# Patient Record
Sex: Male | Born: 1960
Health system: Southern US, Community
[De-identification: ages and names within clinical notes are randomized; demographics above are authoritative.]

## PROBLEM LIST (undated history)

## (undated) DIAGNOSIS — R7303 Prediabetes: Secondary | ICD-10-CM

## (undated) DIAGNOSIS — G4730 Sleep apnea, unspecified: Secondary | ICD-10-CM

## (undated) DIAGNOSIS — K219 Gastro-esophageal reflux disease without esophagitis: Secondary | ICD-10-CM

## (undated) DIAGNOSIS — Z9889 Other specified postprocedural states: Secondary | ICD-10-CM

## (undated) DIAGNOSIS — E785 Hyperlipidemia, unspecified: Secondary | ICD-10-CM

## (undated) DIAGNOSIS — R600 Localized edema: Secondary | ICD-10-CM

## (undated) DIAGNOSIS — I4892 Unspecified atrial flutter: Secondary | ICD-10-CM

## (undated) DIAGNOSIS — F419 Anxiety disorder, unspecified: Secondary | ICD-10-CM

## (undated) DIAGNOSIS — Z8679 Personal history of other diseases of the circulatory system: Secondary | ICD-10-CM

## (undated) DIAGNOSIS — Z8774 Personal history of (corrected) congenital malformations of heart and circulatory system: Secondary | ICD-10-CM

## (undated) DIAGNOSIS — K5900 Constipation, unspecified: Secondary | ICD-10-CM

## (undated) DIAGNOSIS — E039 Hypothyroidism, unspecified: Secondary | ICD-10-CM

## (undated) DIAGNOSIS — E669 Obesity, unspecified: Secondary | ICD-10-CM

## (undated) DIAGNOSIS — I4819 Other persistent atrial fibrillation: Secondary | ICD-10-CM

## (undated) DIAGNOSIS — J310 Chronic rhinitis: Secondary | ICD-10-CM

## (undated) DIAGNOSIS — F329 Major depressive disorder, single episode, unspecified: Secondary | ICD-10-CM

## (undated) DIAGNOSIS — F32A Depression, unspecified: Secondary | ICD-10-CM

## (undated) DIAGNOSIS — K760 Fatty (change of) liver, not elsewhere classified: Secondary | ICD-10-CM

## (undated) HISTORY — PX: NASAL SINUS SURGERY: SHX719

## (undated) HISTORY — DX: Other persistent atrial fibrillation: I48.19

## (undated) HISTORY — DX: Sleep apnea, unspecified: G47.30

## (undated) HISTORY — DX: Unspecified atrial flutter: I48.92

## (undated) HISTORY — DX: Chronic rhinitis: J31.0

## (undated) HISTORY — DX: Gastro-esophageal reflux disease without esophagitis: K21.9

## (undated) HISTORY — DX: Major depressive disorder, single episode, unspecified: F32.9

## (undated) HISTORY — DX: Obesity, unspecified: E66.9

## (undated) HISTORY — DX: Anxiety disorder, unspecified: F41.9

## (undated) HISTORY — DX: Hyperlipidemia, unspecified: E78.5

## (undated) HISTORY — DX: Constipation, unspecified: K59.00

## (undated) HISTORY — DX: Hypothyroidism, unspecified: E03.9

## (undated) HISTORY — PX: VASECTOMY: SHX75

## (undated) HISTORY — DX: Depression, unspecified: F32.A

## (undated) HISTORY — PX: TONSILLECTOMY: SUR1361

## (undated) HISTORY — DX: Prediabetes: R73.03

## (undated) HISTORY — PX: WISDOM TOOTH EXTRACTION: SHX21

## (undated) HISTORY — DX: Localized edema: R60.0

## (undated) HISTORY — DX: Fatty (change of) liver, not elsewhere classified: K76.0

---

## 2002-07-25 ENCOUNTER — Encounter: Payer: Self-pay | Admitting: Internal Medicine

## 2002-07-25 ENCOUNTER — Encounter: Admission: RE | Admit: 2002-07-25 | Discharge: 2002-07-25 | Payer: Self-pay | Admitting: Internal Medicine

## 2004-07-15 ENCOUNTER — Ambulatory Visit: Payer: Self-pay | Admitting: Otolaryngology

## 2004-10-19 ENCOUNTER — Ambulatory Visit: Payer: Self-pay | Admitting: Internal Medicine

## 2005-01-20 ENCOUNTER — Ambulatory Visit: Payer: Self-pay | Admitting: Internal Medicine

## 2005-07-22 ENCOUNTER — Ambulatory Visit: Payer: Self-pay | Admitting: Internal Medicine

## 2005-08-30 ENCOUNTER — Ambulatory Visit: Payer: Self-pay | Admitting: Gastroenterology

## 2005-09-10 ENCOUNTER — Ambulatory Visit: Payer: Self-pay | Admitting: Gastroenterology

## 2005-09-10 LAB — HM COLONOSCOPY

## 2006-01-20 ENCOUNTER — Ambulatory Visit: Payer: Self-pay | Admitting: Internal Medicine

## 2006-07-01 ENCOUNTER — Ambulatory Visit: Payer: Self-pay | Admitting: Internal Medicine

## 2006-07-01 ENCOUNTER — Inpatient Hospital Stay (HOSPITAL_COMMUNITY): Admission: EM | Admit: 2006-07-01 | Discharge: 2006-07-02 | Payer: Self-pay | Admitting: Emergency Medicine

## 2006-07-06 ENCOUNTER — Other Ambulatory Visit (HOSPITAL_COMMUNITY): Admission: RE | Admit: 2006-07-06 | Discharge: 2006-10-04 | Payer: Self-pay | Admitting: Psychiatry

## 2006-07-06 ENCOUNTER — Ambulatory Visit: Payer: Self-pay | Admitting: Psychiatry

## 2006-07-18 ENCOUNTER — Ambulatory Visit: Payer: Self-pay | Admitting: Internal Medicine

## 2007-02-21 ENCOUNTER — Encounter (INDEPENDENT_AMBULATORY_CARE_PROVIDER_SITE_OTHER): Payer: Self-pay | Admitting: *Deleted

## 2007-04-25 ENCOUNTER — Ambulatory Visit: Payer: Self-pay | Admitting: Internal Medicine

## 2007-04-25 DIAGNOSIS — Z8659 Personal history of other mental and behavioral disorders: Secondary | ICD-10-CM

## 2007-04-25 DIAGNOSIS — E785 Hyperlipidemia, unspecified: Secondary | ICD-10-CM

## 2007-04-25 DIAGNOSIS — M109 Gout, unspecified: Secondary | ICD-10-CM

## 2007-04-25 DIAGNOSIS — E039 Hypothyroidism, unspecified: Secondary | ICD-10-CM

## 2007-08-09 ENCOUNTER — Ambulatory Visit: Payer: Self-pay | Admitting: Internal Medicine

## 2007-08-09 DIAGNOSIS — E049 Nontoxic goiter, unspecified: Secondary | ICD-10-CM | POA: Insufficient documentation

## 2007-08-28 ENCOUNTER — Encounter: Admission: RE | Admit: 2007-08-28 | Discharge: 2007-08-28 | Payer: Self-pay | Admitting: Internal Medicine

## 2007-10-13 ENCOUNTER — Telehealth (INDEPENDENT_AMBULATORY_CARE_PROVIDER_SITE_OTHER): Payer: Self-pay | Admitting: *Deleted

## 2008-10-11 ENCOUNTER — Ambulatory Visit: Payer: Self-pay | Admitting: Internal Medicine

## 2008-10-15 ENCOUNTER — Telehealth (INDEPENDENT_AMBULATORY_CARE_PROVIDER_SITE_OTHER): Payer: Self-pay | Admitting: *Deleted

## 2008-10-16 ENCOUNTER — Encounter: Payer: Self-pay | Admitting: Internal Medicine

## 2008-10-18 ENCOUNTER — Encounter: Payer: Self-pay | Admitting: Internal Medicine

## 2008-10-18 LAB — CONVERTED CEMR LAB
ALT: 67 units/L
AST: 38 units/L
Albumin: 5.1 g/dL
Alkaline Phosphatase: 84 units/L
BUN: 19 mg/dL
Calcium: 10.1 mg/dL
Chloride, Serum: 101 mmol/L
Cholesterol: 232 mg/dL
Creatinine, Ser: 0.99 mg/dL
Glucose, Bld: 116 mg/dL
HDL: 42 mg/dL
LDL Cholesterol: 140 mg/dL
Potassium, serum: 4.5 mmol/L
Sodium, serum: 140 mmol/L
TSH: 18.91 microintl units/mL
Total Bilirubin: 0.8 mg/dL
Total Protein: 7.1 g/dL
Triglycerides: 251 mg/dL
Uric Acid, Serum: 7.5 mg/dL

## 2008-10-21 ENCOUNTER — Encounter: Payer: Self-pay | Admitting: Internal Medicine

## 2009-03-28 ENCOUNTER — Telehealth (INDEPENDENT_AMBULATORY_CARE_PROVIDER_SITE_OTHER): Payer: Self-pay | Admitting: *Deleted

## 2009-03-28 DIAGNOSIS — E119 Type 2 diabetes mellitus without complications: Secondary | ICD-10-CM

## 2009-04-01 ENCOUNTER — Encounter: Payer: Self-pay | Admitting: Internal Medicine

## 2009-04-04 ENCOUNTER — Encounter: Payer: Self-pay | Admitting: Internal Medicine

## 2009-04-08 ENCOUNTER — Ambulatory Visit: Payer: Self-pay | Admitting: Internal Medicine

## 2009-04-08 DIAGNOSIS — K219 Gastro-esophageal reflux disease without esophagitis: Secondary | ICD-10-CM

## 2009-04-11 ENCOUNTER — Encounter: Payer: Self-pay | Admitting: Internal Medicine

## 2009-04-21 ENCOUNTER — Encounter: Payer: Self-pay | Admitting: Internal Medicine

## 2010-04-28 ENCOUNTER — Ambulatory Visit: Payer: Self-pay | Admitting: Internal Medicine

## 2010-06-04 ENCOUNTER — Telehealth: Payer: Self-pay | Admitting: Internal Medicine

## 2010-06-05 ENCOUNTER — Encounter: Payer: Self-pay | Admitting: Internal Medicine

## 2010-06-15 ENCOUNTER — Telehealth: Payer: Self-pay | Admitting: Internal Medicine

## 2010-06-26 ENCOUNTER — Encounter: Payer: Self-pay | Admitting: Internal Medicine

## 2010-06-26 LAB — CONVERTED CEMR LAB
ALT: 31 units/L
AST: 16 units/L
Hgb A1c MFr Bld: 10.6 %
Potassium, serum: 3.7 mmol/L
Triglycerides: 936 mg/dL

## 2010-06-28 LAB — CONVERTED CEMR LAB
AST: 30 units/L
BUN: 16 mg/dL
Calcium: 9.9 mg/dL
Cholesterol: 223 mg/dL
HCT: 45.3 %
Hgb A1c MFr Bld: 7.5 %
LDL Cholesterol: 118 mg/dL
MCV: 88 fL
Platelets: 185 10*3/uL
Sodium, serum: 139 mmol/L
TSH: 5.97 microintl units/mL
Total Protein: 7 g/dL
Triglycerides: 333 mg/dL

## 2010-06-30 NOTE — Assessment & Plan Note (Signed)
Summary: cpx//lch- resd cbs   Vital Signs:  Patient profile:   50 year old male Height:      73.5 inches Weight:      258.13 pounds BMI:     33.72 Pulse rate:   92 / minute Pulse rhythm:   regular BP sitting:   124 / 84  (left arm) Cuff size:   large  Vitals Entered By: [name] (April 28, 2010 9:22 AM) CC: CPX, not fasting  Comments [facility] [address]   History of Present Illness: CPX    Preventive Screening-Counseling & Management  Caffeine-Diet-Exercise     Does Patient Exercise: yes     Type of exercise: some  Current Medications (verified): 1)  Levothyroxine Sodium 200 Mcg Solr (Levothyroxine Sodium) .[name].. 1 By Mouth Once Daily 2)  Allopurinol 300 Mg Tabs (Allopurinol) .[name].. 1 By Mouth Qd 3)  Nexium 40 Mg Cpdr (Esomeprazole Magnesium) .[name].. 1 By Mouth Once Daily 4)  Flonase 50 Mcg/act Susp (Fluticasone Propionate) .... 2 Puffs Once Daily  Allergies (verified): No Known Drug Allergies  Past History:  Past Medical History: dx w/  BIPOLAR DISORDER at some point , used to see psych, on no meds as of 03-2009 RHINITIS--vasomotor HYPOTHYROIDISM  HYPERLIPIDEMIA   GOUT   GERD  Past Surgical History: Reviewed history from 04/25/2007 and no changes required. Tonsillectomy Vasectomy sinus surgery  Family History: Reviewed history from 08/09/2007 and no changes required. CAD - no DM - F, F family HTN  - bro stroke - no colon Ca - PGF prostate Ca - no melanoma - bro throat Ca - M. Uncle asthma/allergies - daughter  Social History: Married 2 kids works at [employer] unchanged, needs improvment tobacco-- never ETOH-- never Does Patient Exercise:  yes  Review of Systems General:  some fatigue, hair thinner, nails not growing as fast: check testosterone? admits to a lot of stress at work but still enjoys going to work. CV:  Denies chest pain or discomfort and swelling of feet. Resp:  Denies shortness of breath; occasionally cough,  thinks related to GERD -PN drip . GI:  Denies bloody stools, nausea, and vomiting; GERD symptoms ok , they are the best when he loss wt. GU:  Denies dysuria, hematuria, urinary frequency, and urinary hesitancy; no ED. Psych:  Denies anxiety and depression. Endo:  Denies excessive hunger, excessive thirst, and excessive urination.  Physical Exam  General:  alert, well-developed, and overweight-appearing.   Neck:  no masses, R  thyrod slt larger?, no nodules, and normal carotid upstroke.   Lungs:  normal respiratory effort, no intercostal retractions, no accessory muscle use, and normal breath sounds.   Heart:  normal rate, regular rhythm, and no murmur.   Abdomen:  soft, non-tender, no distention, no masses, no guarding, and no rigidity.   Rectal:  + ext henmorrhoid x 2,  Normal sphincter tone. No rectal masses or tenderness. Prostate:  Prostate gland firm and smooth, no enlargement, nodularity, tenderness, mass; slightly  larger on the L? Extremities:  no pretibial edema bilaterally  Psych:  Oriented X3, memory intact for recent and remote, normally interactive, good eye contact, not anxious appearing, and not depressed appearing.     Impression & Recommendations:  Problem # 1:  HEALTH SCREENING (ICD-V70.0) TD 09  got a flu shot  Cscope 08-2005: int.  hemorrhoids, next 2017 I will rec iBOBs starting 2012  diet -exercise diiscussed and encourage prostate slightly asymmetric? Recommend to come back in 6 months to recheck.  PSA pending. Labs likes a testosterone check, will do. Patient aware that a low testosterone level may not correlate with some of his concerns patient had some weight loss, we'll check his TSH and A1c  Problem # 2:  DIABETES MELLITUS, TYPE II (ICD-250.00) patient has diabetes will refer to nutritionist I recommended him to come back every 4 months   Labs Reviewed: Creat: 0.88 (04/01/2009)    Reviewed HgBA1c results: 7.5 (04/01/2009)  Orders: Nutrition  Referral (Nutrition)  Problem # 3:  HYPOTHYROIDISM (ICD-244.9) labs His updated medication list for this problem includes:    Levothyroxine Sodium 200 Mcg Solr (Levothyroxine sodium) .[name]... 1 by mouth once daily  Labs Reviewed: TSH: 5.970 (04/01/2009)    HgBA1c: 7.5 (04/01/2009) Chol: 223 (04/01/2009)   HDL: 38 (04/01/2009)   LDL: 118 (04/01/2009)   TG: 333 (04/01/2009)  Problem # 4:  HYPERLIPIDEMIA (ICD-272.4) labs Labs Reviewed: SGOT: 30 (04/01/2009)   SGPT: 35 (04/01/2009)   HDL:38 (04/01/2009), 42 (10/18/2008)  LDL:118 (04/01/2009), 140 (10/18/2008)  Chol:223 (04/01/2009), 232 (10/18/2008)  Trig:333 (04/01/2009), 251 (10/18/2008)  Complete Medication List: 1)  Levothyroxine Sodium 200 Mcg Solr (Levothyroxine sodium) .[name].. 1 by mouth once daily 2)  Allopurinol 300 Mg Tabs (Allopurinol) .[name].. 1 by mouth qd 3)  Nexium 40 Mg Cpdr (Esomeprazole magnesium) .[name].. 1 by mouth once daily 4)  Flonase 50 Mcg/act Susp (Fluticasone propionate) .... 2 puffs once daily 5)  Fasting Labs  .... Flp, lfts, hemoglobin a1c, microalbumin, bmp, tsh, psa, cbc, total testosterone dx v70  Patient Instructions: 1)  Please schedule a follow-up appointment in 4 months .  Prescriptions: FLONASE 50 MCG/ACT SUSP (FLUTICASONE PROPIONATE) 2 puffs once daily  #3 month x 1   Entered and Authorized by:   Elita Quick E. Paz MD   Signed by:   Nolon Rod. Paz MD on 04/28/2010   Method used:   Print then Give to Patient   RxID:   [id number] NEXIUM 40 MG CPDR (ESOMEPRAZOLE MAGNESIUM) 1 by mouth once daily  #90 x 1   Entered and Authorized by:   Elita Quick E. Paz MD   Signed by:   Nolon Rod. Paz MD on 04/28/2010   Method used:   Print then Give to Patient   RxID:   [id number] ALLOPURINOL 300 MG TABS (ALLOPURINOL) 1 by mouth QD  #90 x 1   Entered and Authorized by:   Nolon Rod. Paz MD   Signed by:   Nolon Rod. Paz MD on 04/28/2010   Method used:   Print then Give to Patient   RxID:   [id number] LEVOTHYROXINE SODIUM 200 MCG  SOLR (LEVOTHYROXINE SODIUM) 1 by mouth once daily  #90 x 1   Entered and Authorized by:   Elita Quick E. Paz MD   Signed by:   Nolon Rod. Paz MD on 04/28/2010   Method used:   Print then Give to Patient   RxID:   [id number] FASTING LABS FLP, LFTs, hemoglobin A1c, microalbumin, BMP, TSH, PSA, CBC, total testosterone dx v70  #0 x 0   Entered and Authorized by:   Nolon Rod. Paz MD   Signed by:   Nolon Rod. Paz MD on 04/28/2010   Method used:   Print then Give to Patient   RxID:   [id number]    Orders Added: 1)  Nutrition Referral [Nutrition] 2)  Est. Patient age 78-64 [99396]   Immunization History:  Influenza Immunization History:    Influenza:  historical (03/28/2010)   Immunization History:  Influenza Immunization History:  Influenza:  Historical (03/28/2010)   Risk Factors:  Exercise:  yes    Type:  some

## 2010-07-02 NOTE — Progress Notes (Signed)
Summary: Mail order running late  Phone Note Refill Request Message from:  Patient on June 15, 2010 3:30 PM  Refills Requested: Medication #1:  ALLOPURINOL 300 MG TABS 1 by mouth QD  Medication #2:  LEVOTHYROXINE SODIUM 200 MCG SOLR 1 by mouth once daily Patient notes that he is about to run out and mail order will not arrive.  Initial call taken by: [name],  June 15, 2010 3:30 PM    Prescriptions: ALLOPURINOL 300 MG TABS (ALLOPURINOL) 1 by mouth QD  #30 x 0   Entered by:   [name]   Authorized by:   Nolon Rod. Whitlee Sluder MD   Signed by:   [name] on 06/15/2010   Method used:   Electronically to        [facility] [phone]* (retail)       [address]       [place], [place]  [address]       Ph: [phone]       Fax: [phone]   RxID:   [id number] LEVOTHYROXINE SODIUM 200 MCG SOLR (LEVOTHYROXINE SODIUM) 1 by mouth once daily  #30 x 0   Entered by:   [name]   Authorized by:   Nolon Rod. Radiah Lubinski MD   Signed by:   [name] on 06/15/2010   Method used:   Electronically to        [facility] [phone]* (retail)       [address]       [place], [place]  [address]       Ph: [phone]       Fax: [phone]   RxID:   [id number]

## 2010-07-02 NOTE — Progress Notes (Signed)
Summary: needs labs   Phone Note Other Incoming   Summary of Call: I left message for pt to call back-- he needs to have an FLP, BMP, PSA, CBC.  Initial call taken by: [name],  June 15, 2010 4:35 PM  Follow-up for Phone Call        left message for pt to call back. [name]  June 16, 2010 10:37 AM   Additional Follow-up for Phone Call Additional follow up Details #1::        Pt would like to know if a Vitamin D could be added?  Additional Follow-up by: [name],  June 16, 2010 2:33 PM    Additional Follow-up for Phone Call Additional follow up Details #2::    yes. Afia Messenger E. Amandy Chubbuck MD  June 16, 2010 3:09 PM   Additional Follow-up for Phone Call Additional follow up Details #3:: Details for Additional Follow-up Action Taken: pt is aware that rx is upfront. [name]  June 16, 2010 3:58 PM   New/Updated Medications: * FASTING LABS FLP, BMP, PSA, CBC, Vitamin D-- v70.0 Prescriptions: FASTING LABS FLP, BMP, PSA, CBC, Vitamin D-- v70.0  #0 x 0   Entered by:   [name]   Authorized by:   Nolon Rod. Lauraine Crespo MD   Signed by:   [name] on 06/16/2010   Method used:   Print then Give to Patient   RxID:   [id number]

## 2010-07-02 NOTE — Progress Notes (Signed)
Summary: NEED LABS  Phone Note Outgoing Call   Summary of Call: had CPX 04-28-10, was supposed to send me his labs but we have not received them. advise patient: needs labs a Rx 04-28-10 Sanyla Summey E. Elen Acero MD  June 04, 2010 8:37 PM   Follow-up for Phone Call        I spoke w/ pt he is going to send them to [place]. [name]  June 05, 2010 9:28 AM

## 2010-07-09 ENCOUNTER — Encounter: Payer: Self-pay | Admitting: Internal Medicine

## 2010-07-09 ENCOUNTER — Telehealth: Payer: Self-pay | Admitting: Internal Medicine

## 2010-07-09 LAB — CONVERTED CEMR LAB: Vit D, 25-Hydroxy: 10.3 ng/mL

## 2010-07-10 ENCOUNTER — Telehealth: Payer: Self-pay | Admitting: Internal Medicine

## 2010-07-11 ENCOUNTER — Encounter: Payer: Self-pay | Admitting: Internal Medicine

## 2010-07-16 NOTE — Progress Notes (Signed)
Summary: start metformin, Amaryl, Antara  Phone Note Outgoing Call   Summary of Call: advise patient: His diabetes is much worse than before, the hemoglobin A1c is 10.6. He is triglycerides are extremely high at 956. TSH and testosterone slightly abnormal====>  recheck in few  months Plan: See the nutritionist as recommended Start Antara 1 by mouth once daily (for  triglycerides), #30 1 RF, we can give him samples and a coupon start Amaryl 2  mg one p.o. daily, #30 and 1 RF metformin 500 mg one p.o. b.i.d., #60 and one refill came  back to the office to get a glucometer, check blood sugars once daily at different times. office visit with me in one month Jovi Alvizo E. Jaque Dacy MD  July 09, 2010 3:45 PM    Follow-up for Phone Call        I spoke w/ pt he is aware of everything. Has a pending appt. Place samples and glucometer upfront for him to pick up and copy of labs. [name]  July 09, 2010 4:02 PM     New/Updated Medications: LEVOTHYROXINE SODIUM 200 MCG SOLR 1 by mouth once daily, ALLOPURINOL 300 MG TABS 1 by mouth QD, NEXIUM 40 MG CPDR 1 by mouth once daily, FLONASE 50 MCG/ACT SUSP 2 puffs once daily, FASTING LABS FLP, BMP, PSA, CBC, Vitamin D-- v70.0, AMARYL 2 MG TABS 1 by mouth daily., METFORMIN HCL 500 MG TABS 1 by mouth two times a day., ANTARA 130 MG CAPS 1 by mouth daily., ONETOUCH ULTRA BLUE  STRP once daily., ONETOUCH ULTRASOFT LANCETS  MISC once daily.[name]  LEVOTHYROXINE SODIUM 125 MCG TABS (LEVOTHYROXINE SODIUM) 1 by mouth QD UNITHROID 125 MCG  TABS (LEVOTHYROXINE SODIUM) 1 by mouth qd LEVOTHYROXINE SODIUM 200 MCG SOLR (LEVOTHYROXINE SODIUM) 1 by mouth once daily LEVOTHYROXINE SODIUM 175 MCG TABS (LEVOTHYROXINE SODIUM) 1 by mouth once daily GENTAMICIN SULFATE 0.3 % OINT (GENTAMICIN SULFATE)  GENTAMICIN SULFATE 0.3 % OINT (GENTAMICIN SULFATE)  ALLOPURINOL 300 MG TABS (ALLOPURINOL) 1 by mouth QD DEPAKOTE ER 500 MG  TB24 (DIVALPROEX SODIUM) 6 by mouth  qhs DEPAKOTE ER 500 MG  TB24 (DIVALPROEX SODIUM) 6 by mouth qhs LAMICTAL 100 MG  TABS (LAMOTRIGINE) 1 by mouth qhs LAMICTAL 100 MG  TABS (LAMOTRIGINE) 1 by mouth qhs * JOINT SOOTHER VITAMIN  * JOINT SOOTHER VITAMIN  AMOXICILLIN 500 MG  TABS (AMOXICILLIN) 2 by mouth two times a day x 7 days AMOXICILLIN 500 MG  TABS (AMOXICILLIN) 2 by mouth two times a day x 7 days * LAB WORK BMP, LFT, URIC ACID, LIPID, TSH DX V70.0, 244.9, 274.9 * LAB WORK BMP, LFT, URIC ACID, LIPID, TSH DX V70.0, 244.9, 274.9 ACIPHEX 20 MG TBEC (RABEPRAZOLE SODIUM) 1 by mouth daily before breakfast NEXIUM 40 MG CPDR (ESOMEPRAZOLE MAGNESIUM) 1 by mouth once daily FLONASE 50 MCG/ACT SUSP (FLUTICASONE PROPIONATE) 2 puffs once daily AMOXICILLIN 500 MG CAPS (AMOXICILLIN) 2 by mouth two times a day AMOXICILLIN 500 MG CAPS (AMOXICILLIN) 2 by mouth two times a day * TSH CHECK IN 6 WEEKS Dx hypothyroidism * TSH CHECK IN 6 WEEKS Dx hypothyroidism * FASTING LABS FLP, LFTs, hemoglobin A1c, microalbumin, BMP, TSH, PSA, CBC, total testosterone dx v70 * FASTING LABS FLP, BMP, PSA, CBC, Vitamin D-- v70.0 AMARYL 2 MG TABS (GLIMEPIRIDE) 1 by mouth daily. METFORMIN HCL 500 MG TABS (METFORMIN HCL) 1 by mouth two times a day. ANTARA 130 MG CAPS (FENOFIBRATE MICRONIZED) 1 by mouth daily. ONETOUCH ULTRA BLUE  STRP (GLUCOSE  BLOOD) once daily. ONETOUCH ULTRASOFT LANCETS  MISC (LANCETS) once daily. Prescriptions: ONETOUCH ULTRASOFT LANCETS  MISC (LANCETS) once daily.  #1 month x 11   Entered by:   [name]   Authorized by:   Nolon Rod. Shacara Cozine MD   Signed by:   [name] on 07/09/2010   Method used:   Electronically to        [facility] [phone]* (retail)       [address]       [place], [place]  [address]       Ph: [phone]       Fax: [phone]   RxID:   [id number] ONETOUCH ULTRA BLUE  STRP (GLUCOSE BLOOD) once daily.  #1 month x 11   Entered by:   [name]   Authorized  by:   Nolon Rod. Kingsley Herandez MD   Signed by:   [name] on 07/09/2010   Method used:   Electronically to        [facility] [phone]* (retail)       [address]       [place], [place]  [address]       Ph: [phone]       Fax: [phone]   RxID:   [id number] ANTARA 130 MG CAPS (FENOFIBRATE MICRONIZED) 1 by mouth daily.  #30 x 1   Entered by:   [name]   Authorized by:   Nolon Rod. Montia Haslip MD   Signed by:   [name] on 07/09/2010   Method used:   Electronically to        [facility] [phone]* (retail)       [address]       [place], [place]  [address]       Ph: [phone]       Fax: [phone]   RxID:   [id number] METFORMIN HCL 500 MG TABS (METFORMIN HCL) 1 by mouth two times a day.  #60 x 1   Entered by:   [name]   Authorized by:   Nolon Rod. Gaynelle Pastrana MD   Signed by:   [name] on 07/09/2010   Method used:   Electronically to        [facility] [phone]* (retail)       [address]       [place], [place]  [address]       Ph: [phone]       Fax: [phone]   RxID:   [id number] AMARYL 2 MG TABS (GLIMEPIRIDE) 1 by mouth daily.  #30 x 1   Entered by:   [name]   Authorized by:   Nolon Rod. Jaidence Geisler MD   Signed by:   [name] on 07/09/2010   Method used:   Electronically to        [facility]  [facility] [phone]* (retail)       [address]       [place], [place]  [address]       Ph: [phone]       Fax: [phone]   RxID:   [id number]

## 2010-07-16 NOTE — Progress Notes (Signed)
Summary: Prior Auth--Antara  Phone Note Refill Request Call back at [phone] Message from:  Pharmacy on July 10, 2010 9:35 AM  Refills Requested: Medication #1:  ANTARA 130 MG CAPS 1 by mouth daily.   Dosage confirmed as above?Dosage Confirmed [facility] [address]  Initial call taken by: Harold Barban,  July 10, 2010 9:35 AM  Follow-up for Phone Call        Ph # [phone] option 3. I spoke with insurance company and preferred med is generic fenofibrate. Patient must have tried that med prior to initiating prior authorization. Please advise. [name]  July 10, 2010 10:31 AM   Additional Follow-up for Phone Call Additional follow up Details #1::        okay, I deleted antara and put a new prescription for fenofibrate. let patient know Marisela Line E. Travaris Kosh MD  July 10, 2010 10:41 AM   Patient spouse notified. [name]  July 10, 2010 10:48 AM     New/Updated Medications: FENOFIBRATE 160 MG TABS (FENOFIBRATE) 1 by mouth once daily Prescriptions: FENOFIBRATE 160 MG TABS (FENOFIBRATE) 1 by mouth once daily  #90 x 1   Entered and Authorized by:   Nolon Rod. Rosbel Buckner MD   Signed by:   [name] on 07/10/2010   Method used:   Electronically to        [facility] [phone]* (retail)       [address]       [place], [place]  [address]       Ph: [phone]       Fax: [phone]   RxID:   [id number]

## 2010-07-29 ENCOUNTER — Telehealth (INDEPENDENT_AMBULATORY_CARE_PROVIDER_SITE_OTHER): Payer: Self-pay | Admitting: *Deleted

## 2010-07-30 ENCOUNTER — Telehealth (INDEPENDENT_AMBULATORY_CARE_PROVIDER_SITE_OTHER): Payer: Self-pay | Admitting: *Deleted

## 2010-08-03 ENCOUNTER — Encounter: Payer: 59 | Attending: Internal Medicine

## 2010-08-03 DIAGNOSIS — E119 Type 2 diabetes mellitus without complications: Secondary | ICD-10-CM | POA: Insufficient documentation

## 2010-08-03 DIAGNOSIS — Z713 Dietary counseling and surveillance: Secondary | ICD-10-CM | POA: Insufficient documentation

## 2010-08-05 ENCOUNTER — Ambulatory Visit (INDEPENDENT_AMBULATORY_CARE_PROVIDER_SITE_OTHER): Payer: 59 | Admitting: Internal Medicine

## 2010-08-05 ENCOUNTER — Encounter: Payer: Self-pay | Admitting: Internal Medicine

## 2010-08-05 DIAGNOSIS — E785 Hyperlipidemia, unspecified: Secondary | ICD-10-CM

## 2010-08-05 DIAGNOSIS — E119 Type 2 diabetes mellitus without complications: Secondary | ICD-10-CM

## 2010-08-05 DIAGNOSIS — E039 Hypothyroidism, unspecified: Secondary | ICD-10-CM

## 2010-08-06 NOTE — Progress Notes (Signed)
Summary: 90 day for meds   Phone Note Refill Request Message from:  Patient on July 30, 2010 8:55 AM  Refills Requested: Medication #1:  AMARYL 2 MG TABS 1 by mouth daily.  Medication #2:  FENOFIBRATE 160 MG TABS 1 by mouth once daily.  Medication #3:  METFORMIN HCL 500 MG TABS 1 by mouth two times a day. needs 90-day supply and will stop by to pick up prescriptions  Initial call taken by: Doristine Devoid CMA,  July 30, 2010 8:56 AM    Prescriptions: FENOFIBRATE 160 MG TABS (FENOFIBRATE) 1 by mouth once daily  #90 x 1   Entered by:   Doristine Devoid CMA   Authorized by:   Nolon Rod. Paz MD   Signed by:   Doristine Devoid CMA on 07/30/2010   Method used:   Print then Give to Patient   RxID:   [id number] METFORMIN HCL 500 MG TABS (METFORMIN HCL) 1 by mouth two times a day.  #180 x 0   Entered by:   Doristine Devoid CMA   Authorized by:   Nolon Rod. Paz MD   Signed by:   Doristine Devoid CMA on 07/30/2010   Method used:   Print then Give to Patient   RxID:   [id number] AMARYL 2 MG TABS (GLIMEPIRIDE) 1 by mouth daily.  #90 x 0   Entered by:   Doristine Devoid CMA   Authorized by:   Nolon Rod. Paz MD   Signed by:   Doristine Devoid CMA on 07/30/2010   Method used:   Print then Give to Patient   RxID:   [id number]

## 2010-08-06 NOTE — Progress Notes (Signed)
Summary: rx (lmom 2/29)   Phone Note Refill Request   Refills Requested: Medication #1:  antara 130 mg take 1 tablet daily by mouth   Dosage confirmed as above?Dosage Confirmed   Supply Requested: 3 months  Medication #2:  METFORMIN HCL 500 MG TABS 1 by mouth two times a day.   Dosage confirmed as above?Dosage Confirmed   Supply Requested: 3 months  Medication #3:  glimepiride 2 mg   Dosage confirmed as above?Dosage Confirmed   Supply Requested: 3 months  Medication #4:  FENOFIBRATE 160 MG TABS 1 by mouth once daily.   Dosage confirmed as above?Dosage Confirmed   Supply Requested: 3 months oneetouch ultra blood glucose test strips-- Need a 30 day supply now and then a 90 day suppy. patient will pick up.   Follow-up for Phone Call        Left message for pt to call back-- need to clairfy meds- pt should not be on both antara and fenofibrate. [name]  July 29, 2010 8:40 AM   Additional Follow-up for Phone Call Additional follow up Details #1::        Patient called back stating that he has been taking both of the above meds. [name] is not on med list, but patient says it was given by Dr. Drue Novel. [name]  July 29, 2010 8:46 AM     Additional Follow-up for Phone Call Additional follow up Details #2::    I spoke w/ pt made pt aware of Insurance company not covering his Anatara, sent in Meformin and Amarly for 3 months. Made pt aware Dr.Paz wants to see him in the next week or 2. [name]  July 29, 2010 8:50 AM   Prescriptions: Letta Pate ULTRASOFT LANCETS  MISC (LANCETS) once daily.  #1 month x 10   Entered by:   [name]   Authorized by:   Nolon Rod. Paz MD   Signed by:   [name] on 07/29/2010   Method used:   Electronically to        [facility] [phone]* (retail)       [address]       [place], [place]  [address]       Ph: [phone]       Fax: [phone]   RxID:    [id number] ONETOUCH ULTRA BLUE  STRP (GLUCOSE BLOOD) once daily.  #1 month x 10   Entered by:   [name]   Authorized by:   Nolon Rod. Paz MD   Signed by:   [name] on 07/29/2010   Method used:   Electronically to        [facility]S  [facility] [phone]* (retail)       [address]       [place], [place]  [address]       Ph: [phone]       Fax: [phone]   RxID:   [id number] METFORMIN HCL 500 MG TABS (METFORMIN HCL) 1 by mouth two times a day.  #180 x 0   Entered by:   [name]   Authorized by:   Nolon Rod. Paz MD   Signed by:   [name] on 07/29/2010   Method used:   Electronically to        [facility]S  [facility] [phone]* (retail)  [address]       [place], [place]  [address]       Ph: [phone]       Fax: [phone]   RxID:   [id number] AMARYL 2 MG TABS (GLIMEPIRIDE) 1 by mouth daily.  #90 x 0   Entered by:   [name]   Authorized by:   Nolon Rod. Paz MD   Signed by:   [name] on 07/29/2010   Method used:   Electronically to        [facility]S  [facility] [phone]* (retail)       [address]       [place], [place]  [address]       Ph: [phone]       Fax: [phone]   RxID:   [id number]

## 2010-08-11 NOTE — Assessment & Plan Note (Signed)
Summary: 4 MONTH ROV/KN   Vital Signs:  Patient profile:   50 year old male Weight:      263.25 pounds Pulse rate:   75 / minute Pulse rhythm:   regular BP sitting:   116 / 88  (left arm) Cuff size:   large  Vitals Entered By: [name] (August 05, 2010 10:20 AM) CC: 4 month f/u on DM Comments BS running high  not fasting  [facility]    History of Present Illness:   here for a routine visit  recent labs reviewed    ROS He went to the first diabetes class 3 days ago,  it has helped a lot.  good compliance with medication.  his CBGs remain in the 200 except for the last couple of days when she started changing his diet , CBGs has been around 90.  denies nausea, vomiting, diarrhea   good thyroid medication compliance  Current Medications (verified): 1)  Levothyroxine Sodium 200 Mcg Solr (Levothyroxine Sodium) .[name].. 1 By Mouth Once Daily 2)  Allopurinol 300 Mg Tabs (Allopurinol) .[name].. 1 By Mouth Qd 3)  Nexium 40 Mg Cpdr (Esomeprazole Magnesium) .[name].. 1 By Mouth Once Daily 4)  Flonase 50 Mcg/act Susp (Fluticasone Propionate) .... 2 Puffs Once Daily 5)  Fasting Labs .... Flp, Bmp, Psa, Cbc, Vitamin D-- V70.0 6)  Amaryl 2 Mg Tabs (Glimepiride) .[name].. 1 By Mouth Daily. 7)  Metformin Hcl 500 Mg Tabs (Metformin Hcl) .[name].. 1 By Mouth Two Times A Day. 8)  Onetouch Ultra Blue  Strp (Glucose Blood) .... Once Daily. 9)  Onetouch Ultrasoft Lancets  Misc (Lancets) .... Once Daily. 10)  Fenofibrate 160 Mg Tabs (Fenofibrate) .[name].. 1 By Mouth Once Daily  Allergies (verified): No Known Drug Allergies  Past History:  Past Medical History: Reviewed history from 04/28/2010 and no changes required. dx w/  BIPOLAR DISORDER at some point , used to see psych, on no meds as of 03-2009 RHINITIS--vasomotor HYPOTHYROIDISM  HYPERLIPIDEMIA   GOUT   GERD  Past Surgical History: Reviewed history from 04/25/2007 and no changes required. Tonsillectomy Vasectomy sinus surgery  Social  History: Reviewed history from 04/28/2010 and no changes required. Married 2 kids works at [employer] unchanged, needs improvment tobacco-- never ETOH-- never   Physical Exam  General:  alert and well-developed.   Psych:  Oriented X3, memory intact for recent and remote, normally interactive, good eye contact, not anxious appearing, and not depressed appearing.     Impression & Recommendations:  Problem # 1:  DIABETES MELLITUS, TYPE II (ICD-250.00)  last hemoglobin A1c was 10.6 Since then he has  started the medication and in the last 2 days has improved his diet Encouraged to continue his healthier  lifestyle  We'll continue with the same medications but patient will cut amaryl to 1mg  qd  if his blood sugar is less than 90 consistently. He may even have to stop Amaryl altogether.  Patient understood the plan.  we'll check the A1c in April  Followup in 3 months His updated medication list for this problem includes:    Amaryl 2 Mg Tabs (Glimepiride) .[name]... 1 by mouth daily.    Metformin Hcl 500 Mg Tabs (Metformin hcl) .[name]... 1 by mouth two times a day.  Labs Reviewed: Creat: 0.78 (06/26/2010)    Reviewed HgBA1c results: 10.6 (06/26/2010)  7.5 (04/01/2009)  Problem # 2:  HYPOTHYROIDISM (ICD-244.9)  last TSH elevated to 4.5  Good medication compliance Recheck labs , see prescriptions  His updated medication list for this problem includes:    Levothyroxine Sodium 200 Mcg Solr (Levothyroxine sodium) .[name]... 1 by mouth once daily  Problem # 3:  HYPERLIPIDEMIA (ICD-272.4)  last cholesterol panel show a triglyceride of 936. She is status and a fibroid 3 weeks ago and is changing his diet Labs in April Follow up in 3 months His updated medication list for this problem includes:    Fenofibrate 160 Mg Tabs (Fenofibrate) .[name]... 1 by mouth once daily  Problem # 4:   face-to-face more than   15 minutes, more than 50% of the time counseling  Complete Medication List: 1)   Levothyroxine Sodium 200 Mcg Solr (Levothyroxine sodium) .[name].. 1 by mouth once daily 2)  Allopurinol 300 Mg Tabs (Allopurinol) .[name].. 1 by mouth qd 3)  Nexium 40 Mg Cpdr (Esomeprazole magnesium) .[name].. 1 by mouth once daily 4)  Flonase 50 Mcg/act Susp (Fluticasone propionate) .... 2 puffs once daily 5)  Fasting Labs  .... Flp, bmp, psa, cbc, vitamin d-- v70.0 6)  Amaryl 2 Mg Tabs (Glimepiride) .[name].. 1 by mouth daily. 7)  Metformin Hcl 500 Mg Tabs (Metformin hcl) .[name].. 1 by mouth two times a day. 8)  Onetouch Ultra Blue Strp (Glucose blood) .... Once daily. 9)  Onetouch Ultrasoft Lancets Misc (Lancets) .... Once daily. 10)  Fenofibrate 160 Mg Tabs (Fenofibrate) .[name].. 1 by mouth once daily 11)  Labs  .... Tsh----dx hypothroidim free testosterone, total testosterone---- dx hypogonadism 12)  Labs To Be Done  ~ 09-05-2010  .... A1c-----dx dm flp ast alt----dx hyperlipidemia  Patient Instructions: 1)  Please schedule a follow-up appointment in 4 months .  Prescriptions: LABS TO BE DONE  ~ 09-05-2010 A1C-----dx DM FLP AST ALT----dx hyperlipidemia  #1 x 0   Entered and Authorized by:   Nolon Rod. Paz MD   Signed by:   Nolon Rod. Paz MD on 08/05/2010   Method used:   Print then Give to Patient   RxID:   [id number] LABS TSH----dx hypothroidim Free testosterone, total testosterone---- dx hypogonadism  #1 x 0   Entered and Authorized by:   Nolon Rod. Paz MD   Signed by:   Nolon Rod. Paz MD on 08/05/2010   Method used:   Print then Give to Patient   RxID:   [id number]    Orders Added: 1)  Est. Patient Level III [02725]

## 2010-08-13 ENCOUNTER — Telehealth: Payer: Self-pay | Admitting: Internal Medicine

## 2010-08-18 NOTE — Letter (Signed)
Summary: [facility] Diabetes [facility]  N[facility] Diabetes [facility]   Imported By: Maryln Gottron 08/12/2010 14:08:54  _____________________________________________________________________  External Attachment:    Type:   Image     Comment:   External Document

## 2010-08-19 ENCOUNTER — Telehealth: Payer: Self-pay | Admitting: Internal Medicine

## 2010-08-19 NOTE — Telephone Encounter (Signed)
Advise patient:  I got his labs, will be scanned  -- thyroid test is normal, continue with the same  Synthroid dose  --His testosterone is the slightly low,  the free testosterone is pending. He may need medication. Will discuss on return to the office

## 2010-08-20 NOTE — Telephone Encounter (Signed)
I spoke w/ pt he is aware.  

## 2010-08-24 ENCOUNTER — Encounter: Payer: Self-pay | Admitting: Internal Medicine

## 2010-08-27 NOTE — Progress Notes (Signed)
Summary: labs   Phone Note Outgoing Call   Summary of Call: advise patient: i havent received labs (see last OV) [place] E. Yanessa Hocevar MD  August 13, 2010 8:40 PM   Follow-up for Phone Call        Pt has not gotten the labs done- he will go today.    New/Updated Medications: ONETOUCH ULTRA BLUE  STRP (GLUCOSE BLOOD) BS Bid. Prescriptions: ONETOUCH ULTRA BLUE  STRP (GLUCOSE BLOOD) BS Bid.  #1 month x 11   Entered by:   [name]   Authorized by:   Nolon Rod. Javaya [place] MD   Signed by:   [name] on 08/14/2010   Method used:   Electronically to        [facility] [phone]* (retail)       [address]       [place], [place]  [address]       Ph: [phone]       Fax: [phone]   RxID:   [id number]

## 2010-08-28 ENCOUNTER — Telehealth: Payer: Self-pay | Admitting: Internal Medicine

## 2010-08-28 NOTE — Telephone Encounter (Signed)
I spoke w/ pt he is aware.  

## 2010-08-28 NOTE — Telephone Encounter (Signed)
Advised patient, I got his labs from 08-14-10, his free testosterone is okay, we simply need to recheck his testosterone in few months. Please remind him he is due for A1c, fasting lipid profile, AST and ALT in about 10 days

## 2010-08-31 ENCOUNTER — Ambulatory Visit: Payer: Self-pay | Admitting: Internal Medicine

## 2010-09-02 NOTE — Progress Notes (Signed)
Routed to Dr.Paz      KP

## 2010-09-14 ENCOUNTER — Telehealth: Payer: Self-pay | Admitting: Internal Medicine

## 2010-09-14 NOTE — Telephone Encounter (Signed)
I spoke w/ pt he is aware.  

## 2010-09-14 NOTE — Telephone Encounter (Signed)
Advise patient: Diabetes has improved, A1c has dropped from 10.6 ot  7.2. Continue with Amaryl 2 mg daily and metformin 500 mg twice a day. Triglycerides have decreased from the 900s to 140. LDL 103, very close to his goal of < 100 Good results, come back around 10-2010 [facility]

## 2010-09-15 ENCOUNTER — Encounter: Payer: Self-pay | Admitting: Internal Medicine

## 2010-10-14 ENCOUNTER — Other Ambulatory Visit: Payer: Self-pay

## 2010-10-14 MED ORDER — ESOMEPRAZOLE MAGNESIUM 40 MG PO CPDR: 40.0000 mg | DELAYED_RELEASE_CAPSULE | Freq: Every day | ORAL | Status: DC

## 2010-10-16 NOTE — H&P (Signed)
NAMEHIDEO, GOOGE NO.:  [id number]   MEDICAL RECORD NO.:  [id number]          PATIENT TYPE:  INP   LOCATION:  6729                         FACILITY:  [facility]   PHYSICIAN:  Willow Ora, MD           DATE OF BIRTH:  September 18, 1960   DATE OF ADMISSION:  07/01/2006  DATE OF DISCHARGE:                              HISTORY & PHYSICAL   CHIEF COMPLAINT:  Overdose.   HISTORY OF PRESENT ILLNESS:  Mr. Colin Wood is a 50 year old white male  who is admitted to the hospital with an overdose of medication and  depression.  The patient states that he has been depressed over 1 year  and lately he has been feeling an overwhelming feeling of killing  himself.  Today, in the morning, he took 2 packets of sleep aids, over-  the-counter medicine, around 40 to 60 mL of Tylenol Cough Syrup, and  also a handful of pills.  Both the patient and the wife, who is at  bedside, are unsure if this was Tylenol or Motrin.  At the time I saw  him in the ER, he was calm and cooperative but still somehow suicidal.   PAST MEDICAL HISTORY:  1. Long history of depression.  He saw a psychiatrist 4 years ago, but      he has not talked with anybody, including me who sees him for his      primary care needs, about depression and has not taken any      medication.  2. Gout.  3. Hypothyroidism.   SOCIAL HISTORY:  Does not smoke or drink.  Does not do drugs.  He is  married.   FAMILY HISTORY:  1. Diabetes runs in the family.  2. No coronary artery disease.  3. No suicidal attempts on any family member that he knows of.   REVIEW OF SYSTEMS:  Denies any headache.  He felt slightly nauseous  today.  No vomiting.  He had some minimal abdominal pain, but he thinks  it is because he is hungry.  No shortness of breath.  No chest pain.  No  double vision.  He states that this is his first suicidal attempt.   MEDICATIONS:  1. Allopurinol 300 one p.o. daily.  2. Synthroid once a day, dose unknown.  3.  Vitamins.   ALLERGIES:  NO KNOWN DRUG ALLERGIES.   PHYSICAL EXAM:  The patient is alert, oriented, in no physical distress.  He does have a somehow flat affect.  Wife is at bedside.  Temperature  99, heart rate 130 initially, then it went down to 107, blood pressure  138/108, respirations 20 to 24.  Lungs are clear to auscultation  bilaterally.  Cardiovascular:  Regular rate and rhythm without a murmur.  Abdomen is not distended, soft, good bowel sounds.  Extremities:  No  edema.  Neurological exam:  Speech is normal.  Motor exam is normal.  Extraocular movements are intact.  Pupils are equal and reactive to  light and accommodation.   LABORATORY AND X-RAYS:  White blood cells 8.8, hemoglobin 16.4,  platelets 220.  Alcohol level is negative.  Salicylate level is less  than 4.0.  Urinary drug screen is pending.  LFTs are normal.  Creatinine  1.0, potassium 3.8.   ASSESSMENT AND PLAN:  1. The patient is admitted with a drug overdose.  It is unclear to me      if he had acetaminophen, nonsteroidal anti-inflammatories, or both.      Consequently, I will check acetaminophen levels now and tomorrow      and serial salicylate levels.  Even though it is several hours      since the overdose, he probably will still benefit from charcoal by      mouth.  2. Depression.  He is still suicidal.  I already contacted the      psychiatric team.  He will be admitted under suicidal watch.  3. Hypothyroidism.  We will check levels of TSH, and since the dose of      Synthroid is unknown we will give him Synthroid 100 mcg.  4. Gout.  We will continue with allopurinol.      Willow Ora, MD  Electronically Signed     JP/MEDQ  D:  07/01/2006  T:  07/02/2006  Job:  [id number]

## 2010-10-27 ENCOUNTER — Telehealth: Payer: Self-pay

## 2010-10-27 NOTE — Telephone Encounter (Signed)
Medication approved from 10/21/2010-10/21/2011

## 2010-12-01 ENCOUNTER — Telehealth: Payer: Self-pay | Admitting: Internal Medicine

## 2010-12-01 NOTE — Telephone Encounter (Signed)
Please advise 

## 2010-12-01 NOTE — Telephone Encounter (Signed)
Patient has a follow up appointment [phone] - he wants rx for lab to be done before appt - he has labs drawn at [facility]

## 2010-12-03 MED ORDER — AMBULATORY NON FORMULARY MEDICATION: Status: DC

## 2010-12-03 NOTE — Telephone Encounter (Signed)
Fasting labs: Hemoglobin A1c---- dx  diabetes TSH ---- dx hypothyroidism FLP---- dx  high cholesterol BMP, AST, ALT Uric acid ---dx gout

## 2010-12-03 NOTE — Telephone Encounter (Signed)
Will place orders upfront. Pt is aware.

## 2010-12-10 ENCOUNTER — Encounter: Payer: Self-pay | Admitting: Cardiology

## 2010-12-10 ENCOUNTER — Ambulatory Visit (INDEPENDENT_AMBULATORY_CARE_PROVIDER_SITE_OTHER): Payer: 59 | Admitting: Cardiology

## 2010-12-10 ENCOUNTER — Ambulatory Visit (INDEPENDENT_AMBULATORY_CARE_PROVIDER_SITE_OTHER): Payer: 59 | Admitting: Internal Medicine

## 2010-12-10 ENCOUNTER — Encounter: Payer: Self-pay | Admitting: Internal Medicine

## 2010-12-10 DIAGNOSIS — R000 Tachycardia, unspecified: Secondary | ICD-10-CM

## 2010-12-10 DIAGNOSIS — E119 Type 2 diabetes mellitus without complications: Secondary | ICD-10-CM

## 2010-12-10 DIAGNOSIS — E785 Hyperlipidemia, unspecified: Secondary | ICD-10-CM

## 2010-12-10 DIAGNOSIS — K219 Gastro-esophageal reflux disease without esophagitis: Secondary | ICD-10-CM

## 2010-12-10 DIAGNOSIS — I4891 Unspecified atrial fibrillation: Secondary | ICD-10-CM

## 2010-12-10 DIAGNOSIS — G4730 Sleep apnea, unspecified: Secondary | ICD-10-CM

## 2010-12-10 DIAGNOSIS — E039 Hypothyroidism, unspecified: Secondary | ICD-10-CM

## 2010-12-10 DIAGNOSIS — E669 Obesity, unspecified: Secondary | ICD-10-CM

## 2010-12-10 LAB — GLUCOSE, POCT (MANUAL RESULT ENTRY): POC Glucose: 94

## 2010-12-10 MED ORDER — FENOFIBRATE 160 MG PO TABS: 160.0000 mg | ORAL_TABLET | Freq: Every day | ORAL | Status: DC

## 2010-12-10 MED ORDER — DILTIAZEM HCL ER COATED BEADS 120 MG PO CP24: 120.0000 mg | ORAL_CAPSULE | Freq: Every day | ORAL | Status: DC

## 2010-12-10 MED ORDER — FLUTICASONE PROPIONATE 50 MCG/ACT NA SUSP: 2.0000 | Freq: Every day | NASAL | Status: DC

## 2010-12-10 MED ORDER — LEVOTHYROXINE SODIUM 200 MCG PO TABS: 200.0000 ug | ORAL_TABLET | Freq: Every day | ORAL | Status: DC

## 2010-12-10 MED ORDER — METFORMIN HCL 500 MG PO TABS: 500.0000 mg | ORAL_TABLET | Freq: Two times a day (BID) | ORAL | Status: DC

## 2010-12-10 MED ORDER — GLIMEPIRIDE 2 MG PO TABS: 2.0000 mg | ORAL_TABLET | Freq: Every day | ORAL | Status: DC

## 2010-12-10 MED ORDER — ALLOPURINOL 300 MG PO TABS: 300.0000 mg | ORAL_TABLET | Freq: Every day | ORAL | Status: DC

## 2010-12-10 MED ORDER — ESOMEPRAZOLE MAGNESIUM 40 MG PO CPDR: 40.0000 mg | DELAYED_RELEASE_CAPSULE | Freq: Every day | ORAL | Status: DC

## 2010-12-10 NOTE — Progress Notes (Signed)
HPI Patient presents for evaluation of an irregular EKG.  He saw Dr. Drue Novel today and was noted to have atrial fibrillation.  He was presenting for a routine appt.  He has actually had no new symptoms. He really hasn't noticed any rapid heart rate or decreased exercise tolerance. He says he rarely has some palpitations when he drinks caffeine. He said no presyncope or syncope. He denies any chest pressure, neck or arm discomfort. He said no new shortness of breath, PND or orthopnea. He said no weight gain or edema. She does have diaphoresis but he says always sweats freely.  No Known Allergies  Current Outpatient Prescriptions  Medication Sig Dispense Refill  . allopurinol (ZYLOPRIM) 300 MG tablet Take 1 tablet (300 mg total) by mouth daily.  30 tablet  0  . esomeprazole (NEXIUM) 40 MG capsule Take 1 capsule (40 mg total) by mouth daily before breakfast.  90 capsule  1  . fenofibrate 160 MG tablet Take 1 tablet (160 mg total) by mouth daily.  30 tablet  0  . fluticasone (FLONASE) 50 MCG/ACT nasal spray Place 2 sprays into the nose daily.  16 g  1  . glimepiride (AMARYL) 2 MG tablet Take 1 mg by mouth daily before breakfast.        . levothyroxine (SYNTHROID, LEVOTHROID) 200 MCG tablet Take 1 tablet (200 mcg total) by mouth daily.  30 tablet  0  . metFORMIN (GLUCOPHAGE) 500 MG tablet Take 1 tablet (500 mg total) by mouth 2 (two) times daily with a meal.  180 tablet  1    Past Medical History  Diagnosis Date  . Bipolar disorder     used to see psych, on no meds as of 03/2009  . Rhinitis     vasomotor  . Hypothyroidism   . Hyperlipidemia   . Gout   . GERD (gastroesophageal reflux disease)   . Diabetes mellitus     Past Surgical History  Procedure Date  . Tonsillectomy   . Vasectomy     Family History  Problem Relation Age of Onset  . Diabetes Father   . Hypertension Brother   . Cancer Brother     melanoma  . Asthma Daughter   . Cancer Maternal Uncle     throat  . Cancer Paternal  Grandfather     colon    History   Social History  . Marital Status: Married    Spouse Name: N/A    Number of Children: 2  . Years of Education: N/A   Occupational History  . works at [employer] History Main Topics  . Smoking status: Never Smoker   . Smokeless tobacco: Never Used  . Alcohol Use: No  . Drug Use: Not on file  . Sexually Active: Not on file   Other Topics Concern  . Not on file   Social History Narrative  . No narrative on file    ROS:  Positive for sinusitis, reflux, gout, snoring, daytime fatigue.  Otherwise as stated in the HPI and negative for all other systems.  PHYSICAL EXAM BP 122/84  Pulse 101  Resp 16  Ht 6\' 2"  (1.88 m)  Wt 268 lb (121.564 kg)  BMI 34.41 kg/m2 GENERAL:  Well appearing HEENT:  Pupils equal round and reactive, fundi not visualized, oral mucosa unremarkable NECK:  No jugular venous distention, waveform within normal limits, carotid upstroke brisk and symmetric, no bruits, no thyromegaly LYMPHATICS:  No cervical, inguinal adenopathy LUNGS:  Clear to auscultation bilaterally BACK:  No CVA tenderness CHEST:  Unremarkable HEART:  PMI not displaced or sustained,S1 and S2 within normal limits, no S3, no S4, no clicks, no rubs, no murmurs, irregular ABD:  Flat, positive bowel sounds normal in frequency in pitch, no bruits, no rebound, no guarding, no midline pulsatile mass, no hepatomegaly, no splenomegaly EXT:  2 plus pulses throughout, no edema, no cyanosis no clubbing SKIN:  No rashes no nodules NEURO:  Cranial nerves II through XII grossly intact, motor grossly intact throughout PSYCH:  Cognitively intact, oriented to person place and time   EKG:  Atrial fibrillation, axis within normal limits, intervals within normal limits, no acute ST-T wave changes  ASSESSMENT AND PLAN

## 2010-12-10 NOTE — Assessment & Plan Note (Signed)
The patient has atrial fibrillation of unknown duration. At this point I don't know whether this is persistent or paroxysmal. He will start taking an aspirin. I will check a TSH. I will begin rate control using Cardizem rather than beta blockers (I don't want to mask hypoglycemia symptoms which he has had rarely in the past). I will get an echocardiogram. When he comes back for this he will get a 24-hour monitor.  I will consider anticoagulation and cardioversion after reviewing this. I spent quite a bit of time discussing this diagnosis with the patient and his wife.

## 2010-12-10 NOTE — Progress Notes (Signed)
  Subjective:    Patient ID: Colin Wood, male    DOB: [birth year], 50 y.o.   MRN: [id number]  HPI ROV DM-- CBGs usually low mid AM, often~ 70 -60, he feels slt dizzy-weak, takes crackers and feels better ; biggest meal of the day is dinner , amaryl is taken in the afternoon  Thyroid, good  Med compliance  Hyperlipidemia -- on fenofibrate , good compliance   DATA REVIEWED: Labs from St[facility]12 BMP normal Cholesterol 194, triglycerides 184, HDL 45, LDL 112. Hemoglobin A1c 6.0. TSH 1.2. AST ALT normal Past Medical History  Diagnosis Date  . Bipolar disorder     used to see psych, on no meds as of 03/2009  . Rhinitis     vasomotor  . Hypothyroidism   . Hyperlipidemia   . Gout   . GERD (gastroesophageal reflux disease)   . Diabetes mellitus      Social History: Reviewed history from 04/28/2010 and no changes required. Married 2 kids works at [employer] unchanged, needs improvment tobacco-- never ETOH-- never   Review of Systems  Respiratory: Negative for cough and shortness of breath.   Cardiovascular: Negative for chest pain and leg swelling.  Gastrointestinal: Negative for nausea, abdominal pain, diarrhea and blood in stool.       Nexium makes a huge differenece w/ GERD (stops the cough)       Objective:   Physical Exam  Constitutional: He is oriented to person, place, and time. He appears well-developed and well-nourished.       slt diaphoretic  Cardiovascular: Regular rhythm and normal heart sounds.   No murmur heard.      slt tachycardic  Pulmonary/Chest: Effort normal and breath sounds normal. No respiratory distress. He has no wheezes. He has no rales.  Musculoskeletal: He exhibits no edema.  Neurological: He is alert and oriented to person, place, and time.  Psychiatric: He has a normal mood and affect. His behavior is normal. Judgment and thought content normal.          Assessment & Plan:

## 2010-12-10 NOTE — Assessment & Plan Note (Addendum)
Very well controlled, A1C 6.0 see above. Has hypoglycemia most mornings, heaviest meal of the day is dinner, takes amaryl in the PM. Often times skips breakfast, like today Recommend to decrease Amaryl to half tablet

## 2010-12-10 NOTE — Assessment & Plan Note (Signed)
He has symptoms consistent with sleep apnea which could be related to his dysrhythmia. I will refer him for a sleep consult.

## 2010-12-10 NOTE — Patient Instructions (Addendum)
See cardiology now

## 2010-12-10 NOTE — Assessment & Plan Note (Signed)
Sx was mostly cough and throat congestion, well controlled on PPIs

## 2010-12-10 NOTE — Assessment & Plan Note (Signed)
We began to discuss weight as it relates to sleep apnea. I will continue to educate.

## 2010-12-10 NOTE — Assessment & Plan Note (Signed)
H/o TG in the 900s, well controlled. LDL > 110, discussed meds, likes to work on exercise and life style before statins , I agree, re asses in 4 months

## 2010-12-10 NOTE — Assessment & Plan Note (Signed)
Patient noted to be slightly diaphoretic and tachycardic, EKG shows atrial fibrillation. Previous EKGs showed no A. fib  No CPor  shortness of breath. Most recent airplane trip was 2 weeks ago, no lower extremity edema, no palpitations. Discuss with [facility], they will see him today,  appreciate cardiology's help.

## 2010-12-10 NOTE — Patient Instructions (Signed)
Please have blood work today. (TSH, BMP)  Your physician has requested that you have an echocardiogram. Echocardiography is a painless test that uses sound waves to create images of your heart. It provides your doctor with information about the size and shape of your heart and how well your heart's chambers and valves are working. This procedure takes approximately one hour. There are no restrictions for this procedure.  Your physician has recommended that you wear a holter monitor. Holter monitors are medical devices that record the heart's electrical activity. Doctors most often use these monitors to diagnose arrhythmias. Arrhythmias are problems with the speed or rhythm of the heartbeat. The monitor is a small, portable device. You can wear one while you do your normal daily activities. This is usually used to diagnose what is causing palpitations/syncope (passing out).  You have been referred to pulmonary for a sleep apnea consult.  Please start Cardizem CD 120 mg a day.  Follow up with Dr Antoine Poche after all testing.

## 2010-12-10 NOTE — Assessment & Plan Note (Signed)
At goal , see lab @ [facility]

## 2010-12-11 ENCOUNTER — Encounter: Payer: Self-pay | Admitting: Cardiology

## 2010-12-11 ENCOUNTER — Encounter: Payer: Self-pay | Admitting: Internal Medicine

## 2010-12-18 ENCOUNTER — Ambulatory Visit (HOSPITAL_COMMUNITY): Payer: 59 | Attending: Cardiology | Admitting: Radiology

## 2010-12-18 ENCOUNTER — Encounter (INDEPENDENT_AMBULATORY_CARE_PROVIDER_SITE_OTHER): Payer: 59

## 2010-12-18 DIAGNOSIS — E119 Type 2 diabetes mellitus without complications: Secondary | ICD-10-CM | POA: Insufficient documentation

## 2010-12-18 DIAGNOSIS — I4891 Unspecified atrial fibrillation: Secondary | ICD-10-CM

## 2010-12-18 DIAGNOSIS — I079 Rheumatic tricuspid valve disease, unspecified: Secondary | ICD-10-CM | POA: Insufficient documentation

## 2010-12-18 DIAGNOSIS — E785 Hyperlipidemia, unspecified: Secondary | ICD-10-CM | POA: Insufficient documentation

## 2010-12-18 DIAGNOSIS — E669 Obesity, unspecified: Secondary | ICD-10-CM | POA: Insufficient documentation

## 2010-12-18 DIAGNOSIS — R9431 Abnormal electrocardiogram [ECG] [EKG]: Secondary | ICD-10-CM | POA: Insufficient documentation

## 2010-12-18 DIAGNOSIS — I059 Rheumatic mitral valve disease, unspecified: Secondary | ICD-10-CM | POA: Insufficient documentation

## 2010-12-23 ENCOUNTER — Other Ambulatory Visit: Payer: Self-pay | Admitting: *Deleted

## 2010-12-23 DIAGNOSIS — I4891 Unspecified atrial fibrillation: Secondary | ICD-10-CM

## 2010-12-23 MED ORDER — DABIGATRAN ETEXILATE MESYLATE 150 MG PO CAPS: 150.0000 mg | ORAL_CAPSULE | Freq: Two times a day (BID) | ORAL | Status: DC

## 2010-12-23 MED ORDER — DILTIAZEM HCL ER COATED BEADS 180 MG PO CP24: 180.0000 mg | ORAL_CAPSULE | Freq: Every day | ORAL | Status: DC

## 2010-12-25 ENCOUNTER — Encounter: Payer: Self-pay | Admitting: Cardiology

## 2011-01-07 ENCOUNTER — Encounter: Payer: Self-pay | Admitting: Pulmonary Disease

## 2011-01-07 ENCOUNTER — Ambulatory Visit (INDEPENDENT_AMBULATORY_CARE_PROVIDER_SITE_OTHER): Payer: 59 | Admitting: Pulmonary Disease

## 2011-01-07 VITALS — BP 100/80 | HR 152 | Temp 98.4°F | Ht 74.0 in | Wt 278.8 lb

## 2011-01-07 DIAGNOSIS — G4733 Obstructive sleep apnea (adult) (pediatric): Secondary | ICD-10-CM

## 2011-01-07 NOTE — Patient Instructions (Signed)
Will schedule for sleep study, and arrange followup for review once results available. Work on weight loss

## 2011-01-07 NOTE — Assessment & Plan Note (Signed)
The pt's history is very suggestive of significant sleep apnea.  He has been noted to have snoring, abnl breathing pattern during sleep, nonrestorative sleep, and EDS.  I have had a long discussion with the pt about sleep apnea, including its impact on QOL and CV health.  I have recommended a sleep study for evaluation, and also counseled him on weight loss.

## 2011-01-07 NOTE — Progress Notes (Signed)
  Subjective:    Patient ID: Colin Wood, male    DOB: August 12, 1960, 50 y.o.   MRN: [id number]  HPI The pt is a 50y/o male who I have been asked to see for possible osa.  His history is significant for the following: -snoring and an abnl breathing pattern during sleep.  +gasping arousals per pt. -frequent awakenings during the night, sleeps better in a recliner, not rested in am's upon arising. -definite sleep pressure during day with inactivity, decreased alertness and concentration while at work.  Takes nap in car at lunch. -falls asleep/dozes in early evening with tv/movies, mild sleep pressure with driving longer distances. - epworth score 14 today, weight neutral over last 2 yrs.   Sleep Questionnaire: What time do you typically go to bed?( Between what hours) 9-11pm How long does it take you to fall asleep? 42min-1hr How many times during the night do you wake up? 3 What time do you get out of bed to start your day? 0600 Do you drive or operate heavy machinery in your occupation? No How much has your weight changed (up or down) over the past two years? (In pounds) 0 oz (0 kg) Have you ever had a sleep study before? No Do you currently use CPAP? No Do you wear oxygen at any time? No  Review of Systems  Constitutional: Negative for fever and unexpected weight change.  HENT: Positive for congestion. Negative for ear pain, nosebleeds, sore throat, rhinorrhea, sneezing, trouble swallowing, dental problem, postnasal drip and sinus pressure.   Eyes: Positive for redness and itching.  Respiratory: Positive for cough. Negative for chest tightness, shortness of breath and wheezing.   Cardiovascular: Positive for palpitations. Negative for leg swelling.  Gastrointestinal: Negative for nausea and vomiting.  Genitourinary: Negative for dysuria.  Musculoskeletal: Negative for joint swelling.  Skin: Negative for rash.  Neurological: Negative for headaches.  Hematological: Does not bruise/bleed  easily.  Psychiatric/Behavioral: Negative for dysphoric mood. The patient is not nervous/anxious.        Objective:   Physical Exam Constitutional:  Obese male, no acute distress  HENT:  Nares patent without discharge, but deviated septum to right  Oropharynx without exudate, palate and uvula are elongated.  Eyes:  Perrla, eomi, no scleral icterus  Neck:  No JVD, no TMG  Cardiovascular:  Apical rate approx 110, irregular rhythm, no rubs or gallops.  No murmurs        Intact distal pulses  Pulmonary :  Normal breath sounds, no stridor or respiratory distress   No rales, rhonchi, or wheezing  Abdominal:  Soft, nondistended, bowel sounds present.  No tenderness noted.   Musculoskeletal:  minimal lower extremity edema noted in ankle  Lymph Nodes:  No cervical lymphadenopathy noted  Skin:  No cyanosis noted  Neurologic:  Alert, appropriate, moves all 4 extremities without obvious deficit.         Assessment & Plan:

## 2011-01-10 ENCOUNTER — Ambulatory Visit (HOSPITAL_BASED_OUTPATIENT_CLINIC_OR_DEPARTMENT_OTHER): Payer: 59 | Attending: Pulmonary Disease

## 2011-01-10 DIAGNOSIS — G4733 Obstructive sleep apnea (adult) (pediatric): Secondary | ICD-10-CM | POA: Insufficient documentation

## 2011-01-10 DIAGNOSIS — I4891 Unspecified atrial fibrillation: Secondary | ICD-10-CM | POA: Insufficient documentation

## 2011-01-20 ENCOUNTER — Telehealth: Payer: Self-pay | Admitting: *Deleted

## 2011-01-20 DIAGNOSIS — G4733 Obstructive sleep apnea (adult) (pediatric): Secondary | ICD-10-CM

## 2011-01-20 DIAGNOSIS — I4891 Unspecified atrial fibrillation: Secondary | ICD-10-CM

## 2011-01-20 NOTE — Telephone Encounter (Signed)
Pt called back and is scheduled to see [facility] tomorrow at 9:30 am

## 2011-01-20 NOTE — Procedures (Signed)
NAMEFRANCO, Colin Wood            ACCOUNT NO.:  [id number]  MEDICAL RECORD NO.:  [id number]          PATIENT TYPE:  OUT  LOCATION:  SLEEP CENTER                 FACILITY:  [facility]  PHYSICIAN:  Barbaraann Share, MD,FCCPDATE OF BIRTH:  Oct 08, 1959  DATE OF STUDY:  01/10/2011                           NOCTURNAL POLYSOMNOGRAM  REFERRING PHYSICIAN:  Barbaraann Share, MD,FCCP  INDICATION FOR STUDY:  Hypersomnia with sleep apnea.  EPWORTH SLEEPINESS SCORE:  14.  MEDICATIONS:  SLEEP ARCHITECTURE:  The patient had a total sleep time of 329 minutes with adequate slow wave sleep for age, but decreased quantity of REM. Sleep onset was normal at 10 minutes and REM latency was prolonged at 206 minutes.  Sleep efficiency was moderately reduced at 77%.  RESPIRATORY DATA:  The patient was found to have 11 apneas and 26 obstructive hypopneas giving him an apnea/hypopnea index of 7 events per hour.  He was also noted to have 37 respiratory effort-related arousals. Events were worse in the supine position, and there was loud snoring noted throughout.  OXYGEN DATA:  There is O2 desaturation as low as 89% with the patient's obstructive events.  CARDIAC DATA:  The patient was in atrial fibrillation with a controlled ventricular response, and also had rare PVCs.  MOVEMENT-PARASOMNIA:  The patient had no significant leg jerks or other abnormal behaviors.  IMPRESSIONS-RECOMMENDATIONS: 1. Mild obstructive sleep apnea/hypopnea syndrome with an AHI of 7     events per hour and O2 desaturation as low as 89%.  The patient     also had respiratory events of decreased air flow, but not     associated with desaturation and meets the criteria for respiratory     effort related arousals.  Treatment for this degree of sleep apnea     can include weight loss, upper airway surgery, dental appliance,     and CPAP.  The decision to treat this aggressively     should be based on its impact to the patient's known  cardiac     history and also quality of life. 2. Atrial fibrillation with a controlled ventricular response.  There     were also rare PVCs.     Barbaraann Share, MD,FCCP Diplomate, American Board of Sleep Medicine Electronically Signed    [facility]/MEDQ  D:  01/20/2011 08:39:37  T:  01/20/2011 09:14:15  Job:  [id number]

## 2011-01-20 NOTE — Telephone Encounter (Signed)
Pt needs ov with kc to discuss sleep study results.  i have held a few slots in [facility] schedule for pt.  [id number]

## 2011-01-21 ENCOUNTER — Encounter: Payer: Self-pay | Admitting: Pulmonary Disease

## 2011-01-21 ENCOUNTER — Ambulatory Visit (INDEPENDENT_AMBULATORY_CARE_PROVIDER_SITE_OTHER): Payer: 59 | Admitting: Pulmonary Disease

## 2011-01-21 VITALS — BP 126/90 | HR 82 | Temp 97.8°F | Ht 74.0 in | Wt 276.8 lb

## 2011-01-21 DIAGNOSIS — G4733 Obstructive sleep apnea (adult) (pediatric): Secondary | ICD-10-CM

## 2011-01-21 NOTE — Progress Notes (Signed)
  Subjective:    Patient ID: Rudolpho Sevin, male    DOB: [birth year], 50 y.o.   MRN: [id number]  HPI The patient comes in today for followup after his recent sleep study.  He was found to have very mild sleep apnea, with an AHI of 7 events per hour.  I have reviewed this study with him in detail, and answered all of his questions.   Review of Systems  Constitutional: Negative for fever and unexpected weight change.  HENT: Positive for congestion and trouble swallowing. Negative for ear pain, nosebleeds, sore throat, rhinorrhea, sneezing, dental problem, postnasal drip and sinus pressure.   Eyes: Negative for redness and itching.  Respiratory: Positive for choking. Negative for cough, chest tightness, shortness of breath and wheezing.   Cardiovascular: Positive for palpitations. Negative for leg swelling.  Gastrointestinal: Negative for nausea and vomiting.  Genitourinary: Negative for dysuria.  Musculoskeletal: Negative for joint swelling.  Skin: Negative for rash.  Neurological: Negative for headaches.  Hematological: Does not bruise/bleed easily.  Psychiatric/Behavioral: Negative for dysphoric mood. The patient is not nervous/anxious.        Objective:   Physical Exam Obese male in nad Nose without purulence or discharge noted Lower extremities without edema, no cyanosis noted. Alert, moves all 4 extremities       Assessment & Plan:

## 2011-01-21 NOTE — Patient Instructions (Signed)
Consider a trial of weight loss alone versus cpap or dental appliance while working on weight loss for treatment of your mild sleep apnea Please call me to let me know what you decide.

## 2011-01-21 NOTE — Assessment & Plan Note (Signed)
The patient has very mild sleep apnea by his recent sleep study, but he is clearly symptomatic at night and during the day.  The good news here is this represents very low cardiovascular risk for him.  He does have atrial fibrillation, but I understand that rate control has not been an issue for him.  I have asked the patient to consider conservative versus aggressive treatment, with aggressive weight loss being the key.  If he feels that he is overly symptomatic, or if he is going to have difficulties losing weight without sleeping better, I have recommended treatment with either a dental appliance or CPAP.

## 2011-01-28 ENCOUNTER — Telehealth: Payer: Self-pay | Admitting: Pulmonary Disease

## 2011-01-28 NOTE — Telephone Encounter (Signed)
Spoke with pt and he just wanted an estimate about how much a cpap cost. I spoke with Almyra Free and she estimated around 1500, I advised pt of this and he states he is going to discuss this with his cardiologists and will call back if he wants to start cpap

## 2011-01-29 ENCOUNTER — Encounter: Payer: Self-pay | Admitting: Cardiology

## 2011-01-29 ENCOUNTER — Encounter: Payer: Self-pay | Admitting: *Deleted

## 2011-01-29 ENCOUNTER — Ambulatory Visit (INDEPENDENT_AMBULATORY_CARE_PROVIDER_SITE_OTHER): Payer: 59 | Admitting: Cardiology

## 2011-01-29 DIAGNOSIS — G4733 Obstructive sleep apnea (adult) (pediatric): Secondary | ICD-10-CM

## 2011-01-29 DIAGNOSIS — I4891 Unspecified atrial fibrillation: Secondary | ICD-10-CM

## 2011-01-29 DIAGNOSIS — G4730 Sleep apnea, unspecified: Secondary | ICD-10-CM

## 2011-01-29 DIAGNOSIS — E669 Obesity, unspecified: Secondary | ICD-10-CM

## 2011-01-29 DIAGNOSIS — E785 Hyperlipidemia, unspecified: Secondary | ICD-10-CM

## 2011-01-29 MED ORDER — DILTIAZEM HCL ER COATED BEADS 240 MG PO CP24: 180.0000 mg | ORAL_CAPSULE | Freq: Every day | ORAL | Status: DC

## 2011-01-29 MED ORDER — DABIGATRAN ETEXILATE MESYLATE 150 MG PO CAPS: 150.0000 mg | ORAL_CAPSULE | Freq: Two times a day (BID) | ORAL | Status: DC

## 2011-01-29 NOTE — Assessment & Plan Note (Signed)
He will have DCCV.  If he fails this I would likely consider flecainide.

## 2011-01-29 NOTE — Assessment & Plan Note (Signed)
I prescribed the [facility] Diet

## 2011-01-29 NOTE — Assessment & Plan Note (Signed)
I did suggest that he start CPAP as he does have symptoms that might be related to this condition.

## 2011-01-29 NOTE — Progress Notes (Signed)
HPI The patient presents for followup of atrial fibrillation since I last saw echo demonstrated no significant valvular abnormalities. His EF is about 50% but he was inebriated at that time. Holter demonstrated persistent atrial fibrillation with occasional rapid rates. Heart is in 180 mg was started.  He was started on Pradaxa.  He also had a sleep study which demonstrated mild apnea. He saw Dr. Shelle Iron.  The decision to start CPAP was deferred until this conversation. He does have fatigue continuing. However, he doesn't notice palpitations, presyncope or syncope. Since starting his anticoagulant he has had some reflux symptoms. He's had no chest pressure, neck or arm discomfort. He's had no change in his weight and edema. Of note his TSH was normal.  No Known Allergies  Current Outpatient Prescriptions  Medication Sig Dispense Refill  . allopurinol (ZYLOPRIM) 300 MG tablet Take 1 tablet (300 mg total) by mouth daily.  30 tablet  0  . dabigatran (PRADAXA) 150 MG CAPS Take 1 capsule (150 mg total) by mouth every 12 (twelve) hours.  60 capsule  6  . diltiazem (CARDIZEM CD) 180 MG 24 hr capsule Take 1 capsule (180 mg total) by mouth daily.  30 capsule  3  . esomeprazole (NEXIUM) 40 MG capsule Take 1 capsule (40 mg total) by mouth daily before breakfast.  90 capsule  1  . fenofibrate 160 MG tablet Take 1 tablet (160 mg total) by mouth daily.  30 tablet  0  . fish oil-omega-3 fatty acids 1000 MG capsule Take 1 g by mouth 3 (three) times daily.        . fluticasone (FLONASE) 50 MCG/ACT nasal spray Place 2 sprays into the nose daily.  16 g  1  . glimepiride (AMARYL) 2 MG tablet Take 1 mg by mouth daily before breakfast.        . glucosamine-chondroitin 500-400 MG tablet Take 1 tablet by mouth 4 (four) times daily.        [name] levothyroxine (SYNTHROID, LEVOTHROID) 200 MCG tablet Take 1 tablet (200 mcg total) by mouth daily.  30 tablet  0  . metFORMIN (GLUCOPHAGE) 500 MG tablet Take 1 tablet (500 mg total) by  mouth 2 (two) times daily with a meal.  180 tablet  1  . Multiple Vitamin (MULTIVITAMIN) capsule Take 1 capsule by mouth daily.          Past Medical History  Diagnosis Date  . Bipolar disorder     used to see psych, on no meds as of 03/2009  . Rhinitis     vasomotor  . Hypothyroidism   . Hyperlipidemia   . Gout   . GERD (gastroesophageal reflux disease)   . Diabetes mellitus     Past Surgical History  Procedure Date  . Tonsillectomy   . Vasectomy   . Nasal sinus surgery     ROS:  As stated in the HPI and negative for all other systems.  PHYSICAL EXAM BP 137/89  Pulse 125  Ht 6\' 2"  (1.88 m)  Wt 274 lb 1.9 oz (124.34 kg)  BMI 35.19 kg/m2 GENERAL:  Well appearing HEENT:  Pupils equal round and reactive, fundi not visualized, oral mucosa unremarkable NECK:  No jugular venous distention, waveform within normal limits, carotid upstroke brisk and symmetric, no bruits, no thyromegaly LYMPHATICS:  No cervical, inguinal adenopathy LUNGS:  Clear to auscultation bilaterally BACK:  No CVA tenderness CHEST:  Unremarkable HEART:  PMI not displaced or sustained,S1 and S2 within normal limits, no S3, no  S4, no clicks, no rubs, no murmurs ABD:  Flat, positive bowel sounds normal in frequency in pitch, no bruits, no rebound, no guarding, no midline pulsatile mass, no hepatomegaly, no splenomegaly EXT:  2 plus pulses throughout, no edema, no cyanosis no clubbing SKIN:  No rashes no nodules NEURO:  Cranial nerves II through XII grossly intact, motor grossly intact throughout PSYCH:  Cognitively intact, oriented to person place and time   EKG:  Atrial fibrillation, rate 125, no ST-T wave changes, incomplete right bundle branch block  ASSESSMENT AND PLAN

## 2011-01-29 NOTE — Patient Instructions (Signed)
Your physician has recommended that you have a Cardioversion (DCCV). Electrical Cardioversion uses a jolt of electricity to your heart either through paddles or wired patches attached to your chest. This is a controlled, usually prescheduled, procedure. Defibrillation is done under light anesthesia in the hospital, and you usually go home the day of the procedure. This is done to get your heart back into a normal rhythm. You are not awake for the procedure. Please see the instruction sheet given to you today.  Please increase your Cardizem to 240 mg daily.  Continue all other medications the same.

## 2011-01-29 NOTE — Assessment & Plan Note (Signed)
His LDL was 112. We discussed dietary changes.

## 2011-02-02 ENCOUNTER — Other Ambulatory Visit: Payer: Self-pay | Admitting: Pulmonary Disease

## 2011-02-02 ENCOUNTER — Telehealth: Payer: Self-pay | Admitting: Pulmonary Disease

## 2011-02-02 DIAGNOSIS — G4733 Obstructive sleep apnea (adult) (pediatric): Secondary | ICD-10-CM

## 2011-02-02 NOTE — Telephone Encounter (Signed)
Let pt know that I have sent note to [facility], and need to see him about 4 weeks after starting cpap.  He needs to call me if issues with tolerance so that I can help troubleshoot.

## 2011-02-02 NOTE — Telephone Encounter (Signed)
Called, spoke with pt.  He is aware order has been sent to [facility] to start cpap, and he will receive another call to set this up.  I informed pt KC will need to see him about 4 wks after starting cpap - pt will call once he starts the cpap to set this appt up.  He is also aware to call if he has issues with tolerance so that [facility] can help troubleshoot.  Colin Wood verbalized understanding of these instructions.

## 2011-02-02 NOTE — Telephone Encounter (Signed)
Spoke with pt. He states that he has decided to go with CPAP rather than dental appliance and would like order sent now. Will forward to [facility].

## 2011-02-05 ENCOUNTER — Ambulatory Visit (HOSPITAL_COMMUNITY)
Admission: RE | Admit: 2011-02-05 | Discharge: 2011-02-05 | Disposition: A | Discharge status: Home / Self Care | Payer: 59 | Source: Ambulatory Visit | Attending: Internal Medicine | Admitting: Internal Medicine

## 2011-02-05 DIAGNOSIS — G4733 Obstructive sleep apnea (adult) (pediatric): Secondary | ICD-10-CM | POA: Insufficient documentation

## 2011-02-05 DIAGNOSIS — K219 Gastro-esophageal reflux disease without esophagitis: Secondary | ICD-10-CM | POA: Insufficient documentation

## 2011-02-05 DIAGNOSIS — I4891 Unspecified atrial fibrillation: Secondary | ICD-10-CM

## 2011-02-05 LAB — BASIC METABOLIC PANEL
GFR calc non Af Amer: >60 mL/min (ref >60)
Glucose, Bld: 77 mg/dL (ref 70–99)
Potassium: 4.2 mEq/L (ref 3.5–5.1)
Sodium: 145 mEq/L (ref 135–145)

## 2011-02-05 LAB — CBC
Hemoglobin: 15 g/dL (ref 13.0–17.0)
MCH: 29.1 pg (ref 26.0–34.0)
MCV: 79.6 fL (ref 78.0–100.0)
RBC: 5.15 MIL/uL (ref 4.22–5.81)

## 2011-02-05 LAB — PROTIME-INR: Prothrombin Time: 16.2 seconds — ABNORMAL HIGH (ref 11.6–15.2)

## 2011-02-08 NOTE — Op Note (Signed)
  NAMEJVEON, Colin Wood NO.:  [id number]  MEDICAL RECORD NO.:  [id number]  LOCATION:  [facility]                         FACILITY:  [facility]  PHYSICIAN:  Vesta Mixer, M.D. DATE OF BIRTH:  Mar 01, 1961  DATE OF PROCEDURE:  02/05/2011 DATE OF DISCHARGE:  02/05/2011                              OPERATIVE REPORT   [name] is a 50 year old gentleman with a history of atrial fibrillation.  He has been on Pradaxa for at least a month.  He was scheduled for elective cardioversion.  The patient received IV propofol - 150 mg intravenously.  Synchronous cardioversion was performed at 120 joules, 200 joules, and 200 joules. The patient remained in atrial fibrillation.  He did not even transiently convert to sinus rhythm.  COMPLICATIONS:  None.  CONCLUSIONS:  Unsuccessful cardioversion.  The patient will likely need to be tried on some different medications, including possibly flecainide.  He will see Dr. Antoine Poche in a week or so.     Vesta Mixer, M.D.     PJN/MEDQ  D:  02/05/2011  T:  02/06/2011  Job:  [id number]  cc:   Rollene Rotunda, MD, [facility]  Electronically Signed by Kristeen Miss M.D. on 02/08/2011 07:45:31 PM

## 2011-02-09 ENCOUNTER — Telehealth: Payer: Self-pay | Admitting: Cardiology

## 2011-02-09 NOTE — Telephone Encounter (Signed)
Pt had cardioversion last Friday, pt said didn't work still has fast heartrate, wants to know the next step

## 2011-02-09 NOTE — Telephone Encounter (Signed)
Spoke with pt who states cardioversion was attempted X 3 Friday unsuccessfully.  Pt wants to know what the next step is.  Will discuss with Dr Antoine Poche and call pt back.  He is agreeable.

## 2011-02-10 ENCOUNTER — Telehealth: Payer: Self-pay | Admitting: Cardiology

## 2011-02-10 DIAGNOSIS — I4891 Unspecified atrial fibrillation: Secondary | ICD-10-CM

## 2011-02-10 MED ORDER — FLECAINIDE ACETATE 50 MG PO TABS: 50.0000 mg | ORAL_TABLET | Freq: Two times a day (BID) | ORAL | Status: DC

## 2011-02-10 NOTE — Telephone Encounter (Signed)
Dr Antoine Poche called pt and discussed plan

## 2011-02-10 NOTE — Telephone Encounter (Signed)
Addended by: Sharin Grave on: 02/10/2011 01:30 PM   Modules accepted: Orders

## 2011-02-10 NOTE — Telephone Encounter (Signed)
I called to speak with the patient about his failed cardioversion.  The next step will be flecainide 50 mg bid (he has no contraindications to this).  We can see him in about one week and decide on increasing dose if still in fibrillation.  Once we have done this we can schedule cardioversion again.  He remains on Pradaxa.  He is also going to pick up the CPAP and start using this this week.

## 2011-02-10 NOTE — Telephone Encounter (Signed)
Pt aware of orders. RX being sent into [facility] as requested.  appt scheduled with Tereso Newcomer, PA on 02/24/2011.  Dr Antoine Poche will see the pt as well on that day.

## 2011-02-10 NOTE — Telephone Encounter (Signed)
Pt returning call. Please return call to pt to discuss.

## 2011-02-24 ENCOUNTER — Ambulatory Visit (INDEPENDENT_AMBULATORY_CARE_PROVIDER_SITE_OTHER): Payer: 59 | Admitting: Physician Assistant

## 2011-02-24 ENCOUNTER — Encounter: Payer: Self-pay | Admitting: Physician Assistant

## 2011-02-24 ENCOUNTER — Telehealth: Payer: Self-pay | Admitting: Cardiology

## 2011-02-24 ENCOUNTER — Encounter: Payer: Self-pay | Admitting: *Deleted

## 2011-02-24 VITALS — BP 140/82 | HR 121 | Ht 74.0 in | Wt 274.0 lb

## 2011-02-24 DIAGNOSIS — G4730 Sleep apnea, unspecified: Secondary | ICD-10-CM

## 2011-02-24 DIAGNOSIS — I4891 Unspecified atrial fibrillation: Secondary | ICD-10-CM

## 2011-02-24 MED ORDER — FLECAINIDE ACETATE 50 MG PO TABS: 100.0000 mg | ORAL_TABLET | Freq: Two times a day (BID) | ORAL | Status: DC

## 2011-02-24 MED ORDER — DILTIAZEM HCL ER COATED BEADS 120 MG PO CP24: 360.0000 mg | ORAL_CAPSULE | Freq: Every day | ORAL | Status: DC

## 2011-02-24 MED ORDER — DABIGATRAN ETEXILATE MESYLATE 150 MG PO CAPS: 150.0000 mg | ORAL_CAPSULE | Freq: Two times a day (BID) | ORAL | Status: DC

## 2011-02-24 NOTE — Telephone Encounter (Signed)
Pt's wife is calling about samples she said her husband didn't get them when he was here today

## 2011-02-24 NOTE — Patient Instructions (Addendum)
Your physician has recommended that you have a Cardioversion (DCCV) 03/01/11 @ 1:00 PM WITH DR. HOCHREIN. Electrical Cardioversion uses a jolt of electricity to your heart either through paddles or wired patches attached to your chest. This is a controlled, usually prescheduled, procedure. Defibrillation is done under light anesthesia in the hospital, and you usually go home the day of the procedure. This is done to get your heart back into a normal rhythm. You are not awake for the procedure. Please see the instruction sheet given to you today.  Your physician recommends that you return for lab work in: TODAY PRE PROCEDURE LABS FOR DCCV 03/01/11  Your physician has recommended you make the following change in your medication: INCREASE DILTIAZEM TO 360 MG DAILY TAKE 3 120 MG CAP AT ONE TIME; INCREASE FLECAINIDE TO 100 MG TWICE DAILY

## 2011-02-24 NOTE — Progress Notes (Signed)
History of Present Illness: Primary Cardiologist:  Dr. Rollene Rotunda  Colin Wood is a 50 y.o. male presents for follow up on Afib.  Echo 7/12: mild LVH, EF 50%, mod LAE, mild RAE, PASP 29.  He was anticoagulated with Pradaxa and unsuccessfully cardioverted on 9/7.  Dr. Antoine Poche placed him on Flecainide 50 mg BID and had him follow up today.  He is still in AFib.  The patient denies chest pain, shortness of breath, syncope, orthopnea, PND or significant pedal edema or palpitations.  He is now sleeping better with CPAP on.    Past Medical History  Diagnosis Date  . Bipolar disorder     used to see psych, on no meds as of 03/2009  . Rhinitis     vasomotor  . Hypothyroidism   . Hyperlipidemia   . Gout   . GERD (gastroesophageal reflux disease)   . Diabetes mellitus   . Persistent atrial fibrillation     Current Outpatient Prescriptions  Medication Sig Dispense Refill  . allopurinol (ZYLOPRIM) 300 MG tablet Take 1 tablet (300 mg total) by mouth daily.  30 tablet  0  . dabigatran (PRADAXA) 150 MG CAPS Take 1 capsule (150 mg total) by mouth every 12 (twelve) hours.  180 capsule  3  . esomeprazole (NEXIUM) 40 MG capsule Take 1 capsule (40 mg total) by mouth daily before breakfast.  90 capsule  1  . fenofibrate 160 MG tablet Take 1 tablet (160 mg total) by mouth daily.  30 tablet  0  . fish oil-omega-3 fatty acids 1000 MG capsule Take 1 g by mouth 3 (three) times daily.        . flecainide (TAMBOCOR) 50 MG tablet Take 2 tablets (100 mg total) by mouth 2 (two) times daily.  360 tablet  3  . fluticasone (FLONASE) 50 MCG/ACT nasal spray Place 2 sprays into the nose daily.  16 g  1  . glimepiride (AMARYL) 2 MG tablet Take 1 mg by mouth daily before breakfast.        . glucosamine-chondroitin 500-400 MG tablet Take 1 tablet by mouth 4 (four) times daily.        [name] levothyroxine (SYNTHROID, LEVOTHROID) 200 MCG tablet Take 1 tablet (200 mcg total) by mouth daily.  30 tablet  0  . metFORMIN  (GLUCOPHAGE) 500 MG tablet Take 1 tablet (500 mg total) by mouth 2 (two) times daily with a meal.  180 tablet  1  . Multiple Vitamin (MULTIVITAMIN) capsule Take 1 capsule by mouth daily.        [name] DISCONTD: diltiazem (CARDIZEM CD) 240 MG 24 hr capsule Take 1 capsule (240 mg total) by mouth daily.  30 capsule  3  . DISCONTD: flecainide (TAMBOCOR) 50 MG tablet Take 1 tablet (50 mg total) by mouth 2 (two) times daily.  60 tablet  3  . DISCONTD: flecainide (TAMBOCOR) 50 MG tablet Take 2 tablets (100 mg total) by mouth 2 (two) times daily.  360 tablet  3  . diltiazem (CARDIZEM CD) 120 MG 24 hr capsule Take 3 capsules (360 mg total) by mouth daily.  270 capsule  3  . DISCONTD: diltiazem (CARDIZEM CD) 120 MG 24 hr capsule Take 3 capsules (360 mg total) by mouth daily.  270 capsule  3    Allergies: No Known Allergies  Social Hx: non smoker  ROS:  Please see the history of present illness.  All other systems reviewed and negative.   Vital Signs: BP 140/82  Pulse 121  Ht 6\' 2"  (1.88 m)  Wt 274 lb (124.286 kg)  BMI 35.18 kg/m2  PHYSICAL EXAM: Well nourished, well developed, in no acute distress HEENT: normal Neck: no JVD Cardiac:  normal S1, S2; irreg irreg rhythm; no murmur Lungs:  clear to auscultation bilaterally, no wheezing, rhonchi or rales Abd: soft, nontender, no hepatomegaly Ext: trace bilateral edema Skin: warm and dry Neuro:  CNs 2-12 intact, no focal abnormalities noted  EKG:  Atrial fibrillation, HR 121, NSSTTW changes.  ASSESSMENT AND PLAN:

## 2011-02-24 NOTE — Assessment & Plan Note (Signed)
Persistent.  Rate uncontrolled.  Increase Diltiazem to 360 mg QD.  Increase Flecainide to 100 mg BID.  Schedule repeat DCCV next week with Dr. Antoine Poche.  Patient also seen by Dr. Antoine Poche today.  I spent 30 minutes answering questions regarding atrial fibrillation and prospective treatments.

## 2011-02-24 NOTE — Telephone Encounter (Signed)
Per wife - Colin Wood was working on samples for pt.  Phone call transferred to [facility]

## 2011-02-24 NOTE — Telephone Encounter (Signed)
I s/w pt's wife and I set up a time to meet her later after work to bring the samples of Pradaxa that I forgot  To give to the pt earlier today while he was in the office. Danielle Rankin

## 2011-02-24 NOTE — Assessment & Plan Note (Signed)
Now on CPAP. 

## 2011-02-25 NOTE — Telephone Encounter (Signed)
lmom the AM of 03/01/11 for pt's DCCV he is to take 240 mg of diltiazem and not the 360, this is just the morning of DCCV per scott weaver, pa-c. Danielle Rankin

## 2011-03-01 ENCOUNTER — Telehealth: Payer: Self-pay | Admitting: Cardiology

## 2011-03-01 ENCOUNTER — Telehealth: Payer: Self-pay | Admitting: *Deleted

## 2011-03-01 ENCOUNTER — Ambulatory Visit (HOSPITAL_COMMUNITY)
Admission: RE | Admit: 2011-03-01 | Discharge: 2011-03-01 | Disposition: A | Discharge status: Home / Self Care | Payer: 59 | Source: Ambulatory Visit | Attending: Cardiology | Admitting: Cardiology

## 2011-03-01 DIAGNOSIS — I4891 Unspecified atrial fibrillation: Secondary | ICD-10-CM

## 2011-03-01 DIAGNOSIS — G4733 Obstructive sleep apnea (adult) (pediatric): Secondary | ICD-10-CM | POA: Insufficient documentation

## 2011-03-01 DIAGNOSIS — E119 Type 2 diabetes mellitus without complications: Secondary | ICD-10-CM | POA: Insufficient documentation

## 2011-03-01 DIAGNOSIS — F319 Bipolar disorder, unspecified: Secondary | ICD-10-CM | POA: Insufficient documentation

## 2011-03-01 HISTORY — PX: CARDIOVERSION: SHX1299

## 2011-03-01 MED ORDER — FLECAINIDE ACETATE 150 MG PO TABS: 150.0000 mg | ORAL_TABLET | Freq: Two times a day (BID) | ORAL | Status: DC

## 2011-03-01 NOTE — Telephone Encounter (Signed)
Received lab results from [facility].  They were faxed to Short Stay.  Left message for pt that results were received and faxed.

## 2011-03-01 NOTE — Telephone Encounter (Signed)
Message copied by Sharin Grave on Mon Mar 01, 2011  1:56 PM ------      Message from: Rollene Rotunda      Created: Mon Mar 01, 2011  1:36 PM       Pam can you send a prescription [facility] [address] [place].  Thanks.

## 2011-03-01 NOTE — Telephone Encounter (Signed)
Pt having a cardioversion and wanted to know if lab work from [facility] done 9-26 is back and we have the results? pls call [phone]

## 2011-03-03 ENCOUNTER — Encounter: Payer: Self-pay | Admitting: *Deleted

## 2011-03-04 ENCOUNTER — Ambulatory Visit (INDEPENDENT_AMBULATORY_CARE_PROVIDER_SITE_OTHER): Payer: 59 | Admitting: Internal Medicine

## 2011-03-04 ENCOUNTER — Encounter: Payer: Self-pay | Admitting: Internal Medicine

## 2011-03-04 ENCOUNTER — Telehealth: Payer: Self-pay | Admitting: *Deleted

## 2011-03-04 DIAGNOSIS — E669 Obesity, unspecified: Secondary | ICD-10-CM

## 2011-03-04 DIAGNOSIS — I4891 Unspecified atrial fibrillation: Secondary | ICD-10-CM

## 2011-03-04 DIAGNOSIS — G4733 Obstructive sleep apnea (adult) (pediatric): Secondary | ICD-10-CM

## 2011-03-04 NOTE — Patient Instructions (Signed)
Will call back to schedule a Tikosyn load  Will need to stop Flecainide 3 days prior to coming in for Tikosyn  Your physician has recommended you make the following change in your medication: 1) Take Cardizem 240mg  twice daily

## 2011-03-04 NOTE — Telephone Encounter (Signed)
Spoke with pt. He is set up for Tikosyn admission on 9/22.

## 2011-03-04 NOTE — Progress Notes (Signed)
Colin Wood is a pleasant 50 y.o. male patient with a h/o persistent atrial fibrillation who presents today for EP consultation.  He reports initially being diagnosed with atrial fibrillation upon being evaluated by Dr Drue Novel for routine evaluation 12/10/10.  He reports symptoms of mild dizziness and diaphoresis with his afib.  In retrospect, he thinks that he has had afib for 5 years.  He reports gradual decline in exercise tolerance over that same time.  He has occasional palpitations.  He was evaluated by Dr Antoine Poche and initiated on diltiazem and pradaxa.  DCC was attempted 02/06/11 without success.  He was placed on flecainide and again returned for cardioversion 03/01/11.  He had transient sinus rhythm but quickly returned to afib.  Flecainide was increased to 150mg  BID and he is now referred to me for further evaluation.  Echo has revealed ef 50% with moderate LA enlargement (40mm).  He maintains an active lifestyle.  He reports SOB with moderate activity.  Today, he denies symptoms of chest pain, shortness of breath at rest, orthopnea, PND, lower extremity edema,  presyncope, syncope, or neurologic sequela. The patient is tolerating medications without difficulties and is otherwise without complaint today.   Past Medical History  Diagnosis Date  . Bipolar disorder     used to see psych, on no meds as of 03/2009  . Rhinitis     vasomotor  . Hypothyroidism   . Hyperlipidemia   . Gout   . GERD (gastroesophageal reflux disease)   . Diabetes mellitus   . Persistent atrial fibrillation   . Sleep apnea     mild, now treated with CPAP   Past Surgical History  Procedure Date  . Tonsillectomy   . Vasectomy   . Nasal sinus surgery   . Cardioversion 03/01/11    Current Outpatient Prescriptions  Medication Sig Dispense Refill  . allopurinol (ZYLOPRIM) 300 MG tablet Take 1 tablet (300 mg total) by mouth daily.  30 tablet  0  . dabigatran (PRADAXA) 150 MG CAPS Take 1 capsule (150 mg total) by  mouth every 12 (twelve) hours.  180 capsule  3  . diltiazem (CARDIZEM CD) 120 MG 24 hr capsule Take 3 capsules (360 mg total) by mouth daily.  270 capsule  3  . esomeprazole (NEXIUM) 40 MG capsule Take 1 capsule (40 mg total) by mouth daily before breakfast.  90 capsule  1  . fenofibrate 160 MG tablet Take 1 tablet (160 mg total) by mouth daily.  30 tablet  0  . fish oil-omega-3 fatty acids 1000 MG capsule Take 1 g by mouth 3 (three) times daily.        . flecainide (TAMBOCOR) 150 MG tablet Take 1 tablet (150 mg total) by mouth 2 (two) times daily.  60 tablet  6  . fluticasone (FLONASE) 50 MCG/ACT nasal spray Place 2 sprays into the nose daily.  16 g  1  . glimepiride (AMARYL) 2 MG tablet Take 2 mg by mouth daily before breakfast.       . glucosamine-chondroitin 500-400 MG tablet Take 1 tablet by mouth 4 (four) times daily.        [name] levothyroxine (SYNTHROID, LEVOTHROID) 200 MCG tablet Take 1 tablet (200 mcg total) by mouth daily.  30 tablet  0  . metFORMIN (GLUCOPHAGE) 500 MG tablet Take 1 tablet (500 mg total) by mouth 2 (two) times daily with a meal.  180 tablet  1  . Multiple Vitamin (MULTIVITAMIN) capsule Take 1 capsule by mouth  daily.          No Known Allergies  History   Social History  . Marital Status: Married    Spouse Name: N/A    Number of Children: 2  . Years of Education: N/A   Occupational History  . works at [employer] History Main Topics  . Smoking status: Never Smoker   . Smokeless tobacco: Never Used  . Alcohol Use: No  . Drug Use: No  . Sexually Active: Not on file   Other Topics Concern  . Not on file   Social History Narrative   Pt lives in [place] with spouse and children.  Works in Consulting civil engineer at [employer].    Family History  Problem Relation Age of Onset  . Diabetes Father   . Hypertension Brother   . Cancer Brother     melanoma  . Melanoma Brother   . Asthma Daughter   . Allergies Daughter   . Cancer Maternal Uncle     throat  . Throat  cancer Maternal Uncle   . Cancer Paternal Grandfather     colon  . Colon cancer Paternal Grandfather     ROS- All systems are reviewed and negative except as per the HPI above  Physical Exam: Filed Vitals:   03/04/11 0819  BP: 136/91  Pulse: 51  Height: 6\' 2"  (1.88 m)  Weight: 271 lb (122.925 kg)    GEN- The patient is well appearing, alert and oriented x 3 today.   Head- normocephalic, atraumatic Eyes-  Sclera clear, conjunctiva pink Ears- hearing intact Oropharynx- clear Neck- supple, no JVP Lymph- no cervical lymphadenopathy Lungs- Clear to ausculation bilaterally, normal work of breathing Heart- irregular rate and rhythm, no murmurs, rubs or gallops, PMI not laterally displaced GI- soft, NT, ND, + BS Extremities- no clubbing, cyanosis, or edema MS- no significant deformity or atrophy Skin- no rash or lesion Psych- euthymic mood, full affect Neuro- strength and sensation are intact  EKG 03/02/11 reveals afib V rate 101 BPM, RsR', Qtc 425  Assessment and Plan:

## 2011-03-04 NOTE — Telephone Encounter (Signed)
Pt called regarding a Tikosyn load.  He was told to speak you.

## 2011-03-08 ENCOUNTER — Other Ambulatory Visit: Payer: Self-pay | Admitting: *Deleted

## 2011-03-08 DIAGNOSIS — I4891 Unspecified atrial fibrillation: Secondary | ICD-10-CM

## 2011-03-08 MED ORDER — DABIGATRAN ETEXILATE MESYLATE 150 MG PO CAPS: 150.0000 mg | ORAL_CAPSULE | Freq: Two times a day (BID) | ORAL | Status: DC

## 2011-03-10 ENCOUNTER — Ambulatory Visit (INDEPENDENT_AMBULATORY_CARE_PROVIDER_SITE_OTHER): Payer: 59 | Admitting: Pulmonary Disease

## 2011-03-10 ENCOUNTER — Encounter: Payer: Self-pay | Admitting: Pulmonary Disease

## 2011-03-10 VITALS — BP 122/82 | HR 69 | Temp 98.1°F | Ht 74.0 in | Wt 279.4 lb

## 2011-03-10 DIAGNOSIS — G4733 Obstructive sleep apnea (adult) (pediatric): Secondary | ICD-10-CM

## 2011-03-10 NOTE — Assessment & Plan Note (Signed)
The patient is doing well with CPAP, and has seen improvement in his symptoms.  I have reminded him that we have yet to optimize his pressure, and we'll use the automatic mode in his machine to do this at home.  I have asked him to try and sleep in the bed in a little more upright position with 2-3 pillows.  I also asked him to search on the Internet for specialized CPAP pillows.  He is to call me if he continues to have tolerance issues with CPAP while sleeping in bed. Care Plan:  At this point, will arrange for the patient's machine to be changed over to auto mode for 2 weeks to optimize their pressure.  I will review the downloaded data once sent by dme, and also evaluate for compliance, leaks, and residual osa.  I will call the patient and dme to discuss the results, and have the patient's machine set appropriately.  This will serve as the pt's cpap pressure titration.

## 2011-03-10 NOTE — Patient Instructions (Signed)
Will get your machine set on auto for 2 weeks.  Will call you with results once I receive your download. Work on weight loss followup with me in 6mos, but call if issues develop.

## 2011-03-10 NOTE — Progress Notes (Signed)
  Subjective:    Patient ID: Colin Wood, male    DOB: 08/23/1960, 50 y.o.   MRN: [id number]  HPI The patient comes in today for followup of his sleep apnea.  He has been started on CPAP, and feels that he has done well with the device since the last visit.  He has noticed definite improvement in his sleep, and also his daytime alertness.  He is having no mask or pressure issues.  His only complaint is the mask will be dislodged when using his pillow while sleeping on his side.  Because of this, he has been sleeping in a recliner in the family room, and at some point will make his way back to the bedroom.   Review of Systems  Constitutional: Negative for fever and unexpected weight change.  HENT: Positive for congestion, rhinorrhea and postnasal drip. Negative for ear pain, nosebleeds, sore throat, sneezing, trouble swallowing, dental problem and sinus pressure.   Eyes: Negative for redness and itching.  Respiratory: Positive for cough and shortness of breath. Negative for chest tightness and wheezing.   Cardiovascular: Positive for palpitations and leg swelling.  Gastrointestinal: Negative for nausea and vomiting.  Genitourinary: Negative for dysuria.  Musculoskeletal: Negative for joint swelling.  Skin: Negative for rash.  Neurological: Negative for headaches.  Hematological: Does not bruise/bleed easily.  Psychiatric/Behavioral: Negative for dysphoric mood. The patient is not nervous/anxious.        Objective:   Physical Exam Obese male in nad No skin breakdown or pressure necrosis from cpap mask Nose without discharge or purulence LE without edema, no cyanosis noted. Alert, does not appear sleepy, moves all 4        Assessment & Plan:

## 2011-03-11 NOTE — Op Note (Signed)
  Colin Wood, SPARKS NO.:  [id number]  MEDICAL RECORD NO.:  [id number]  LOCATION:  [facility]                         FACILITY:  [facility]  PHYSICIAN:  Rollene Rotunda, MD, FACCDATE OF BIRTH:  Jul 28, 1960  DATE OF PROCEDURE:  03/01/2011 DATE OF DISCHARGE:  03/01/2011                              OPERATIVE REPORT   PROCEDURE:  Direct current cardioversion.  INDICATION:  The patient with symptomatic atrial fibrillation.  HISTORY OF PRESENT ILLNESS:  The patient signed appropriate informed consent.  He has been treated with flecainide 100 mg b.i.d.  He has been on an appropriate duration and course of Pradaxa.  Dr. Katrinka Blazing administered propofol, total of 30 mg during this procedure.  He was DC cardioverted x1 with 200 joules of biphasic energy and did develop normal sinus rhythm.  However, he reverted back to atrial fibrillation after several beats.  He was converted one more time again with 200 joules of biphasic synchronized energy.  He went back again in sinus rhythm, but quickly reverted back to fibrillation.  I will increase his flecainide to 150 mg b.i.d.  PLAN:  I plan on referring the patient to Dr. Johney Frame.  They can discuss further management with another antiarrhythmic and attempted cardioversion.  Ultimately, he might need to be considered for atrial fibrillation ablation.  He will continue the Pradaxa.     Rollene Rotunda, MD, [facility]     JH/MEDQ  D:  03/01/2011  T:  03/01/2011  Job:  [id number]  Electronically Signed by Rollene Rotunda MD [facility] on 03/11/2011 01:39:06 PM

## 2011-03-15 ENCOUNTER — Encounter: Payer: Self-pay | Admitting: Internal Medicine

## 2011-03-15 NOTE — Assessment & Plan Note (Signed)
Weight loss advised 

## 2011-03-15 NOTE — Assessment & Plan Note (Signed)
Importance of compliance with CPAP encouraged today

## 2011-03-15 NOTE — Assessment & Plan Note (Signed)
Colin Wood presents today for EP consultation regarding persistent atrial fibrillation refractory to cardioversion on flecainide.  I am not completely sure how symptomatic he is at this time. Therapeutic strategies for afib including medicine and ablation were discussed in detail with the patient today. Given his persistent afib, I think that his success rates with ablation are likely to be low (50-60%).  I would therefore recommend that we continue medical therapy at this time. I think that the best AAD option for his is tikosyn.  I have therefore stopped flecainide today.  Once flecainide has washed out, he will be admitted for initiation of tikosyn.   The importance of twice daily pradaxa for stroke prevention was again stressed today.

## 2011-03-22 ENCOUNTER — Inpatient Hospital Stay (HOSPITAL_COMMUNITY)
Admission: AD | Admit: 2011-03-22 | Discharge: 2011-03-25 | DRG: 310 | Disposition: A | Discharge status: Home / Self Care | Payer: 59 | Source: Ambulatory Visit | Attending: Internal Medicine | Admitting: Internal Medicine

## 2011-03-22 ENCOUNTER — Ambulatory Visit (INDEPENDENT_AMBULATORY_CARE_PROVIDER_SITE_OTHER): Payer: 59

## 2011-03-22 VITALS — BP 134/86 | Ht 74.0 in | Wt 270.0 lb

## 2011-03-22 DIAGNOSIS — I4891 Unspecified atrial fibrillation: Principal | ICD-10-CM | POA: Diagnosis present

## 2011-03-22 DIAGNOSIS — G4730 Sleep apnea, unspecified: Secondary | ICD-10-CM | POA: Diagnosis present

## 2011-03-22 DIAGNOSIS — J309 Allergic rhinitis, unspecified: Secondary | ICD-10-CM | POA: Diagnosis present

## 2011-03-22 DIAGNOSIS — E785 Hyperlipidemia, unspecified: Secondary | ICD-10-CM | POA: Diagnosis present

## 2011-03-22 DIAGNOSIS — M109 Gout, unspecified: Secondary | ICD-10-CM | POA: Diagnosis present

## 2011-03-22 DIAGNOSIS — E119 Type 2 diabetes mellitus without complications: Secondary | ICD-10-CM | POA: Diagnosis present

## 2011-03-22 DIAGNOSIS — F319 Bipolar disorder, unspecified: Secondary | ICD-10-CM | POA: Diagnosis present

## 2011-03-22 DIAGNOSIS — K219 Gastro-esophageal reflux disease without esophagitis: Secondary | ICD-10-CM | POA: Diagnosis present

## 2011-03-22 DIAGNOSIS — Z7901 Long term (current) use of anticoagulants: Secondary | ICD-10-CM

## 2011-03-22 DIAGNOSIS — E039 Hypothyroidism, unspecified: Secondary | ICD-10-CM | POA: Diagnosis present

## 2011-03-22 DIAGNOSIS — Z79899 Other long term (current) drug therapy: Secondary | ICD-10-CM

## 2011-03-22 NOTE — Progress Notes (Signed)
Colin Wood comes to clinic with his wife and daughter for pre-admission work up for [employer] start.  He was originally started on diltiazem and Pradaxa and cardioverted on 02/06/11 with no success.  He was started on flecainide and cardioverted again on 10/1 but unfortunately went back into atrial fibrillation.  He was evaluated by Dr. Johney Frame on 10/4.  It was decided to proceed with medical therapy rather than ablation at this time.    Pt took last dose of flecainide on 10/19, which gives [place] > 2 half lives since last class 1 antiarrhythmic.  He states he has been compliant with his Pradaxa.  All other medication reviewed and no issues with QTc prolongation or drug interactions.    Pt and wife both instructed on importance of compliance and possible side effects. Called pt's prescription coverage plan.   The medication is covered with no PA required.  It will cost pt a max of $50 for 30 days or $95 for 90 days.  Pt is aware of the cost.

## 2011-03-22 NOTE — Assessment & Plan Note (Signed)
Baseline labs and EKG done today.  EKG showed QTC of 399 ms.  K acceptable at 4.5 and Mg was 2.0.  SCr- 1.06 (CrCl>152mL/min).  No INR needed since pt on Pradaxa.  Will start patient at BID.  Orders sent to hospital and pt aware to report to admitting when bed available.

## 2011-03-23 DIAGNOSIS — I4891 Unspecified atrial fibrillation: Secondary | ICD-10-CM

## 2011-03-23 LAB — BASIC METABOLIC PANEL
BUN: 23 mg/dL (ref 6–23)
Chloride: 104 mEq/L (ref 96–112)
GFR calc Af Amer: >90 mL/min (ref >90)
Potassium: 3.8 mEq/L (ref 3.5–5.1)

## 2011-03-24 LAB — BASIC METABOLIC PANEL
CO2: 27 mEq/L (ref 19–32)
Chloride: 102 mEq/L (ref 96–112)
Potassium: 3.6 mEq/L (ref 3.5–5.1)
Sodium: 138 mEq/L (ref 135–145)

## 2011-03-25 LAB — BASIC METABOLIC PANEL
CO2: 27 mEq/L (ref 19–32)
Chloride: 104 mEq/L (ref 96–112)
Glucose, Bld: 138 mg/dL — ABNORMAL HIGH (ref 70–99)
Potassium: 4.5 mEq/L (ref 3.5–5.1)
Sodium: 141 mEq/L (ref 135–145)

## 2011-03-26 ENCOUNTER — Other Ambulatory Visit: Payer: Self-pay | Admitting: *Deleted

## 2011-03-26 ENCOUNTER — Telehealth: Payer: Self-pay | Admitting: Internal Medicine

## 2011-03-26 MED ORDER — DOFETILIDE 250 MCG PO CAPS: 250.0000 ug | ORAL_CAPSULE | Freq: Two times a day (BID) | ORAL | Status: DC

## 2011-03-26 MED ORDER — DOFETILIDE 500 MCG PO CAPS: 500.0000 ug | ORAL_CAPSULE | Freq: Two times a day (BID) | ORAL | Status: DC

## 2011-03-26 NOTE — Telephone Encounter (Signed)
Called patient regarding his Tikosyn refill.  Note said that he was loaded on Tikosyn 500 mcg bid, however, pt states he is only taking Tikosyn 250 mcg bid.  Re-sent corrected script and called pharmacy to fix error.  Judithe Modest, CMA

## 2011-03-26 NOTE — Telephone Encounter (Signed)
Pt just had Tikosyn load and he was put on 500 mcg bid.  [facility]

## 2011-03-26 NOTE — Telephone Encounter (Signed)
New message:  Pt called and finished Tikosyn low..  Please send pres. To mail order phar.  [facility].  Should be on file. If any question please call.  Please call pt when this has been done.

## 2011-03-31 ENCOUNTER — Encounter: Payer: Self-pay | Admitting: *Deleted

## 2011-04-01 ENCOUNTER — Ambulatory Visit (INDEPENDENT_AMBULATORY_CARE_PROVIDER_SITE_OTHER): Payer: 59

## 2011-04-01 ENCOUNTER — Other Ambulatory Visit: Payer: 59 | Admitting: *Deleted

## 2011-04-01 ENCOUNTER — Encounter: Payer: Self-pay | Admitting: Internal Medicine

## 2011-04-01 VITALS — BP 138/82 | HR 69

## 2011-04-01 DIAGNOSIS — I4891 Unspecified atrial fibrillation: Secondary | ICD-10-CM

## 2011-04-01 MED ORDER — DILTIAZEM HCL ER COATED BEADS 240 MG PO CP24: ORAL_CAPSULE | ORAL | Status: DC

## 2011-04-01 NOTE — Progress Notes (Signed)
In for EKG, post hosp-Tikosyn. BP 138/82 p 69. Also for lab but wants lab done at [facility];gave Rx for labs; EKG read by Dr. Jabier Mutton sinus rhy. Also requested new Rx be sent to [facility] for Diltiazem 240 mg; takes 2 capsules BID. Will reorder.

## 2011-04-05 ENCOUNTER — Telehealth: Payer: Self-pay | Admitting: Internal Medicine

## 2011-04-05 NOTE — Telephone Encounter (Signed)
New message:  Pt called and needs to get Tikosyn.  His mail order will not be here before Sat. And he will be out.  Do you have samples or call a  30 day supply [facility].  Please call patient and let him know.  He will be out on Thursday.

## 2011-04-05 NOTE — Telephone Encounter (Signed)
Samples left out front for patient  He should get his by Sat  He will me let me know if he needs any further samples

## 2011-04-13 ENCOUNTER — Telehealth: Payer: Self-pay | Admitting: Internal Medicine

## 2011-04-13 DIAGNOSIS — I4891 Unspecified atrial fibrillation: Secondary | ICD-10-CM

## 2011-04-13 MED ORDER — METOPROLOL SUCCINATE ER 25 MG PO TB24: 25.0000 mg | ORAL_TABLET | Freq: Every day | ORAL | Status: DC

## 2011-04-13 NOTE — Telephone Encounter (Signed)
I spoke with the patient. He states he was started on Tikosyn around 10/22. He is on Tikosyn 250 mcg twice daily. He states he was in and out of rhythm in the hospital when Tikosyn was started. He was more sinus at that time than not. Over the last 3 days, the patient reports that he has been in persistent a-fib with rates in the 100-140's at rest. He states he feels at his baseline, which is not great. I explained I would review with Dr. Johney Frame and call him back. Sherri Rad, RN, BSN   I reviewed the above with Dr. Johney Frame. He recommends the patient increase cardizem to 360mg  daily. I spoke with the patient and made him aware. He states he is already on cardizem 240mg  BID. I explained I would review with Dr. Johney Frame again and call him back. Text page sent to Dr. Johney Frame. Sherri Rad, RN, BSN

## 2011-04-13 NOTE — Telephone Encounter (Signed)
Pt wants results of blood work. He said the last three days he has been in afib please call

## 2011-04-13 NOTE — Telephone Encounter (Signed)
I spoke with Dr. Johney Frame. He recommends toprol 25mg  once daily. I have explained this to the patient. He is agreeable. He will try this and has been instructed if this does not work for him, he should call us back. Otherwise he will f/u with Dr. Johney Frame 04/26/11 as previously scheduled.

## 2011-04-16 ENCOUNTER — Telehealth: Payer: Self-pay | Admitting: Internal Medicine

## 2011-04-16 MED ORDER — DILTIAZEM HCL ER COATED BEADS 240 MG PO CP24: ORAL_CAPSULE | ORAL | Status: DC

## 2011-04-16 NOTE — Telephone Encounter (Signed)
[facility] calling regarding Cardizem CD.  Ref # [id number]

## 2011-04-16 NOTE — Telephone Encounter (Signed)
Sent a new 90 day of Diltiazem Rx to [facility] and a 30 day supply [facility] [place].  Caralee Ates, CMA

## 2011-04-16 NOTE — Telephone Encounter (Signed)
Called and spoke with pt to inform him I had sent [facility] in [facility] with 30 day supply as well as a 90 day supply to [facility].  Caralee Ates, CMA

## 2011-04-16 NOTE — Telephone Encounter (Signed)
Pt calling regarding RX of diltiazem sent to [facility] on 04/01/11. Our office needs to call pharmacy to approve refill rx.   Pt only has 4 tablets left. Pt needs nurse to call in 30 day supply of dilitiazem 240 mg 2x/day to [facility] [place] [phone]

## 2011-04-26 ENCOUNTER — Ambulatory Visit (INDEPENDENT_AMBULATORY_CARE_PROVIDER_SITE_OTHER): Payer: 59 | Admitting: Internal Medicine

## 2011-04-26 ENCOUNTER — Encounter: Payer: Self-pay | Admitting: Internal Medicine

## 2011-04-26 DIAGNOSIS — I4891 Unspecified atrial fibrillation: Secondary | ICD-10-CM

## 2011-04-26 DIAGNOSIS — G4733 Obstructive sleep apnea (adult) (pediatric): Secondary | ICD-10-CM

## 2011-04-26 NOTE — Progress Notes (Signed)
Colin Ora, MD, MD PCP  The patient presents today for routine electrophysiology followup.  Since last being seen in our clinic, the patient reports doing very well.  He is tolerating tikosyn without difficulty.  He appears to be mostly in sinus rhythm but reports having episodes of afib every 2-3 days, typically lasting under an hour.  Heart rates during afib are now 100 bpm since adding toprol and cardizem.  He reports fatigue and decreased exercise tolerance when in afib.  He reports feeing "better" in sinus rhythm.  Today, he denies symptoms of chest pain, shortness of breath, orthopnea, PND, lower extremity edema, dizziness, presyncope, syncope, or neurologic sequela.  The patient feels that he is tolerating medications without difficulties and is otherwise without complaint today.   Past Medical History  Diagnosis Date  . Bipolar disorder     used to see psych, on no meds as of 03/2009  . Rhinitis     vasomotor  . Hypothyroidism   . Hyperlipidemia   . Gout   . GERD (gastroesophageal reflux disease)   . Diabetes mellitus   . Persistent atrial fibrillation   . Sleep apnea     mild, now treated with CPAP   Past Surgical History  Procedure Date  . Tonsillectomy   . Vasectomy   . Nasal sinus surgery   . Cardioversion 03/01/11    Current Outpatient Prescriptions  Medication Sig Dispense Refill  . allopurinol (ZYLOPRIM) 300 MG tablet Take 1 tablet (300 mg total) by mouth daily.  30 tablet  0  . amoxicillin (AMOXIL) 875 MG tablet Take 875 mg by mouth 2 (two) times daily.       . dabigatran (PRADAXA) 150 MG CAPS Take 1 capsule (150 mg total) by mouth every 12 (twelve) hours.  180 capsule  3  . diltiazem (CARDIZEM CD) 240 MG 24 hr capsule Take 2 capsule (240 mg) twice a day  360 capsule  3  . dofetilide (TIKOSYN) 250 MCG capsule Take 1 capsule (250 mcg total) by mouth 2 (two) times daily.  180 capsule  1  . esomeprazole (NEXIUM) 40 MG capsule Take 1 capsule (40 mg total) by mouth daily  before breakfast.  90 capsule  1  . fenofibrate 160 MG tablet Take 1 tablet (160 mg total) by mouth daily.  30 tablet  0  . fish oil-omega-3 fatty acids 1000 MG capsule Take 1 g by mouth 3 (three) times daily.        . fluticasone (FLONASE) 50 MCG/ACT nasal spray Place 2 sprays into the nose daily.  16 g  1  . glimepiride (AMARYL) 2 MG tablet Take 2 mg by mouth daily before breakfast.       . glucosamine-chondroitin 500-400 MG tablet Take 1 tablet by mouth 4 (four) times daily.        [name] HYDROcodone-homatropine (HYCODAN) 5-1.5 MG/5ML syrup       . levothyroxine (SYNTHROID, LEVOTHROID) 200 MCG tablet Take 1 tablet (200 mcg total) by mouth daily.  30 tablet  0  . metFORMIN (GLUCOPHAGE) 500 MG tablet Take 1 tablet (500 mg total) by mouth 2 (two) times daily with a meal.  180 tablet  1  . metoprolol succinate (TOPROL-XL) 25 MG 24 hr tablet Take 1 tablet (25 mg total) by mouth daily.  30 tablet  6  . Multiple Vitamin (MULTIVITAMIN) capsule Take 1 capsule by mouth daily.        . ONE TOUCH ULTRA TEST test strip       .  ONETOUCH DELICA LANCETS MISC         No Known Allergies  History   Social History  . Marital Status: Married    Spouse Name: N/A    Number of Children: 2  . Years of Education: N/A   Occupational History  . works at [employer] History Main Topics  . Smoking status: Never Smoker   . Smokeless tobacco: Never Used  . Alcohol Use: No  . Drug Use: No  . Sexually Active: Not on file   Other Topics Concern  . Not on file   Social History Narrative   Pt lives in [place] with spouse and children.  Works in Consulting civil engineer at [employer].    Family History  Problem Relation Age of Onset  . Diabetes Father   . Hypertension Brother   . Cancer Brother     melanoma  . Melanoma Brother   . Asthma Daughter   . Allergies Daughter   . Cancer Maternal Uncle     throat  . Throat cancer Maternal Uncle   . Cancer Paternal Grandfather     colon  . Colon cancer Paternal Grandfather     Physical Exam: Filed Vitals:   04/26/11 1214  BP: 121/84  Pulse: 79  Resp: 18  Height: 6\' 2"  (1.88 m)  Weight: 268 lb (121.564 kg)    GEN- The patient is overweight appearing, alert and oriented x 3 today.   Head- normocephalic, atraumatic Eyes-  Sclera clear, conjunctiva pink Ears- hearing intact Oropharynx- clear Neck- supple, no JVP Lymph- no cervical lymphadenopathy Lungs- Clear to ausculation bilaterally, normal work of breathing Heart- Regular rate and rhythm, no murmurs, rubs or gallops, PMI not laterally displaced GI- soft, NT, ND, + BS Extremities- no clubbing, cyanosis, or edema MS- no significant deformity or atrophy Skin- no rash or lesion Psych- euthymic mood, full affect Neuro- strength and sensation are intact  ekg today reveals sinus rhythm, incomplete RBBB, QTc 447, LVH,   Assessment and Plan:

## 2011-04-26 NOTE — Patient Instructions (Signed)
Your physician recommends that you schedule a follow-up appointment in 3 months with Dr Allred    

## 2011-04-26 NOTE — Assessment & Plan Note (Signed)
Improved with tikosyn No changes today If afib burden increases further, we should consider catheter ablation

## 2011-04-26 NOTE — Assessment & Plan Note (Signed)
CPAP compliance enouraged

## 2011-04-30 ENCOUNTER — Encounter: Payer: Self-pay | Admitting: Internal Medicine

## 2011-05-07 ENCOUNTER — Ambulatory Visit: Payer: 59 | Admitting: Internal Medicine

## 2011-05-17 ENCOUNTER — Ambulatory Visit: Payer: 59 | Admitting: Internal Medicine

## 2011-05-19 ENCOUNTER — Encounter: Payer: Self-pay | Admitting: Internal Medicine

## 2011-05-21 ENCOUNTER — Ambulatory Visit (INDEPENDENT_AMBULATORY_CARE_PROVIDER_SITE_OTHER): Payer: 59 | Admitting: Internal Medicine

## 2011-05-21 ENCOUNTER — Encounter: Payer: Self-pay | Admitting: Internal Medicine

## 2011-05-21 VITALS — BP 118/82 | HR 93 | Temp 98.6°F | Ht 74.0 in | Wt 275.0 lb

## 2011-05-21 DIAGNOSIS — E039 Hypothyroidism, unspecified: Secondary | ICD-10-CM

## 2011-05-21 DIAGNOSIS — M109 Gout, unspecified: Secondary | ICD-10-CM

## 2011-05-21 DIAGNOSIS — E119 Type 2 diabetes mellitus without complications: Secondary | ICD-10-CM

## 2011-05-21 DIAGNOSIS — I4891 Unspecified atrial fibrillation: Secondary | ICD-10-CM

## 2011-05-21 DIAGNOSIS — G4700 Insomnia, unspecified: Secondary | ICD-10-CM

## 2011-05-21 DIAGNOSIS — E785 Hyperlipidemia, unspecified: Secondary | ICD-10-CM

## 2011-05-21 MED ORDER — ALLOPURINOL 300 MG PO TABS: 300.0000 mg | ORAL_TABLET | Freq: Every day | ORAL | Status: DC

## 2011-05-21 MED ORDER — GLUCOSE BLOOD VI STRP: ORAL_STRIP | Status: DC

## 2011-05-21 MED ORDER — ZOLPIDEM TARTRATE 10 MG PO TABS: 10.0000 mg | ORAL_TABLET | Freq: Every evening | ORAL | Status: AC | PRN

## 2011-05-21 MED ORDER — GLIMEPIRIDE 2 MG PO TABS: 2.0000 mg | ORAL_TABLET | Freq: Every day | ORAL | Status: DC

## 2011-05-21 MED ORDER — ONETOUCH DELICA LANCETS MISC: Status: DC

## 2011-05-21 MED ORDER — LEVOTHYROXINE SODIUM 200 MCG PO TABS: 200.0000 ug | ORAL_TABLET | Freq: Every day | ORAL | Status: DC

## 2011-05-21 MED ORDER — FLUTICASONE PROPIONATE 50 MCG/ACT NA SUSP: 2.0000 | Freq: Every day | NASAL | Status: DC

## 2011-05-21 MED ORDER — METFORMIN HCL 500 MG PO TABS: 500.0000 mg | ORAL_TABLET | Freq: Two times a day (BID) | ORAL | Status: DC

## 2011-05-21 MED ORDER — ESOMEPRAZOLE MAGNESIUM 40 MG PO CPDR: 40.0000 mg | DELAYED_RELEASE_CAPSULE | Freq: Every day | ORAL | Status: DC

## 2011-05-21 MED ORDER — FENOFIBRATE 160 MG PO TABS: 160.0000 mg | ORAL_TABLET | Freq: Every day | ORAL | Status: DC

## 2011-05-21 NOTE — Assessment & Plan Note (Addendum)
Good compliance with medicines, labs   

## 2011-05-21 NOTE — Patient Instructions (Addendum)
Please get the following labs, fasting: FLP--- dx high cholesterol CMP, CBC----dx  atrial fibrillation TSH---dx  hypothyroidism Uric acid--- dx gout  a1c--- dx Dm

## 2011-05-21 NOTE — Assessment & Plan Note (Signed)
Labs

## 2011-05-21 NOTE — Assessment & Plan Note (Signed)
Good compliance with medicines, labs

## 2011-05-21 NOTE — Progress Notes (Signed)
  Subjective:    Patient ID: Colin Wood, male    DOB: 06-08-1960, 50 y.o.   MRN: [id number]  HPI Since the last OV  he was diagnosed with atrial fibrillation, currently on Tikosyn. He can tell whether or not he is in normal sinus or A. Fib; his heart rate is usually in the 70s when he is in sinus but sometimes go as high as 90-110 when he is in A. fib. Diabetes--good medication compliance, CBGs in the last couple of weeks a slightly higher than usual. He was also diagnosed with sleep apnea, uses the CPAP. Complaining of insomnia, a new problem, has not discuss with pulmonary. He can fall asleep but wakes up through the night. Needs all his medications refilled  Past Medical History: dx w/  BIPOLAR DISORDER at some point , used to see psych, on no meds as of 03-2009 RHINITIS--vasomotor HYPOTHYROIDISM  HYPERLIPIDEMIA   GOUT   GERD Atrial fibrillation dx 2012 [name] dx 2012, on CPAP Insomnia  Past Surgical History: Tonsillectomy Vasectomy sinus surgery  Social History: Reviewed history from 04/28/2010 and no changes required. Married 2 kids works at [employer] unchanged, needs improvment tobacco-- never ETOH-- never     Review of Systems  no chest pain or shortness of breath Note nausea, vomiting, diarrhea     Objective:   Physical Exam  Constitutional: He is oriented to person, place, and time. He appears well-developed and well-nourished. No distress.  Cardiovascular:  No murmur heard.      Irregular today  Pulmonary/Chest: Breath sounds normal. No respiratory distress. He has no wheezes. He has no rales.  Musculoskeletal: He exhibits no edema.  Neurological: He is alert and oriented to person, place, and time.  Skin: He is not diaphoretic.      Assessment & Plan:  Today , I spent more than 25  min with the patient, >50% of the time counseling, and /or reviewing the chart

## 2011-05-21 NOTE — Assessment & Plan Note (Addendum)
Sleep habits discussed,  trial with Ambien. No interactions with his atrial fibrillation medication that I can tell

## 2011-05-21 NOTE — Assessment & Plan Note (Addendum)
Per cardiology, recommend patient to discuss with cardiology if his episodes of atrial fibrillation increased

## 2011-05-21 NOTE — Assessment & Plan Note (Addendum)
Due for labs , rf all medicines

## 2011-05-22 ENCOUNTER — Other Ambulatory Visit: Payer: Self-pay | Admitting: Cardiology

## 2011-05-28 ENCOUNTER — Telehealth: Payer: Self-pay | Admitting: *Deleted

## 2011-05-28 MED ORDER — DILTIAZEM HCL ER COATED BEADS 240 MG PO CP24: ORAL_CAPSULE | ORAL | Status: DC

## 2011-05-28 NOTE — Progress Notes (Signed)
This encounter was created in error - please disregard.

## 2011-05-28 NOTE — Telephone Encounter (Signed)
[facility] received from [facility] needing verification on Cardizem strength/ dosage. Taking two tablets bid is out of reccommended parameters. Please call and clarify, will need PA for this dose.

## 2011-05-28 NOTE — Telephone Encounter (Signed)
The medication should be 240mg  twice daily  I have corrected and sent [facility]

## 2011-05-31 ENCOUNTER — Telehealth: Payer: Self-pay | Admitting: Internal Medicine

## 2011-05-31 NOTE — Telephone Encounter (Signed)
Spoke with email  This was faxed and escribed again on 05/28/11

## 2011-05-31 NOTE — Telephone Encounter (Signed)
New Problem:     Called to receive clarification on patients diltiazem (CARDIZEM CD) 240 MG 24 hr capsule. Please advise.

## 2011-06-03 ENCOUNTER — Telehealth: Payer: Self-pay | Admitting: Internal Medicine

## 2011-06-03 NOTE — Telephone Encounter (Signed)
New problem:  Need a highest level dx code for lab drawn on 11/1. - ref # [id number]

## 2011-06-03 NOTE — Telephone Encounter (Signed)
lmom with diagnosis code for patient  427.31

## 2011-06-18 ENCOUNTER — Telehealth: Payer: Self-pay | Admitting: Internal Medicine

## 2011-06-18 NOTE — Telephone Encounter (Signed)
Labs reviewed: BMP normal AST 31, ALT 50, slightly elevated. Total cholesterol 201, triglycerides 217, LDL 111. Hemoglobin A1c 6.6. TSH 1.3. Advise patient: Diabetes and thyroid well-controlled Triglycerides have improved, LDL is very close to his goal of 100. Continue current medications and continue working on a healthier lifestyle. Followup in April as planned

## 2011-06-22 ENCOUNTER — Encounter: Payer: Self-pay | Admitting: Internal Medicine

## 2011-06-23 NOTE — Telephone Encounter (Signed)
Patient informed. 

## 2011-07-20 ENCOUNTER — Telehealth: Payer: Self-pay | Admitting: Internal Medicine

## 2011-07-20 NOTE — Telephone Encounter (Signed)
New Refill   Patient ordered Tykosin from mail order [facility], but he is out of his meds.  Patient wants temporary or 30 day supply sent to local pharmacy, until mail order arrives.  He can be reached at [phone] wk should there be any additional question.  FYI: [facility] should be preferred pharmacy going forward.   [id number] - [place], [place] - [address]  (Local Preferred for temp RX)

## 2011-07-21 ENCOUNTER — Other Ambulatory Visit: Payer: Self-pay

## 2011-07-21 ENCOUNTER — Telehealth: Payer: Self-pay | Admitting: Internal Medicine

## 2011-07-21 MED ORDER — DOFETILIDE 250 MCG PO CAPS: 250.0000 ug | ORAL_CAPSULE | Freq: Two times a day (BID) | ORAL | Status: DC

## 2011-07-21 NOTE — Telephone Encounter (Signed)
Follow up on previous call:  Patient calling regarding tikoysin 30 day supply.

## 2011-07-21 NOTE — Telephone Encounter (Signed)
FU call: Pt calling to check on status of tikosyn refill request. Please call RX in asap.

## 2011-07-28 ENCOUNTER — Ambulatory Visit (INDEPENDENT_AMBULATORY_CARE_PROVIDER_SITE_OTHER): Payer: 59 | Admitting: Internal Medicine

## 2011-07-28 ENCOUNTER — Encounter: Payer: Self-pay | Admitting: Internal Medicine

## 2011-07-28 VITALS — BP 110/74 | HR 69 | Ht 74.0 in | Wt 278.4 lb

## 2011-07-28 DIAGNOSIS — G4733 Obstructive sleep apnea (adult) (pediatric): Secondary | ICD-10-CM

## 2011-07-28 DIAGNOSIS — I4891 Unspecified atrial fibrillation: Secondary | ICD-10-CM

## 2011-07-28 MED ORDER — DOFETILIDE 250 MCG PO CAPS: 250.0000 ug | ORAL_CAPSULE | Freq: Two times a day (BID) | ORAL | Status: DC

## 2011-07-28 MED ORDER — DABIGATRAN ETEXILATE MESYLATE 150 MG PO CAPS: 150.0000 mg | ORAL_CAPSULE | Freq: Two times a day (BID) | ORAL | Status: DC

## 2011-07-28 MED ORDER — DILTIAZEM HCL ER COATED BEADS 240 MG PO CP24: ORAL_CAPSULE | ORAL | Status: DC

## 2011-07-28 MED ORDER — METOPROLOL SUCCINATE ER 25 MG PO TB24: 25.0000 mg | ORAL_TABLET | Freq: Every day | ORAL | Status: DC

## 2011-07-28 NOTE — Progress Notes (Signed)
Willow Ora, MD, MD PCP  The patient presents today for routine electrophysiology followup.  Since last being seen in our clinic, the patient reports doing very well.  He is tolerating tikosyn without difficulty.  He appears to be mostly in sinus rhythm but reports having episodes of afib several times per week.   He reports fatigue and decreased exercise tolerance when in afib.  Today, he denies symptoms of chest pain, shortness of breath, orthopnea, PND, lower extremity edema, dizziness, presyncope, syncope, or neurologic sequela.  The patient feels that he is tolerating medications without difficulties and is otherwise without complaint today.   Past Medical History  Diagnosis Date  . Bipolar disorder     used to see psych, on no meds as of 03/2009  . Rhinitis     vasomotor  . Hypothyroidism   . Hyperlipidemia   . Gout   . GERD (gastroesophageal reflux disease)   . Diabetes mellitus   . Persistent atrial fibrillation   . Sleep apnea     mild, now treated with CPAP since ~9-12   Past Surgical History  Procedure Date  . Tonsillectomy   . Vasectomy   . Nasal sinus surgery   . Cardioversion 03/01/11    Current Outpatient Prescriptions  Medication Sig Dispense Refill  . allopurinol (ZYLOPRIM) 300 MG tablet Take 1 tablet (300 mg total) by mouth daily.  90 tablet  1  . dabigatran (PRADAXA) 150 MG CAPS Take 1 capsule (150 mg total) by mouth every 12 (twelve) hours.  180 capsule  3  . diltiazem (CARDIZEM CD) 240 MG 24 hr capsule Take one by mouth twice daily  180 capsule  3  . dofetilide (TIKOSYN) 250 MCG capsule Take 1 capsule (250 mcg total) by mouth 2 (two) times daily.  60 capsule  1  . esomeprazole (NEXIUM) 40 MG capsule Take 1 capsule (40 mg total) by mouth daily before breakfast.  90 capsule  1  . fenofibrate 160 MG tablet Take 1 tablet (160 mg total) by mouth daily.  90 tablet  1  . fluticasone (FLONASE) 50 MCG/ACT nasal spray Place 2 sprays into the nose daily.  48 g  1  .  glimepiride (AMARYL) 2 MG tablet Take 1 tablet (2 mg total) by mouth daily before breakfast.  90 tablet  1  . glucosamine-chondroitin 500-400 MG tablet Take 1 tablet by mouth 4 (four) times daily.        [name] glucose blood (ONE TOUCH ULTRA TEST) test strip Test as directed 1-2 times a day  100 each  1  . levothyroxine (SYNTHROID, LEVOTHROID) 200 MCG tablet Take 1 tablet (200 mcg total) by mouth daily.  90 tablet  1  . metFORMIN (GLUCOPHAGE) 500 MG tablet Take 1 tablet (500 mg total) by mouth 2 (two) times daily with a meal.  180 tablet  1  . metoprolol succinate (TOPROL-XL) 25 MG 24 hr tablet Take 1 tablet (25 mg total) by mouth daily.  30 tablet  6  . Multiple Vitamin (MULTIVITAMIN) capsule Take 1 capsule by mouth daily.        Letta Pate DELICA LANCETS MISC Test a As directed 1-2 times a day  100 each  1  . fish oil-omega-3 fatty acids 1000 MG capsule Take 1 g by mouth 3 (three) times daily.          No Known Allergies  History   Social History  . Marital Status: Married    Spouse Name: N/A  Number of Children: 2  . Years of Education: N/A   Occupational History  . works at [employer] History Main Topics  . Smoking status: Never Smoker   . Smokeless tobacco: Never Used  . Alcohol Use: No  . Drug Use: No  . Sexually Active: Not on file   Other Topics Concern  . Not on file   Social History Narrative   Pt lives in [place] with spouse and children.  Works in Consulting civil engineer at [employer].    Family History  Problem Relation Age of Onset  . Diabetes Father   . Hypertension Brother   . Cancer Brother     melanoma  . Melanoma Brother   . Asthma Daughter   . Allergies Daughter   . Cancer Maternal Uncle     throat  . Throat cancer Maternal Uncle   . Cancer Paternal Grandfather     colon  . Colon cancer Paternal Grandfather    Physical Exam: Filed Vitals:   07/28/11 1014  BP: 110/74  Pulse: 69  Height: 6\' 2"  (1.88 m)  Weight: 278 lb 6.4 oz (126.281 kg)    GEN- The  patient is overweight appearing, alert and oriented x 3 today.   Head- normocephalic, atraumatic Eyes-  Sclera clear, conjunctiva pink Ears- hearing intact Oropharynx- clear Neck- supple, no JVP Lymph- no cervical lymphadenopathy Lungs- Clear to ausculation bilaterally, normal work of breathing Heart- Regular rate and rhythm, no murmurs, rubs or gallops, PMI not laterally displaced GI- soft, NT, ND, + BS Extremities- no clubbing, cyanosis, or edema MS- no significant deformity or atrophy Skin- no rash or lesion Psych- euthymic mood, full affect Neuro- strength and sensation are intact  ekg today reveals sinus rhythm 69 bpm, incomplete RBBB, QTc 445, LVH,   Assessment and Plan:

## 2011-07-28 NOTE — Assessment & Plan Note (Signed)
CPAP encouraged 

## 2011-07-28 NOTE — Assessment & Plan Note (Signed)
Improved with tikosyn Previously persistent afib, now paroxysmal.   Therapeutic strategies for afib including medicine and ablation were discussed in detail with the patient today. Risk, benefits, and alternatives to EP study and radiofrequency ablation for afib were also discussed in detail today. These risks include but are not limited to stroke, bleeding, vascular damage, tamponade, perforation, damage to the esophagus, lungs, and other structures, pulmonary vein stenosis, worsening renal function, and death. The patient understands these risk and would like to further contemplate this option.  Give LA enlargement, anticipated success with ablation is likely on the order of 70%.

## 2011-07-28 NOTE — Patient Instructions (Signed)
Your physician recommends that you schedule a follow-up appointment in 3 months with Dr Allred    

## 2011-08-10 ENCOUNTER — Telehealth: Payer: Self-pay | Admitting: Internal Medicine

## 2011-08-10 MED ORDER — ALLOPURINOL 300 MG PO TABS: 300.0000 mg | ORAL_TABLET | Freq: Every day | ORAL | Status: DC

## 2011-08-10 MED ORDER — LEVOTHYROXINE SODIUM 200 MCG PO TABS: 200.0000 ug | ORAL_TABLET | Freq: Every day | ORAL | Status: DC

## 2011-08-10 MED ORDER — METFORMIN HCL 500 MG PO TABS: 500.0000 mg | ORAL_TABLET | Freq: Two times a day (BID) | ORAL | Status: DC

## 2011-08-10 MED ORDER — ESOMEPRAZOLE MAGNESIUM 40 MG PO CPDR: 40.0000 mg | DELAYED_RELEASE_CAPSULE | Freq: Every day | ORAL | Status: DC

## 2011-08-10 MED ORDER — GLIMEPIRIDE 2 MG PO TABS: 2.0000 mg | ORAL_TABLET | Freq: Every day | ORAL | Status: DC

## 2011-08-10 MED ORDER — FENOFIBRATE 160 MG PO TABS: 160.0000 mg | ORAL_TABLET | Freq: Every day | ORAL | Status: DC

## 2011-08-10 MED ORDER — FLUTICASONE PROPIONATE 50 MCG/ACT NA SUSP: 2.0000 | Freq: Every day | NASAL | Status: DC

## 2011-08-10 NOTE — Telephone Encounter (Signed)
Allopurinol 05-21-11 Last filled #90 1, last OV

## 2011-08-10 NOTE — Telephone Encounter (Signed)
Rx sent 

## 2011-08-10 NOTE — Telephone Encounter (Signed)
Ok RFs

## 2011-08-23 ENCOUNTER — Other Ambulatory Visit: Payer: Self-pay

## 2011-08-23 DIAGNOSIS — I4891 Unspecified atrial fibrillation: Secondary | ICD-10-CM

## 2011-08-23 MED ORDER — METOPROLOL SUCCINATE ER 25 MG PO TB24: 25.0000 mg | ORAL_TABLET | Freq: Every day | ORAL | Status: DC

## 2011-08-23 MED ORDER — DOFETILIDE 250 MCG PO CAPS: 250.0000 ug | ORAL_CAPSULE | Freq: Two times a day (BID) | ORAL | Status: DC

## 2011-09-20 ENCOUNTER — Encounter: Payer: Self-pay | Admitting: Internal Medicine

## 2011-09-20 ENCOUNTER — Ambulatory Visit (INDEPENDENT_AMBULATORY_CARE_PROVIDER_SITE_OTHER): Payer: 59 | Admitting: Internal Medicine

## 2011-09-20 VITALS — BP 132/84 | HR 85 | Temp 98.1°F | Wt 276.0 lb

## 2011-09-20 DIAGNOSIS — E049 Nontoxic goiter, unspecified: Secondary | ICD-10-CM

## 2011-09-20 DIAGNOSIS — E119 Type 2 diabetes mellitus without complications: Secondary | ICD-10-CM

## 2011-09-20 DIAGNOSIS — I4891 Unspecified atrial fibrillation: Secondary | ICD-10-CM

## 2011-09-20 DIAGNOSIS — G4733 Obstructive sleep apnea (adult) (pediatric): Secondary | ICD-10-CM

## 2011-09-20 DIAGNOSIS — E785 Hyperlipidemia, unspecified: Secondary | ICD-10-CM

## 2011-09-20 DIAGNOSIS — E039 Hypothyroidism, unspecified: Secondary | ICD-10-CM

## 2011-09-20 MED ORDER — GLIMEPIRIDE 2 MG PO TABS: 2.0000 mg | ORAL_TABLET | Freq: Every day | ORAL | Status: DC

## 2011-09-20 MED ORDER — GLUCOSAMINE-CHONDROITIN 500-400 MG PO TABS: 1.0000 | ORAL_TABLET | Freq: Four times a day (QID) | ORAL | Status: DC

## 2011-09-20 MED ORDER — ESOMEPRAZOLE MAGNESIUM 40 MG PO CPDR: 40.0000 mg | DELAYED_RELEASE_CAPSULE | Freq: Every day | ORAL | Status: DC

## 2011-09-20 MED ORDER — METOPROLOL SUCCINATE ER 25 MG PO TB24: 25.0000 mg | ORAL_TABLET | Freq: Every day | ORAL | Status: DC

## 2011-09-20 MED ORDER — ONETOUCH DELICA LANCETS MISC: Status: DC

## 2011-09-20 MED ORDER — FENOFIBRATE 160 MG PO TABS: 160.0000 mg | ORAL_TABLET | Freq: Every day | ORAL | Status: DC

## 2011-09-20 MED ORDER — METFORMIN HCL 500 MG PO TABS: 500.0000 mg | ORAL_TABLET | Freq: Two times a day (BID) | ORAL | Status: DC

## 2011-09-20 MED ORDER — DILTIAZEM HCL ER COATED BEADS 240 MG PO CP24: ORAL_CAPSULE | ORAL | Status: DC

## 2011-09-20 MED ORDER — FLUTICASONE PROPIONATE 50 MCG/ACT NA SUSP: 2.0000 | Freq: Every day | NASAL | Status: DC

## 2011-09-20 MED ORDER — GLUCOSE BLOOD VI STRP: ORAL_STRIP | Status: DC

## 2011-09-20 MED ORDER — ALLOPURINOL 300 MG PO TABS: 300.0000 mg | ORAL_TABLET | Freq: Every day | ORAL | Status: DC

## 2011-09-20 MED ORDER — LEVOTHYROXINE SODIUM 200 MCG PO TABS: 200.0000 ug | ORAL_TABLET | Freq: Every day | ORAL | Status: DC

## 2011-09-20 NOTE — Assessment & Plan Note (Signed)
Labs 1-13 AST 31, ALT 50, slightly elevated.  Total cholesterol 201, triglycerides 217, LDL 111.  Plan-- diet, exercise, labs.

## 2011-09-20 NOTE — Assessment & Plan Note (Signed)
U/s normal 2009

## 2011-09-20 NOTE — Patient Instructions (Signed)
Please get the following fasting labs: A1 C--- dx diabetes FLP, AST, ALT--- dx  high cholesterol

## 2011-09-20 NOTE — Assessment & Plan Note (Signed)
TSH 06-2011 normal. No change

## 2011-09-20 NOTE — Assessment & Plan Note (Signed)
Diet and exercise discussed. Feet  exam normal today ; eye exam without retinopathy per patient about 2 months ago. Labs

## 2011-09-20 NOTE — Progress Notes (Signed)
  Subjective:    Patient ID: Colin Wood, male    DOB: 10/21/1960, 51 y.o.   MRN: [id number]  HPI Routine office visit Diabetes, good medication compliance, ambulatory blood sugars as high as 140. Sleep apnea, uses a nose mask, not  feeling completely comfortable with it . Atrial fibrillation, note from cardiology reviewed, thinking about an ablation. Labs from 06-2011  reviewed:  BMP normal  AST 31, ALT 50, slightly elevated.  Total cholesterol 201, triglycerides 217, LDL 111.  Hemoglobin A1c 6.6. TSH 1.3.   Past Medical History:  Diabetes  dx w/ BIPOLAR DISORDER at some point , used to see psych, on no meds as of 03-2009  RHINITIS--vasomotor  HYPOTHYROIDISM  HYPERLIPIDEMIA  GOUT  GERD  Atrial fibrillation dx 2012  [name] dx 2012, on CPAP  Insomnia  Past Surgical History:  Tonsillectomy  Vasectomy  sinus surgery  Social History:  Married , 2 kids  works at [employer]  tobacco-- never  ETOH-- never   Review of Systems    no chest pain, shortness of breath or palpitation. No visual disturbances Diet and exercise needs improvement.  Objective:   Physical Exam General -- alert, well-developed, andslt overweight appearing .   Lungs -- normal respiratory effort, no intercostal retractions, no accessory muscle use, and normal breath sounds.   Heart-- irregularly irregular .     Extremities-- trace  pretibial edema bilaterally w/ bilateral hyperpigmentatio DIABETIC FEET EXAM: Normal pedal pulses bilaterally Skin and nails are normal without calluses Pinprick examination of the feet normal. Neurologic-- alert & oriented X3 and strength normal in all extremities. Psych-- Cognition and judgment appear intact. Alert and cooperative with normal attention span and concentration.  not anxious appearing and not depressed appearing.       Assessment & Plan:

## 2011-09-20 NOTE — Assessment & Plan Note (Addendum)
Does not feel comfortable with her his current mask, encouraged to  explore nasal pillows

## 2011-10-18 ENCOUNTER — Encounter: Payer: Self-pay | Admitting: Internal Medicine

## 2011-10-18 ENCOUNTER — Ambulatory Visit (INDEPENDENT_AMBULATORY_CARE_PROVIDER_SITE_OTHER): Payer: 59 | Admitting: Internal Medicine

## 2011-10-18 VITALS — BP 118/64 | HR 101 | Ht 74.0 in | Wt 281.1 lb

## 2011-10-18 DIAGNOSIS — E669 Obesity, unspecified: Secondary | ICD-10-CM

## 2011-10-18 DIAGNOSIS — G4733 Obstructive sleep apnea (adult) (pediatric): Secondary | ICD-10-CM

## 2011-10-18 DIAGNOSIS — I4891 Unspecified atrial fibrillation: Secondary | ICD-10-CM

## 2011-10-18 DIAGNOSIS — K219 Gastro-esophageal reflux disease without esophagitis: Secondary | ICD-10-CM

## 2011-10-18 MED ORDER — RIVAROXABAN 20 MG PO TABS: 20.0000 mg | ORAL_TABLET | Freq: Every day | ORAL | Status: DC

## 2011-10-18 NOTE — Assessment & Plan Note (Signed)
Improved with tikosyn   Therapeutic strategies for afib including medicine and ablation were discussed in detail with the patient today.  At this point, he wishes to continue his current medical strategy.   Given frequent dyspepsia, I will switch from pradaxa to xarelto 20mg  daily.  If his dyspepsia does not improve, he may benefit from referral to GI.  At this time, he declines referral however.

## 2011-10-18 NOTE — Patient Instructions (Signed)
Your physician recommends that you schedule a follow-up appointment in: 4 months with Dr Johney Frame  Your physician has recommended you make the following change in your medication:  1) Stop Pradaxa  2) Start Xarelto 20mg  daily

## 2011-10-18 NOTE — Progress Notes (Signed)
Colin Ora, MD, MD PCP  The patient presents today for routine electrophysiology followup. He continues to feel that he is in sinus rhythm about 75 % of the time.   He reports fatigue and decreased exercise tolerance when in afib.   Today, he denies symptoms of chest pain, shortness of breath, orthopnea, PND, lower extremity edema, dizziness, presyncope, syncope, or neurologic sequela.   He has not been compliant with lifestyle modification including weight loss.  His wife reports that he has severe dyspepsia for which he sleeps in the recliner at night.  He does not feel that this is worsened by pradaxa.   Past Medical History  Diagnosis Date  . Bipolar disorder     used to see psych, on no meds as of 03/2009  . Rhinitis     vasomotor  . Hypothyroidism   . Hyperlipidemia   . Gout   . GERD (gastroesophageal reflux disease)   . Diabetes mellitus   . Persistent atrial fibrillation   . Sleep apnea     mild, now treated with CPAP since ~9-12   Past Surgical History  Procedure Date  . Tonsillectomy   . Vasectomy   . Nasal sinus surgery   . Cardioversion 03/01/11    Current Outpatient Prescriptions  Medication Sig Dispense Refill  . allopurinol (ZYLOPRIM) 300 MG tablet Take 1 tablet (300 mg total) by mouth daily.  90 tablet  3  . Cholecalciferol (VITAMIN D3) 1000 UNITS CAPS Take 1 capsule by mouth daily.      [name] diltiazem (CARDIZEM CD) 240 MG 24 hr capsule Take one by mouth twice daily  60 capsule  6  . dofetilide (TIKOSYN) 250 MCG capsule Take 1 capsule (250 mcg total) by mouth 2 (two) times daily.  60 capsule  6  . esomeprazole (NEXIUM) 40 MG capsule Take 1 capsule (40 mg total) by mouth daily before breakfast.  90 capsule  1  . fenofibrate 160 MG tablet Take 1 tablet (160 mg total) by mouth daily.  90 tablet  3  . fish oil-omega-3 fatty acids 1000 MG capsule Take 1 g by mouth 3 (three) times daily.        . fluticasone (FLONASE) 50 MCG/ACT nasal spray Place 2 sprays into the nose daily.   48 g  1  . glimepiride (AMARYL) 2 MG tablet Take 1 tablet (2 mg total) by mouth daily before breakfast.  90 tablet  3  . glucosamine-chondroitin 500-400 MG tablet Take 1 tablet by mouth 4 (four) times daily.  180 tablet  2  . glucose blood (ONE TOUCH ULTRA TEST) test strip Test as directed 1-2 times a day  100 each  1  . levothyroxine (SYNTHROID, LEVOTHROID) 200 MCG tablet Take 1 tablet (200 mcg total) by mouth daily.  90 tablet  3  . metFORMIN (GLUCOPHAGE) 500 MG tablet Take 1 tablet (500 mg total) by mouth 2 (two) times daily with a meal.  180 tablet  2  . metoprolol succinate (TOPROL-XL) 25 MG 24 hr tablet Take 1 tablet (25 mg total) by mouth daily.  90 tablet  3  . Multiple Vitamin (MULTIVITAMIN) capsule Take 1 capsule by mouth daily.        Letta Pate DELICA LANCETS MISC Test a As directed 1-2 times a day  100 each  1  . Rivaroxaban (XARELTO) 20 MG TABS Take 20 mg by mouth daily.  90 tablet  3  . DISCONTD: Rivaroxaban (XARELTO) 20 MG TABS Take 20 mg by  mouth daily.  30 tablet  6    No Known Allergies  History   Social History  . Marital Status: Married    Spouse Name: N/A    Number of Children: 2  . Years of Education: N/A   Occupational History  . works at [employer] History Main Topics  . Smoking status: Never Smoker   . Smokeless tobacco: Never Used  . Alcohol Use: No  . Drug Use: No  . Sexually Active: Not on file   Other Topics Concern  . Not on file   Social History Narrative   Pt lives in [place] with spouse and children.  Works in Consulting civil engineer at [employer].    Family History  Problem Relation Age of Onset  . Diabetes Father   . Hypertension Brother   . Cancer Brother     melanoma  . Melanoma Brother   . Asthma Daughter   . Allergies Daughter   . Cancer Maternal Uncle     throat  . Throat cancer Maternal Uncle   . Cancer Paternal Grandfather     colon  . Colon cancer Paternal Grandfather    Physical Exam: Filed Vitals:   10/18/11 1551  BP: 118/64    Pulse: 101  Height: 6\' 2"  (1.88 m)  Weight: 281 lb 1.9 oz (127.515 kg)    GEN- The patient is overweight appearing, alert and oriented x 3 today.   Head- normocephalic, atraumatic Eyes-  Sclera clear, conjunctiva pink Ears- hearing intact Oropharynx- clear Neck- supple, no JVP Lymph- no cervical lymphadenopathy Lungs- Clear to ausculation bilaterally, normal work of breathing Heart- Regular rate and rhythm, no murmurs, rubs or gallops, PMI not laterally displaced GI- soft, NT, ND, + BS Extremities- no clubbing, cyanosis, or edema MS- no significant deformity or atrophy Skin- no rash or lesion Psych- euthymic mood, full affect Neuro- strength and sensation are intact  ekg today reveals afib 100 bpm, incomplete RBBB   Assessment and Plan:

## 2011-10-18 NOTE — Assessment & Plan Note (Signed)
Compliance with CPAP encouraged 

## 2011-10-18 NOTE — Assessment & Plan Note (Signed)
As above.

## 2011-10-18 NOTE — Assessment & Plan Note (Signed)
Weight loss is strongly advised 

## 2012-02-03 ENCOUNTER — Other Ambulatory Visit: Payer: Self-pay

## 2012-02-03 MED ORDER — RIVAROXABAN 20 MG PO TABS: 20.0000 mg | ORAL_TABLET | Freq: Every day | ORAL | Status: DC

## 2012-02-25 ENCOUNTER — Ambulatory Visit (INDEPENDENT_AMBULATORY_CARE_PROVIDER_SITE_OTHER): Payer: 59 | Admitting: Internal Medicine

## 2012-02-25 ENCOUNTER — Encounter: Payer: Self-pay | Admitting: Internal Medicine

## 2012-02-25 VITALS — BP 127/88 | HR 88 | Ht 74.0 in | Wt 280.0 lb

## 2012-02-25 DIAGNOSIS — G4733 Obstructive sleep apnea (adult) (pediatric): Secondary | ICD-10-CM

## 2012-02-25 DIAGNOSIS — E669 Obesity, unspecified: Secondary | ICD-10-CM

## 2012-02-25 DIAGNOSIS — I4891 Unspecified atrial fibrillation: Secondary | ICD-10-CM

## 2012-02-25 DIAGNOSIS — E785 Hyperlipidemia, unspecified: Secondary | ICD-10-CM

## 2012-02-25 DIAGNOSIS — E039 Hypothyroidism, unspecified: Secondary | ICD-10-CM

## 2012-02-25 DIAGNOSIS — E119 Type 2 diabetes mellitus without complications: Secondary | ICD-10-CM

## 2012-02-25 MED ORDER — DOFETILIDE 250 MCG PO CAPS: 250.0000 ug | ORAL_CAPSULE | Freq: Two times a day (BID) | ORAL | Status: DC

## 2012-02-25 MED ORDER — DILTIAZEM HCL ER COATED BEADS 240 MG PO CP24: ORAL_CAPSULE | ORAL | Status: DC

## 2012-02-25 MED ORDER — METOPROLOL SUCCINATE ER 25 MG PO TB24: 25.0000 mg | ORAL_TABLET | Freq: Every day | ORAL | Status: DC

## 2012-02-25 MED ORDER — FUROSEMIDE 20 MG PO TABS: 20.0000 mg | ORAL_TABLET | Freq: Every day | ORAL | Status: DC

## 2012-02-25 MED ORDER — RIVAROXABAN 20 MG PO TABS: 20.0000 mg | ORAL_TABLET | Freq: Every day | ORAL | Status: DC

## 2012-02-25 NOTE — Progress Notes (Signed)
Colin Ora, MD PCP  The patient presents today for routine electrophysiology followup.  He continues to have fatigue.  He has begun to have lower extremity swelling.  Today, he denies symptoms of chest pain, shortness of breath, orthopnea, PND, dizziness, presyncope, syncope, or neurologic sequela.   He has not been compliant with lifestyle modification including weight loss ro CPAP.    Past Medical History  Diagnosis Date  . Bipolar disorder     used to see psych, on no meds as of 03/2009  . Rhinitis     vasomotor  . Hypothyroidism   . Hyperlipidemia   . Gout   . GERD (gastroesophageal reflux disease)   . Diabetes mellitus   . Persistent atrial fibrillation   . Sleep apnea     mild, now treated with CPAP since ~9-12   Past Surgical History  Procedure Date  . Tonsillectomy   . Vasectomy   . Nasal sinus surgery   . Cardioversion 03/01/11    Current Outpatient Prescriptions  Medication Sig Dispense Refill  . allopurinol (ZYLOPRIM) 300 MG tablet Take 1 tablet (300 mg total) by mouth daily.  90 tablet  3  . diltiazem (CARDIZEM CD) 240 MG 24 hr capsule Take one by mouth twice daily  60 capsule  6  . dofetilide (TIKOSYN) 250 MCG capsule Take 1 capsule (250 mcg total) by mouth 2 (two) times daily.  60 capsule  6  . esomeprazole (NEXIUM) 40 MG capsule Take 1 capsule (40 mg total) by mouth daily before breakfast.  90 capsule  1  . fenofibrate 160 MG tablet Take 1 tablet (160 mg total) by mouth daily.  90 tablet  3  . fluticasone (FLONASE) 50 MCG/ACT nasal spray Place 2 sprays into the nose daily.  48 g  1  . glimepiride (AMARYL) 2 MG tablet Take 1 tablet (2 mg total) by mouth daily before breakfast.  90 tablet  3  . glucose blood (ONE TOUCH ULTRA TEST) test strip Test as directed 1-2 times a day  100 each  1  . levothyroxine (SYNTHROID, LEVOTHROID) 200 MCG tablet Take 1 tablet (200 mcg total) by mouth daily.  90 tablet  3  . metFORMIN (GLUCOPHAGE) 500 MG tablet Take 1 tablet (500 mg total)  by mouth 2 (two) times daily with a meal.  180 tablet  2  . metoprolol succinate (TOPROL-XL) 25 MG 24 hr tablet Take 1 tablet (25 mg total) by mouth daily.  90 tablet  3  . ONETOUCH DELICA LANCETS MISC Test a As directed 1-2 times a day  100 each  1  . Rivaroxaban (XARELTO) 20 MG TABS Take 1 tablet (20 mg total) by mouth daily.  90 tablet  3  . Cholecalciferol (VITAMIN D3) 1000 UNITS CAPS Take 1 capsule by mouth daily.      . fish oil-omega-3 fatty acids 1000 MG capsule Take 1 g by mouth 3 (three) times daily.        [name] glucosamine-chondroitin 500-400 MG tablet Take 1 tablet by mouth 4 (four) times daily.  180 tablet  2  . Multiple Vitamin (MULTIVITAMIN) capsule Take 1 capsule by mouth daily.          No Known Allergies  History   Social History  . Marital Status: Married    Spouse Name: N/A    Number of Children: 2  . Years of Education: N/A   Occupational History  . works at [employer] History Main Topics  .  Smoking status: Never Smoker   . Smokeless tobacco: Never Used  . Alcohol Use: No  . Drug Use: No  . Sexually Active: Not on file   Other Topics Concern  . Not on file   Social History Narrative   Pt lives in [place] with spouse and children.  Works in Consulting civil engineer at [employer].    Family History  Problem Relation Age of Onset  . Diabetes Father   . Hypertension Brother   . Cancer Brother     melanoma  . Melanoma Brother   . Asthma Daughter   . Allergies Daughter   . Cancer Maternal Uncle     throat  . Throat cancer Maternal Uncle   . Cancer Paternal Grandfather     colon  . Colon cancer Paternal Grandfather    Physical Exam: Filed Vitals:   02/25/12 0904  BP: 127/88  Pulse: 88  Height: 6\' 2"  (1.88 m)  Weight: 280 lb (127.007 kg)  SpO2: 97%    GEN- The patient is overweight appearing, alert and oriented x 3 today.   Head- normocephalic, atraumatic Eyes-  Sclera clear, conjunctiva pink Ears- hearing intact Oropharynx- clear Neck- supple, no  JVP Lymph- no cervical lymphadenopathy Lungs- Clear to ausculation bilaterally, normal work of breathing Heart- irregular rate and rhythm, no murmurs, rubs or gallops, PMI not laterally displaced GI- soft, NT, ND, + BS Extremities- no clubbing, cyanosis, 1+ BLE edema MS- no significant deformity or atrophy Skin- no rash or lesion Psych- euthymic mood, full affect Neuro- strength and sensation are intact  ekg today reveals afib 90 bpm, incomplete RBBB   Assessment and Plan:

## 2012-02-25 NOTE — Assessment & Plan Note (Signed)
Diabetes control advised Check A1C today

## 2012-02-25 NOTE — Assessment & Plan Note (Signed)
Weight loss advised 

## 2012-02-25 NOTE — Assessment & Plan Note (Signed)
Check TSH 

## 2012-02-25 NOTE — Addendum Note (Signed)
Addended by: Dennis Bast F on: 02/25/2012 11:12 AM   Modules accepted: Orders

## 2012-02-25 NOTE — Assessment & Plan Note (Signed)
CPAP Compliance encouraged Weight loss encouraged

## 2012-02-25 NOTE — Patient Instructions (Addendum)
Your physician recommends that you schedule a follow-up appointment in: 4 weeks with Dr Johney Frame  Your physician has requested that you have an echocardiogram. Echocardiography is a painless test that uses sound waves to create images of your heart. It provides your doctor with information about the size and shape of your heart and how well your heart's chambers and valves are working. This procedure takes approximately one hour. There are no restrictions for this procedure.  Your physician has recommended that you wear a holter monitor. Holter monitors are medical devices that record the heart's electrical activity. Doctors most often use these monitors to diagnose arrhythmias. Arrhythmias are problems with the speed or rhythm of the heartbeat. The monitor is a small, portable device. You can wear one while you do your normal daily activities. This is usually used to diagnose what is causing palpitations/syncope (passing out).---48 hour   Your physician has recommended you make the following change in your medication:  1) Start Furosemide 20mg  daily

## 2012-02-25 NOTE — Assessment & Plan Note (Signed)
I suspect that he is in afib all of the time and that this is the cause for his symptoms. Will place a 48 hour holter to evaluate HR during afib and also to see if he has sinus.  IF no sinus on the monitor then we should stop tikosyn. BMET, Mg today CBC as he is on xarelto. Echo to evaluate for any structural changes related to his afib.   Therapeutic strategies for afib including rate control and rhythm contol were discussed in detail with the patient today. I have advised ablation.  He is reluctant to consider ablation at this time

## 2012-02-25 NOTE — Assessment & Plan Note (Signed)
Check fasting lipids and lfts

## 2012-02-29 ENCOUNTER — Ambulatory Visit (HOSPITAL_COMMUNITY): Payer: 59 | Attending: Cardiology

## 2012-02-29 ENCOUNTER — Encounter (INDEPENDENT_AMBULATORY_CARE_PROVIDER_SITE_OTHER): Payer: 59

## 2012-02-29 DIAGNOSIS — E119 Type 2 diabetes mellitus without complications: Secondary | ICD-10-CM | POA: Insufficient documentation

## 2012-02-29 DIAGNOSIS — I369 Nonrheumatic tricuspid valve disorder, unspecified: Secondary | ICD-10-CM | POA: Insufficient documentation

## 2012-02-29 DIAGNOSIS — I4891 Unspecified atrial fibrillation: Secondary | ICD-10-CM | POA: Insufficient documentation

## 2012-02-29 DIAGNOSIS — I059 Rheumatic mitral valve disease, unspecified: Secondary | ICD-10-CM | POA: Insufficient documentation

## 2012-02-29 DIAGNOSIS — G4733 Obstructive sleep apnea (adult) (pediatric): Secondary | ICD-10-CM | POA: Insufficient documentation

## 2012-02-29 NOTE — Progress Notes (Signed)
Echocardiogram performed.  

## 2012-03-09 ENCOUNTER — Encounter: Payer: Self-pay | Admitting: Internal Medicine

## 2012-03-14 ENCOUNTER — Telehealth: Payer: Self-pay | Admitting: Internal Medicine

## 2012-03-14 NOTE — Telephone Encounter (Signed)
Discussed with pt. Scheduled follow-up appt.

## 2012-03-14 NOTE — Telephone Encounter (Signed)
Labs 03/09/2012 CBC, BMP normal. AST 50.  Triglycerides 315, LDL 95. TSH normal A1c 8.2. Please arrange a visit to discuss results  and adjust his medication, needs better diabetes control

## 2012-03-20 ENCOUNTER — Ambulatory Visit (INDEPENDENT_AMBULATORY_CARE_PROVIDER_SITE_OTHER): Payer: 59 | Admitting: Internal Medicine

## 2012-03-20 ENCOUNTER — Encounter: Payer: Self-pay | Admitting: Internal Medicine

## 2012-03-20 VITALS — BP 114/70 | HR 62 | Ht 74.0 in | Wt 278.0 lb

## 2012-03-20 VITALS — BP 132/84 | HR 79 | Temp 98.2°F | Wt 282.4 lb

## 2012-03-20 DIAGNOSIS — R609 Edema, unspecified: Secondary | ICD-10-CM | POA: Insufficient documentation

## 2012-03-20 DIAGNOSIS — G4733 Obstructive sleep apnea (adult) (pediatric): Secondary | ICD-10-CM

## 2012-03-20 DIAGNOSIS — E119 Type 2 diabetes mellitus without complications: Secondary | ICD-10-CM

## 2012-03-20 DIAGNOSIS — E785 Hyperlipidemia, unspecified: Secondary | ICD-10-CM

## 2012-03-20 DIAGNOSIS — E039 Hypothyroidism, unspecified: Secondary | ICD-10-CM

## 2012-03-20 DIAGNOSIS — I4891 Unspecified atrial fibrillation: Secondary | ICD-10-CM

## 2012-03-20 DIAGNOSIS — E669 Obesity, unspecified: Secondary | ICD-10-CM

## 2012-03-20 MED ORDER — METFORMIN HCL 1000 MG PO TABS: 1000.0000 mg | ORAL_TABLET | Freq: Two times a day (BID) | ORAL | Status: DC

## 2012-03-20 MED ORDER — FLUTICASONE PROPIONATE 50 MCG/ACT NA SUSP: 2.0000 | Freq: Every day | NASAL | Status: DC

## 2012-03-20 MED ORDER — ALLOPURINOL 300 MG PO TABS: 300.0000 mg | ORAL_TABLET | Freq: Every day | ORAL | Status: DC

## 2012-03-20 MED ORDER — GLIMEPIRIDE 2 MG PO TABS: 2.0000 mg | ORAL_TABLET | Freq: Every day | ORAL | Status: DC

## 2012-03-20 MED ORDER — METOPROLOL SUCCINATE ER 50 MG PO TB24: 50.0000 mg | ORAL_TABLET | Freq: Every day | ORAL | Status: DC

## 2012-03-20 MED ORDER — FUROSEMIDE 20 MG PO TABS: 20.0000 mg | ORAL_TABLET | Freq: Every day | ORAL | Status: DC

## 2012-03-20 MED ORDER — LEVOTHYROXINE SODIUM 200 MCG PO TABS: 200.0000 ug | ORAL_TABLET | Freq: Every day | ORAL | Status: DC

## 2012-03-20 MED ORDER — ESOMEPRAZOLE MAGNESIUM 40 MG PO CPDR: 40.0000 mg | DELAYED_RELEASE_CAPSULE | Freq: Every day | ORAL | Status: DC

## 2012-03-20 NOTE — Assessment & Plan Note (Signed)
A1c 03/09/2012 was 8.2. Lifestyle has not been the best in the last few months. We had a long conversation about diet and exercise, he is determined to go back to a healthier lifestyle. Plan: Increase metformin. Recheck in 3 months. Refills sent

## 2012-03-20 NOTE — Assessment & Plan Note (Signed)
He has definitely gained weight, his weight log was reviewed with him. Discussed diet exercise

## 2012-03-20 NOTE — Assessment & Plan Note (Addendum)
Several months history of R>L lower extremity edema, worse than the end of the day. Recent echocardiogram showed EF of more than 50%. Right calf is larger than the left 1 inch, patient is anticoagulated w/ xarelto. I think edema is multifactorial including use CCBs, obesity, inactivity. Recommend: Weight loss, leg elevation 30 minutes twice a day, compression stockings. If not better, we'll have to consider d/c CCBs however he has atrial fibrillation. started Lasix ~2  weeks ago, I asked him to do a BMP and send it to me.

## 2012-03-20 NOTE — Assessment & Plan Note (Signed)
Compliance with CPAP and weight loss advised

## 2012-03-20 NOTE — Progress Notes (Signed)
Colin Ora, MD PCP  The patient presents today for routine electrophysiology followup.  He continues to have fatigue.  His edema is improved.  Today, he denies symptoms of chest pain, shortness of breath, orthopnea, PND, dizziness, presyncope, syncope, or neurologic sequela.   He has not been compliant with lifestyle modification including weight loss ro CPAP.    Past Medical History  Diagnosis Date  . Bipolar disorder     used to see psych, on no meds as of 03/2009  . Rhinitis     vasomotor  . Hypothyroidism   . Hyperlipidemia   . Gout   . GERD (gastroesophageal reflux disease)   . Diabetes mellitus   . Persistent atrial fibrillation   . Sleep apnea     mild, now treated with CPAP since ~9-12   Past Surgical History  Procedure Date  . Tonsillectomy   . Vasectomy   . Nasal sinus surgery   . Cardioversion 03/01/11    Current Outpatient Prescriptions  Medication Sig Dispense Refill  . allopurinol (ZYLOPRIM) 300 MG tablet Take 1 tablet (300 mg total) by mouth daily.  90 tablet  3  . Cholecalciferol (VITAMIN D3) 1000 UNITS CAPS Take 1 capsule by mouth daily.      [name] diltiazem (CARDIZEM CD) 240 MG 24 hr capsule Take one by mouth twice daily  60 capsule  3  . dofetilide (TIKOSYN) 250 MCG capsule Take 1 capsule (250 mcg total) by mouth 2 (two) times daily.  180 capsule  3  . esomeprazole (NEXIUM) 40 MG capsule Take 1 capsule (40 mg total) by mouth daily before breakfast.  90 capsule  1  . fenofibrate 160 MG tablet Take 1 tablet (160 mg total) by mouth daily.  90 tablet  3  . fish oil-omega-3 fatty acids 1000 MG capsule Take 1 g by mouth 3 (three) times daily.        . fluticasone (FLONASE) 50 MCG/ACT nasal spray Place 2 sprays into the nose daily.  48 g  1  . furosemide (LASIX) 20 MG tablet Take 1 tablet (20 mg total) by mouth daily.  90 tablet  3  . glimepiride (AMARYL) 2 MG tablet Take 1 tablet (2 mg total) by mouth daily before breakfast.  90 tablet  3  . glucosamine-chondroitin  500-400 MG tablet Take 1 tablet by mouth 4 (four) times daily.  180 tablet  2  . glucose blood (ONE TOUCH ULTRA TEST) test strip Test as directed 1-2 times a day  100 each  1  . levothyroxine (SYNTHROID, LEVOTHROID) 200 MCG tablet Take 1 tablet (200 mcg total) by mouth daily.  90 tablet  3  . metFORMIN (GLUCOPHAGE) 500 MG tablet Take 1 tablet (500 mg total) by mouth 2 (two) times daily with a meal.  180 tablet  2  . metoprolol succinate (TOPROL-XL) 50 MG 24 hr tablet Take 1 tablet (50 mg total) by mouth daily.  90 tablet  3  . Multiple Vitamin (MULTIVITAMIN) capsule Take 1 capsule by mouth daily.        Letta Pate DELICA LANCETS MISC Test a As directed 1-2 times a day  100 each  1  . Rivaroxaban (XARELTO) 20 MG TABS Take 1 tablet (20 mg total) by mouth daily.  90 tablet  3  . DISCONTD: furosemide (LASIX) 20 MG tablet Take 1 tablet (20 mg total) by mouth daily.  90 tablet  3  . DISCONTD: metoprolol succinate (TOPROL-XL) 25 MG 24 hr tablet Take 1 tablet (  25 mg total) by mouth daily.  90 tablet  3    No Known Allergies  History   Social History  . Marital Status: Married    Spouse Name: N/A    Number of Children: 2  . Years of Education: N/A   Occupational History  . works at [employer] History Main Topics  . Smoking status: Never Smoker   . Smokeless tobacco: Never Used  . Alcohol Use: No  . Drug Use: No  . Sexually Active: Not on file   Other Topics Concern  . Not on file   Social History Narrative   Pt lives in [place] with spouse and children.  Works in Consulting civil engineer at [employer].    Family History  Problem Relation Age of Onset  . Diabetes Father   . Hypertension Brother   . Cancer Brother     melanoma  . Melanoma Brother   . Asthma Daughter   . Allergies Daughter   . Cancer Maternal Uncle     throat  . Throat cancer Maternal Uncle   . Cancer Paternal Grandfather     colon  . Colon cancer Paternal Grandfather    Physical Exam: Filed Vitals:   03/20/12 0937    BP: 114/70  Pulse: 62  Height: 6\' 2"  (1.88 m)  Weight: 278 lb (126.1 kg)  SpO2: 96%    GEN- The patient is overweight appearing, alert and oriented x 3 today.   Head- normocephalic, atraumatic Eyes-  Sclera clear, conjunctiva pink Ears- hearing intact Oropharynx- clear Neck- supple, no JVP Lymph- no cervical lymphadenopathy Lungs- Clear to ausculation bilaterally, normal work of breathing Heart- irregular rate and rhythm, no murmurs, rubs or gallops, PMI not laterally displaced GI- soft, NT, ND, + BS Extremities- no clubbing, cyanosis, 1+ BLE edema MS- no significant deformity or atrophy Skin- no rash or lesion Psych- euthymic mood, full affect Neuro- strength and sensation are intact  holter reviewed with patient today  Assessment and Plan:

## 2012-03-20 NOTE — Assessment & Plan Note (Signed)
48 hour holter reviewed with the patient today.  This reveals 52% afib burden.  Heart rates are 57-168 bpm (average93 bpm) Will increase toprol xl to 50mg  daily today Continue xarelto

## 2012-03-20 NOTE — Patient Instructions (Addendum)
Your physician wants you to follow-up in: 6 months with Dr Jacquiline Doe will receive a reminder letter in the mail two months in advance. If you don't receive a letter, please call our office to schedule the follow-up appointment.   Your physician has recommended you make the following change in your medication:  1) Increase Metoprolol to 50mg  daily

## 2012-03-20 NOTE — Assessment & Plan Note (Signed)
Labs 03/09/2012 Cholesterol 194, HDL 56, LDL 95, triglycerides 315. No change for now

## 2012-03-20 NOTE — Assessment & Plan Note (Signed)
TSH 03/09/2012 was 1.6. No change, refill

## 2012-03-20 NOTE — Progress Notes (Signed)
  Subjective:    Patient ID: Colin Wood, male    DOB: 1960-07-25, 51 y.o.   MRN: [id number]  HPI Today we discussed the following issues Diabetes, good medication compliance, diet and exercise have not been the best lately. No checking his CBGs lately. Atrial fibrillation, status post eval by cardiology. They added Lasix. . Edema is his main concern today, it is bilateral, worse on the end of the day. Denies any shortness or breath or dyspnea on exertion. Occasionally has orthopnea, he thinks related to his abdomen pushing up Hypothyroidism, good medication compliance. Gout, essentially asymptomatic. Denies podagra or pain in the ankle All  labs from 03/09/2012 reviewed with the patient.  Past Medical History:  Diabetes  dx w/ BIPOLAR DISORDER at some point , used to see psych, on no meds as of 03-2009  RHINITIS--vasomotor  HYPOTHYROIDISM  HYPERLIPIDEMIA  GOUT  GERD  Atrial fibrillation dx 2012  [name] dx 2012, on CPAP  Insomnia  Past Surgical History:  Tonsillectomy  Vasectomy  sinus surgery  Social History:  Married , 2 kids  works at [employer]  tobacco-- never  ETOH-- never    Review of Systems See HPI    Objective:   Physical Exam General -- alert, well-developed, and overweight appearing. No apparent distress.  Lungs -- normal respiratory effort, no intercostal retractions, no accessory muscle use, and normal breath sounds.   Abdomen--soft, non-tender, no distention, no masses, no HSM, no guarding, and no rigidity.   Extremities--  Pitting edema bilaterally up to mid pretibial area. calves are nontender, right one is larger by 1 inch. He has hyperpigmentation all the way up to the knees. Femoral and pedal pulses normal. Neurologic-- alert & oriented X3 and strength normal in all extremities. Psych-- Cognition and judgment appear intact. Alert and cooperative with normal attention span and concentration.  not anxious appearing and not depressed appearing.         Assessment & Plan:  Today , I spent more than 25 min with the patient, >50% of the time counseling ( see edema), and  reviewing labs

## 2012-04-12 ENCOUNTER — Encounter: Payer: Self-pay | Admitting: Internal Medicine

## 2012-04-19 ENCOUNTER — Telehealth: Payer: Self-pay | Admitting: Internal Medicine

## 2012-04-19 NOTE — Telephone Encounter (Signed)
It was a Cardiac survey document sent on the 14th and Gesila sent it around and they are calling to follow up because they have not gotten a response

## 2012-04-20 NOTE — Telephone Encounter (Signed)
Just received the form back today from [employer].  They do not fill this out.  I have asked Selena Batten to call the patient and have a release signed and we will be glad to send the records

## 2012-04-24 ENCOUNTER — Telehealth: Payer: Self-pay | Admitting: Internal Medicine

## 2012-04-24 NOTE — Telephone Encounter (Signed)
Called pt this am, No answer Was not able to [employer] on Cell #, Per Tresa Endo I needed to Call  Pt and Let him know Dr.Allred did not have time to Complete this [employer] Paperwork if it needed to be completed Tresa Endo would Print off OV notes and Fax along with paperwork To [facility]. I am Trying to Contact Pt to See if we can get him to Sign Our Release. The Release We have From [facility]  Is Signed Electronic. Will try To call back Later

## 2012-04-28 DIAGNOSIS — Z0279 Encounter for issue of other medical certificate: Secondary | ICD-10-CM

## 2012-06-13 ENCOUNTER — Ambulatory Visit (INDEPENDENT_AMBULATORY_CARE_PROVIDER_SITE_OTHER): Payer: 59 | Admitting: Internal Medicine

## 2012-06-13 ENCOUNTER — Encounter: Payer: Self-pay | Admitting: Internal Medicine

## 2012-06-13 VITALS — BP 128/82 | HR 87 | Temp 98.1°F | Wt 279.0 lb

## 2012-06-13 DIAGNOSIS — J069 Acute upper respiratory infection, unspecified: Secondary | ICD-10-CM

## 2012-06-13 MED ORDER — AZELASTINE HCL 0.1 % NA SOLN: 2.0000 | Freq: Every evening | NASAL | Status: DC | PRN

## 2012-06-13 NOTE — Assessment & Plan Note (Signed)
Symptoms consistent with a URI, will prescribe conservative treatment. He is taking Xarelto, Tikosyn and Cardizem. Mucinex DM does not interact with those medicines. Astelin nasal spray has minimal absorption. If he is not getting better, may need to take amoxicillin. Will avoid Z-Pak given his other medications. See instructions

## 2012-06-13 NOTE — Patient Instructions (Addendum)
Rest, fluids , tylenol For cough, take Mucinex DM twice a day as needed for cough For congestion use  -Saline nasal sprays -astelin nasal spray at night until you feel better Call if no better in few days Call anytime if the symptoms are severe

## 2012-06-13 NOTE — Progress Notes (Signed)
  Subjective:    Patient ID: Colin Wood, male    DOB: December 25, 1960, 52 y.o.   MRN: [id number]  HPI Acute visit One-day history of nose congestion with clear-yellow-greenish discharge. Also some cough without sputum production, he believes due to to nasal discharge. No sore throat. Taking cetrizine  OTC. Patient is concerned about potential interaction of OTC meds with his regular medications   Past Medical History  Diagnosis Date  . Bipolar disorder     used to see psych, on no meds as of 03/2009  . Rhinitis     vasomotor  . Hypothyroidism   . Hyperlipidemia   . Gout   . GERD (gastroesophageal reflux disease)   . Diabetes mellitus   . Persistent atrial fibrillation   . Sleep apnea     mild, now treated with CPAP since ~9-12    Past Surgical History  Procedure Date  . Tonsillectomy   . Vasectomy   . Nasal sinus surgery   . Cardioversion 03/01/11    Review of Systems No fever chills No nausea or vomiting No myalgias    Objective:   Physical Exam General -- alert, well-developed  HEENT -- TMs normal, throat w/o redness, face symmetric and not tender to palpation, nose congested  Lungs -- normal respiratory effort, no intercostal retractions, no accessory muscle use, and normal breath sounds.   Heart-- normal rate, regular rhythm, no murmur, and no gallop.    Psych-- Cognition and judgment appear intact. Alert and cooperative with normal attention span and concentration.  not anxious appearing and not depressed appearing.       Assessment & Plan:

## 2012-06-15 ENCOUNTER — Telehealth: Payer: Self-pay | Admitting: Internal Medicine

## 2012-06-15 NOTE — Telephone Encounter (Signed)
[employer], papers faxed to [phone] 06/15/12/km

## 2012-06-15 NOTE — Telephone Encounter (Signed)
New problem:   Status of fax that was sent x 4 times. Due to life insurance . They only have 2 months patient policy is about to be close.  Colin Wood will be re-faxing form over again.

## 2012-06-15 NOTE — Telephone Encounter (Signed)
Faxed over notes

## 2012-07-03 ENCOUNTER — Ambulatory Visit: Payer: 59 | Admitting: Internal Medicine

## 2012-07-07 ENCOUNTER — Telehealth: Payer: Self-pay | Admitting: Pulmonary Disease

## 2012-07-07 NOTE — Telephone Encounter (Signed)
ashtyn have you seen a CMN on this pt?

## 2012-07-10 NOTE — Telephone Encounter (Signed)
I  Do have cmn, per Dr Shelle Iron would not sign until pt has ov. Pt has been informed, appt sched 07/28/12. Will give cmn to Dr Shelle Iron nurse to see if he will sign.  Ashtyn can you see if Dr Shelle Iron will sign this cmn?

## 2012-07-10 NOTE — Telephone Encounter (Signed)
This has been placed in Dr Shelle Iron [facility] folder to review this weekend (per [facility])

## 2012-07-10 NOTE — Telephone Encounter (Signed)
Alida, have you seen a CMN for this patient?

## 2012-07-28 ENCOUNTER — Other Ambulatory Visit: Payer: Self-pay | Admitting: Pulmonary Disease

## 2012-07-28 ENCOUNTER — Encounter: Payer: Self-pay | Admitting: Pulmonary Disease

## 2012-07-28 ENCOUNTER — Ambulatory Visit (INDEPENDENT_AMBULATORY_CARE_PROVIDER_SITE_OTHER): Payer: 59 | Admitting: Pulmonary Disease

## 2012-07-28 VITALS — BP 118/88 | HR 66 | Temp 98.0°F | Ht 74.0 in | Wt 278.6 lb

## 2012-07-28 DIAGNOSIS — G4733 Obstructive sleep apnea (adult) (pediatric): Secondary | ICD-10-CM

## 2012-07-28 NOTE — Assessment & Plan Note (Signed)
The patient is doing well with CPAP, and feels that he is sleeping better with increased daytime alertness.  His only complaint today is the pressure is not high enough when he first starts wearing the CPAP mask.  He is currently on the office setting, and would like to try a fixed pressure.  His optimal pressure appears to be 10 cm.  I have encouraged him to work aggressively on weight loss, and also keep up with his mask changes and supplies.

## 2012-07-28 NOTE — Patient Instructions (Addendum)
Will have your pressure set on a fixed level of 10cm.  Let us know if this isn't working for you  Work on weight loss followup with me in one year if doing well.

## 2012-07-28 NOTE — Progress Notes (Signed)
  Subjective:    Patient ID: Colin Wood, male    DOB: 07/06/1960, 52 y.o.   MRN: [id number]  HPI The patient comes in today for followup of his obstructive sleep apnea.  He has been wearing his CPAP device compliantly by the download, and is currently on the auto setting.  His optimal pressure appears to be 10 cm.  He feels that he is sleeping well with the device, and no one has commented on breakthrough snoring.  He is adequately rested in the mornings upon arising.  Of note, his weight is unchanged from the prior visit.   Review of Systems  Constitutional: Negative for fever and unexpected weight change.  HENT: Positive for congestion, sneezing and sinus pressure. Negative for ear pain, nosebleeds, sore throat, rhinorrhea, trouble swallowing, dental problem and postnasal drip.   Eyes: Negative for redness and itching.  Respiratory: Negative for cough, chest tightness, shortness of breath and wheezing.   Cardiovascular: Positive for leg swelling. Negative for palpitations.  Gastrointestinal: Negative for nausea and vomiting.  Genitourinary: Negative for dysuria.  Musculoskeletal: Negative for joint swelling.  Skin: Negative for rash.  Neurological: Negative for headaches.  Hematological: Does not bruise/bleed easily.  Psychiatric/Behavioral: Negative for dysphoric mood. The patient is not nervous/anxious.        Objective:   Physical Exam Overweight male in no acute distress No skin breakdown or pressure necrosis from the CPAP mask Nose without purulence or discharge noted Neck without lymphadenopathy or thyromegaly Lower extremities without edema, cyanosis Alert and oriented, moves all 4 extremities.  Does not appear to be sleepy.        Assessment & Plan:

## 2012-08-23 ENCOUNTER — Encounter: Payer: Self-pay | Admitting: Internal Medicine

## 2012-08-28 ENCOUNTER — Other Ambulatory Visit: Payer: Self-pay | Admitting: *Deleted

## 2012-08-28 MED ORDER — METFORMIN HCL 1000 MG PO TABS: 1000.0000 mg | ORAL_TABLET | Freq: Two times a day (BID) | ORAL | Status: DC

## 2012-08-28 NOTE — Telephone Encounter (Signed)
Rx sent 

## 2012-08-29 ENCOUNTER — Ambulatory Visit: Payer: 59 | Admitting: Internal Medicine

## 2012-10-24 ENCOUNTER — Telehealth: Payer: Self-pay | Admitting: Internal Medicine

## 2012-10-24 DIAGNOSIS — E119 Type 2 diabetes mellitus without complications: Secondary | ICD-10-CM

## 2012-10-24 DIAGNOSIS — E039 Hypothyroidism, unspecified: Secondary | ICD-10-CM

## 2012-10-24 NOTE — Telephone Encounter (Signed)
Patient called requesting lab orders. He has his labs done at his work and he is requesting to be tested for anything Dr. Drue Novel thinks he will need because he does not have to pay for it. He will come up these orders. CB# [phone]

## 2012-10-25 NOTE — Telephone Encounter (Signed)
Pt called again stating that he has an appt to get these labs drawn on 10-27-12. Pt has CPE appt with Paz on 11-03-12.

## 2012-10-26 NOTE — Telephone Encounter (Signed)
1. Schedule office visit to discuss results  2. Labs: FLP, CMP, A1c, -- dx  Diabetes TSH --- dx hypothyroidism

## 2012-10-26 NOTE — Addendum Note (Signed)
Addended by: Edwena Felty T on: 10/26/2012 03:02 PM   Modules accepted: Orders

## 2012-10-26 NOTE — Telephone Encounter (Signed)
Pt made aware lab orders are ready to be picked up at front desk.

## 2012-11-03 ENCOUNTER — Ambulatory Visit (INDEPENDENT_AMBULATORY_CARE_PROVIDER_SITE_OTHER): Payer: 59 | Admitting: Internal Medicine

## 2012-11-03 ENCOUNTER — Encounter: Payer: Self-pay | Admitting: Internal Medicine

## 2012-11-03 VITALS — BP 120/82 | HR 70 | Temp 98.2°F | Ht 74.0 in | Wt 281.0 lb

## 2012-11-03 DIAGNOSIS — Z0000 Encounter for general adult medical examination without abnormal findings: Secondary | ICD-10-CM

## 2012-11-03 DIAGNOSIS — E039 Hypothyroidism, unspecified: Secondary | ICD-10-CM

## 2012-11-03 DIAGNOSIS — E049 Nontoxic goiter, unspecified: Secondary | ICD-10-CM

## 2012-11-03 DIAGNOSIS — M109 Gout, unspecified: Secondary | ICD-10-CM

## 2012-11-03 DIAGNOSIS — E119 Type 2 diabetes mellitus without complications: Secondary | ICD-10-CM

## 2012-11-03 DIAGNOSIS — E785 Hyperlipidemia, unspecified: Secondary | ICD-10-CM

## 2012-11-03 NOTE — Assessment & Plan Note (Addendum)
TD 09  zostavax discussed  Cscope 08-2005: int.  hemorrhoids, next 2017 rec iFOB (Rx provided, likes to do it at work) diet -exercise diiscussed and encourage prostate   Asymmetric, on 03-2010, left side it was felt to be slightly enlarged, today it is smaller but firm, PSA 02-2009 1.0. last PSA was 1.1 on 05/2010.   Plan: PSA, refer to urology. Likes to go to do all his labs elsewhere

## 2012-11-03 NOTE — Assessment & Plan Note (Signed)
Good compliance with Synthroid, recent TSH 0.7. No change. Thyromegaly on exam?  Thyroid ultrasound in 2009 Show the gland was in the upper normal limits. Plan: Ultrasound.

## 2012-11-03 NOTE — Progress Notes (Signed)
Subjective:    Patient ID: Colin Wood, male    DOB: 22-Aug-1960, 52 y.o.   MRN: [id number]  HPI Here for a CPX, he also liked to discuss his chronic  medical issues.  Past Medical History  Diagnosis Date  . Bipolar disorder     used to see psych, on no meds as of 03/2009  . Rhinitis     vasomotor  . Hypothyroidism   . Hyperlipidemia   . Gout   . GERD (gastroesophageal reflux disease)   . Diabetes mellitus   . Persistent atrial fibrillation   . Sleep apnea     mild, now treated with CPAP since ~9-12   Past Surgical History  Procedure Laterality Date  . Tonsillectomy    . Vasectomy    . Nasal sinus surgery    . Cardioversion  03/01/11   History   Social History  . Marital Status: Married    Spouse Name: N/A    Number of Children: 2  . Years of Education: N/A   Occupational History  . works at [employer] History Main Topics  . Smoking status: Never Smoker   . Smokeless tobacco: Never Used  . Alcohol Use: No  . Drug Use: No  . Sexually Active: Not on file   Other Topics Concern  . Not on file   Social History Narrative   Pt lives in [place] with spouse and children 66 and 11 y/o.  Works in Consulting civil engineer at [employer].   Family History  Problem Relation Age of Onset  . Diabetes Father   . Hypertension Brother   . Melanoma Brother   . Asthma Daughter   . Allergies Daughter   . Cancer Maternal Uncle     throat  . Throat cancer Maternal Uncle   . Colon cancer Paternal Grandfather   . Prostate cancer Neg Hx      Review of Systems Diabetes, not doing well with diet, not exercising, states is very busy at work. Ambulatory blood sugars in the 120, 160 range. Denies blurred vision or polyuria Gout, good compliance with allopurinol, hardly ever has very mild podagra. Hypothyroidism, good medication compliance. Dyslipidemia, good medication compliance. No chest pain, shortness of breath or palpitations. No ambulatory BPs. Occasionally sees red blood per  rectum usually associated with large BMs, some discomfort. Has a history of internal hemorrhoids per colonoscopy.     Objective:   Physical Exam BP 120/82  Pulse 70  Temp(Src) 98.2 F (36.8 C) (Oral)  Ht 6\' 2"  (1.88 m)  Wt 281 lb (127.461 kg)  BMI 36.06 kg/m2  SpO2 97%  General -- alert, well-developed, NAD.   Neck -- Slightly enlarged thyroid on the right side? Not tender or nodular. Lungs -- normal respiratory effort, no intercostal retractions, no accessory muscle use, and normal breath sounds.   Heart-- normal rate, regular rhythm, no murmur, and no gallop.   Abdomen--soft, non-tender, no distention, no masses, no HSM, no guarding, and no rigidity.   DIABETIC FEET EXAM: No lower extremity edema Normal pedal pulses bilaterally Skin and nails are normal without calluses Pinprick examination of the feet normal. Rectal-- +external hemorrhoid noted. Normal sphincter tone. No rectal masses or tenderness. Brown stool  Prostate:   Asymmetric, slightly smaller on the left side but more firm compared to a right. Not tender Neurologic-- alert & oriented X3 and strength normal in all extremities. Psych-- Cognition and judgment appear intact. Alert and cooperative with normal attention span and  concentration.  not anxious appearing and not depressed appearing.       Assessment & Plan:

## 2012-11-03 NOTE — Assessment & Plan Note (Signed)
Labs from 10/29/2011: Total cholesterol 199, triglycerides 500, HDL 32.  previous triglycerides 02-2012 ---> 315 Good compliance with fenofibrate. Recommend diet, exercise. Recheck in 3 months.

## 2012-11-03 NOTE — Assessment & Plan Note (Signed)
Well-controlled all allopurinol, very rarely has symptoms check a uric acid. ALT is slightly elevated.

## 2012-11-03 NOTE — Assessment & Plan Note (Signed)
Feet exam normal today. A1c 10/27/2012 ----7.3 Good compliance of medication, his diet and exercise and not good at all. Plan: Work on diet-exercise and increase glimeperide  to 4 mg daily.

## 2012-11-03 NOTE — Patient Instructions (Addendum)
Next visit 3 months  

## 2012-11-04 ENCOUNTER — Encounter: Payer: Self-pay | Admitting: Internal Medicine

## 2012-11-04 MED ORDER — FENOFIBRATE 160 MG PO TABS: 160.0000 mg | ORAL_TABLET | Freq: Every day | ORAL | Status: DC

## 2012-11-04 MED ORDER — ESOMEPRAZOLE MAGNESIUM 40 MG PO CPDR: 40.0000 mg | DELAYED_RELEASE_CAPSULE | Freq: Every day | ORAL | Status: DC

## 2012-11-04 MED ORDER — GLIMEPIRIDE 4 MG PO TABS: 4.0000 mg | ORAL_TABLET | Freq: Every day | ORAL | Status: DC

## 2012-11-04 MED ORDER — FUROSEMIDE 20 MG PO TABS: 20.0000 mg | ORAL_TABLET | Freq: Every day | ORAL | Status: DC

## 2012-11-04 MED ORDER — LEVOTHYROXINE SODIUM 200 MCG PO TABS: 200.0000 ug | ORAL_TABLET | Freq: Every day | ORAL | Status: DC

## 2012-11-04 MED ORDER — METFORMIN HCL 1000 MG PO TABS: 1000.0000 mg | ORAL_TABLET | Freq: Two times a day (BID) | ORAL | Status: DC

## 2012-11-04 MED ORDER — ALLOPURINOL 300 MG PO TABS: 300.0000 mg | ORAL_TABLET | Freq: Every day | ORAL | Status: DC

## 2012-11-06 ENCOUNTER — Telehealth: Payer: Self-pay | Admitting: Internal Medicine

## 2012-11-06 NOTE — Telephone Encounter (Signed)
PSA 1.0 Hepatitis C negative, hepatitis B. surface antigen and hep B core negative Uric acid 5.1. Advise patient, all labs are very good.

## 2012-11-07 NOTE — Telephone Encounter (Signed)
Discussed with pt

## 2012-11-08 ENCOUNTER — Encounter: Payer: Self-pay | Admitting: Internal Medicine

## 2012-11-08 ENCOUNTER — Ambulatory Visit (INDEPENDENT_AMBULATORY_CARE_PROVIDER_SITE_OTHER): Payer: 59 | Admitting: Internal Medicine

## 2012-11-08 VITALS — BP 122/83 | HR 131 | Ht 74.0 in | Wt 278.0 lb

## 2012-11-08 DIAGNOSIS — E669 Obesity, unspecified: Secondary | ICD-10-CM

## 2012-11-08 DIAGNOSIS — G4733 Obstructive sleep apnea (adult) (pediatric): Secondary | ICD-10-CM

## 2012-11-08 DIAGNOSIS — I4892 Unspecified atrial flutter: Secondary | ICD-10-CM

## 2012-11-08 DIAGNOSIS — I4891 Unspecified atrial fibrillation: Secondary | ICD-10-CM

## 2012-11-08 MED ORDER — RIVAROXABAN 20 MG PO TABS: 20.0000 mg | ORAL_TABLET | Freq: Every day | ORAL | Status: DC

## 2012-11-08 MED ORDER — METOPROLOL SUCCINATE ER 100 MG PO TB24: 100.0000 mg | ORAL_TABLET | Freq: Every day | ORAL | Status: DC

## 2012-11-08 MED ORDER — DILTIAZEM HCL ER COATED BEADS 240 MG PO CP24: ORAL_CAPSULE | ORAL | Status: DC

## 2012-11-08 MED ORDER — FUROSEMIDE 20 MG PO TABS: 20.0000 mg | ORAL_TABLET | Freq: Every day | ORAL | Status: DC

## 2012-11-08 MED ORDER — DOFETILIDE 250 MCG PO CAPS: 250.0000 ug | ORAL_CAPSULE | Freq: Two times a day (BID) | ORAL | Status: DC

## 2012-11-08 NOTE — Progress Notes (Signed)
Colin Ora, MD PCP  The patient presents today for routine electrophysiology followup.  He continues to have fatigue.  His edema is improved.  He has frequent tachypalpitations with documented heart rates at home 130s.  Today, he denies symptoms of chest pain, shortness of breath, orthopnea, PND, dizziness, presyncope, syncope, or neurologic sequela.   He has not been compliant with lifestyle modification including weight loss ro CPAP.    Past Medical History  Diagnosis Date  . Bipolar disorder     used to see psych, on no meds as of 03/2009  . Rhinitis     vasomotor  . Hypothyroidism   . Hyperlipidemia   . Gout   . GERD (gastroesophageal reflux disease)   . Diabetes mellitus   . Persistent atrial fibrillation   . Sleep apnea     mild, now treated with CPAP since ~9-12  . Atrial flutter     typical appearing   Past Surgical History  Procedure Laterality Date  . Tonsillectomy    . Vasectomy    . Nasal sinus surgery    . Cardioversion  03/01/11    Current Outpatient Prescriptions  Medication Sig Dispense Refill  . allopurinol (ZYLOPRIM) 300 MG tablet Take 1 tablet (300 mg total) by mouth daily.  90 tablet  1  . diltiazem (CARDIZEM CD) 240 MG 24 hr capsule Take one by mouth twice daily  60 capsule  3  . dofetilide (TIKOSYN) 250 MCG capsule Take 1 capsule (250 mcg total) by mouth 2 (two) times daily.  180 capsule  3  . esomeprazole (NEXIUM) 40 MG capsule Take 1 capsule (40 mg total) by mouth daily before breakfast.  90 capsule  1  . fenofibrate 160 MG tablet Take 1 tablet (160 mg total) by mouth daily.  90 tablet  1  . furosemide (LASIX) 20 MG tablet Take 1 tablet (20 mg total) by mouth daily.  90 tablet  1  . glimepiride (AMARYL) 4 MG tablet Take 1 tablet (4 mg total) by mouth daily before breakfast.  90 tablet  1  . glucose blood (ONE TOUCH ULTRA TEST) test strip Test as directed 1-2 times a day  100 each  1  . levothyroxine (SYNTHROID, LEVOTHROID) 200 MCG tablet Take 1 tablet (200  mcg total) by mouth daily.  90 tablet  3  . metFORMIN (GLUCOPHAGE) 1000 MG tablet Take 1 tablet (1,000 mg total) by mouth 2 (two) times daily with a meal.  180 tablet  3  . metoprolol succinate (TOPROL-XL) 50 MG 24 hr tablet Take 1 tablet (50 mg total) by mouth daily.  90 tablet  3  . ONETOUCH DELICA LANCETS MISC Test a As directed 1-2 times a day  100 each  1  . Rivaroxaban (XARELTO) 20 MG TABS Take 1 tablet (20 mg total) by mouth daily.  90 tablet  3   No current facility-administered medications for this visit.    No Known Allergies  History   Social History  . Marital Status: Married    Spouse Name: N/A    Number of Children: 2  . Years of Education: N/A   Occupational History  . works at [employer] History Main Topics  . Smoking status: Never Smoker   . Smokeless tobacco: Never Used  . Alcohol Use: No  . Drug Use: No  . Sexually Active: Not on file   Other Topics Concern  . Not on file   Social History Narrative   Pt  lives in [place] with spouse and children 42 and 39 y/o.  Works in Consulting civil engineer at [employer].    Family History  Problem Relation Age of Onset  . Diabetes Father   . Hypertension Brother   . Melanoma Brother   . Asthma Daughter   . Allergies Daughter   . Cancer Maternal Uncle     throat  . Throat cancer Maternal Uncle   . Colon cancer Paternal Grandfather   . Prostate cancer Neg Hx    Physical Exam: Filed Vitals:   11/08/12 0936  BP: 122/83  Pulse: 131  Height: 6\' 2"  (1.88 m)  Weight: 278 lb (126.1 kg)    GEN- The patient is overweight appearing, alert and oriented x 3 today.   Head- normocephalic, atraumatic Eyes-  Sclera clear, conjunctiva pink Ears- hearing intact Oropharynx- clear Neck- supple, no JVP Lymph- no cervical lymphadenopathy Lungs- Clear to ausculation bilaterally, normal work of breathing Heart- tachycardic irregular rhythm, no murmurs, rubs or gallops, PMI not laterally displaced GI- soft, NT, ND, + BS Extremities-  no clubbing, cyanosis, trace BLE edema MS- no significant deformity or atrophy Skin- no rash or lesion Psych- euthymic mood, full affect Neuro- strength and sensation are intact  ekg today reveals typical appearing atrial flutter with 2:1 AV conduction, V rate 130 bpm  Assessment and Plan:  1. Afib/ atrial flutter V rates remain poorly controlled. I had a very frank conversation today regarding the importance of controlling this.  Therapeutic strategies for afib/ atrial flutter including medicine and ablation were discussed in detail with the patient today. Risk, benefits, and alternatives to EP study and radiofrequency ablation were also discussed in detail today.  Our only other medicine option would be amiodarone.  I discussed risks of amiodarone today and strongly have advised ablation. He will continue to contemplate this option and will call my office once he has made a decision. Continue anticoagulation Increase toprol to 100mg  daily today  2. [name] Compliance with CPAP is advised  3. Obesity Weight loss is advised  He will contact me if he decides to proceed with ablation or a different AAD Otherwise, he will return in 4 weeks

## 2012-11-08 NOTE — Patient Instructions (Addendum)
Your physician recommends that you schedule a follow-up appointment in: 4 weeks with Dr Johney Frame  Your physician has recommended that you have an ablation. Catheter ablation is a medical procedure used to treat some cardiac arrhythmias (irregular heartbeats). During catheter ablation, a long, thin, flexible tube is put into a blood vessel in your groin (upper thigh), or neck. This tube is called an ablation catheter. It is then guided to your heart through the blood vessel. Radio frequency waves destroy small areas of heart tissue where abnormal heartbeats may cause an arrhythmia to start. Please see the instruction sheet given to you today.  Your physician has recommended you make the following change in your medication:  1) Increase Metoprolol to 100mg  daily

## 2012-11-10 ENCOUNTER — Encounter: Payer: Self-pay | Admitting: *Deleted

## 2012-11-10 ENCOUNTER — Ambulatory Visit
Admission: RE | Admit: 2012-11-10 | Discharge: 2012-11-10 | Disposition: A | Discharge status: Home / Self Care | Payer: 59 | Source: Ambulatory Visit | Attending: Internal Medicine | Admitting: Internal Medicine

## 2012-11-10 DIAGNOSIS — E049 Nontoxic goiter, unspecified: Secondary | ICD-10-CM

## 2012-11-13 ENCOUNTER — Encounter: Payer: Self-pay | Admitting: Internal Medicine

## 2012-11-24 ENCOUNTER — Telehealth: Payer: Self-pay | Admitting: Internal Medicine

## 2012-11-24 NOTE — Telephone Encounter (Signed)
Spoke with patient to inform him that Dennis Bast, RN and Dr. Johney Frame are out of the office today and will call him Monday to schedule this procedure.  Patient verbalized understanding and agreement with plan.

## 2012-11-24 NOTE — Telephone Encounter (Signed)
New Prob     Pt is ready to schedule his ablation. Please call.

## 2012-11-26 NOTE — Telephone Encounter (Signed)
Colin Wood,  Please schedule afib ablation.

## 2012-11-28 ENCOUNTER — Encounter: Payer: Self-pay | Admitting: *Deleted

## 2012-11-28 NOTE — Telephone Encounter (Signed)
Follow-up:    Patient called in wanting to schedule his procedure.  Please call back.

## 2012-11-28 NOTE — Telephone Encounter (Signed)
Called patient and set up atrial fib ablation with anesthesia,Carto, and Ice for 01/05/2013 530am for 730am procedure. Patient would like to have lab work completed at [facility]. Will place order for lab in the mail to patient's home. He would like me to cancell his July appointment because he is setting up the ablation. Mailed instruction sheet to him also.

## 2012-12-04 ENCOUNTER — Telehealth: Payer: Self-pay | Admitting: Internal Medicine

## 2012-12-04 ENCOUNTER — Ambulatory Visit: Payer: 59 | Admitting: Internal Medicine

## 2012-12-04 NOTE — Telephone Encounter (Signed)
lmtcb

## 2012-12-04 NOTE — Telephone Encounter (Signed)
New problem  Pt's wife wants to speak with you regarding his procedure that he is scheduled to have.

## 2012-12-05 NOTE — Telephone Encounter (Signed)
Follow-up:    Patient called in returning Jodette's call.  Please call back.

## 2012-12-05 NOTE — Telephone Encounter (Signed)
Called patient's wife and answered questions about his planned ablation.

## 2012-12-05 NOTE — Telephone Encounter (Signed)
Left message for pt to call.

## 2012-12-19 ENCOUNTER — Other Ambulatory Visit: Payer: Self-pay | Admitting: *Deleted

## 2012-12-19 DIAGNOSIS — I4891 Unspecified atrial fibrillation: Secondary | ICD-10-CM

## 2012-12-27 ENCOUNTER — Telehealth: Payer: Self-pay | Admitting: Internal Medicine

## 2012-12-27 NOTE — Telephone Encounter (Signed)
New Prob     Pts wife has some questions regarding TEE procedure. Please call.

## 2012-12-27 NOTE — Telephone Encounter (Signed)
New prob  Pt would like to speak with you regarding the letter you sent him.

## 2012-12-27 NOTE — Telephone Encounter (Addendum)
Patient called wanting to know if procedure had been threw pre-cert.  I transferred him to [employer] in pre-cert

## 2012-12-27 NOTE — Telephone Encounter (Signed)
Spoke with wife who states she nor her husband remember anything about a TEE being explained to them.  i explained to her that this is done prior to every ablation to look for clots.  She understands and ask that I call husband also.  I have left a message for patient and ask if he has any more questions to feel free to call me back

## 2012-12-27 NOTE — Telephone Encounter (Signed)
New prob  Pt has a question regarding his procedure that is scheduled.

## 2012-12-29 ENCOUNTER — Encounter (HOSPITAL_COMMUNITY): Payer: Self-pay | Admitting: Pharmacy Technician

## 2012-12-29 ENCOUNTER — Encounter: Payer: Self-pay | Admitting: Cardiology

## 2013-01-02 ENCOUNTER — Telehealth: Payer: Self-pay | Admitting: Internal Medicine

## 2013-01-02 NOTE — Telephone Encounter (Signed)
New Prob  Pt wants to know if you received his lab results and he wants to confirm that he can take his medication on August 7 and August 8

## 2013-01-02 NOTE — Telephone Encounter (Signed)
Patient aware of labs received and to proceed with procedure

## 2013-01-04 ENCOUNTER — Encounter (HOSPITAL_COMMUNITY): Payer: Self-pay

## 2013-01-04 ENCOUNTER — Ambulatory Visit (HOSPITAL_COMMUNITY)
Admission: RE | Admit: 2013-01-04 | Discharge: 2013-01-04 | Disposition: A | Discharge status: Home / Self Care | Payer: 59 | Source: Ambulatory Visit | Attending: Internal Medicine | Admitting: Internal Medicine

## 2013-01-04 ENCOUNTER — Telehealth: Payer: Self-pay | Admitting: Internal Medicine

## 2013-01-04 ENCOUNTER — Encounter (HOSPITAL_COMMUNITY)
Admission: RE | Disposition: A | Discharge status: Home / Self Care | Payer: Self-pay | Source: Ambulatory Visit | Attending: Internal Medicine

## 2013-01-04 DIAGNOSIS — I4891 Unspecified atrial fibrillation: Secondary | ICD-10-CM

## 2013-01-04 DIAGNOSIS — E785 Hyperlipidemia, unspecified: Secondary | ICD-10-CM | POA: Insufficient documentation

## 2013-01-04 DIAGNOSIS — G4730 Sleep apnea, unspecified: Secondary | ICD-10-CM | POA: Insufficient documentation

## 2013-01-04 DIAGNOSIS — E119 Type 2 diabetes mellitus without complications: Secondary | ICD-10-CM | POA: Insufficient documentation

## 2013-01-04 DIAGNOSIS — Z7901 Long term (current) use of anticoagulants: Secondary | ICD-10-CM | POA: Insufficient documentation

## 2013-01-04 DIAGNOSIS — I4892 Unspecified atrial flutter: Secondary | ICD-10-CM | POA: Insufficient documentation

## 2013-01-04 DIAGNOSIS — E039 Hypothyroidism, unspecified: Secondary | ICD-10-CM | POA: Insufficient documentation

## 2013-01-04 DIAGNOSIS — K219 Gastro-esophageal reflux disease without esophagitis: Secondary | ICD-10-CM | POA: Insufficient documentation

## 2013-01-04 DIAGNOSIS — M109 Gout, unspecified: Secondary | ICD-10-CM | POA: Insufficient documentation

## 2013-01-04 DIAGNOSIS — F319 Bipolar disorder, unspecified: Secondary | ICD-10-CM | POA: Insufficient documentation

## 2013-01-04 DIAGNOSIS — E663 Overweight: Secondary | ICD-10-CM | POA: Insufficient documentation

## 2013-01-04 DIAGNOSIS — Q211 Atrial septal defect: Secondary | ICD-10-CM | POA: Insufficient documentation

## 2013-01-04 DIAGNOSIS — J310 Chronic rhinitis: Secondary | ICD-10-CM | POA: Insufficient documentation

## 2013-01-04 DIAGNOSIS — Z6834 Body mass index (BMI) 34.0-34.9, adult: Secondary | ICD-10-CM | POA: Insufficient documentation

## 2013-01-04 DIAGNOSIS — Z79899 Other long term (current) drug therapy: Secondary | ICD-10-CM | POA: Insufficient documentation

## 2013-01-04 DIAGNOSIS — Q2111 Secundum atrial septal defect: Secondary | ICD-10-CM | POA: Insufficient documentation

## 2013-01-04 HISTORY — PX: TEE WITHOUT CARDIOVERSION: SHX5443

## 2013-01-04 LAB — GLUCOSE, CAPILLARY: Glucose-Capillary: 99 mg/dL (ref 70–99)

## 2013-01-04 SURGERY — ECHOCARDIOGRAM, TRANSESOPHAGEAL
Anesthesia: Moderate Sedation

## 2013-01-04 MED ORDER — LIDOCAINE VISCOUS 2 % MT SOLN
OROMUCOSAL | Status: AC
  Filled 2013-01-04: qty 15

## 2013-01-04 MED ORDER — SODIUM CHLORIDE 0.9 % IV SOLN
INTRAVENOUS | Status: DC
  Administered 2013-01-04: 09:00:00 via INTRAVENOUS

## 2013-01-04 MED ORDER — MIDAZOLAM HCL 5 MG/ML IJ SOLN
INTRAMUSCULAR | Status: AC
  Filled 2013-01-04: qty 2

## 2013-01-04 MED ORDER — FENTANYL CITRATE 0.05 MG/ML IJ SOLN
INTRAMUSCULAR | Status: DC | PRN
  Administered 2013-01-04 (×2): 25 ug via INTRAVENOUS
  Administered 2013-01-04: 12.5 ug via INTRAVENOUS
  Administered 2013-01-04: 25 ug via INTRAVENOUS

## 2013-01-04 MED ORDER — FENTANYL CITRATE 0.05 MG/ML IJ SOLN
INTRAMUSCULAR | Status: AC
  Filled 2013-01-04: qty 2

## 2013-01-04 MED ORDER — MIDAZOLAM HCL 10 MG/2ML IJ SOLN
INTRAMUSCULAR | Status: DC | PRN
  Administered 2013-01-04: 1 mg via INTRAVENOUS
  Administered 2013-01-04 (×2): 2 mg via INTRAVENOUS
  Administered 2013-01-04 (×2): 1 mg via INTRAVENOUS
  Administered 2013-01-04: 2 mg via INTRAVENOUS

## 2013-01-04 MED ORDER — LIDOCAINE VISCOUS 2 % MT SOLN
OROMUCOSAL | Status: DC | PRN
  Administered 2013-01-04: 15 mL via OROMUCOSAL

## 2013-01-04 NOTE — Op Note (Signed)
Full report to follow 

## 2013-01-04 NOTE — H&P (Signed)
Colin Ora, MD PCP   The patient presents today for routine electrophysiology followup.  He continues to have fatigue.  His edema is improved.  He has frequent tachypalpitations with documented heart rates at home 130s.  Today, he denies symptoms of chest pain, shortness of breath, orthopnea, PND, dizziness, presyncope, syncope, or neurologic sequela.   He has not been compliant with lifestyle modification including weight loss ro CPAP.      Past Medical History   Diagnosis  Date   .  Bipolar disorder         used to see psych, on no meds as of 03/2009   .  Rhinitis         vasomotor   .  Hypothyroidism     .  Hyperlipidemia     .  Gout     .  GERD (gastroesophageal reflux disease)     .  Diabetes mellitus     .  Persistent atrial fibrillation     .  Sleep apnea         mild, now treated with CPAP since ~9-12   .  Atrial flutter         typical appearing       Past Surgical History   Procedure  Laterality  Date   .  Tonsillectomy       .  Vasectomy       .  Nasal sinus surgery       .  Cardioversion    03/01/11         Current Outpatient Prescriptions   Medication  Sig  Dispense  Refill   .  allopurinol (ZYLOPRIM) 300 MG tablet  Take 1 tablet (300 mg total) by mouth daily.   90 tablet   1   .  diltiazem (CARDIZEM CD) 240 MG 24 hr capsule  Take one by mouth twice daily   60 capsule   3   .  dofetilide (TIKOSYN) 250 MCG capsule  Take 1 capsule (250 mcg total) by mouth 2 (two) times daily.   180 capsule   3   .  esomeprazole (NEXIUM) 40 MG capsule  Take 1 capsule (40 mg total) by mouth daily before breakfast.   90 capsule   1   .  fenofibrate 160 MG tablet  Take 1 tablet (160 mg total) by mouth daily.   90 tablet   1   .  furosemide (LASIX) 20 MG tablet  Take 1 tablet (20 mg total) by mouth daily.   90 tablet   1   .  glimepiride (AMARYL) 4 MG tablet  Take 1 tablet (4 mg total) by mouth daily before breakfast.   90 tablet   1   .  glucose blood (ONE TOUCH ULTRA TEST)  test strip  Test as directed 1-2 times a day   100 each   1   .  levothyroxine (SYNTHROID, LEVOTHROID) 200 MCG tablet  Take 1 tablet (200 mcg total) by mouth daily.   90 tablet   3   .  metFORMIN (GLUCOPHAGE) 1000 MG tablet  Take 1 tablet (1,000 mg total) by mouth 2 (two) times daily with a meal.   180 tablet   3   .  metoprolol succinate (TOPROL-XL) 50 MG 24 hr tablet  Take 1 tablet (50 mg total) by mouth daily.   90 tablet   3   .  ONETOUCH DELICA LANCETS  MISC  Test a As directed 1-2 times a day   100 each   1   .  Rivaroxaban (XARELTO) 20 MG TABS  Take 1 tablet (20 mg total) by mouth daily.   90 tablet   3       No current facility-administered medications for this visit.        No Known Allergies    History       Social History   .  Marital Status:  Married       Spouse Name:  N/A       Number of Children:  2   .  Years of Education:  N/A       Occupational History   .  works at [employer] History Main Topics   .  Smoking status:  Never Smoker    .  Smokeless tobacco:  Never Used   .  Alcohol Use:  No   .  Drug Use:  No   .  Sexually Active:  Not on file       Other Topics  Concern   .  Not on file       Social History Narrative     Pt lives in [place] with spouse and children 64 and 76 y/o.  Works in Consulting civil engineer at [employer].         Family History   Problem  Relation  Age of Onset   .  Diabetes  Father     .  Hypertension  Brother     .  Melanoma  Brother     .  Asthma  Daughter     .  Allergies  Daughter     .  Cancer  Maternal Uncle         throat   .  Throat cancer  Maternal Uncle     .  Colon cancer  Paternal Grandfather     .  Prostate cancer  Neg Hx        Physical Exam: Filed Vitals:     11/08/12 0936   BP:  122/83   Pulse:  131   Height:  6\' 2"  (1.88 m)   Weight:  278 lb (126.1 kg)        GEN- The patient is overweight appearing, alert and oriented x 3 today.    Head- normocephalic, atraumatic Eyes-  Sclera clear,  conjunctiva pink Ears- hearing intact Oropharynx- clear Neck- supple, no JVP Lymph- no cervical lymphadenopathy Lungs- Clear to ausculation bilaterally, normal work of breathing Heart- tachycardic irregular rhythm, no murmurs, rubs or gallops, PMI not laterally displaced GI- soft, NT, ND, + BS Extremities- no clubbing, cyanosis, trace BLE edema MS- no significant deformity or atrophy Skin- no rash or lesion Psych- euthymic mood, full affect Neuro- strength and sensation are intact     Impression:  Patinet is a 52 yo with atrial fibrillation . Plan for TEE with possible ablation.

## 2013-01-04 NOTE — Telephone Encounter (Signed)
Answered questions again, be at hospital at 5:30am.  Nothing to eat or drink after MN and no meds

## 2013-01-04 NOTE — Telephone Encounter (Signed)
New Prob  Pt wife would like to speak with you. She did not say what it was concerning.

## 2013-01-04 NOTE — Progress Notes (Signed)
Echocardiogram Transesophageal has been performed.  Colin Wood 01/04/2013, 12:03 PM

## 2013-01-05 ENCOUNTER — Ambulatory Visit (HOSPITAL_COMMUNITY)
Admission: RE | Admit: 2013-01-05 | Discharge: 2013-01-06 | Disposition: A | Discharge status: Home / Self Care | Payer: 59 | Source: Ambulatory Visit | Attending: Internal Medicine | Admitting: Internal Medicine

## 2013-01-05 ENCOUNTER — Encounter (HOSPITAL_COMMUNITY): Payer: Self-pay | Admitting: Anesthesiology

## 2013-01-05 ENCOUNTER — Encounter (HOSPITAL_COMMUNITY)
Admission: RE | Disposition: A | Discharge status: Home / Self Care | Payer: Self-pay | Source: Ambulatory Visit | Attending: Internal Medicine

## 2013-01-05 ENCOUNTER — Ambulatory Visit (HOSPITAL_COMMUNITY): Payer: 59 | Admitting: Anesthesiology

## 2013-01-05 DIAGNOSIS — R059 Cough, unspecified: Secondary | ICD-10-CM | POA: Insufficient documentation

## 2013-01-05 DIAGNOSIS — Z79899 Other long term (current) drug therapy: Secondary | ICD-10-CM | POA: Insufficient documentation

## 2013-01-05 DIAGNOSIS — R05 Cough: Secondary | ICD-10-CM | POA: Insufficient documentation

## 2013-01-05 DIAGNOSIS — G4733 Obstructive sleep apnea (adult) (pediatric): Secondary | ICD-10-CM | POA: Diagnosis present

## 2013-01-05 DIAGNOSIS — I4892 Unspecified atrial flutter: Secondary | ICD-10-CM | POA: Insufficient documentation

## 2013-01-05 DIAGNOSIS — I4891 Unspecified atrial fibrillation: Secondary | ICD-10-CM | POA: Diagnosis present

## 2013-01-05 DIAGNOSIS — R509 Fever, unspecified: Secondary | ICD-10-CM | POA: Insufficient documentation

## 2013-01-05 DIAGNOSIS — E119 Type 2 diabetes mellitus without complications: Secondary | ICD-10-CM | POA: Insufficient documentation

## 2013-01-05 DIAGNOSIS — E876 Hypokalemia: Secondary | ICD-10-CM | POA: Insufficient documentation

## 2013-01-05 HISTORY — PX: ABLATION OF DYSRHYTHMIC FOCUS: SHX254

## 2013-01-05 HISTORY — PX: ATRIAL FIBRILLATION ABLATION: SHX5456

## 2013-01-05 LAB — CBC
HCT: 40.2 % (ref 39.0–52.0)
MCH: 28.7 pg (ref 26.0–34.0)
MCV: 81.4 fL (ref 78.0–100.0)
RBC: 4.94 MIL/uL (ref 4.22–5.81)
RDW: 12.9 % (ref 11.5–15.5)
WBC: 6.8 10*3/uL (ref 4.0–10.5)

## 2013-01-05 LAB — BASIC METABOLIC PANEL
BUN: 16 mg/dL (ref 6–23)
CO2: 26 mEq/L (ref 19–32)
Calcium: 10 mg/dL (ref 8.4–10.5)
Chloride: 102 mEq/L (ref 96–112)
Creatinine, Ser: 0.95 mg/dL (ref 0.50–1.35)

## 2013-01-05 LAB — POCT ACTIVATED CLOTTING TIME
Activated Clotting Time: 232 seconds
Activated Clotting Time: 237 seconds

## 2013-01-05 LAB — URINALYSIS, ROUTINE W REFLEX MICROSCOPIC
Bilirubin Urine: NEGATIVE
Glucose, UA: NEGATIVE mg/dL
Hgb urine dipstick: NEGATIVE
Ketones, ur: NEGATIVE mg/dL
Leukocytes, UA: NEGATIVE
pH: 5.5 (ref 5.0–8.0)

## 2013-01-05 LAB — GLUCOSE, CAPILLARY

## 2013-01-05 SURGERY — ATRIAL FIBRILLATION ABLATION
Anesthesia: Monitor Anesthesia Care

## 2013-01-05 MED ORDER — FUROSEMIDE 20 MG PO TABS
20.0000 mg | ORAL_TABLET | Freq: Every day | ORAL | Status: DC
  Administered 2013-01-05 – 2013-01-06 (×2): 20 mg via ORAL
  Filled 2013-01-05 (×2): qty 1

## 2013-01-05 MED ORDER — HEPARIN SODIUM (PORCINE) 1000 UNIT/ML IJ SOLN
INTRAMUSCULAR | Status: AC
  Filled 2013-01-05: qty 1

## 2013-01-05 MED ORDER — LACTATED RINGERS IV SOLN
INTRAVENOUS | Status: DC | PRN
  Administered 2013-01-05 (×2): via INTRAVENOUS

## 2013-01-05 MED ORDER — PANTOPRAZOLE SODIUM 40 MG PO TBEC
40.0000 mg | DELAYED_RELEASE_TABLET | Freq: Every day | ORAL | Status: DC
  Administered 2013-01-05 – 2013-01-06 (×2): 40 mg via ORAL
  Filled 2013-01-05 (×2): qty 1

## 2013-01-05 MED ORDER — ALLOPURINOL 300 MG PO TABS
300.0000 mg | ORAL_TABLET | Freq: Every day | ORAL | Status: DC
  Administered 2013-01-05 – 2013-01-06 (×2): 300 mg via ORAL
  Filled 2013-01-05 (×2): qty 1

## 2013-01-05 MED ORDER — LEVOTHYROXINE SODIUM 200 MCG PO TABS
200.0000 ug | ORAL_TABLET | Freq: Every day | ORAL | Status: DC
  Administered 2013-01-05 – 2013-01-06 (×2): 200 ug via ORAL
  Filled 2013-01-05 (×3): qty 1

## 2013-01-05 MED ORDER — ONDANSETRON HCL 4 MG/2ML IJ SOLN
INTRAMUSCULAR | Status: DC | PRN
  Administered 2013-01-05: 4 mg via INTRAVENOUS

## 2013-01-05 MED ORDER — ONDANSETRON HCL 4 MG/2ML IJ SOLN: 4.0000 mg | Freq: Four times a day (QID) | INTRAMUSCULAR | Status: DC | PRN

## 2013-01-05 MED ORDER — SODIUM CHLORIDE 0.9 % IV SOLN: 250.0000 mL | INTRAVENOUS | Status: DC | PRN

## 2013-01-05 MED ORDER — HEPARIN SODIUM (PORCINE) 1000 UNIT/ML IJ SOLN
INTRAMUSCULAR | Status: DC | PRN
  Administered 2013-01-05: 12000 [IU] via INTRAVENOUS
  Administered 2013-01-05: 7000 [IU] via INTRAVENOUS
  Administered 2013-01-05: 5000 [IU] via INTRAVENOUS

## 2013-01-05 MED ORDER — SODIUM CHLORIDE 0.9 % IJ SOLN: 3.0000 mL | Freq: Two times a day (BID) | INTRAMUSCULAR | Status: DC

## 2013-01-05 MED ORDER — HYDROCORTISONE 1 % EX CREA
TOPICAL_CREAM | CUTANEOUS | Status: DC | PRN
  Administered 2013-01-06: 04:00:00 via TOPICAL
  Filled 2013-01-05: qty 28

## 2013-01-05 MED ORDER — SODIUM CHLORIDE 0.9 % IV SOLN: INTRAVENOUS | Status: DC

## 2013-01-05 MED ORDER — PROTAMINE SULFATE 10 MG/ML IV SOLN
INTRAVENOUS | Status: DC | PRN
  Administered 2013-01-05 (×2): 10 mg via INTRAVENOUS

## 2013-01-05 MED ORDER — DILTIAZEM HCL ER COATED BEADS 240 MG PO CP24
480.0000 mg | ORAL_CAPSULE | Freq: Every day | ORAL | Status: DC
Start: 2013-01-05 — End: 2013-01-05
  Filled 2013-01-05: qty 2

## 2013-01-05 MED ORDER — ACETAMINOPHEN 325 MG PO TABS
650.0000 mg | ORAL_TABLET | ORAL | Status: DC | PRN
  Administered 2013-01-05 – 2013-01-06 (×3): 650 mg via ORAL
  Filled 2013-01-05 (×3): qty 2

## 2013-01-05 MED ORDER — DOFETILIDE 250 MCG PO CAPS
250.0000 ug | ORAL_CAPSULE | Freq: Two times a day (BID) | ORAL | Status: DC
  Administered 2013-01-05 – 2013-01-06 (×2): 250 ug via ORAL
  Filled 2013-01-05 (×4): qty 1

## 2013-01-05 MED ORDER — RIVAROXABAN 20 MG PO TABS
20.0000 mg | ORAL_TABLET | Freq: Every day | ORAL | Status: DC
  Administered 2013-01-05: 20 mg via ORAL
  Filled 2013-01-05 (×2): qty 1

## 2013-01-05 MED ORDER — METOPROLOL SUCCINATE ER 100 MG PO TB24
100.0000 mg | ORAL_TABLET | Freq: Every day | ORAL | Status: DC
  Administered 2013-01-05: 100 mg via ORAL
  Filled 2013-01-05 (×2): qty 1

## 2013-01-05 MED ORDER — FENTANYL CITRATE 0.05 MG/ML IJ SOLN
INTRAMUSCULAR | Status: DC | PRN
  Administered 2013-01-05: 150 ug via INTRAVENOUS
  Administered 2013-01-05: 50 ug via INTRAVENOUS
  Administered 2013-01-05: 100 ug via INTRAVENOUS

## 2013-01-05 MED ORDER — HYDROCODONE-ACETAMINOPHEN 5-325 MG PO TABS: 1.0000 | ORAL_TABLET | ORAL | Status: DC | PRN

## 2013-01-05 MED ORDER — SODIUM CHLORIDE 0.9 % IJ SOLN: 3.0000 mL | INTRAMUSCULAR | Status: DC | PRN

## 2013-01-05 MED ORDER — DILTIAZEM HCL ER COATED BEADS 240 MG PO CP24
240.0000 mg | ORAL_CAPSULE | Freq: Two times a day (BID) | ORAL | Status: DC
  Administered 2013-01-05 – 2013-01-06 (×2): 240 mg via ORAL
  Filled 2013-01-05 (×3): qty 1

## 2013-01-05 MED ORDER — LIDOCAINE HCL (CARDIAC) 20 MG/ML IV SOLN
INTRAVENOUS | Status: DC | PRN
  Administered 2013-01-05: 60 mg via INTRAVENOUS

## 2013-01-05 MED ORDER — PROPOFOL 10 MG/ML IV BOLUS
INTRAVENOUS | Status: DC | PRN
  Administered 2013-01-05: 200 mg via INTRAVENOUS

## 2013-01-05 MED ORDER — GLIMEPIRIDE 4 MG PO TABS
4.0000 mg | ORAL_TABLET | Freq: Every day | ORAL | Status: DC
  Administered 2013-01-06: 4 mg via ORAL
  Filled 2013-01-05 (×2): qty 1

## 2013-01-05 MED ORDER — MIDAZOLAM HCL 5 MG/5ML IJ SOLN
INTRAMUSCULAR | Status: DC | PRN
  Administered 2013-01-05: 2 mg via INTRAVENOUS

## 2013-01-05 MED ORDER — ISOPROTERENOL HCL 0.2 MG/ML IJ SOLN
1000.0000 ug | INTRAVENOUS | Status: DC | PRN
  Administered 2013-01-05: 20 ug/min via INTRAVENOUS

## 2013-01-05 NOTE — Preoperative (Signed)
Beta Blockers   Reason not to administer Beta Blockers:metoprolol 01/04/13 2100

## 2013-01-05 NOTE — H&P (Signed)
Colin Ora, MD PCP  The patient presents today for routine electrophysiology followup. He continues to have fatigue. His edema is improved. He has frequent tachypalpitations with documented heart rates at home 130s. Today, he denies symptoms of chest pain, shortness of breath, orthopnea, PND, dizziness, presyncope, syncope, or neurologic sequela. He has not been compliant with lifestyle modification including weight loss ro CPAP.   Past Medical History   Diagnosis  Date   .  Bipolar disorder      used to see psych, on no meds as of 03/2009   .  Rhinitis      vasomotor   .  Hypothyroidism    .  Hyperlipidemia    .  Gout    .  GERD (gastroesophageal reflux disease)    .  Diabetes mellitus    .  Persistent atrial fibrillation    .  Sleep apnea      mild, now treated with CPAP since ~9-12   .  Atrial flutter      typical appearing    Past Surgical History   Procedure  Laterality  Date   .  Tonsillectomy     .  Vasectomy     .  Nasal sinus surgery     .  Cardioversion   03/01/11    Current Outpatient Prescriptions   Medication  Sig  Dispense  Refill   .  allopurinol (ZYLOPRIM) 300 MG tablet  Take 1 tablet (300 mg total) by mouth daily.  90 tablet  1   .  diltiazem (CARDIZEM CD) 240 MG 24 hr capsule  Take one by mouth twice daily  60 capsule  3   .  dofetilide (TIKOSYN) 250 MCG capsule  Take 1 capsule (250 mcg total) by mouth 2 (two) times daily.  180 capsule  3   .  esomeprazole (NEXIUM) 40 MG capsule  Take 1 capsule (40 mg total) by mouth daily before breakfast.  90 capsule  1   .  fenofibrate 160 MG tablet  Take 1 tablet (160 mg total) by mouth daily.  90 tablet  1   .  furosemide (LASIX) 20 MG tablet  Take 1 tablet (20 mg total) by mouth daily.  90 tablet  1   .  glimepiride (AMARYL) 4 MG tablet  Take 1 tablet (4 mg total) by mouth daily before breakfast.  90 tablet  1   .  glucose blood (ONE TOUCH ULTRA TEST) test strip  Test as directed 1-2 times a day  100 each  1   .   levothyroxine (SYNTHROID, LEVOTHROID) 200 MCG tablet  Take 1 tablet (200 mcg total) by mouth daily.  90 tablet  3   .  metFORMIN (GLUCOPHAGE) 1000 MG tablet  Take 1 tablet (1,000 mg total) by mouth 2 (two) times daily with a meal.  180 tablet  3   .  metoprolol succinate (TOPROL-XL) 50 MG 24 hr tablet  Take 1 tablet (50 mg total) by mouth daily.  90 tablet  3   .  ONETOUCH DELICA LANCETS MISC  Test a As directed 1-2 times a day  100 each  1   .  Rivaroxaban (XARELTO) 20 MG TABS  Take 1 tablet (20 mg total) by mouth daily.  90 tablet  3    No current facility-administered medications for this visit.   No Known Allergies  History    Social History   .  Marital Status:  Married  Spouse Name:  N/A     Number of Children:  2   .  Years of Education:  N/A    Occupational History   .  works at [employer] History Main Topics   .  Smoking status:  Never Smoker   .  Smokeless tobacco:  Never Used   .  Alcohol Use:  No   .  Drug Use:  No   .  Sexually Active:  Not on file    Other Topics  Concern   .  Not on file    Social History Narrative    Pt lives in [place] with spouse and children 15 and 74 y/o. Works in Consulting civil engineer at [employer].    Family History   Problem  Relation  Age of Onset   .  Diabetes  Father    .  Hypertension  Brother    .  Melanoma  Brother    .  Asthma  Daughter    .  Allergies  Daughter    .  Cancer  Maternal Uncle      throat   .  Throat cancer  Maternal Uncle    .  Colon cancer  Paternal Grandfather    .  Prostate cancer  Neg Hx    Physical Exam:  Filed Vitals:    11/08/12 0936   BP:  122/83   Pulse:  131   Height:  6\' 2"  (1.88 m)   Weight:  278 lb (126.1 kg)   GEN- The patient is overweight appearing, alert and oriented x 3 today.  Head- normocephalic, atraumatic  Eyes- Sclera clear, conjunctiva pink  Ears- hearing intact  Oropharynx- clear  Neck- supple, no JVP  Lymph- no cervical lymphadenopathy  Lungs- Clear to ausculation bilaterally,  normal work of breathing  Heart- tachycardic irregular rhythm, no murmurs, rubs or gallops, PMI not laterally displaced  GI- soft, NT, ND, + BS  Extremities- no clubbing, cyanosis, trace BLE edema    Assessment and Plan:  1. Afib/ atrial flutter  V rates remain poorly controlled.  Therapeutic strategies for afib and atrial flutter including medicine and ablation were discussed in detail with the patient today. Risk, benefits, and alternatives to EP study and radiofrequency ablation were also discussed in detail today. These risks include but are not limited to stroke, bleeding, vascular damage, tamponade, perforation, damage to the esophagus, lungs, and other structures, pulmonary vein stenosis, worsening renal function, and death. The patient understands these risk and wishes to proceed.  We will therefore proceed with catheter ablation at this time.

## 2013-01-05 NOTE — Anesthesia Postprocedure Evaluation (Signed)
  Anesthesia Post-op Note  Patient: Colin Wood  Procedure(s) Performed: Procedure(s): ATRIAL FIBRILLATION ABLATION (N/A)  Patient Location: PACU  Anesthesia Type:General  Level of Consciousness: awake  Airway and Oxygen Therapy: Patient Spontanous Breathing and Patient connected to nasal cannula oxygen  Post-op Pain: none  Post-op Assessment: Post-op Vital signs reviewed, Patient's Cardiovascular Status Stable, Respiratory Function Stable and Patent Airway  Post-op Vital Signs: Reviewed and stable  Complications: No apparent anesthesia complications

## 2013-01-05 NOTE — Transfer of Care (Signed)
Immediate Anesthesia Transfer of Care Note  Patient: Colin Wood  Procedure(s) Performed: Procedure(s): ATRIAL FIBRILLATION ABLATION (N/A)  Patient Location: Cath Lab  Anesthesia Type:General  Level of Consciousness: awake and patient cooperative  Airway & Oxygen Therapy: Patient Spontanous Breathing and Patient connected to nasal cannula oxygen  Post-op Assessment: Report given to PACU RN and Post -op Vital signs reviewed and stable  Post vital signs: Reviewed and stable  Complications: No apparent anesthesia complications

## 2013-01-05 NOTE — Anesthesia Preprocedure Evaluation (Addendum)
Anesthesia Evaluation  Patient identified by MRN, date of birth, ID band Patient awake    Reviewed: Allergy & Precautions, H&P , NPO status , Patient's Chart, lab work & pertinent test results, reviewed documented beta blocker date and time   History of Anesthesia Complications Negative for: history of anesthetic complications  Airway Mallampati: III TM Distance: >3 FB Neck ROM: Full    Dental  (+) Teeth Intact and Dental Advisory Given   Pulmonary sleep apnea and Continuous Positive Airway Pressure Ventilation ,  breath sounds clear to auscultation  Pulmonary exam normal       Cardiovascular hypertension, Pt. on medications and Pt. on home beta blockers + dysrhythmias Atrial Fibrillation Rhythm:Irregular Rate:Tachycardia  Echo 02/2012 Study Conclusions  - Left ventricle: The cavity size was normal. Wall thickness   was increased in a pattern of mild LVH. The estimated   ejection fraction was 55%. Indeterminant diastolic   function (atrial fibrillation). Wall motion was normal;   there were no regional wall motion abnormalities. - Aortic valve: There was no stenosis. - Mitral valve: Trivial regurgitation. - Left atrium: The atrium was mildly dilated. - Right ventricle: The cavity size was normal. Systolic   function was normal. - Tricuspid valve: Peak RV-RA gradient: 21mm Hg (S). - Pulmonary arteries: PA peak pressure: 31mm Hg (S). - Systemic veins: IVC was not visualized. Impressions:  - The patient was in rapid atrial fibrillation. Normal LV   size with mild LV hypertrophy. EF 55-60%. No significant   valvular abnormalities. Normal RV size and systolic   function.    Neuro/Psych PSYCHIATRIC DISORDERS Depression Bipolar Disorder negative neurological ROS     GI/Hepatic Neg liver ROS, GERD-  Medicated and Controlled,  Endo/Other  diabetes (glu 171), Well Controlled, Type 2, Oral Hypoglycemic AgentsHypothyroidism  Morbid obesity  Renal/GU negative Renal ROS     Musculoskeletal negative musculoskeletal ROS (+)   Abdominal (+) + obese,   Peds  Hematology  (+) Blood dyscrasia (xarelto ), ,   Anesthesia Other Findings   Reproductive/Obstetrics                         Anesthesia Physical Anesthesia Plan  ASA: III  Anesthesia Plan: General   Post-op Pain Management:    Induction: Intravenous  Airway Management Planned: LMA  Additional Equipment:   Intra-op Plan:   Post-operative Plan:   Informed Consent: I have reviewed the patients History and Physical, chart, labs and discussed the procedure including the risks, benefits and alternatives for the proposed anesthesia with the patient or authorized representative who has indicated his/her understanding and acceptance.   Dental advisory given  Plan Discussed with: CRNA and Surgeon  Anesthesia Plan Comments: (Plan routine monitors, GA- LMA OK)        Anesthesia Quick Evaluation

## 2013-01-05 NOTE — Anesthesia Procedure Notes (Signed)
Procedure Name: LMA Insertion Date/Time: 01/05/2013 7:49 AM Performed by: Leona Singleton A Pre-anesthesia Checklist: Patient identified, Emergency Drugs available, Suction available and Patient being monitored Patient Re-evaluated:Patient Re-evaluated prior to inductionOxygen Delivery Method: Circle system utilized Preoxygenation: Pre-oxygenation with 100% oxygen Intubation Type: IV induction LMA: LMA inserted LMA Size: 5.0 Tube type: Oral Number of attempts: 1 Placement Confirmation: positive ETCO2 and breath sounds checked- equal and bilateral Tube secured with: Tape Dental Injury: Teeth and Oropharynx as per pre-operative assessment

## 2013-01-05 NOTE — Op Note (Signed)
SURGEON:  Hillis Range, MD  PREPROCEDURE DIAGNOSES: 1. Persistent atrial fibrillation. 2. Paroxysmal atria flutter  POSTPROCEDURE DIAGNOSES: 1. Persistent atrial fibrillation. 2. Paroxysmal atria flutter  PROCEDURES: 1. Comprehensive electrophysiologic study. 2. Coronary sinus pacing and recording. 3. Three-dimensional mapping of atrial fibrillation with additional mapping and ablation of a second discrete focus (atrial flutter) 4. Ablation of atrial fibrillation with additional mapping and ablation of a second discrete focus (atrial flutter) 5. Intracardiac echocardiography. 6. Transseptal puncture of an intact septum. 7.  Rotational Angiography with processing at an independent workstation 8. Arrhythmia induction with pacing with isuprel infusion  INTRODUCTION:  Colin Wood is a 52 y.o. male with a history of persistent atrial fibrillation and atrial flutter who now presents for EP study and radiofrequency ablation.  The patient reports initially being diagnosed with atrial fibrillation and atrial flutter after presenting with symptomatic palpitations and fatgiue. The patient reports increasing frequency and duration of atrial fibrillation since that time.  The patient has failed medical therapy with Joice Lofts.  The patient therefore presents today for catheter ablation of atrial fibrillation.  DESCRIPTION OF PROCEDURE:  Informed written consent was obtained, and the patient was brought to the electrophysiology lab in a fasting state.  The patient was adequately sedated with intravenous medications as outlined in the anesthesia report.  The patient's left and right groins were prepped and draped in the usual sterile fashion by the EP lab staff.  Using a percutaneous Seldinger technique, two 7-French and one 11-French hemostasis sheaths were placed into the right common femoral vein.  3 Dimensional Rotational Angiography: A 5 french pigtail catheter was introduced through the right  common femoral vein and advanced into the inferior venocava.  3 demential rotational angiography was then performed by power injection of 100cc of nonionic contrast.  Reprocessing at an independent work station was then performed.   This demonstrated a moderate sized left atrium with 4 separate pulmonary veins which were also moderate in size.  There were no anomalous veins or significant abnormalities.  A 3 dimensional rendering of the left atrium was then merged using [employer] onto the WellPoint system and registered with intracardiac echo (see below).  The pigtail catheter was then removed.  Catheter Placement:  A 7-French Biosense Webster Decapolar coronary sinus catheter was introduced through the right common femoral vein and advanced into the coronary sinus for recording and pacing from this location.  A  quadripolar catheter was introduced through the right common femoral vein and advanced into the right ventricle for recording and pacing.  This catheter was then pulled back to the His bundle location.    Initial Measurements: The patient presented to the electrophysiology lab in sinus rhythm.  His PR interval measured 159 msec with a QRS duration of 139 msec and a QT interval of 465 msec.  The AH interval measured 118 msec and the HV interval measured 51 msec.     Intracardiac Echocardiography: A 10-French Biosense Webster AcuNav intracardiac echocardiography catheter was introduced through the left common femoral vein and advanced into the right atrium. Intracardiac echocardiography was performed of the left atrium, and a three-dimensional anatomical rendering of the left atrium was performed using CARTO sound technology.  The patient was noted to have a moderate sized left atrium.  The interatrial septum was prominent and aneurysmal with a PFO noted. All 4 pulmonary veins were visualized and noted to have separate ostia.  The pulmonary veins were moderate in size.  The left  atrial appendage  was visualized and did not reveal thrombus.   There was no evidence of pulmonary vein stenosis.   Transseptal Puncture: The middle right common femoral vein sheath was exchanged for an 8.5 [place] SL2 transseptal sheath and transseptal access was achieved in a standard fashion using a Brockenbrough needle under biplane fluoroscopy with intracardiac echocardiography confirmation of the transseptal puncture.  Once transseptal access had been achieved, heparin was administered intravenously and intra- arterially in order to maintain an ACT of greater than 300 seconds throughout the procedure.   3D Mapping and Ablation: The His bundle catheter was removed and in its place a 3.5 mm [employer] Thermocool ablation catheter was advanced into the right atrium.  The transseptal sheath was pulled back into the IVC over a guidewire.  The ablation catheter was advanced across the transseptal hole using the wire as a guide.  The transseptal sheath was then re-advanced over the guidewire into the left atrium.  A duodecapolar Biosense Webster circular mapping catheter was introduced through the transseptal sheath and positioned over the mouth of all 4 pulmonary veins.  Three-dimensional electroanatomical mapping was performed using CARTO technology.  This demonstrated electrical activity within all four pulmonary veins at baseline. The patient underwent successful sequential electrical isolation and anatomical encircling of all four pulmonary veins using radiofrequency current with a circular mapping catheter as a guide.   The ablation catheter was then pulled back into the right atrial and positioned along the cavo-tricuspid isthmus.  Mapping along the atrial side of the isthmus was performed.  This demonstrated a standard isthmus.  A series of radiofrequency applications were then delivered along the isthmus.  Complete bidirectional cavotricuspid isthmus block was achieved as confirmed by  differential atrial pacing from the low lateral right atrium.  A stimulus to earliest atrial activation across the isthmus measured 130 msec bi-directionally.  The patient was observe without return of conduction through the isthmus.  Measurements Following Ablation: Following ablation, Isuprel was infused up to 20 mcg/min with no inducible atrial fibrillation, atrial tachycardia, atrial flutter, or sustained PACs. In sinus rhythm with RR interval was 734 msec, with PR 159 msec, QRS 120 msec, and Qtc 441 msec.  Following ablation the AH interval measured 62 msec with an HV interval of 49 msec. Ventricular pacing was performed, which revealed midline decremental VA conduction with a VA Wenckebach cycle length of 470 msec.  Rapid atrial pacing was performed, which revealed an AV Wenckebach cycle length of 350 msec ( post isuprel).  Electroisolation was then again confirmed in all four pulmonary veins.  Pacing was performed along the ablation line which confirmed entrance and exit block.  The procedure was therefore considered completed.  All catheters were removed, and the sheaths were aspirated and flushed.  The patient was transferred to the recovery area for sheath removal per protocol.  A limited bedside transthoracic echocardiogram revealed no pericardial effusion.  There were no early apparent complications.  CONCLUSIONS: 1. Sinus rhythm upon presentation.   2. Rotational Angiography reveals a moderate sized left atrium with four separate pulmonary veins without evidence of pulmonary vein stenosis.  + PFO by ICE. 3. Successful electrical isolation and anatomical encircling of all four pulmonary veins with radiofrequency current.  Cavotricuspid isthmus ablation performed with isthmus block acheived 4. No inducible arrhythmias following ablation both on and off of Isuprel 5. No early apparent complications.   Kendre Sires,MD 11:07 AM 01/05/2013

## 2013-01-06 ENCOUNTER — Encounter (HOSPITAL_COMMUNITY): Payer: Self-pay | Admitting: *Deleted

## 2013-01-06 ENCOUNTER — Ambulatory Visit (HOSPITAL_COMMUNITY): Payer: 59

## 2013-01-06 DIAGNOSIS — I4892 Unspecified atrial flutter: Secondary | ICD-10-CM

## 2013-01-06 DIAGNOSIS — I4891 Unspecified atrial fibrillation: Secondary | ICD-10-CM

## 2013-01-06 LAB — BASIC METABOLIC PANEL
Chloride: 101 mEq/L (ref 96–112)
Creatinine, Ser: 1.16 mg/dL (ref 0.50–1.35)
GFR calc Af Amer: 81 mL/min — ABNORMAL LOW (ref >90)
Potassium: 3.4 mEq/L — ABNORMAL LOW (ref 3.5–5.1)
Sodium: 138 mEq/L (ref 135–145)

## 2013-01-06 LAB — CBC
HCT: 35.4 % — ABNORMAL LOW (ref 39.0–52.0)
Hemoglobin: 12.7 g/dL — ABNORMAL LOW (ref 13.0–17.0)
MCH: 29.8 pg (ref 26.0–34.0)
MCHC: 35.9 g/dL (ref 30.0–36.0)
MCV: 83.1 fL (ref 78.0–100.0)
RBC: 4.26 MIL/uL (ref 4.22–5.81)

## 2013-01-06 LAB — GLUCOSE, CAPILLARY

## 2013-01-06 MED ORDER — POTASSIUM CHLORIDE CRYS ER 20 MEQ PO TBCR
40.0000 meq | EXTENDED_RELEASE_TABLET | Freq: Once | ORAL | Status: AC
  Administered 2013-01-06: 09:00:00 40 meq via ORAL
  Filled 2013-01-06: qty 2

## 2013-01-06 MED ORDER — OFF THE BEAT BOOK
Freq: Once | Status: AC
  Administered 2013-01-06: 04:00:00
  Filled 2013-01-06: qty 1

## 2013-01-06 NOTE — Progress Notes (Signed)
Tiny linear area of red raised rash noted to rt lower leg. Pt states feels like poison oak, states doesn't really itch much.  Hydrocortisone cream ordered to use prn itching.

## 2013-01-06 NOTE — Progress Notes (Signed)
Pt has not rested well, has freq dry hacking cough.  Temp continues 100.7, face flushed, skin clammy, gown changed.  Rt groin level 0.  C/O sore throat, given Tylenol PO and italian ice snack.  O2 sat 88-90% at rest on RA, encouraged TCDB, up to 94%.  resp unlabored at rest but dyspnea with minimal exertion.

## 2013-01-06 NOTE — Progress Notes (Signed)
   SUBJECTIVE: The patient is doing well today.  He has had mild fever overnight with nonproductive cough.  At this time, he denies chest pain, shortness of breath, urinary symptoms or any new concerns.  [name] allopurinol  300 mg Oral Daily  . diltiazem  240 mg Oral BID  . dofetilide  250 mcg Oral Q12H  . furosemide  20 mg Oral Daily  . glimepiride  4 mg Oral QAC breakfast  . levothyroxine  200 mcg Oral QAC breakfast  . metoprolol succinate  100 mg Oral QHS  . pantoprazole  40 mg Oral Daily  . potassium chloride  40 mEq Oral Once  . Rivaroxaban  20 mg Oral Q supper      OBJECTIVE: Physical Exam: Filed Vitals:   01/06/13 0004 01/06/13 0407 01/06/13 0416 01/06/13 0708  BP: 131/66 129/58  107/74  Pulse: 92 94 97 94  Temp: 100.7 F (38.2 C) 100.7 F (38.2 C)  99.7 F (37.6 C)  TempSrc: Oral Oral  Oral  Resp: 20 20  18   Height:      Weight:    270 lb 8.1 oz (122.7 kg)  SpO2: 93% 89% 93% 94%    Intake/Output Summary (Last 24 hours) at 01/06/13 0745 Last data filed at 01/06/13 0724  Gross per 24 hour  Intake   2680 ml  Output   1475 ml  Net   1205 ml    Telemetry reveals sinus rhythm  GEN- The patient is well appearing, alert and oriented x 3 today.   Head- normocephalic, atraumatic Eyes-  Sclera clear, conjunctiva pink Ears- hearing intact Oropharynx- clear Neck- supple  Lungs- Clear to ausculation bilaterally, normal work of breathing Heart- Regular rate and rhythm, no murmurs, rubs or gallops, PMI not laterally displaced GI- soft, NT, ND, + BS Extremities- no clubbing, cyanosis, or edema, no hematoma/ bruits Skin- no rash or lesion Psych- euthymic mood, full affect Neuro- strength and sensation are intact  LABS: Basic Metabolic Panel:  Recent Labs  16/10/96 0634 01/06/13 0440  NA 141 138  K 3.7 3.4*  CL 102 101  CO2 26 23  GLUCOSE 171* 171*  BUN 16 14  CREATININE 0.95 1.16  CALCIUM 10.0 9.2   Liver Function Tests: No results found for this basename:  AST, ALT, ALKPHOS, BILITOT, PROT, ALBUMIN,  in the last 72 hours No results found for this basename: LIPASE, AMYLASE,  in the last 72 hours CBC:  Recent Labs  01/05/13 0634 01/06/13 0440  WBC 6.8 10.2  HGB 14.2 12.7*  HCT 40.2 35.4*  MCV 81.4 83.1  PLT 216 204    RADIOLOGY: CXR pending  ASSESSMENT AND PLAN:  Active Problems:   Atrial fibrillation   [name] (obstructive sleep apnea)   Atrial flutter   1. Afib/ atrial flutter Doing well s/p ablation Nonproductive cough and fever this am.  Will check CXR.  Lungs are clear. Resume home medicines.  2. Hypokalemia- will give PO K this am  Probable discharge this am if CXR is ok. Follow-up with me in 3 months  Hillis Range, MD 01/06/2013 7:45 AM

## 2013-01-06 NOTE — Discharge Summary (Signed)
Physician Discharge Summary      Patient ID: Colin Wood MRN: [id number] DOB/AGE: 12/17/1960 52 y.o.  Admit date: 01/05/2013 Discharge date: 01/06/2013  Primary Discharge Diagnosis  Atrial fibrillation and atrial flutter Secondary Discharge Diagnosis: hypokalemia, obstructive sleep apnea  Significant Diagnostic Studies: atrial fibrillation ablation  Hospital Course: The patient was admitted for elective ablation of atrial fibrillation and atrial flutter.  He underwent sequential electrical isolation of all four pulmonary veins with RF.  He also underwent cavotricuspid isthmus ablation.  He was observed overnight without complication.  He did have fever and nonproductive cough without acute findings on chest xray.  At time of discharge, the patient was alert, ambulatory, and otherwise without complaint.   Discharge Exam: Blood pressure 107/74, pulse 94, temperature 99.7 F (37.6 C), temperature source Oral, resp. rate 18, height 6\' 2"  (1.88 m), weight 270 lb 8.1 oz (122.7 kg), SpO2 94.00%.   Physical Exam: Filed Vitals:   01/06/13 0004 01/06/13 0407 01/06/13 0416 01/06/13 0708  BP: 131/66 129/58  107/74  Pulse: 92 94 97 94  Temp: 100.7 F (38.2 C) 100.7 F (38.2 C)  99.7 F (37.6 C)  TempSrc: Oral Oral  Oral  Resp: 20 20  18   Height:      Weight:    270 lb 8.1 oz (122.7 kg)  SpO2: 93% 89% 93% 94%    GEN- The patient is well appearing, alert and oriented x 3 today.   Head- normocephalic, atraumatic Eyes-  Sclera clear, conjunctiva pink Ears- hearing intact Oropharynx- clear Neck- supple, no JVP Lymph- no cervical lymphadenopathy Lungs- Clear to ausculation bilaterally, normal work of breathing Heart- Regular rate and rhythm, no murmurs, rubs or gallops, PMI not laterally displaced GI- soft, NT, ND, + BS Extremities- no clubbing, cyanosis, or edema, no hematoma/ bruit MS- no significant deformity or atrophy Skin- no rash or lesion Psych- euthymic mood, full  affect Neuro- strength and sensation are intact  Labs:   Lab Results  Component Value Date   WBC 10.2 01/06/2013   HGB 12.7* 01/06/2013   HCT 35.4* 01/06/2013   MCV 83.1 01/06/2013   PLT 204 01/06/2013    Recent Labs Lab 01/06/13 0440  NA 138  K 3.4*  CL 101  CO2 23  BUN 14  CREATININE 1.16  CALCIUM 9.2  GLUCOSE 171*   No results found for this basename: CKTOTAL, CKMB, CKMBINDEX, TROPONINI    Lab Results  Component Value Date   CHOL 284 06/26/2010   CHOL 223 04/01/2009   CHOL 232 10/18/2008   Lab Results  Component Value Date   HDL 34 06/26/2010   HDL 38 40/01/8118   HDL 42 1/47/8295   Lab Results  Component Value Date   LDLCALC 118 04/01/2009   LDLCALC 140 10/18/2008   Lab Results  Component Value Date   TRIG 936 06/26/2010   TRIG 333 04/01/2009   TRIG 251 10/18/2008   No results found for this basename: CHOLHDL   No results found for this basename: LDLDIRECT      Radiology: cxr reviewed EKG ekg reviewed  FOLLOW UP PLANS AND APPOINTMENTS  Future Appointments Provider Department Dept Phone   02/13/2013 8:15 AM Wanda Plump, MD [facility]  [place] [phone]   03/29/2013 1:45 PM Hillis Range, MD [facility] Main Office [place]) [phone]   08/03/2013 9:00 AM Barbaraann Share, MD [facility] [phone]       Medication List    ASK your doctor about these  medications       allopurinol 300 MG tablet  Commonly known as:  ZYLOPRIM  Take 300 mg by mouth daily.     cholecalciferol 1000 UNITS tablet  Commonly known as:  VITAMIN D  Take 1,000 Units by mouth daily.     diltiazem 240 MG 24 hr capsule  Commonly known as:  CARDIZEM CD  Take 240 mg by mouth 2 (two) times daily.     dofetilide 250 MCG capsule  Commonly known as:  TIKOSYN  Take 250 mcg by mouth 2 (two) times daily. Takes 250 mcg at 9:00 am, and 250 mcg at 9:00 pm.     esomeprazole 40 MG capsule  Commonly known as:  NEXIUM  Take 40 mg by mouth every morning.       fenofibrate 160 MG tablet  Take 160 mg by mouth daily.     fish oil-omega-3 fatty acids 1000 MG capsule  Take 2 g by mouth daily.     furosemide 20 MG tablet  Commonly known as:  LASIX  Take 20 mg by mouth daily.     glimepiride 4 MG tablet  Commonly known as:  AMARYL  Take 4 mg by mouth daily before breakfast.     levothyroxine 200 MCG tablet  Commonly known as:  SYNTHROID, LEVOTHROID  Take 200 mcg by mouth daily.     metFORMIN 1000 MG tablet  Commonly known as:  GLUCOPHAGE  Take 1,000 mg by mouth 2 (two) times daily with a meal.     metoprolol succinate 100 MG 24 hr tablet  Commonly known as:  TOPROL-XL  Take 100 mg by mouth at bedtime.     Rivaroxaban 20 MG Tabs tablet  Commonly known as:  XARELTO  Take 20 mg by mouth at bedtime.           Follow-up Information   Follow up with Hillis Range, MD On 03/29/2013. (1:45pm)    Contact information:   [address] [place] [address] [phone]       BRING ALL MEDICATIONS WITH YOU TO FOLLOW UP APPOINTMENTS  Time spent with patient to include physician time: 30 minutes Signed: Hillis Range, MD 01/06/2013, 8:31 AM

## 2013-01-07 ENCOUNTER — Telehealth: Payer: Self-pay | Admitting: Physician Assistant

## 2013-01-07 NOTE — Telephone Encounter (Signed)
Pt's wife called Sunday 8/10. Pt still with nonproductive cough, fever 101.3, no appetite and no energy. No CP, SOB, or any other acute symptoms. D/w Dr. Johney Frame - we gave option of either 1) go to urgent care today or 2) be seen in clinic by Dr. Johney Frame tomorrow, using supportive care for now at home (rest, Tylenol 1g q6hr prn, making sure to get enough fluids). The wife elected #2. Dr. Johney Frame recommended they arrive at 9am tomrrow and he will let his nurse know. Warning signs to return to ER provided to wife who verbalized understanding. Dayna Dunn PA-C

## 2013-01-08 ENCOUNTER — Ambulatory Visit (INDEPENDENT_AMBULATORY_CARE_PROVIDER_SITE_OTHER): Payer: 59 | Admitting: Internal Medicine

## 2013-01-08 VITALS — BP 118/80 | HR 94 | Temp 98.3°F | Ht 74.0 in | Wt 268.0 lb

## 2013-01-08 DIAGNOSIS — R05 Cough: Secondary | ICD-10-CM

## 2013-01-08 MED ORDER — GUAIFENESIN-CODEINE 100-10 MG/5ML PO SOLN: 10.0000 mL | Freq: Three times a day (TID) | ORAL | Status: DC | PRN

## 2013-01-08 NOTE — Patient Instructions (Signed)
Your physician recommends that you schedule a follow-up appointment Thursday

## 2013-01-08 NOTE — Progress Notes (Signed)
PCP:  Colin Ora, MD  The patient presents today for early follow up post afib ablatoin.  He has had a nonproductive cough and low grade fever since procedure on Friday.   He denies pain with swallowing, chest pain, or SOB.  Today, he denies symptoms of palpitations, orthopnea, PND, lower extremity edema, dizziness, presyncope, syncope, or neurologic sequela.  The patient feels that he is tolerating medications without difficulties and is otherwise without complaint today.   Past Medical History  Diagnosis Date  . Bipolar disorder     used to see psych, on no meds as of 03/2009  . Rhinitis     vasomotor  . Hypothyroidism   . Hyperlipidemia   . Gout   . GERD (gastroesophageal reflux disease)   . Persistent atrial fibrillation     PVI 12/2012  . Sleep apnea     mild, now treated with CPAP since ~9-12  . Atrial flutter     typical appearing; s/p ablation 12-2012  . Diabetes mellitus     TYPE 2   Past Surgical History  Procedure Laterality Date  . Tonsillectomy    . Vasectomy    . Nasal sinus surgery    . Cardioversion  03/01/11  . Tee without cardioversion N/A 01/04/2013    Procedure: TRANSESOPHAGEAL ECHOCARDIOGRAM (TEE);  Surgeon: Pricilla Riffle, MD;  Location: [facility] ENDOSCOPY;  Service: Cardiovascular;  Laterality: N/A;  . Ablation of dysrhythmic focus  01/05/2013    PVI and flutter ablation by Dr Johney Frame    Current Outpatient Prescriptions  Medication Sig Dispense Refill  . allopurinol (ZYLOPRIM) 300 MG tablet Take 300 mg by mouth daily.      . cholecalciferol (VITAMIN D) 1000 UNITS tablet Take 1,000 Units by mouth daily.      [name] diltiazem (CARDIZEM CD) 240 MG 24 hr capsule Take 240 mg by mouth 2 (two) times daily.      [name] dofetilide (TIKOSYN) 250 MCG capsule Take 250 mcg by mouth 2 (two) times daily. Takes 250 mcg at 9:00 am, and 250 mcg at 9:00 pm.      . esomeprazole (NEXIUM) 40 MG capsule Take 40 mg by mouth every morning.      . fenofibrate 160 MG tablet Take 160 mg by mouth  daily.      . furosemide (LASIX) 20 MG tablet Take 20 mg by mouth daily.      [name] glimepiride (AMARYL) 4 MG tablet Take 4 mg by mouth daily before breakfast.      . KRILL OIL PO Take 1 tablet by mouth 2 (two) times daily.      [name] levothyroxine (SYNTHROID, LEVOTHROID) 200 MCG tablet Take 200 mcg by mouth daily.      . metFORMIN (GLUCOPHAGE) 1000 MG tablet Take 1,000 mg by mouth 2 (two) times daily with a meal.      . metoprolol succinate (TOPROL-XL) 100 MG 24 hr tablet Take 100 mg by mouth at bedtime.      . Rivaroxaban (XARELTO) 20 MG TABS Take 20 mg by mouth at bedtime.       No current facility-administered medications for this visit.   Facility-Administered Medications Ordered in Other Visits  Medication Dose Route Frequency Provider Last Rate Last Dose  . 0.9 %  sodium chloride infusion   Intravenous Continuous Pricilla Riffle, MD        No Known Allergies  History   Social History  . Marital Status: Married    Spouse Name:  N/A    Number of Children: 2  . Years of Education: N/A   Occupational History  . works at [employer] History Main Topics  . Smoking status: Never Smoker   . Smokeless tobacco: Never Used  . Alcohol Use: No  . Drug Use: No  . Sexually Active: Not on file   Other Topics Concern  . Not on file   Social History Narrative   Pt lives in [place] with spouse and children 70 and 49 y/o.  Works in Consulting civil engineer at [employer].    Family History  Problem Relation Age of Onset  . Diabetes Father   . Hypertension Brother   . Melanoma Brother   . Asthma Daughter   . Allergies Daughter   . Cancer Maternal Uncle     throat  . Throat cancer Maternal Uncle   . Colon cancer Paternal Grandfather   . Prostate cancer Neg Hx    Physical Exam: Filed Vitals:   01/08/13 0904  BP: 118/80  Pulse: 94  Temp: 98.3 F (36.8 C)  Height: 6\' 2"  (1.88 m)  Weight: 268 lb (121.564 kg)    GEN- The patient is well appearing, alert and oriented x 3 today.   Head-  normocephalic, atraumatic Eyes-  Sclera clear, conjunctiva pink Ears- hearing intact Oropharynx- clear Neck- supple, no JVP Lymph- no cervical lymphadenopathy Lungs- Clear to ausculation bilaterally, normal work of breathing Heart- Regular rate and rhythm with frequent ectopy, no murmurs, rubs or gallops, PMI not laterally displaced GI- soft, NT, ND, + BS Extremities- no clubbing, cyanosis, or edema  Assessment and Plan:  1. Cough/ SOB Afebrile here but with a frequent cough I suspect reflux and aspiration with recent procedure.  Lungs are clear and CXR prior to discharge was without acute process. Will treat with robitussin/ codeine and follow closely. Will not start antibiotics presently but will follow closely.  Return in 3 days, sooner if concerns

## 2013-01-11 ENCOUNTER — Encounter: Payer: Self-pay | Admitting: Internal Medicine

## 2013-01-11 ENCOUNTER — Ambulatory Visit (INDEPENDENT_AMBULATORY_CARE_PROVIDER_SITE_OTHER): Payer: 59 | Admitting: Internal Medicine

## 2013-01-11 VITALS — BP 122/83 | HR 80 | Temp 97.5°F | Ht 74.0 in | Wt 264.0 lb

## 2013-01-11 DIAGNOSIS — I4892 Unspecified atrial flutter: Secondary | ICD-10-CM

## 2013-01-11 DIAGNOSIS — I4891 Unspecified atrial fibrillation: Secondary | ICD-10-CM

## 2013-01-11 DIAGNOSIS — R05 Cough: Secondary | ICD-10-CM

## 2013-01-11 NOTE — Progress Notes (Signed)
PCP:  Colin Ora, MD  The patient presents today for early follow up post afib ablatoin. His cough is much better and fevers are resolved.  He denies pain with swallowing, chest pain, or SOB.  Today, he denies symptoms of palpitations, orthopnea, PND, lower extremity edema, dizziness, presyncope, syncope, or neurologic sequela.  The patient feels that he is tolerating medications without difficulties and is otherwise without complaint today.   Past Medical History  Diagnosis Date  . Bipolar disorder     used to see psych, on no meds as of 03/2009  . Rhinitis     vasomotor  . Hypothyroidism   . Hyperlipidemia   . Gout   . GERD (gastroesophageal reflux disease)   . Persistent atrial fibrillation     PVI 12/2012  . Sleep apnea     mild, now treated with CPAP since ~9-12  . Atrial flutter     typical appearing; s/p ablation 12-2012  . Diabetes mellitus     TYPE 2   Past Surgical History  Procedure Laterality Date  . Tonsillectomy    . Vasectomy    . Nasal sinus surgery    . Cardioversion  03/01/11  . Tee without cardioversion N/A 01/04/2013    Procedure: TRANSESOPHAGEAL ECHOCARDIOGRAM (TEE);  Surgeon: Pricilla Riffle, MD;  Location: [facility] ENDOSCOPY;  Service: Cardiovascular;  Laterality: N/A;  . Ablation of dysrhythmic focus  01/05/2013    PVI and flutter ablation by Dr Johney Frame    Current Outpatient Prescriptions  Medication Sig Dispense Refill  . allopurinol (ZYLOPRIM) 300 MG tablet Take 300 mg by mouth daily.      . cholecalciferol (VITAMIN D) 1000 UNITS tablet Take 1,000 Units by mouth daily.      [name] diltiazem (CARDIZEM CD) 240 MG 24 hr capsule Take 240 mg by mouth 2 (two) times daily.      [name] dofetilide (TIKOSYN) 250 MCG capsule Take 250 mcg by mouth 2 (two) times daily. Takes 250 mcg at 9:00 am, and 250 mcg at 9:00 pm.      . esomeprazole (NEXIUM) 40 MG capsule Take 40 mg by mouth every morning.      . fenofibrate 160 MG tablet Take 160 mg by mouth daily.      [name] glimepiride (AMARYL) 4  MG tablet Take 4 mg by mouth daily before breakfast.      . guaiFENesin-codeine 100-10 MG/5ML syrup Take 10 mLs by mouth 3 (three) times daily as needed for cough.  120 mL  0  . KRILL OIL PO Take 1 tablet by mouth 2 (two) times daily.      [name] levothyroxine (SYNTHROID, LEVOTHROID) 200 MCG tablet Take 200 mcg by mouth daily.      . metFORMIN (GLUCOPHAGE) 1000 MG tablet Take 1,000 mg by mouth 2 (two) times daily with a meal.      . metoprolol succinate (TOPROL-XL) 100 MG 24 hr tablet Take 100 mg by mouth at bedtime.      . Rivaroxaban (XARELTO) 20 MG TABS Take 20 mg by mouth at bedtime.      . furosemide (LASIX) 20 MG tablet Take 20 mg by mouth daily.       No current facility-administered medications for this visit.   Facility-Administered Medications Ordered in Other Visits  Medication Dose Route Frequency Provider Last Rate Last Dose  . 0.9 %  sodium chloride infusion   Intravenous Continuous Pricilla Riffle, MD        No Known Allergies  History   Social History  . Marital Status: Married    Spouse Name: N/A    Number of Children: 2  . Years of Education: N/A   Occupational History  . works at [employer] History Main Topics  . Smoking status: Never Smoker   . Smokeless tobacco: Never Used  . Alcohol Use: No  . Drug Use: No  . Sexual Activity: Not on file   Other Topics Concern  . Not on file   Social History Narrative   Pt lives in [place] with spouse and children 53 and 93 y/o.  Works in Consulting civil engineer at [employer].    Family History  Problem Relation Age of Onset  . Diabetes Father   . Hypertension Brother   . Melanoma Brother   . Asthma Daughter   . Allergies Daughter   . Cancer Maternal Uncle     throat  . Throat cancer Maternal Uncle   . Colon cancer Paternal Grandfather   . Prostate cancer Neg Hx    Physical Exam: Filed Vitals:   01/11/13 0834  BP: 122/83  Pulse: 80  Temp: 97.5 F (36.4 C)  TempSrc: Oral  Height: 6\' 2"  (1.88 m)  Weight: 264 lb (119.75  kg)    GEN- The patient is well appearing, alert and oriented x 3 today.   Head- normocephalic, atraumatic Eyes-  Sclera clear, conjunctiva pink Ears- hearing intact Oropharynx- clear Neck- supple, no JVP Lymph- no cervical lymphadenopathy Lungs- Clear to ausculation bilaterally, normal work of breathing Heart- Regular rate and rhythm with frequent ectopy, no murmurs, rubs or gallops, PMI not laterally displaced GI- soft, NT, ND, + BS Extremities- no clubbing, cyanosis, or edema, mild ecchymosis, no hematoma/bruit  Assessment and Plan:  1. Cough/ SOB Much improved I suspect reflux and aspiration with recent procedure.  Lungs are clear and CXR prior to discharge was without acute process. Will treat with robitussin/ codeine and follow closely. Will not start antibiotics presently but will follow closely.  Return in 7-10 days, sooner if concerns

## 2013-01-11 NOTE — Patient Instructions (Addendum)
Your physician recommends that you schedule a follow-up appointment in: 01/22/13 at 8:45

## 2013-01-15 ENCOUNTER — Telehealth: Payer: Self-pay | Admitting: Gastroenterology

## 2013-01-15 ENCOUNTER — Telehealth: Payer: Self-pay | Admitting: Internal Medicine

## 2013-01-15 NOTE — Telephone Encounter (Signed)
Pt was to be referred to a GI dr for acid reflux, was to see allred 8-25 because he is on vacation, and thinks allred was going to refer him after seeing him, but he feels he needs to be seen this week, can't get in without a referral call

## 2013-01-15 NOTE — Telephone Encounter (Signed)
Pt states that he has a terrible cough and he is requesting to be seen. States his doctor would like for him to be evaluated to see if the cough is gi related possibly due to reflux. Pt scheduled to see Doug Sou PA tomorrow at 8:30am. Pt aware of appt.

## 2013-01-15 NOTE — Telephone Encounter (Signed)
Spoke to patient and informed him that Dr. Johney Frame is out of the office all week on vacation. Advised patient to check with his PCP or insurance company for further directives regarding referral needs for this week. Patient states he will call [facility] and try to get in this week.

## 2013-01-16 ENCOUNTER — Encounter: Payer: Self-pay | Admitting: Gastroenterology

## 2013-01-16 ENCOUNTER — Other Ambulatory Visit: Payer: Self-pay | Admitting: *Deleted

## 2013-01-16 ENCOUNTER — Ambulatory Visit (INDEPENDENT_AMBULATORY_CARE_PROVIDER_SITE_OTHER): Payer: 59 | Admitting: Gastroenterology

## 2013-01-16 VITALS — BP 160/80 | HR 98 | Ht 74.0 in | Wt 265.0 lb

## 2013-01-16 DIAGNOSIS — R05 Cough: Secondary | ICD-10-CM

## 2013-01-16 DIAGNOSIS — K219 Gastro-esophageal reflux disease without esophagitis: Secondary | ICD-10-CM

## 2013-01-16 MED ORDER — DEXLANSOPRAZOLE 60 MG PO CPDR: 60.0000 mg | DELAYED_RELEASE_CAPSULE | Freq: Every day | ORAL | Status: DC

## 2013-01-16 MED ORDER — GUAIFENESIN-CODEINE 100-10 MG/5ML PO SOLN: ORAL | Status: DC

## 2013-01-16 MED ORDER — BENZONATATE 200 MG PO CAPS: ORAL_CAPSULE | ORAL | Status: DC

## 2013-01-16 NOTE — Progress Notes (Signed)
01/16/2013 Colin Wood [id number] [birth year]   HISTORY OF PRESENT ILLNESS:  This is a 52 year old male who is known to Dr. Arlyce Dice for screening colonoscopy only.  He presents to our office today with concern of ongoing cough that he thinks is due to acid reflux.  He says that he is coughing constantly and he cannot do anything because of this.  He reports that this occurred in the past and he saw an ENT MD who performed a laryngoscopy, which showed irritation in his throat and was told that it was very likely due to acid reflux.  He has been taking Nexium 40 mg daily for quite some time, but increased it to BID about one week ago.  He describes feeling acid into his throat as well along with some hoarseness of his voice at times.  Had a cardiac ablation for atrial fib/flutter on 8/8 by Dr. Johney Frame; he is also on Xarelto.     Past Medical History  Diagnosis Date  . Bipolar disorder     used to see psych, on no meds as of 03/2009  . Rhinitis     vasomotor  . Hypothyroidism   . Hyperlipidemia   . Gout   . GERD (gastroesophageal reflux disease)   . Persistent atrial fibrillation     PVI 12/2012  . Sleep apnea     mild, now treated with CPAP since ~9-12  . Atrial flutter     typical appearing; s/p ablation 12-2012  . Diabetes mellitus     TYPE 2   Past Surgical History  Procedure Laterality Date  . Tonsillectomy    . Vasectomy    . Nasal sinus surgery    . Cardioversion  03/01/11  . Tee without cardioversion N/A 01/04/2013    Procedure: TRANSESOPHAGEAL ECHOCARDIOGRAM (TEE);  Surgeon: Pricilla Riffle, MD;  Location: [facility] ENDOSCOPY;  Service: Cardiovascular;  Laterality: N/A;  . Ablation of dysrhythmic focus  01/05/2013    PVI and flutter ablation by Dr Johney Frame    reports that he has never smoked. He has never used smokeless tobacco. He reports that he does not drink alcohol or use illicit drugs. family history includes Allergies in his daughter; Asthma in his daughter; Cancer in his  maternal uncle; Colon cancer in his paternal grandfather; Diabetes in his father; Hypertension in his brother; Melanoma in his brother; Throat cancer in his maternal uncle. There is no history of Prostate cancer. No Known Allergies    Outpatient Encounter Prescriptions as of 01/16/2013  Medication Sig Dispense Refill  . allopurinol (ZYLOPRIM) 300 MG tablet Take 300 mg by mouth daily.      . cholecalciferol (VITAMIN D) 1000 UNITS tablet Take 1,000 Units by mouth daily.      [name] diltiazem (CARDIZEM CD) 240 MG 24 hr capsule Take 240 mg by mouth 2 (two) times daily.      [name] dofetilide (TIKOSYN) 250 MCG capsule Take 250 mcg by mouth 2 (two) times daily. Takes 250 mcg at 9:00 am, and 250 mcg at 9:00 pm.      . esomeprazole (NEXIUM) 40 MG capsule Take 40 mg by mouth every morning.      . fenofibrate 160 MG tablet Take 160 mg by mouth daily.      [name] glimepiride (AMARYL) 4 MG tablet Take 4 mg by mouth daily before breakfast.      . guaiFENesin-codeine 100-10 MG/5ML syrup Take 10 mLs by mouth 3 (three) times daily as needed for cough.  120 mL  0  . KRILL OIL PO Take 1 tablet by mouth 2 (two) times daily.      [name] levothyroxine (SYNTHROID, LEVOTHROID) 200 MCG tablet Take 200 mcg by mouth daily.      . metFORMIN (GLUCOPHAGE) 1000 MG tablet Take 1,000 mg by mouth 2 (two) times daily with a meal.      . metoprolol succinate (TOPROL-XL) 100 MG 24 hr tablet Take 100 mg by mouth at bedtime.      . Rivaroxaban (XARELTO) 20 MG TABS Take 20 mg by mouth at bedtime.      . [DISCONTINUED] furosemide (LASIX) 20 MG tablet Take 20 mg by mouth daily.       Facility-Administered Encounter Medications as of 01/16/2013  Medication Dose Route Frequency Provider Last Rate Last Dose  . 0.9 %  sodium chloride infusion   Intravenous Continuous Pricilla Riffle, MD         REVIEW OF SYSTEMS  : All other systems reviewed and negative except where noted in the History of Present Illness.   PHYSICAL EXAM: BP 160/80  Pulse 98  Ht 6'  2" (1.88 m)  Wt 265 lb (120.203 kg)  BMI 34.01 kg/m2  SpO2 98% General: Well developed white male in no acute distress; but coughing continuously. Head: Normocephalic and atraumatic; sweaty. Eyes:  sclerae anicteric, conjunctive pink. Ears: Normal auditory acuity Lungs: Clear throughout to auscultation Heart: Regular rate and rhythm Abdomen: Soft, nontender, non-distended. No masses or hepatomegaly noted. Normal bowel sounds. Musculoskeletal: Symmetrical with no gross deformities  Skin: No lesions on visible extremities Extremities: No edema  Neurological: Alert oriented x 4, grossly nonfocal Psychological:  Alert and cooperative. Normal mood and affect  ASSESSMENT AND PLAN: -Cough:  ? Secondary to acid reflux. -GERD:  Long-standing issues.  Currently on Nexium 40 mg BID with no resolution/improvement of symptoms, including cough.  *Will schedule for EGD.  He is on Xarelto.  The risks, benefits, and alternatives were discussed with the patient and he consents to proceed.  The risks benefits and alternatives to a temporary hold of anti-coagulants/anti-platelets for the procedure were discussed with the patient he consents to proceed. Obtain clearance from Dr. Johney Frame.  In the interim we will change his PPI to Dexilant 60 mg (twice a day for one week then once daily); prescription and samples given.  Will give reflux diet/lifestyle modifications to follow strictly.  Will refill cough syrup with codeine to use at bedtime and give him Tessalon pearls to use during the day.

## 2013-01-16 NOTE — Patient Instructions (Addendum)
We have given you samples of Dexilant 60 mg.  Take 1 tab in the Am and before dinner daily. We did sent a prescription also and have given you a saviings card to give the pharmacist. We sent prescriptions for Dexilant, Tessalon tablets, and guaifenesin with  Codeine.  We will call you once we hear from Dr. Johney Frame regarding the Nicki Reaper.   You have been scheduled for an endoscopy with propofol. Please follow written instructions given to you at your visit today. If you use inhalers (even only as needed), please bring them with you on the day of your procedure. Your physician has requested that you go to www.startemmi.com and enter the access code given to you at your visit today. This web site gives a general overview about your procedure. However, you should still follow specific instructions given to you by our office regarding your preparation for the procedure.

## 2013-01-18 ENCOUNTER — Telehealth: Payer: Self-pay | Admitting: Physician Assistant

## 2013-01-18 NOTE — Telephone Encounter (Signed)
Patient called the answering service regarding low HRs. He has a h/o atrial fibrillation s/p RFA on 8/8 and GERD. He has been experiencing a persistent, nonproductive cough post-procedure. He was mildly febrile 101.3 on 8/10 which has improved. He reports ongoing malaise and fatigue. He was evaluated formally by Dr. Johney Frame 8/14 who attributed the cough to GERD and aspiration during the procedure. He was afebrile at that time. He followed up with GI 8/19- PPI was increased to Dexilant, codeine antitussive and tessalon pearls were added. There has been mild improvement. His HR fluctuates from 110s to 40/50s. He denies lightheadedness, presyncope or syncope. Suspect fluctuations in HR are due to myocardial irritability and ongoing recovery s/p RFA, frequent cough with vagal stimulation and continued AVN blocker use- Cardizem 240mg  BID, Toprol-XL 100mg  qhs. Advised to hold AVN blockers if HR < 60. Should he maintain NSR further into recovery, these will likely be weaned. He understood and agreed. Discussing with Dr. Patty Sermons, low suspicion for post-op complication.  Jacqulyn Bath, PA-C 01/18/2013 6:06 PM

## 2013-01-22 ENCOUNTER — Other Ambulatory Visit: Payer: Self-pay | Admitting: *Deleted

## 2013-01-22 ENCOUNTER — Ambulatory Visit (INDEPENDENT_AMBULATORY_CARE_PROVIDER_SITE_OTHER): Payer: 59 | Admitting: Internal Medicine

## 2013-01-22 ENCOUNTER — Encounter: Payer: Self-pay | Admitting: Internal Medicine

## 2013-01-22 VITALS — BP 113/72 | HR 70 | Ht 74.0 in | Wt 264.0 lb

## 2013-01-22 DIAGNOSIS — I4892 Unspecified atrial flutter: Secondary | ICD-10-CM

## 2013-01-22 DIAGNOSIS — I4891 Unspecified atrial fibrillation: Secondary | ICD-10-CM

## 2013-01-22 MED ORDER — DILTIAZEM HCL ER COATED BEADS 240 MG PO CP24: 240.0000 mg | ORAL_CAPSULE | Freq: Every day | ORAL | Status: DC

## 2013-01-22 NOTE — Progress Notes (Signed)
PCP:  Willow Ora, MD  The patient presents today for early follow up post afib ablatoin. His cough is much better and fevers are resolved.  He denies pain with swallowing, chest pain, or SOB. He has had some bradycardia for which he has decreased his diltiazem.  He has been referred to GI for reflux. Today, he denies symptoms of palpitations, orthopnea, PND, lower extremity edema, dizziness, presyncope, syncope, or neurologic sequela.  The patient feels that he is tolerating medications without difficulties and is otherwise without complaint today.   Past Medical History  Diagnosis Date  . Bipolar disorder     used to see psych, on no meds as of 03/2009  . Rhinitis     vasomotor  . Hypothyroidism   . Hyperlipidemia   . Gout   . GERD (gastroesophageal reflux disease)   . Persistent atrial fibrillation     PVI 12/2012  . Sleep apnea     mild, now treated with CPAP since ~9-12  . Atrial flutter     typical appearing; s/p ablation 12-2012  . Diabetes mellitus     TYPE 2   Past Surgical History  Procedure Laterality Date  . Tonsillectomy    . Vasectomy    . Nasal sinus surgery    . Cardioversion  03/01/11  . Tee without cardioversion N/A 01/04/2013    Procedure: TRANSESOPHAGEAL ECHOCARDIOGRAM (TEE);  Surgeon: Pricilla Riffle, MD;  Location: [facility] ENDOSCOPY;  Service: Cardiovascular;  Laterality: N/A;  . Ablation of dysrhythmic focus  01/05/2013    PVI and flutter ablation by Dr Johney Frame    Current Outpatient Prescriptions  Medication Sig Dispense Refill  . allopurinol (ZYLOPRIM) 300 MG tablet Take 300 mg by mouth daily.      . benzonatate (TESSALON) 200 MG capsule Take 1 tab 3 times daily as needed for cough.  60 capsule  0  . cholecalciferol (VITAMIN D) 1000 UNITS tablet Take 1,000 Units by mouth daily.      [name] dexlansoprazole (DEXILANT) 60 MG capsule Take 60 mg by mouth 2 (two) times daily.      [name] diltiazem (CARDIZEM CD) 240 MG 24 hr capsule Take 1 capsule (240 mg total) by mouth daily.  30  capsule  11  . dofetilide (TIKOSYN) 250 MCG capsule Take 250 mcg by mouth 2 (two) times daily. Takes 250 mcg at 9:00 am, and 250 mcg at 9:00 pm.      . fenofibrate 160 MG tablet Take 160 mg by mouth daily.      [name] glimepiride (AMARYL) 4 MG tablet Take 4 mg by mouth daily before breakfast.      . guaiFENesin-codeine 100-10 MG/5ML syrup Take 10 ml at bedtime.  180 mL  0  . KRILL OIL PO Take 1 tablet by mouth 2 (two) times daily.      [name] levothyroxine (SYNTHROID, LEVOTHROID) 200 MCG tablet Take 200 mcg by mouth daily.      . metFORMIN (GLUCOPHAGE) 1000 MG tablet Take 1,000 mg by mouth 2 (two) times daily with a meal.      . metoprolol succinate (TOPROL-XL) 100 MG 24 hr tablet Take 100 mg by mouth at bedtime.      . Rivaroxaban (XARELTO) 20 MG TABS Take 20 mg by mouth at bedtime.       No current facility-administered medications for this visit.   Facility-Administered Medications Ordered in Other Visits  Medication Dose Route Frequency Provider Last Rate Last Dose  . 0.9 %  sodium chloride  infusion   Intravenous Continuous Pricilla Riffle, MD        No Known Allergies  History   Social History  . Marital Status: Married    Spouse Name: N/A    Number of Children: 2  . Years of Education: N/A   Occupational History  . works at [employer] History Main Topics  . Smoking status: Never Smoker   . Smokeless tobacco: Never Used  . Alcohol Use: No  . Drug Use: No  . Sexual Activity: Not on file   Other Topics Concern  . Not on file   Social History Narrative   Pt lives in [place] with spouse and children 70 and 43 y/o.  Works in Consulting civil engineer at [employer].    Family History  Problem Relation Age of Onset  . Diabetes Father   . Hypertension Brother   . Melanoma Brother   . Asthma Daughter   . Allergies Daughter   . Cancer Maternal Uncle     throat  . Throat cancer Maternal Uncle   . Colon cancer Paternal Grandfather   . Prostate cancer Neg Hx    Physical Exam: Filed Vitals:    01/22/13 0847  BP: 113/72  Pulse: 70  Height: 6\' 2"  (1.88 m)  Weight: 264 lb (119.75 kg)    GEN- The patient is well appearing, alert and oriented x 3 today.   Head- normocephalic, atraumatic Eyes-  Sclera clear, conjunctiva pink Ears- hearing intact Oropharynx- clear Neck- supple, no JVP Lymph- no cervical lymphadenopathy Lungs- Clear to ausculation bilaterally, normal work of breathing Heart- Regular rate and rhythm with frequent ectopy, no murmurs, rubs or gallops, PMI not laterally displaced GI- soft, NT, ND, + BS Extremities- no clubbing, cyanosis, or edema, mild ecchymosis, no hematoma/bruit  Assessment and Plan:  1. Cough/ SOB Much improved Follow-up with Dr Drue Novel  2. afib Stable Decrease cardizem to 240mg  daily Consider decreasing toprol to 50mg  daily if heart rates remain slow   Return to see me in 3 weeks

## 2013-01-22 NOTE — Patient Instructions (Addendum)
Your physician recommends that you schedule a follow-up appointment in: 3 weeks with Dr Johney Frame   Your physician has recommended you make the following change in your medication:  1) Decrease Cardizem to 240mg  daily  2) If Heart rate remains less than 50 decrease Toprol to 50mg  daily---Call me if you decrease  [phone](Kelly)

## 2013-01-24 ENCOUNTER — Telehealth: Payer: Self-pay | Admitting: *Deleted

## 2013-01-24 NOTE — Telephone Encounter (Signed)
Message copied by Derry Skill on Wed Jan 24, 2013  4:22 PM ------      Message from: Doug Sou D      Created: Wed Jan 24, 2013  2:20 PM      Regarding: RE: Xarelto clearance for colonoscopy       Dr. Arlyce Dice is ok with doing procedure while on xarelto.            Thank you,            Jess            ----- Message -----         From: Derry Skill, CMA         Sent: 01/24/2013  10:36 AM           To: Princella Pellegrini. Zehr, PA-C      Subject: FW: Xarelto clearance for colonoscopy                      Wanted to inform you of this response.              Shannan Slinker      ----- Message -----         From: Hillis Range, MD         Sent: 01/23/2013  11:00 PM           To: Derry Skill, CMA      Subject: RE: Xarelto clearance for colonoscopy                    [name] recently underwent left atrial ablation for afib.  He therefore cannot have interruption of his xarelto for 90 days.      His endoscopy will therefore need to be performed on uninterrupted xarelto.                  ----- Message -----         From: Derry Skill, CMA         Sent: 01/22/2013  11:25 AM           To: Hillis Range, MD      Subject: Xarelto clearance for colonoscopy                        01/22/2013                        RE: Colin Wood      DOB: [birth year]      MRN: [id number]                  Dear Dr. Hillis Range,                   We have scheduled the above patient for an endoscopic procedure. Our records show that he is on anticoagulation therapy.             Please advise as to how long the patient may come off his therapy of Xarelto prior to the procedure, which is scheduled for 01-31-2013.            Please fax back/ or route the completed form to [facility] CMA at [phone].             Sincerely,  Doug Sou PA-C                                ------

## 2013-01-24 NOTE — Telephone Encounter (Signed)
I called and left a message with the patient that he is to stay on the Xarelto for the procedure scheduled for 01-31-2013.  I asked the pt to call and let me know he got my message.

## 2013-01-25 NOTE — Progress Notes (Signed)
OK to do EGD while continuing xarelto. Reviewed and agree with management. Barbette Hair. Arlyce Dice, M.D., [facility]

## 2013-01-31 ENCOUNTER — Encounter: Payer: Self-pay | Admitting: Gastroenterology

## 2013-01-31 ENCOUNTER — Ambulatory Visit (AMBULATORY_SURGERY_CENTER): Payer: 59 | Admitting: Gastroenterology

## 2013-01-31 VITALS — BP 119/81 | HR 82 | Temp 98.2°F | Resp 21 | Ht 74.0 in | Wt 265.0 lb

## 2013-01-31 DIAGNOSIS — R05 Cough: Secondary | ICD-10-CM

## 2013-01-31 DIAGNOSIS — R059 Cough, unspecified: Secondary | ICD-10-CM

## 2013-01-31 LAB — GLUCOSE, CAPILLARY
Glucose-Capillary: 88 mg/dL (ref 70–99)
Glucose-Capillary: 72 mg/dL (ref 70–99)
Glucose-Capillary: 91 mg/dL (ref 70–99)

## 2013-01-31 MED ORDER — SODIUM CHLORIDE 0.9 % IV SOLN: 500.0000 mL | INTRAVENOUS | Status: DC

## 2013-01-31 MED ORDER — HYDROCOD POLST-CPM POLST ER 10-8 MG PO CP12: 10.0000 "application " | ORAL_CAPSULE | Freq: Two times a day (BID) | ORAL | Status: DC | PRN

## 2013-01-31 NOTE — Progress Notes (Signed)
Per Dr. Arlyce Dice, pt needs a referral with Dr. Sherene Sires for his chronic cough.  LMOM on Linda's voicemail to set this up

## 2013-01-31 NOTE — Progress Notes (Signed)
Report to pacu rn, vss, bbs=clear, patient has chronic cough, does have cardiac clearance from recent ablation, discussed with dr. Arlyce Dice will proceed.Did well ventilating well.

## 2013-01-31 NOTE — Op Note (Signed)
 [facility] [address] [place], [address]   ENDOSCOPY PROCEDURE REPORT  PATIENT: Colin Wood, Colin Wood  MR#: [id number] BIRTHDATE: August 04, 1960 , 52  yrs. old GENDER: Male ENDOSCOPIST: Louis Meckel, MD REFERRED BY: PROCEDURE DATE:  01/31/2013 PROCEDURE:  EGD, diagnostic ASA CLASS:     Class II INDICATIONS:  cough. MEDICATIONS: MAC sedation, administered by CRNA and propofol (Diprivan) 100mg  IV TOPICAL ANESTHETIC:  DESCRIPTION OF PROCEDURE: After the risks benefits and alternatives of the procedure were thoroughly explained, informed consent was obtained.  The [facility] [id number] endoscope was introduced through the mouth and advanced to the third portion of the duodenum. Without limitations.  The instrument was slowly withdrawn as the mucosa was fully examined.      The upper, middle and distal third of the esophagus were carefully inspected and no abnormalities were noted.  The z-line was well seen at the GEJ.  The endoscope was pushed into the fundus which was normal including a retroflexed view.  The antrum, gastric body, first and second part of the duodenum were unremarkable. Retroflexed views revealed no abnormalities.     The scope was then withdrawn from the patient and the procedure completed.  there was moderate edema of the vocal cord  COMPLICATIONS: There were no complications. ENDOSCOPIC IMPRESSION: Normal EGD (cord edema  RECOMMENDATIONS: 1. continue dexilant 2.  gastric emptying scan 3.  pulmonary referral for chronic cyclical coughing REPEAT EXAM:  eSigned:  Louis Meckel, MD 01/31/2013 4:31 PM   FA:OZHY Drue Novel, MD and Lonell Grandchild, MD

## 2013-01-31 NOTE — Progress Notes (Signed)
Patient did not experience any of the following events: a burn prior to discharge; a fall within the facility; wrong site/side/patient/procedure/implant event; or a hospital transfer or hospital admission upon discharge from the facility. (G8907) Patient did not have preoperative order for IV antibiotic SSI prophylaxis. (G8918)  

## 2013-01-31 NOTE — Patient Instructions (Addendum)
YOU HAD AN ENDOSCOPIC PROCEDURE TODAY AT THE [facility]: Refer to the procedure report that was given to you for any specific questions about what was found during the examination.  If the procedure report does not answer your questions, please call your gastroenterologist to clarify.  If you requested that your care partner not be given the details of your procedure findings, then the procedure report has been included in a sealed envelope for you to review at your convenience later.  YOU SHOULD EXPECT: Some feelings of bloating in the abdomen. Passage of more gas than usual.  Walking can help get rid of the air that was put into your GI tract during the procedure and reduce the bloating.  DIET: Your first meal following the procedure should be a light meal and then it is ok to progress to your normal diet.  A half-sandwich or bowl of soup is an example of a good first meal.  Heavy or fried foods are harder to digest and may make you feel nauseous or bloated.  Likewise meals heavy in dairy and vegetables can cause extra gas to form and this can also increase the bloating.  Drink plenty of fluids but you should avoid alcoholic beverages for 24 hours.  ACTIVITY: Your care partner should take you home directly after the procedure.  You should plan to take it easy, moving slowly for the rest of the day.  You can resume normal activity the day after the procedure however you should NOT DRIVE or use heavy machinery for 24 hours (because of the sedation medicines used during the test).    SYMPTOMS TO REPORT IMMEDIATELY: A gastroenterologist can be reached at any hour.  During normal business hours, 8:30 AM to 5:00 PM Monday through Friday, call [phone].  After hours and on weekends, please call the GI answering service at [phone] who will take a message and have the physician on call contact you.   Following upper endoscopy (EGD)  Vomiting of blood or coffee ground material  New  chest pain or pain under the shoulder blades  Painful or persistently difficult swallowing  New shortness of breath  Fever of 100F or higher  Black, tarry-looking stools  FOLLOW UP: Our staff will call the home number listed on your records the next business day following your procedure to check on you and address any questions or concerns that you may have at that time regarding the information given to you following your procedure. This is a courtesy call and so if there is no answer at the home number and we have not heard from you through the emergency physician on call, we will assume that you have returned to your regular daily activities without incident.  SIGNATURES/CONFIDENTIALITY: You and/or your care partner have signed paperwork which will be entered into your electronic medical record.  These signatures attest to the fact that that the information above on your After Visit Summary has been reviewed and is understood.  Full responsibility of the confidentiality of this discharge information lies with you and/or your care-partner.  Continue Dexilant  Dr. Marzetta Board office nurse will call you to set up gastric emptying scan and also to set up appointment with Dr. Sherene Sires, pulmonologist  Continue your other medications

## 2013-02-01 ENCOUNTER — Telehealth: Payer: Self-pay

## 2013-02-01 ENCOUNTER — Other Ambulatory Visit: Payer: Self-pay | Admitting: Gastroenterology

## 2013-02-01 ENCOUNTER — Telehealth: Payer: Self-pay | Admitting: *Deleted

## 2013-02-01 DIAGNOSIS — R109 Unspecified abdominal pain: Secondary | ICD-10-CM

## 2013-02-01 DIAGNOSIS — R05 Cough: Secondary | ICD-10-CM

## 2013-02-01 NOTE — Telephone Encounter (Signed)
  Follow up Call-  Call back number 01/31/2013  Post procedure Call Back phone  # [phone]  Permission to leave phone message Yes     Patient questions:  Do you have a fever, pain , or abdominal swelling? no Pain Score  0 *  Have you tolerated food without any problems? yes  Have you been able to return to your normal activities? yes  Do you have any questions about your discharge instructions: Diet   no Medications  no Follow up visit  no  Do you have questions or concerns about your Care? no  Actions: * If pain score is 4 or above: No action needed, pain <4.

## 2013-02-01 NOTE — Telephone Encounter (Signed)
Pt scheduled already with [facility] for 02/02/13  Pt only sees KC for sleep, so this needs to be rescheduled  Spoke with pt and scheduled consult with MW for tomorrow am at 8:45

## 2013-02-01 NOTE — Telephone Encounter (Signed)
Pt scheduled to see Dr. Shelle Iron 02/02/13 at 10:45am. Pt scheduled for [facility] at [facility] 02/19/13 pt to arrive there at 0645 for a 7am appt. Pt aware of appts.

## 2013-02-01 NOTE — Telephone Encounter (Signed)
Message copied by Christen Butter on Thu Feb 01, 2013  1:17 PM ------      Message from: Sandrea Hughs B      Created: Thu Feb 01, 2013 11:54 AM                   ----- Message -----         From: Louis Meckel, MD         Sent: 02/01/2013   9:31 AM           To: Nyoka Cowden, MD            He's very symptomatic and would benefit from an early appointment.      ----- Message -----         From: Nyoka Cowden, MD         Sent: 01/31/2013   5:03 PM           To: Louis Meckel, MD            My kinda guy - if we need to expedite the ov let me know            thx !                  ----- Message -----         From: Louis Meckel, MD         Sent: 01/31/2013   4:37 PM           To: Nyoka Cowden, MD            [name],      This pt is suffering from a chronic cough, possibly triggered by reflux.  At this point I think he has cyclical coughing.  While working him up for reflux (he has been unresponsive to Rx with high dose PPI) I would appreciate your assistance.      Thanks,      Rob             ------

## 2013-02-02 ENCOUNTER — Ambulatory Visit (INDEPENDENT_AMBULATORY_CARE_PROVIDER_SITE_OTHER)
Admission: RE | Admit: 2013-02-02 | Discharge: 2013-02-02 | Disposition: A | Discharge status: Home / Self Care | Payer: 59 | Source: Ambulatory Visit | Attending: Internal Medicine | Admitting: Internal Medicine

## 2013-02-02 ENCOUNTER — Ambulatory Visit: Payer: 59 | Admitting: Pulmonary Disease

## 2013-02-02 ENCOUNTER — Ambulatory Visit (INDEPENDENT_AMBULATORY_CARE_PROVIDER_SITE_OTHER): Payer: 59 | Admitting: Internal Medicine

## 2013-02-02 ENCOUNTER — Encounter: Payer: Self-pay | Admitting: Internal Medicine

## 2013-02-02 VITALS — BP 120/76 | HR 75 | Temp 97.9°F | Ht 74.0 in | Wt 261.0 lb

## 2013-02-02 DIAGNOSIS — R059 Cough, unspecified: Secondary | ICD-10-CM

## 2013-02-02 DIAGNOSIS — R05 Cough: Secondary | ICD-10-CM

## 2013-02-02 MED ORDER — FAMOTIDINE 20 MG PO TABS: ORAL_TABLET | ORAL | Status: DC

## 2013-02-02 MED ORDER — PREDNISONE (PAK) 10 MG PO TABS: ORAL_TABLET | ORAL | Status: DC

## 2013-02-02 MED ORDER — TRAMADOL HCL 50 MG PO TABS: ORAL_TABLET | ORAL | Status: DC

## 2013-02-02 NOTE — Progress Notes (Signed)
  Subjective:    Patient ID: [place], male    DOB: 1961/01/13  MRN: [id number]  HPI  39 yowm never smoker with h/o sinus problems starting HS in [place] = stuffy nose all year with freq flares helped to move to [place] 1992 and sinus surgery [facility] 2007 but still coughing some post op dx with GERD by ENT  rx gerd and improved but never 100% and then after ET 01/05/13 for ablation much worse since referred to pulmonary clinic 02/02/2013 by Dr Arlyce Dice.   02/02/2013 1st [facility] office visit/ [place] cc "always cough" much worse since woke up from ET worse with talking and after eating and assoc with sore throat intermittently and cough so hard he vomits.  Nose feels stuffy esp when lie down and better sleeping and does not disturb sleep once there.  No obvious daytime variabilty or assoc sob or cp or chest tightness, subjective wheeze overt sinus or hb symptoms. No unusual exp hx or h/o childhood pna/ asthma or knowledge of premature birth.   Sleeping ok without nocturnal  or early am exacerbation  of respiratory  c/o's or need for noct saba. Also denies any obvious fluctuation of symptoms with weather or environmental changes or other aggravating or alleviating factors except as outlined above   Current Medications, Allergies, Past Medical History, Past Surgical History, Family History, and Social History were reviewed in [place] record.         Review of Systems  Constitutional: Negative for fever, chills, activity change, appetite change and unexpected weight change.  HENT: Positive for sore throat. Negative for congestion, rhinorrhea, sneezing, trouble swallowing, dental problem, voice change and postnasal drip.   Eyes: Negative for visual disturbance.  Respiratory: Positive for cough. Negative for choking and shortness of breath.   Cardiovascular: Negative for chest pain and leg swelling.  Gastrointestinal: Negative for nausea, vomiting and abdominal pain.   Genitourinary: Negative for difficulty urinating.  Musculoskeletal: Negative for arthralgias.  Skin: Negative for rash.  Psychiatric/Behavioral: Negative for behavioral problems and confusion.       Objective:   Physical Exam  Wt Readings from Last 3 Encounters:  02/02/13 261 lb (118.389 kg)  01/31/13 265 lb (120.203 kg)  01/22/13 264 lb (119.75 kg)    amb harsh barking quality cough heard from lobby  HEENT: nl dentition, turbinates, and orophanx. Nl external ear canals without cough reflex   NECK :  without JVD/Nodes/TM/ nl carotid upstrokes bilaterally   LUNGS: no acc muscle use, clear to A and P bilaterally without cough on insp or exp maneuvers   CV:  RRR  no s3 or murmur or increase in P2, no edema   ABD:  soft and nontender with nl excursion in the supine position. No bruits or organomegaly, bowel sounds nl  MS:  warm without deformities, calf tenderness, cyanosis or clubbing  SKIN: warm and dry without lesions    NEURO:  alert, approp, no deficits      CXR  02/02/2013 :  No active cardiopulmonary disease.       Assessment & Plan:

## 2013-02-02 NOTE — Patient Instructions (Addendum)
Please remember to go to the  x-ray department downstairs for your tests - we will call you with the results when they are available.  The key to effective treatment for your cough is eliminating the non-stop cycle of cough you're stuck in long enough to let your airway heal completely and then see if there is anything still making you cough once you stop the cough suppression, but this should take no more than 5 days to figure out  First take delsym two tsp every 12 hours and supplement if needed with  tramadol 50 mg up to 2 every 4 hours to suppress the urge to cough at all or even clear your throat. Swallowing water or using ice chips/non mint and menthol containing candies (such as lifesavers or sugarless jolly ranchers) are also effective.  You should rest your voice and avoid activities that you know make you cough.  Once you have eliminated the cough for 3 straight days try reducing the tramadol first,  then the delsym as tolerated.    Continue  dexilant 60 mg  before first meal of the day and Pepcid 20 mg one bedtime until  Return   GERD (REFLUX)  is an extremely common cause of respiratory symptoms, many times with no significant heartburn at all.    It can be treated with medication, but also with lifestyle changes including avoidance of late meals, excessive alcohol, smoking cessation, and avoid fatty foods, chocolate, peppermint, colas, red wine, and acidic juices such as orange juice.  NO MINT OR MENTHOL PRODUCTS SO NO COUGH DROPS  USE SUGARLESS CANDY INSTEAD (jolley ranchers or Stover's)  NO OIL BASED VITAMINS - use powdered substitutes.    Prednisone 10 mg take  4 each am x 2 days,   2 each am x 2 days,  1 each am x 2 days and stop    Please schedule a follow up office visit in 2 weeks, sooner if needed

## 2013-02-02 NOTE — Assessment & Plan Note (Signed)
The most common causes of chronic cough in immunocompetent adults include the following: upper airway cough syndrome (UACS), previously referred to as postnasal drip syndrome (PNDS), which is caused by variety of rhinosinus conditions; (2) asthma; (3) GERD; (4) chronic bronchitis from cigarette smoking or other inhaled environmental irritants; (5) nonasthmatic eosinophilic bronchitis; and (6) bronchiectasis.   These conditions, singly or in combination, have accounted for up to 94% of the causes of chronic cough in prospective studies.   Other conditions have constituted no >6% of the causes in prospective studies These have included bronchogenic carcinoma, chronic interstitial pneumonia, sarcoidosis, left ventricular failure, ACEI-induced cough, and aspiration from a condition associated with pharyngeal dysfunction.    Chronic cough is often simultaneously caused by more than one condition. A single cause has been found from 38 to 82% of the time, multiple causes from 18 to 62%. Multiply caused cough has been the result of three diseases up to 42% of the time.      Of the three most common causes of chronic cough, only one (GERD)  can actually cause the other two (asthma and post nasal drip syndrome)  and perpetuate the cylce of cough inducing airway trauma (originally precipitated by et tube in this case), inflammation, heightened sensitivity to reflux which is prompted by the cough itself via a cyclical mechanism.    This may partially respond to steroids and look like asthma and post nasal drainage but never erradicated completely unless the cough and the secondary reflux are eliminated, preferably both at the same time.  While not intuitively obvious, many patients with chronic low grade reflux do not cough until there is a secondary insult that disturbs the protective epithelial barrier and exposes sensitive nerve endings.  This can be viral or direct physical injury such as with an endotracheal  tube.   The point is that once this occurs, it is difficult to eliminate using anything but a maximally effective acid suppression regimen at least in the short run, accompanied by an appropriate diet to address non acid GERD.   rec max rx for gerd/ cyclical cough then regroup

## 2013-02-06 ENCOUNTER — Telehealth: Payer: Self-pay | Admitting: Internal Medicine

## 2013-02-06 NOTE — Telephone Encounter (Signed)
Patient called because he would like dr hopper to write a written lab order for his prior appointment with [place] on September 25th because patient would like to get his labs done at [facility].

## 2013-02-12 ENCOUNTER — Encounter: Payer: Self-pay | Admitting: Internal Medicine

## 2013-02-12 ENCOUNTER — Ambulatory Visit (INDEPENDENT_AMBULATORY_CARE_PROVIDER_SITE_OTHER): Payer: 59 | Admitting: Internal Medicine

## 2013-02-12 VITALS — BP 123/80 | HR 73 | Ht 72.0 in | Wt 255.2 lb

## 2013-02-12 DIAGNOSIS — R05 Cough: Secondary | ICD-10-CM

## 2013-02-12 DIAGNOSIS — I4891 Unspecified atrial fibrillation: Secondary | ICD-10-CM

## 2013-02-12 MED ORDER — METOPROLOL SUCCINATE ER 50 MG PO TB24: ORAL_TABLET | ORAL | Status: DC

## 2013-02-12 MED ORDER — METOPROLOL SUCCINATE ER 50 MG PO TB24: 100.0000 mg | ORAL_TABLET | Freq: Every day | ORAL | Status: DC

## 2013-02-12 NOTE — Progress Notes (Signed)
PCP:  Willow Ora, MD  The patient presents today for early follow. His cough is much better.  He denies pain with swallowing, chest pain, or SOB.  He is being followed by GI and also Dr Sherene Sires.  Today, he denies symptoms of palpitations, orthopnea, PND, lower extremity edema, dizziness, presyncope, syncope, or neurologic sequela.  The patient feels that he is tolerating medications without difficulties and is otherwise without complaint today.   Past Medical History  Diagnosis Date  . Bipolar disorder     used to see psych, on no meds as of 03/2009  . Rhinitis     vasomotor  . Hypothyroidism   . Hyperlipidemia   . Gout   . GERD (gastroesophageal reflux disease)   . Persistent atrial fibrillation     PVI 12/2012  . Sleep apnea     mild, now treated with CPAP since ~9-12  . Atrial flutter     typical appearing; s/p ablation 12-2012  . Diabetes mellitus     TYPE 2   Past Surgical History  Procedure Laterality Date  . Tonsillectomy    . Vasectomy    . Nasal sinus surgery    . Cardioversion  03/01/11  . Tee without cardioversion N/A 01/04/2013    Procedure: TRANSESOPHAGEAL ECHOCARDIOGRAM (TEE);  Surgeon: Pricilla Riffle, MD;  Location: [facility] ENDOSCOPY;  Service: Cardiovascular;  Laterality: N/A;  . Ablation of dysrhythmic focus  01/05/2013    PVI and flutter ablation by Dr Johney Frame    Current Outpatient Prescriptions  Medication Sig Dispense Refill  . allopurinol (ZYLOPRIM) 300 MG tablet Take 300 mg by mouth daily.      . cholecalciferol (VITAMIN D) 1000 UNITS tablet Take 1,000 Units by mouth daily.      [name] dexlansoprazole (DEXILANT) 60 MG capsule Take 60 mg by mouth 2 (two) times daily.      [name] diltiazem (CARDIZEM CD) 240 MG 24 hr capsule Take 1 capsule (240 mg total) by mouth daily.  30 capsule  11  . dofetilide (TIKOSYN) 250 MCG capsule Take 250 mcg by mouth 2 (two) times daily. Takes 250 mcg at 9:00 am, and 250 mcg at 9:00 pm.      . famotidine (PEPCID) 20 MG tablet One at bedtime  30 tablet   11  . fenofibrate 160 MG tablet Take 160 mg by mouth daily.      [name] glimepiride (AMARYL) 4 MG tablet Take 4 mg by mouth daily before breakfast.      . levothyroxine (SYNTHROID, LEVOTHROID) 200 MCG tablet Take 200 mcg by mouth daily.      . metFORMIN (GLUCOPHAGE) 1000 MG tablet Take 1,000 mg by mouth 2 (two) times daily with a meal.      . metoprolol succinate (TOPROL-XL) 100 MG 24 hr tablet Take 100 mg by mouth at bedtime.      . Rivaroxaban (XARELTO) 20 MG TABS Take 20 mg by mouth at bedtime.       No current facility-administered medications for this visit.   Facility-Administered Medications Ordered in Other Visits  Medication Dose Route Frequency Provider Last Rate Last Dose  . 0.9 %  sodium chloride infusion   Intravenous Continuous Pricilla Riffle, MD        No Known Allergies  History   Social History  . Marital Status: Married    Spouse Name: N/A    Number of Children: 2  . Years of Education: N/A   Occupational History  . works at [employer]  Social History Main Topics  . Smoking status: Never Smoker   . Smokeless tobacco: Never Used  . Alcohol Use: No  . Drug Use: No  . Sexual Activity: Not on file   Other Topics Concern  . Not on file   Social History Narrative   Pt lives in [place] with spouse and children 51 and 58 y/o.  Works in Consulting civil engineer at [employer].    Family History  Problem Relation Age of Onset  . Diabetes Father   . Hypertension Brother   . Melanoma Brother   . Asthma Mother   . Allergies Daughter   . Cancer Maternal Uncle     throat  . Throat cancer Maternal Uncle   . Colon cancer Paternal Grandfather   . Prostate cancer Neg Hx    Physical Exam: Filed Vitals:   02/12/13 0846  BP: 123/80  Pulse: 73  Height: 6' (1.829 m)  Weight: 255 lb 3.2 oz (115.758 kg)    GEN- The patient is well appearing, alert and oriented x 3 today.   Head- normocephalic, atraumatic Eyes-  Sclera clear, conjunctiva pink Ears- hearing intact Oropharynx-  clear Neck- supple, no JVP Lymph- no cervical lymphadenopathy Lungs- Clear to ausculation bilaterally, normal work of breathing Heart- Regular rate and rhythm with frequent ectopy, no murmurs, rubs or gallops, PMI not laterally displaced GI- soft, NT, ND, + BS Extremities- no clubbing, cyanosis, or edema, mild ecchymosis, no hematoma/bruit  Assessment and Plan:  1. Cough  Much improved Follow-up with Dr Drue Novel, Sherene Sires, and GI Decrease toprol to 50mg  daily  2. afib Stable Continue cardizem to 240mg  daily Consider decreasing toprol to 25mg  daily if heart rates remain stable upon return  Return as scheduled in 6 weeks   Return to see me in 3 weeks

## 2013-02-12 NOTE — Patient Instructions (Signed)
Your physician recommends that you schedule a follow-up appointment as scheduled  Your physician has recommended you make the following change in your medication:  1) Decrease Metoprolol to 50mg  daily

## 2013-02-13 ENCOUNTER — Ambulatory Visit: Payer: 59 | Admitting: Internal Medicine

## 2013-02-15 ENCOUNTER — Telehealth: Payer: Self-pay | Admitting: Internal Medicine

## 2013-02-15 NOTE — Telephone Encounter (Signed)
Patient is calling back about this over 1 week later (see note from closed phn encounter below). He has a FU appointment scheduled for 02/22/13 and wants to get his labs done ahead of time at [employer]. Needs to come pick up lab order. Please call when ready.     Charity B McCallum at 02/06/2013 4:00 PM   Status: Signed            Patient called because he would like dr hopper to write a written lab order for his prior appointment with [place] on September 25th because patient would like to get his labs done at [facility].

## 2013-02-16 ENCOUNTER — Encounter: Payer: Self-pay | Admitting: Internal Medicine

## 2013-02-16 ENCOUNTER — Ambulatory Visit (INDEPENDENT_AMBULATORY_CARE_PROVIDER_SITE_OTHER): Payer: 59 | Admitting: Internal Medicine

## 2013-02-16 ENCOUNTER — Ambulatory Visit (INDEPENDENT_AMBULATORY_CARE_PROVIDER_SITE_OTHER)
Admission: RE | Admit: 2013-02-16 | Discharge: 2013-02-16 | Disposition: A | Discharge status: Home / Self Care | Payer: 59 | Source: Ambulatory Visit | Attending: Internal Medicine | Admitting: Internal Medicine

## 2013-02-16 VITALS — BP 110/62 | HR 105 | Temp 97.8°F | Ht 74.0 in | Wt 257.2 lb

## 2013-02-16 DIAGNOSIS — J310 Chronic rhinitis: Secondary | ICD-10-CM

## 2013-02-16 DIAGNOSIS — R05 Cough: Secondary | ICD-10-CM

## 2013-02-16 MED ORDER — GABAPENTIN 100 MG PO CAPS: 100.0000 mg | ORAL_CAPSULE | Freq: Three times a day (TID) | ORAL | Status: DC

## 2013-02-16 MED ORDER — AMOXICILLIN-POT CLAVULANATE 875-125 MG PO TABS: 1.0000 | ORAL_TABLET | Freq: Two times a day (BID) | ORAL | Status: DC

## 2013-02-16 MED ORDER — ACETAMINOPHEN-CODEINE #4 300-60 MG PO TABS: 1.0000 | ORAL_TABLET | ORAL | Status: DC | PRN

## 2013-02-16 MED ORDER — PREDNISONE (PAK) 10 MG PO TABS: ORAL_TABLET | ORAL | Status: DC

## 2013-02-16 NOTE — Patient Instructions (Addendum)
The key to effective treatment for your cough is eliminating the non-stop cycle of cough you're stuck in long enough to let your airway heal completely and then see if there is anything still making you cough once you stop the cough suppression, but this should take no more than 5 days to figure out  First take delsym two tsp every 12 hours and supplement if needed with tylenol #4 every 4 hours to suppress the urge to cough at all or even clear your throat. Swallowing water or using ice chips/non mint and menthol containing candies (such as lifesavers or sugarless jolly ranchers) are also effective.  You should rest your voice and avoid activities that you know make you cough.  Once you have eliminated the cough for 3 straight daysTylenol #4   then the delsym as tolerated.    Continue  dexilant 60 mg  before first meal of the day and Pepcid 20 mg one bedtime    Prednisone 10 mg take  4 each am x 2 days,   2 each am x 2 days,  1 each am x 2 days and stop   Neurontin 100 mg three times a day   Augmentin 875 twice daily x 10 days    GERD (REFLUX)  is an extremely common cause of respiratory symptoms, many times with no significant heartburn at all.    It can be treated with medication, but also with lifestyle changes including avoidance of late meals, excessive alcohol, smoking cessation, and avoid fatty foods, chocolate, peppermint, colas, red wine, and acidic juices such as orange juice.  NO MINT OR MENTHOL PRODUCTS SO NO COUGH DROPS  USE SUGARLESS CANDY INSTEAD (jolley ranchers or Stover's)  NO OIL BASED VITAMINS - use powdered substitutes.    Please see patient coordinator before you leave today  to schedule sinus CT   Please remember to go to the lab   department downstairs for your tests - we will call you with the results when they are available.  See Tammy NP w/in 2 weeks with all your medications, even over the counter meds, separated in two separate bags, the ones you take no matter  what vs the ones you stop once you feel better and take only as needed when you feel you need them.   Tammy  will generate for you a new user friendly medication calendar that will put [place] all on the same page re: your medication use.     Without this process, it simply isn't possible to assure that we are providing  your outpatient care  with  the attention to detail we feel you deserve.   If we cannot assure that you're getting that kind of care,  then we cannot manage your problem effectively from this clinic.  Once you have seen Tammy and we are sure that we're all on the same page with your medication use she will arrange follow up with me.  Late add: note did not go to lab for allergy profile

## 2013-02-16 NOTE — Progress Notes (Signed)
Quick Note:  Spoke with pt and notified of results per Dr. Wert. Pt verbalized understanding and denied any questions.  ______ 

## 2013-02-16 NOTE — Progress Notes (Signed)
Subjective:    Patient ID: Colin Wood, male    DOB: 03-28-1961  MRN: [id number]    Brief patient profile:  52 yowm never smoker with h/o sinus problems assoc with severe cough starting HS in [place] = stuffy nose all year with freq flares helped to move to [facility] 1992 and sinus surgery [facility] 2007 but still coughing some post op dx with GERD by ENT  rx gerd and improved but never 100% and then after ET 01/05/13 for ablation much worse since referred to pulmonary clinic 02/02/2013 by Dr Arlyce Dice.  HPI 02/02/2013 1st Colin Wood visit/ Colin Wood cc "always cough" since age 52 (can't answer the question of? when was the last time you weren't coughing?)  much worse since woke up from ET 01/05/13 worse with talking and after eating and assoc with sore throat intermittently and cough so hard he vomits.  Nose feels stuffy esp when lie down and better sleeping and does not disturb sleep once there. rec Cyclical cough regimen   02/16/2013 f/u ov/Author [place] re: cough Chief Complaint  Patient presents with  . Follow-up    Pt reports his cough is about 50% better but still there. Only yesterday he brough up yellow and green phlem. Yesterday did have PND and nasal congestion. Talking makes cough worse.   no sob unless coughing. Cough much worse day than night.  No obvious day to day or daytime variabilty or assoc chronic cough or cp or chest tightness, subjective wheeze overt sinus or hb symptoms. No unusual exp hx or h/o childhood pna/ asthma or knowledge of premature birth.  Sleeping ok without nocturnal  or early am exacerbation  of respiratory  c/o's or need for noct saba. Also denies any obvious fluctuation of symptoms with weather or environmental changes or other aggravating or alleviating factors except as outlined above   Current Medications, Allergies, Complete Past Medical History, Past Surgical History, Family History, and Social History were reviewed in [employer]  record.  ROS  The following are not active complaints unless bolded sore throat, dysphagia, dental problems, itching, sneezing,  nasal congestion or excess/ purulent secretions, ear ache,   fever, chills, sweats, unintended wt loss, pleuritic or exertional cp, hemoptysis,  orthopnea pnd or leg swelling, presyncope, palpitations, heartburn, abdominal pain, anorexia, nausea, vomiting, diarrhea  or change in bowel or urinary habits, change in stools or urine, dysuria,hematuria,  rash, arthralgias, visual complaints, headache, numbness weakness or ataxia or problems with walking or coordination,  change in mood/affect or memory.                Objective:   Physical Exam   Wt Readings from Last 3 Encounters:  02/16/13 257 lb 3.2 oz (116.665 kg)  02/12/13 255 lb 3.2 oz (115.758 kg)  02/02/13 261 lb (118.389 kg)       amb harsh barking quality cough heard from [place] with door closed   HEENT: nl dentition, turbinates, and orophanx. Nl external ear canals without cough reflex   NECK :  without JVD/Nodes/TM/ nl carotid upstrokes bilaterally   LUNGS: no acc muscle use, clear to A and P bilaterally without cough early  insp  maneuvers   CV:  RRR  no s3 or murmur or increase in P2, no edema   ABD:  soft and nontender with nl excursion in the supine position. No bruits or organomegaly, bowel sounds nl  MS:  warm without deformities, calf tenderness, cyanosis or clubbing  SKIN: warm and  dry without lesions    NEURO:  alert, approp, no deficits      CXR  02/02/2013 :  No active cardiopulmonary disease.       Assessment & Plan:

## 2013-02-16 NOTE — Telephone Encounter (Signed)
Fax these orders to the patient: CMP, A1c---dx diabetes TSH--- dx hypothyroidism FLP--- hyperlipidemia

## 2013-02-17 NOTE — Assessment & Plan Note (Signed)
The standardized cough guidelines published in [facility] by [place] in 2006 are still the best available and consist of a multiple step process (up to 12!) , not a single office visit,  and are intended  to address this problem logically,  with an alogrithm dependent on response to empiric treatment at  each progressive step  to determine a specific diagnosis with  minimal addtional testing needed. Therefore if adherence is an issue or can't be accurately verified,  it's very unlikely the standard evaluation and treatment will be successful here.    Furthermore, response to therapy (other than acute cough suppression, which should only be used short term with avoidance of narcotic containing cough syrups if possible), can be a gradual process for which the patient may perceive immediate benefit.  Unlike going to an eye doctor where the best perscription is almost always the first one and is immediately effective, this is almost never the case in the management of chronic cough syndromes. Therefore the patient needs to commit up front to consistently adhere to recommendations  for up to 6 weeks of therapy directed at the likely underlying problem(s) before the response can be reasonably evaluated.    For now max effort to eliminated cyclical cough and add neurontin 100 mg tid then regroup using a trust but verify approach before adding additional rx.    Each maintenance medication was reviewed in detail including most importantly the difference between maintenance and as needed and under what circumstances the prns are to be used.  Please see instructions for details which were reviewed in writing and the patient given a copy.

## 2013-02-17 NOTE — Assessment & Plan Note (Signed)
Sinus ct 02/16/2013  1. Left maxillary and ethmoid sinus disease, suspect inspissated secretions and suggestion of previous maxillary antrostomies. 2. Mild to moderate right maxillary and ethmoid mucosal thickening.   rx with augmentin x 10 days since reports purulent sputum but doubt this is the main cause of cough going back so long he can't recall the last time he wasn't coughing

## 2013-02-19 ENCOUNTER — Encounter (HOSPITAL_COMMUNITY): Payer: 59

## 2013-02-19 NOTE — Telephone Encounter (Signed)
Done. Pt notifed. DJR

## 2013-02-22 ENCOUNTER — Ambulatory Visit (INDEPENDENT_AMBULATORY_CARE_PROVIDER_SITE_OTHER): Payer: 59 | Admitting: Internal Medicine

## 2013-02-22 ENCOUNTER — Encounter: Payer: Self-pay | Admitting: Internal Medicine

## 2013-02-22 VITALS — BP 161/93 | HR 67 | Temp 98.4°F | Wt 260.0 lb

## 2013-02-22 DIAGNOSIS — E785 Hyperlipidemia, unspecified: Secondary | ICD-10-CM

## 2013-02-22 DIAGNOSIS — E039 Hypothyroidism, unspecified: Secondary | ICD-10-CM

## 2013-02-22 DIAGNOSIS — R609 Edema, unspecified: Secondary | ICD-10-CM

## 2013-02-22 DIAGNOSIS — R05 Cough: Secondary | ICD-10-CM

## 2013-02-22 DIAGNOSIS — R059 Cough, unspecified: Secondary | ICD-10-CM

## 2013-02-22 DIAGNOSIS — E119 Type 2 diabetes mellitus without complications: Secondary | ICD-10-CM

## 2013-02-22 NOTE — Progress Notes (Signed)
  Subjective:    Patient ID: Colin Wood, male    DOB: March 09, 1961, 52 y.o.   MRN: [id number]  HPI Routine office visit Diabetes-- good medication compliance, ambulatory CBGs always less than 140. Cough---improving but still there, has seen GI and pulmonary. Finishing a round of prednisone, still has some Augmentin High cholesterol, good compliance with fenofibrate Hypothyroidism, good compliance with medication  Past Medical History  Diagnosis Date  . Bipolar disorder     used to see psych, on no meds as of 03/2009  . Rhinitis     vasomotor  . Hypothyroidism   . Hyperlipidemia   . Gout   . GERD (gastroesophageal reflux disease)   . Persistent atrial fibrillation     PVI 12/2012  . Sleep apnea     mild, now treated with CPAP since ~9-12  . Atrial flutter     typical appearing; s/p ablation 12-2012  . Diabetes mellitus     TYPE 2   Past Surgical History  Procedure Laterality Date  . Tonsillectomy    . Vasectomy    . Nasal sinus surgery    . Cardioversion  03/01/11  . Tee without cardioversion N/A 01/04/2013    Procedure: TRANSESOPHAGEAL ECHOCARDIOGRAM (TEE);  Surgeon: Pricilla Riffle, MD;  Location: [facility] ENDOSCOPY;  Service: Cardiovascular;  Laterality: N/A;  . Ablation of dysrhythmic focus  01/05/2013    PVI and flutter ablation by Dr Johney Frame    Review of Systems No fever chills, no sputum production No chest pain, shortness or breath or palpitations No nausea or vomiting.    Objective:   Physical Exam BP 161/93  Pulse 67  Temp(Src) 98.4 F (36.9 C)  Wt 260 lb (117.935 kg)  BMI 33.37 kg/m2  SpO2 99% General -- alert, well-developed, NAD Except for persistent cough Lungs -- normal respiratory effort, no intercostal retractions, no accessory muscle use, and normal breath sounds.  Heart-- Seems regular today, no murmur Extremities-- no pretibial edema bilaterally  Neurologic--  alert & oriented X3.  Speech normal, gait normal, strength normal in all extremities.   Psych-- Cognition and judgment appear intact. Cooperative with normal attention span and concentration. No anxious appearing , no depressed appearing.       Assessment & Plan:  BP elevated today, BPs available in the system normal. No change, observation

## 2013-02-22 NOTE — Assessment & Plan Note (Signed)
Finishing prednisone, still has some Augmentin, cough much better but still there. Followup by pulmonary and GI

## 2013-02-22 NOTE — Assessment & Plan Note (Addendum)
Reports is improved since he had the cardiac Ablation

## 2013-02-22 NOTE — Assessment & Plan Note (Signed)
A1c 02/19/2013 6.5. Denies hypoglycemia Plan: No change

## 2013-02-22 NOTE — Assessment & Plan Note (Signed)
Had a cholesterol panel in few days ago, results pending, good compliance with medication

## 2013-02-22 NOTE — Assessment & Plan Note (Signed)
TSH 02/19/2013-- 1.2, no change

## 2013-02-25 ENCOUNTER — Telehealth: Payer: Self-pay | Admitting: Internal Medicine

## 2013-02-25 NOTE — Telephone Encounter (Signed)
Labs from 02/19/2013: BMP, LFTs normal.  Total cholesterol 182, triglycerides 138, LDL 180. Advise patient, these are good results, LDL is very close to the ideal of 100. Continue with same medications.

## 2013-02-26 NOTE — Telephone Encounter (Signed)
Pt notified via tele. DJR  

## 2013-03-01 ENCOUNTER — Telehealth: Payer: Self-pay | Admitting: Internal Medicine

## 2013-03-01 MED ORDER — METOPROLOL SUCCINATE ER 50 MG PO TB24: ORAL_TABLET | ORAL | Status: DC

## 2013-03-01 NOTE — Telephone Encounter (Signed)
Spoke with pt.  He has ordered his Tikosyn from his mail order pharmacy but it will not be in before he runs out.  Will leave a 7 day supply at the front desk for the patient.

## 2013-03-01 NOTE — Telephone Encounter (Signed)
New Problem  Tikosyn medication inquiry.. Declined giving details.. Request a call back to discuss

## 2013-03-02 ENCOUNTER — Encounter (HOSPITAL_COMMUNITY): Payer: 59

## 2013-03-05 ENCOUNTER — Encounter: Payer: 59 | Admitting: Adult Health

## 2013-03-06 ENCOUNTER — Ambulatory Visit (INDEPENDENT_AMBULATORY_CARE_PROVIDER_SITE_OTHER): Payer: 59 | Admitting: Adult Health

## 2013-03-06 ENCOUNTER — Encounter: Payer: Self-pay | Admitting: Adult Health

## 2013-03-06 VITALS — BP 138/80 | HR 76 | Temp 97.0°F | Ht 74.0 in | Wt 261.6 lb

## 2013-03-06 DIAGNOSIS — R05 Cough: Secondary | ICD-10-CM

## 2013-03-06 MED ORDER — ACETAMINOPHEN-CODEINE #4 300-60 MG PO TABS: 1.0000 | ORAL_TABLET | ORAL | Status: DC | PRN

## 2013-03-06 MED ORDER — BENZONATATE 100 MG PO CAPS: 100.0000 mg | ORAL_CAPSULE | Freq: Three times a day (TID) | ORAL | Status: DC | PRN

## 2013-03-06 NOTE — Patient Instructions (Addendum)
Continue  dexilant 60 mg  before first meal of the day and Pepcid 20 mg one bedtime Continue on Neurontin 100 mg three times a day  Use Delsym 2 tsp Twice daily   Use Tylenol #4 1 every 4hrs as needed for breakthrough cough. This may make you sleepy.  Use Tessalon 100mg  1 every 4hr to help with cough while you are at work.  Goal is to be cough free. Use sips of water to prevent throat clearing.  Go for lab work as directed.  Follow up in 4 weeks for med calendar.

## 2013-03-06 NOTE — Progress Notes (Signed)
Subjective:    Patient ID: Colin Wood, male    DOB: 05-23-1961  MRN: [id number]  Brief patient profile:  52 yowm never smoker with h/o sinus problems assoc with severe cough starting HS in [place] = stuffy nose all year with freq flares helped to move to [facility] 1992 and sinus surgery [facility] 2007 but still coughing some post op dx with GERD by ENT  rx gerd and improved but never 100% and then after ET 01/05/13 for ablation much worse since referred to pulmonary clinic 02/02/2013 by Dr Arlyce Dice.  HPI 02/02/2013 1st [facility] [place] [facility] office visit/ Wert cc "always cough" since age 2 (can't answer the question of? when was the last time you weren't coughing?)  much worse since woke up from ET 01/05/13 worse with talking and after eating and assoc with sore throat intermittently and cough so hard he vomits.  Nose feels stuffy esp when lie down and better sleeping and does not disturb sleep once there. rec Cyclical cough regimen   02/16/2013 f/u ov/[place] re: cough Chief Complaint  Patient presents with  . Follow-up    Pt reports his cough is about 50% better but still there. Only yesterday he brough up yellow and green phlem. Yesterday did have PND and nasal congestion. Talking makes cough worse.   no sob unless coughing. Cough much worse day than night. >>delsym, dexilant/pepcid, tylenol #4, , pred taper and augmentin 10 d ,  neurontin rx   03/06/2013 Follow up and Med calendar  Returns for follow up and med review .  Unfortunately  pt did not bring meds today.  reports cough is 50% improved since last ov.   Seen last ov for cyclical cough, tx with GERD prevention. Tx w/ augmentin and pred taper. Started on neurontin Three times a day  . Cough is worse after eating and talking. Has frequent throat clearing.  Was started on delsym and tylenol #4 for cyclical cough however never started on delsym, Using Tylenol #4 occasionally, none for last couple of days. Cant take and drive.  Did not go for labs as  recommended.  Tylenol #4 does help some.  CT sinus showed  Left maxillary and ethmoid sinus disease, suspect inspissated secretions and suggestion of previous maxillary antrostomies. . Mild to moderate right maxillary and ethmoid mucosal thickening. Did not bring meds as recommended.       Current Medications, Allergies, Complete Past Medical History, Past Surgical History, Family History, and Social History were reviewed in [employer] record.  ROS  The following are not active complaints unless bolded sore throat, dysphagia, dental problems, itching, sneezing,  nasal congestion or excess/ purulent secretions, ear ache,   fever, chills, sweats, unintended wt loss, pleuritic or exertional cp, hemoptysis,  orthopnea pnd or leg swelling, presyncope, palpitations, heartburn, abdominal pain, anorexia, nausea, vomiting, diarrhea  or change in bowel or urinary habits, change in stools or urine, dysuria,hematuria,  rash, arthralgias, visual complaints, headache, numbness weakness or ataxia or problems with walking or coordination,  change in mood/affect or memory.                Objective:   Physical Exam      amb harsh barking quality cough  HEENT: nl dentition, turbinates, and orophanx. Nl external ear canals without cough reflex   NECK :  without JVD/Nodes/TM/ nl carotid upstrokes bilaterally   LUNGS: no acc muscle use, clear to A and P bilaterally without cough early  insp  maneuvers  CV:  RRR  no s3 or murmur or increase in P2, no edema   ABD:  soft and nontender with nl excursion in the supine position. No bruits or organomegaly, bowel sounds nl  MS:  warm without deformities, calf tenderness, cyanosis or clubbing  SKIN: warm and dry without lesions    NEURO:  alert, approp, no deficits      CXR  02/02/2013 :  No active cardiopulmonary disease.       Assessment & Plan:

## 2013-03-06 NOTE — Assessment & Plan Note (Signed)
Cyclical cough with some improvement with GERD prevention , neurontin rx and cough suppression. Has finished all abx and steroids  -sent for labs today , wants done at [facility].  Will return for med calendar   Plan  Continue  dexilant 60 mg  before first meal of the day and Pepcid 20 mg one bedtime Continue on Neurontin 100 mg three times a day  Use Delsym 2 tsp Twice daily   Use Tylenol #4 1 every 4hrs as needed for breakthrough cough. This may make you sleepy.  Use Tessalon 100mg  1 every 4hr to help with cough while you are at work.  Goal is to be cough free. Use sips of water to prevent throat clearing.  Go for lab work as directed.  Follow up in 4 weeks for med calendar.

## 2013-03-09 ENCOUNTER — Telehealth: Payer: Self-pay | Admitting: Internal Medicine

## 2013-03-09 ENCOUNTER — Other Ambulatory Visit: Payer: Self-pay | Admitting: *Deleted

## 2013-03-09 MED ORDER — DOFETILIDE 250 MCG PO CAPS: 250.0000 ug | ORAL_CAPSULE | Freq: Two times a day (BID) | ORAL | Status: DC

## 2013-03-09 NOTE — Telephone Encounter (Signed)
New Problem  Pt calling regarding medication TIkosyn.. No details. Please assist

## 2013-03-09 NOTE — Telephone Encounter (Signed)
Called patient back, and left him a message to return my call

## 2013-03-12 ENCOUNTER — Encounter: Payer: Self-pay | Admitting: Internal Medicine

## 2013-03-13 NOTE — Telephone Encounter (Signed)
Left message to return my call.  

## 2013-03-19 NOTE — Telephone Encounter (Signed)
Needed a refill on his medications and Audrie Lia, Pharm D has already taken care of

## 2013-03-26 ENCOUNTER — Encounter (HOSPITAL_COMMUNITY): Payer: 59

## 2013-03-29 ENCOUNTER — Encounter: Payer: Self-pay | Admitting: Internal Medicine

## 2013-03-29 ENCOUNTER — Ambulatory Visit (INDEPENDENT_AMBULATORY_CARE_PROVIDER_SITE_OTHER): Payer: 59 | Admitting: Internal Medicine

## 2013-03-29 VITALS — BP 125/74 | HR 77 | Ht 74.0 in | Wt 266.4 lb

## 2013-03-29 DIAGNOSIS — I4891 Unspecified atrial fibrillation: Secondary | ICD-10-CM

## 2013-03-29 MED ORDER — METOPROLOL SUCCINATE ER 25 MG PO TB24: 25.0000 mg | ORAL_TABLET | Freq: Every day | ORAL | Status: DC

## 2013-03-29 NOTE — Patient Instructions (Signed)
Your physician recommends that you schedule a follow-up appointment in: 3 months with Dr Johney Frame  Your physician has recommended you make the following change in your medication:  1) Stop Tikosyn 2) Decrease Toprol to 25mg  daily

## 2013-03-29 NOTE — Progress Notes (Signed)
PCP: Willow Ora, MD Primary Cardiologist: Dr Kary Kos Colin Wood is a 52 y.o. male who presents today for routine electrophysiology followup.  Since last being seen in our clinic, the patient reports doing very well.  His cough has nearly resolved.  He has had no symptoms of afib since his ablation. Today, he denies symptoms of palpitations, chest pain, shortness of breath,  lower extremity edema, dizziness, presyncope, or syncope.  The patient is otherwise without complaint today.   Past Medical History  Diagnosis Date  . Bipolar disorder     used to see psych, on no meds as of 03/2009  . Rhinitis     vasomotor  . Hypothyroidism   . Hyperlipidemia   . Gout   . GERD (gastroesophageal reflux disease)   . Persistent atrial fibrillation     PVI 12/2012  . Sleep apnea     mild, now treated with CPAP since ~9-12  . Atrial flutter     typical appearing; s/p ablation 12-2012  . Diabetes mellitus     TYPE 2   Past Surgical History  Procedure Laterality Date  . Tonsillectomy    . Vasectomy    . Nasal sinus surgery    . Cardioversion  03/01/11  . Tee without cardioversion N/A 01/04/2013    Procedure: TRANSESOPHAGEAL ECHOCARDIOGRAM (TEE);  Surgeon: Pricilla Riffle, MD;  Location[facility] ENDOSCOPY;  Service: Cardiovascular;  Laterality: N/A;  . Ablation of dysrhythmic focus  01/05/2013    PVI and flutter ablation by Dr Johney Frame    Current Outpatient Prescriptions  Medication Sig Dispense Refill  . acetaminophen-codeine (TYLENOL #4) 300-60 MG per tablet Take 1 tablet by mouth 2 (two) times daily.      [name] allopurinol (ZYLOPRIM) 300 MG tablet Take 300 mg by mouth daily.      . benzonatate (TESSALON) 100 MG capsule Take 100 mg by mouth 2 (two) times daily.      . cholecalciferol (VITAMIN D) 1000 UNITS tablet Take 1,000 Units by mouth daily.      [name] dexlansoprazole (DEXILANT) 60 MG capsule Take 60 mg by mouth daily.       [name] diltiazem (CARDIZEM CD) 240 MG 24 hr capsule Take 1 capsule (240 mg total)  by mouth daily.  30 capsule  11  . dofetilide (TIKOSYN) 250 MCG capsule Take 1 capsule (250 mcg total) by mouth 2 (two) times daily. Takes 250 mcg at 9:00 am, and 250 mcg at 9:00 pm.  60 capsule  0  . famotidine (PEPCID) 20 MG tablet One at bedtime  30 tablet  11  . fenofibrate 160 MG tablet Take 160 mg by mouth daily.      [name] gabapentin (NEURONTIN) 100 MG capsule Take 1 capsule (100 mg total) by mouth 3 (three) times daily. One three times daily  90 capsule  2  . glimepiride (AMARYL) 4 MG tablet Take 4 mg by mouth daily before breakfast.      . levothyroxine (SYNTHROID, LEVOTHROID) 200 MCG tablet Take 200 mcg by mouth daily.      . metFORMIN (GLUCOPHAGE) 1000 MG tablet Take 1,000 mg by mouth 2 (two) times daily with a meal.      . metoprolol succinate (TOPROL-XL) 50 MG 24 hr tablet Take one daily  30 tablet  0  . Rivaroxaban (XARELTO) 20 MG TABS Take 20 mg by mouth at bedtime.       No current facility-administered medications for this visit.    Physical Exam:  Filed Vitals:   03/29/13 1336  BP: 125/74  Pulse: 77  Height: 6\' 2"  (1.88 m)  Weight: 266 lb 6.4 oz (120.838 kg)    GEN- The patient is well appearing, alert and oriented x 3 today.   Head- normocephalic, atraumatic Eyes-  Sclera clear, conjunctiva pink Ears- hearing intact Oropharynx- clear Lungs- Clear to ausculation bilaterally, normal work of breathing Heart- Regular rate and rhythm, no murmurs, rubs or gallops, PMI not laterally displaced GI- soft, NT, ND, + BS Extremities- no clubbing, cyanosis, or edema  ekg today reveals sinus rhythm 73 bpm, incomplete RBBB, LAD, nonspecific St/T changes  Assessment and Plan:  1. afib Doing well s/p ablation Stop tikosyn Decrease toprol to 25mg  daily Continue xarelto  Return in 3 months

## 2013-03-30 ENCOUNTER — Encounter: Payer: Self-pay | Admitting: Internal Medicine

## 2013-04-04 ENCOUNTER — Encounter: Payer: 59 | Admitting: Adult Health

## 2013-04-11 ENCOUNTER — Encounter: Payer: 59 | Admitting: Adult Health

## 2013-04-23 ENCOUNTER — Other Ambulatory Visit: Payer: Self-pay | Admitting: Internal Medicine

## 2013-04-23 DIAGNOSIS — R05 Cough: Secondary | ICD-10-CM

## 2013-04-23 MED ORDER — FAMOTIDINE 20 MG PO TABS: ORAL_TABLET | ORAL | Status: DC

## 2013-05-01 ENCOUNTER — Encounter: Payer: 59 | Admitting: Adult Health

## 2013-05-28 ENCOUNTER — Other Ambulatory Visit: Payer: Self-pay | Admitting: *Deleted

## 2013-05-28 MED ORDER — FENOFIBRATE 160 MG PO TABS: 160.0000 mg | ORAL_TABLET | Freq: Every day | ORAL | Status: DC

## 2013-05-28 MED ORDER — ALLOPURINOL 300 MG PO TABS: 300.0000 mg | ORAL_TABLET | Freq: Every day | ORAL | Status: DC

## 2013-05-28 MED ORDER — GLIMEPIRIDE 4 MG PO TABS: 4.0000 mg | ORAL_TABLET | Freq: Every day | ORAL | Status: DC

## 2013-05-29 ENCOUNTER — Encounter: Payer: Self-pay | Admitting: Internal Medicine

## 2013-05-30 ENCOUNTER — Encounter: Payer: 59 | Admitting: Adult Health

## 2013-06-13 ENCOUNTER — Ambulatory Visit (INDEPENDENT_AMBULATORY_CARE_PROVIDER_SITE_OTHER): Payer: 59 | Admitting: Internal Medicine

## 2013-06-13 ENCOUNTER — Encounter: Payer: Self-pay | Admitting: Internal Medicine

## 2013-06-13 VITALS — BP 132/94 | HR 89 | Ht 74.0 in | Wt 266.8 lb

## 2013-06-13 DIAGNOSIS — I4892 Unspecified atrial flutter: Secondary | ICD-10-CM

## 2013-06-13 DIAGNOSIS — I4891 Unspecified atrial fibrillation: Secondary | ICD-10-CM

## 2013-06-13 MED ORDER — METOPROLOL SUCCINATE ER 25 MG PO TB24: 25.0000 mg | ORAL_TABLET | Freq: Every day | ORAL | Status: DC

## 2013-06-13 MED ORDER — DILTIAZEM HCL ER COATED BEADS 240 MG PO CP24: 240.0000 mg | ORAL_CAPSULE | Freq: Every day | ORAL | Status: DC

## 2013-06-13 MED ORDER — RIVAROXABAN 20 MG PO TABS: 20.0000 mg | ORAL_TABLET | Freq: Every day | ORAL | Status: DC

## 2013-06-13 NOTE — Patient Instructions (Signed)
Your physician recommends that you schedule a follow-up appointment in 3 months with Dr Allred    

## 2013-06-13 NOTE — Progress Notes (Signed)
PCP: [name], MD Primary Cardiologist: Dr Adela Glimpse ESAU FRIDMAN is a 53 y.o. male who presents today for routine electrophysiology followup.  Since last being seen in our clinic, the patient reports doing very well.  His cough has resolved.  He has had some rare palpitations but feels that his afib is controlled.  He is pleased with the results of his ablation. Today, he denies symptoms of palpitations, chest pain, shortness of breath,  lower extremity edema, dizziness, presyncope, or syncope.  The patient is otherwise without complaint today.   Past Medical History  Diagnosis Date  . Bipolar disorder     used to see psych, on no meds as of 03/2009  . Rhinitis     vasomotor  . Hypothyroidism   . Hyperlipidemia   . Gout   . GERD (gastroesophageal reflux disease)   . Persistent atrial fibrillation     PVI 12/2012  . Sleep apnea     mild, now treated with CPAP since ~9-12  . Atrial flutter     typical appearing; s/p ablation 12-2012  . Diabetes mellitus     TYPE 2   Past Surgical History  Procedure Laterality Date  . Tonsillectomy    . Vasectomy    . Nasal sinus surgery    . Cardioversion  03/01/11  . Tee without cardioversion N/A 01/04/2013    Procedure: TRANSESOPHAGEAL ECHOCARDIOGRAM (TEE);  Surgeon: [name], MD;  Location: [place];  Service: Cardiovascular;  Laterality: N/A;  . Ablation of dysrhythmic focus  01/05/2013    PVI and flutter ablation by [name]    Current Outpatient Prescriptions  Medication Sig Dispense Refill  . benzonatate (TESSALON) 100 MG capsule Take 100 mg by mouth 2 (two) times daily.      [name] dexlansoprazole (DEXILANT) 60 MG capsule Take 60 mg by mouth daily.       [name] diltiazem (CARDIZEM CD) 240 MG 24 hr capsule Take 1 capsule (240 mg total) by mouth daily.  90 capsule  3  . famotidine (PEPCID) 20 MG tablet One at bedtime  90 tablet  3  . fenofibrate 160 MG tablet Take 1 tablet (160 mg total) by mouth daily.  30 tablet  0  . glimepiride  (AMARYL) 4 MG tablet Take 1 tablet (4 mg total) by mouth daily before breakfast.  30 tablet  0  . levothyroxine (SYNTHROID, LEVOTHROID) 200 MCG tablet Take 200 mcg by mouth daily.      . metFORMIN (GLUCOPHAGE) 1000 MG tablet Take 1,000 mg by mouth 2 (two) times daily with a meal.      . metoprolol succinate (TOPROL-XL) 25 MG 24 hr tablet Take 1 tablet (25 mg total) by mouth daily. Take one daily  90 tablet  3  . Rivaroxaban (XARELTO) 20 MG TABS tablet Take 1 tablet (20 mg total) by mouth at bedtime.  90 tablet  3  . allopurinol (ZYLOPRIM) 300 MG tablet Take 1 tablet (300 mg total) by mouth daily.  30 tablet  0   No current facility-administered medications for this visit.    Physical Exam: Filed Vitals:   06/13/13 1002  BP: 132/94  Pulse: 89  Height: 6\' 2"  (1.88 m)  Weight: 266 lb 12 oz (120.997 kg)    GEN- The patient is well appearing, alert and oriented x 3 today.   Head- normocephalic, atraumatic Eyes-  Sclera clear, conjunctiva pink Ears- hearing intact Oropharynx- clear Lungs- Clear to ausculation bilaterally, normal work of breathing  Heart- Regular rate and rhythm, no murmurs, rubs or gallops, PMI not laterally displaced GI- soft, NT, ND, + BS Extremities- no clubbing, cyanosis, or edema  ekg today reveals sinus rhythm 90 bpm, incomplete RBBB, LAD, nonspecific St/T changes  Assessment and Plan:  1. afib Doing well s/p ablation off of AAD therapy Stop tikosyn Decrease toprol to 25mg  daily Continue xarelto  Return in 3 months

## 2013-06-14 ENCOUNTER — Ambulatory Visit: Payer: 59 | Admitting: Internal Medicine

## 2013-06-20 ENCOUNTER — Encounter: Payer: Self-pay | Admitting: Internal Medicine

## 2013-06-25 ENCOUNTER — Encounter: Payer: Self-pay | Admitting: Internal Medicine

## 2013-06-25 ENCOUNTER — Ambulatory Visit (INDEPENDENT_AMBULATORY_CARE_PROVIDER_SITE_OTHER): Payer: 59 | Admitting: Internal Medicine

## 2013-06-25 VITALS — BP 134/86 | HR 90 | Temp 98.0°F | Wt 267.0 lb

## 2013-06-25 DIAGNOSIS — R059 Cough, unspecified: Secondary | ICD-10-CM

## 2013-06-25 DIAGNOSIS — E039 Hypothyroidism, unspecified: Secondary | ICD-10-CM

## 2013-06-25 DIAGNOSIS — E785 Hyperlipidemia, unspecified: Secondary | ICD-10-CM

## 2013-06-25 DIAGNOSIS — E119 Type 2 diabetes mellitus without complications: Secondary | ICD-10-CM

## 2013-06-25 DIAGNOSIS — K219 Gastro-esophageal reflux disease without esophagitis: Secondary | ICD-10-CM

## 2013-06-25 DIAGNOSIS — J310 Chronic rhinitis: Secondary | ICD-10-CM

## 2013-06-25 DIAGNOSIS — R05 Cough: Secondary | ICD-10-CM

## 2013-06-25 MED ORDER — AZELASTINE HCL 0.1 % NA SOLN: 2.0000 | Freq: Two times a day (BID) | NASAL | Status: DC

## 2013-06-25 MED ORDER — GLIMEPIRIDE 4 MG PO TABS: 4.0000 mg | ORAL_TABLET | Freq: Every day | ORAL | Status: DC

## 2013-06-25 MED ORDER — ALLOPURINOL 300 MG PO TABS: 300.0000 mg | ORAL_TABLET | Freq: Every day | ORAL | Status: DC

## 2013-06-25 MED ORDER — FENOFIBRATE 160 MG PO TABS: 160.0000 mg | ORAL_TABLET | Freq: Every day | ORAL | Status: DC

## 2013-06-25 MED ORDER — FLUTICASONE PROPIONATE 50 MCG/ACT NA SUSP: 2.0000 | Freq: Every day | NASAL | Status: DC

## 2013-06-25 MED ORDER — AMOXICILLIN-POT CLAVULANATE 875-125 MG PO TABS: 1.0000 | ORAL_TABLET | Freq: Two times a day (BID) | ORAL | Status: DC

## 2013-06-25 MED ORDER — METFORMIN HCL 1000 MG PO TABS: 1000.0000 mg | ORAL_TABLET | Freq: Two times a day (BID) | ORAL | Status: DC

## 2013-06-25 MED ORDER — FAMOTIDINE 20 MG PO TABS: ORAL_TABLET | ORAL | Status: DC

## 2013-06-25 MED ORDER — DEXLANSOPRAZOLE 60 MG PO CPDR: 60.0000 mg | DELAYED_RELEASE_CAPSULE | Freq: Every day | ORAL | Status: DC

## 2013-06-25 MED ORDER — LEVOTHYROXINE SODIUM 200 MCG PO TABS: 200.0000 ug | ORAL_TABLET | Freq: Every day | ORAL | Status: DC

## 2013-06-25 NOTE — Assessment & Plan Note (Signed)
asx , labs

## 2013-06-25 NOTE — Assessment & Plan Note (Signed)
See comments under GERD

## 2013-06-25 NOTE — Assessment & Plan Note (Addendum)
Pulmonary prescribing  aggressively with Dexilant- Pepcid due to chronic cough. Chronic cough actually improved  since 01-2013 and is now exacerbated likely from sinus infection. Plan: Continue with the [place]

## 2013-06-25 NOTE — Patient Instructions (Signed)
Continue Flonase daily and Astelin twice a day Augmentin for 10 days Other medications the same If the cough or sinus congestion are not improving let me know  We need the following labs: FLP ---hyperlipidemia CMP, A1c---  diabetes Uric acid-- gout  TSH--- hypothyroidism  Ask them to fax me the results, to be sure the labs get to me, call my office 1  week after you fax the results.   Next visit 4 months

## 2013-06-25 NOTE — Progress Notes (Signed)
   Subjective:    Patient ID: Colin Wood, male    DOB: [birth year], 53 y.o.   MRN: [id number]  HPI Here for a ROV,we discussed the following issues: 6 weeks history of nasal congestion, postnasal dripping, yellow, nasal discharge. See assessment and plan. Chronic cough--was doing great with PPIs and Pepcid until the sinus symptoms started 6 weeks ago. Diabetes--good medication compliance, not doing well with diet, CBGs were around 135 which is higher than previous readings. Atrial fibrillation--recently saw cardiology, still on xarelto, beta blockers and calcium channel blockers High cholesterol-as good compliance w/ medication Gout -- good compliance of medicines  Past Medical History  Diagnosis Date  . Bipolar disorder     used to see psych, on no meds as of 03/2009  . Rhinitis     vasomotor  . Hypothyroidism   . Hyperlipidemia   . Gout   . GERD (gastroesophageal reflux disease)   . Persistent atrial fibrillation     PVI 12/2012  . Sleep apnea     mild, now treated with CPAP since ~9-12  . Atrial flutter     typical appearing; s/p ablation 12-2012  . Diabetes mellitus     TYPE 2   Past Surgical History  Procedure Laterality Date  . Tonsillectomy    . Vasectomy    . Nasal sinus surgery    . Cardioversion  03/01/11  . Tee without cardioversion N/A 01/04/2013    Procedure: TRANSESOPHAGEAL ECHOCARDIOGRAM (TEE);  Surgeon: [name], MD;  Location: [facility] ENDOSCOPY;  Service: Cardiovascular;  Laterality: N/A;  . Ablation of dysrhythmic focus  01/05/2013    PVI and flutter ablation by [name]     Review of Systems Denies fever or chills. Sneezes very rarely. Denies itchy eyes or itchy nose. No chest pain or difficulty breathing.    Objective:   Physical Exam  BP 134/86  Pulse 90  Temp(Src) 98 F (36.7 C)  Wt 267 lb (121.11 kg)  SpO2 93% General -- alert, well-developed, NAD.  HEENT-- Not pale. TMs normal, throat symmetric, no redness or discharge. Face  symmetric, sinuses not tender to palpation. Nose  congested.  Lungs -- normal respiratory effort, no intercostal retractions, no accessory muscle use, and normal breath sounds.  Heart-- normal rate, regular rhythm, no murmur.   Extremities-- no pretibial edema bilaterally  Neurologic--  alert & oriented X3.  Psych-- Cognition and judgment appear intact. Cooperative with normal attention span and concentration. No anxious or depressed appearing.      Assessment & Plan:

## 2013-06-25 NOTE — Progress Notes (Signed)
Pre visit review using our clinic review tool, if applicable. No additional management support is needed unless otherwise documented below in the visit note. 

## 2013-06-25 NOTE — Assessment & Plan Note (Signed)
LDL was 108 on 01-2013 Cont meds, , labs

## 2013-06-25 NOTE — Assessment & Plan Note (Signed)
Not doing well with diet but plans to go back to a healthier lifestyle. Labs

## 2013-06-25 NOTE — Assessment & Plan Note (Addendum)
See previous entry, + sinusitis per CT 01-2013 Was doing okay (after Augmentin prescribe 01-2013) until 6 weeks ago when he developed symptoms again. Plan: Repeat Augmentin Continue Flonase Add Astelin

## 2013-06-25 NOTE — Assessment & Plan Note (Signed)
Good compliance, labs  

## 2013-06-28 ENCOUNTER — Telehealth: Payer: Self-pay

## 2013-06-28 NOTE — Telephone Encounter (Signed)
Relevant patient education assigned to patient using Emmi. ° °

## 2013-08-03 ENCOUNTER — Ambulatory Visit: Payer: 59 | Admitting: Pulmonary Disease

## 2013-08-28 ENCOUNTER — Telehealth: Payer: Self-pay | Admitting: Internal Medicine

## 2013-08-28 MED ORDER — LEVOTHYROXINE SODIUM 200 MCG PO TABS: 200.0000 ug | ORAL_TABLET | Freq: Every day | ORAL | Status: DC

## 2013-08-28 NOTE — Telephone Encounter (Signed)
Done 30 day sent to local pharmacy. Pt notified

## 2013-08-28 NOTE — Telephone Encounter (Signed)
Patient called requesting rx for levothyroxine 200 mcg. He is calling his mail order pharmacy today, however, he is needing a 30 day supply sent to local pharmacy to pick up now. Pt is using [facility]

## 2013-10-23 ENCOUNTER — Ambulatory Visit: Payer: 59 | Admitting: Internal Medicine

## 2013-11-20 ENCOUNTER — Telehealth: Payer: Self-pay | Admitting: Internal Medicine

## 2013-11-20 NOTE — Telephone Encounter (Signed)
New problem   Pt returning your call. Please call on cell

## 2013-11-20 NOTE — Telephone Encounter (Signed)
Dr.Allred out of office today.Spoke to Janan Halter RN Dr.Allred's nurse.She advised to have patient come to office for a EKG.Appointment scheduled for EKG 11/21/13,will show Dr.Allred 11/21/13.Advised to go to ER if needed.

## 2013-11-20 NOTE — Telephone Encounter (Signed)
New message    Wife calling    Husband in Afib since yesterday around 2 pm .not feeling very well.

## 2013-11-20 NOTE — Telephone Encounter (Signed)
Received call from patient's wife she stated her husband has been in atrial fib for 24 hours.Stated he was told to call Dr.Allred if atrial fib lasted 24 hrs.Stated he feels bad and is at work.Advised will check with Dr.Allred and call her back.

## 2013-11-20 NOTE — Telephone Encounter (Signed)
Returned call to patient he stated his heart went back in rhythm 20 mins ago.Stated he is past due to see Dr.Allred.Dr.Allred had a cancellation for 11/21/13.Appointment scheduled with Dr.Allred 11/21/13 at 12:00.

## 2013-11-21 ENCOUNTER — Ambulatory Visit (INDEPENDENT_AMBULATORY_CARE_PROVIDER_SITE_OTHER): Payer: 59 | Admitting: Internal Medicine

## 2013-11-21 ENCOUNTER — Encounter: Payer: Self-pay | Admitting: Internal Medicine

## 2013-11-21 ENCOUNTER — Other Ambulatory Visit: Payer: Self-pay | Admitting: Internal Medicine

## 2013-11-21 VITALS — BP 137/85 | HR 81 | Ht 74.0 in | Wt 267.0 lb

## 2013-11-21 DIAGNOSIS — E669 Obesity, unspecified: Secondary | ICD-10-CM

## 2013-11-21 DIAGNOSIS — I4891 Unspecified atrial fibrillation: Secondary | ICD-10-CM

## 2013-11-21 DIAGNOSIS — G4733 Obstructive sleep apnea (adult) (pediatric): Secondary | ICD-10-CM

## 2013-11-21 MED ORDER — RIVAROXABAN 20 MG PO TABS: 20.0000 mg | ORAL_TABLET | Freq: Every day | ORAL | Status: DC

## 2013-11-21 MED ORDER — METOPROLOL SUCCINATE ER 25 MG PO TB24: 25.0000 mg | ORAL_TABLET | Freq: Every day | ORAL | Status: DC

## 2013-11-21 MED ORDER — DILTIAZEM HCL ER COATED BEADS 240 MG PO CP24: 240.0000 mg | ORAL_CAPSULE | Freq: Every day | ORAL | Status: DC

## 2013-11-21 NOTE — Patient Instructions (Signed)
Your physician recommends that you schedule a follow-up appointment in: 3 months with Donna Carroll, NP and 6 months with Dr Allred   

## 2013-11-21 NOTE — Progress Notes (Signed)
PCP:  [name], MD  Patient presents today for episode of afib lasting 26 hours. He has since returned to NSR as documented by EKG today. Episode started Monday pm and extended thru Tuesday pm. No obvious trigger. He is being compliant with cardizem and metoprolol for rate control and has very little breakthrough afib since PVI  last August. Has noticed maybe 3-4 episodes a month lasting 30 mins to one hour. Continues on Xarelto for reduction of stroke risk. He has not been as compliant with Cpap as hoped and correlation between poorly treated sleep apnea and recurrent afib was discussed. Other triggers such as obesity and lack of exercise also discussed. Patient seems motivated to manage his risk factors to control afib burden.  Past Medical History  Diagnosis Date  . Bipolar disorder     used to see psych, on no meds as of 03/2009  . Rhinitis     vasomotor  . Hypothyroidism   . Hyperlipidemia   . Gout   . GERD (gastroesophageal reflux disease)   . Persistent atrial fibrillation     PVI 12/2012  . Sleep apnea     mild, now treated with CPAP since ~9-12  . Atrial flutter     typical appearing; s/p ablation 12-2012  . Diabetes mellitus     TYPE 2   Past Surgical History  Procedure Laterality Date  . Tonsillectomy    . Vasectomy    . Nasal sinus surgery    . Cardioversion  03/01/11  . Tee without cardioversion N/A 01/04/2013    Procedure: TRANSESOPHAGEAL ECHOCARDIOGRAM (TEE);  Surgeon: [name], MD;  Location: [place] [facility];  Service: Cardiovascular;  Laterality: N/A;  . Ablation of dysrhythmic focus  01/05/2013    PVI and flutter ablation by [name]    Current Outpatient Prescriptions  Medication Sig Dispense Refill  . allopurinol (ZYLOPRIM) 300 MG tablet Take 1 tablet (300 mg total) by mouth daily.  30 tablet  0  . azelastine (ASTELIN) 137 MCG/SPRAY nasal spray Place 2 sprays into both nostrils 2 (two) times daily.  30 mL  6  . dexlansoprazole (DEXILANT) 60 MG capsule Take  1 capsule (60 mg total) by mouth daily.  90 capsule  2  . diltiazem (CARDIZEM CD) 240 MG 24 hr capsule Take 1 capsule (240 mg total) by mouth daily.  90 capsule  3  . famotidine (PEPCID) 20 MG tablet Take 1 tablet at bedtime      . fenofibrate 160 MG tablet Take 1 tablet (160 mg total) by mouth daily.  90 tablet  2  . fluticasone (FLONASE) 50 MCG/ACT nasal spray Place into both nostrils daily.      [name] glimepiride (AMARYL) 4 MG tablet Take 1 tablet (4 mg total) by mouth daily before breakfast.  90 tablet  2  . levothyroxine (SYNTHROID, LEVOTHROID) 200 MCG tablet Take 1 tablet (200 mcg total) by mouth daily.  30 tablet  0  . metFORMIN (GLUCOPHAGE) 1000 MG tablet Take 1 tablet (1,000 mg total) by mouth 2 (two) times daily with a meal.  90 tablet  2  . metoprolol succinate (TOPROL-XL) 25 MG 24 hr tablet Take 1 tablet (25 mg total) by mouth daily.  90 tablet  3  . rivaroxaban (XARELTO) 20 MG TABS tablet Take 1 tablet (20 mg total) by mouth at bedtime.  90 tablet  3   No current facility-administered medications for this visit.    No Known Allergies  History  Social History  . Marital Status: Married    Spouse Name: N/A    Number of Children: 2  . Years of Education: N/A   Occupational History  . works at [employer] Topics  . Smoking status: Never Smoker   . Smokeless tobacco: Never Used  . Alcohol Use: No  . Drug Use: No  . Sexual Activity: Not on file   Other Topics Concern  . Not on file   Social History Narrative   Pt lives in [place] with spouse and children 6 and 49 y/o.  Works in Engineer, technical sales at [employer].    Family History  Problem Relation Age of Onset  . Diabetes Father   . Hypertension Brother   . Melanoma Brother   . Asthma Mother   . Allergies Daughter   . Cancer Maternal Uncle     throat  . Throat cancer Maternal Uncle   . Colon cancer Paternal Grandfather   . Prostate cancer Neg Hx     ROS-  All systems are reviewed and are negative except  as outlined in the HPI above  Physical Exam: Filed Vitals:   11/21/13 1205  BP: 137/85  Pulse: 81  Height: 6\' 2"  (1.88 m)  Weight: 267 lb (121.11 kg)    GEN- The patient is well appearing, alert and oriented x 3 today.   Head- normocephalic, atraumatic Eyes-  Sclera clear, conjunctiva pink Ears- hearing intact Oropharynx- clear Neck- supple, no JVP Lymph- no cervical lymphadenopathy Lungs- Clear to ausculation bilaterally, normal work of breathing Heart- Regular rate and rhythm, no murmurs, rubs or gallops, PMI not laterally displaced GI- soft, NT, ND, + BS Extremities- no clubbing, cyanosis, or edema MS- no significant deformity or atrophy Skin- no rash or lesion Psych- euthymic mood, full affect Neuro- strength and sensation are intact  Assessment and Plan: 1. Paroxymal AFIb Doing well overall since ablation last August. Patient reassured that afib burden is low and no significant change is needed. Continue with current rate control but can take an extra cardizem or metoprolol if breakthrough occurs lasting longer than pt  usually tolerates.  IF his afib burden increases then we could consider additional AAD or ablation. Repeat echo upon return in 3 months Continue xarelto.  2 Lifestyle risk factors for recurrent afib Weight loss, exercise program and consistent use of CPAP encouraged.  Return to the afib clinic in 3 months.

## 2013-12-11 LAB — HM DIABETES EYE EXAM

## 2013-12-31 ENCOUNTER — Encounter: Payer: Self-pay | Admitting: Internal Medicine

## 2014-01-15 ENCOUNTER — Encounter: Payer: Self-pay | Admitting: Gastroenterology

## 2014-02-14 ENCOUNTER — Other Ambulatory Visit: Payer: Self-pay | Admitting: Internal Medicine

## 2014-02-19 ENCOUNTER — Encounter: Payer: Self-pay | Admitting: Internal Medicine

## 2014-02-20 ENCOUNTER — Other Ambulatory Visit: Payer: Self-pay

## 2014-02-20 MED ORDER — METFORMIN HCL 1000 MG PO TABS: 1000.0000 mg | ORAL_TABLET | Freq: Two times a day (BID) | ORAL | Status: DC

## 2014-02-26 ENCOUNTER — Ambulatory Visit (INDEPENDENT_AMBULATORY_CARE_PROVIDER_SITE_OTHER): Payer: 59 | Admitting: Internal Medicine

## 2014-02-26 ENCOUNTER — Encounter: Payer: Self-pay | Admitting: Internal Medicine

## 2014-02-26 VITALS — BP 138/78 | HR 88 | Temp 98.2°F | Ht 74.0 in | Wt 274.0 lb

## 2014-02-26 DIAGNOSIS — Z23 Encounter for immunization: Secondary | ICD-10-CM

## 2014-02-26 DIAGNOSIS — Z0000 Encounter for general adult medical examination without abnormal findings: Secondary | ICD-10-CM

## 2014-02-26 MED ORDER — LEVOTHYROXINE SODIUM 200 MCG PO TABS: 200.0000 ug | ORAL_TABLET | Freq: Every day | ORAL | Status: DC

## 2014-02-26 MED ORDER — GLIMEPIRIDE 4 MG PO TABS: 4.0000 mg | ORAL_TABLET | Freq: Every day | ORAL | Status: DC

## 2014-02-26 MED ORDER — FENOFIBRATE 160 MG PO TABS: 160.0000 mg | ORAL_TABLET | Freq: Every day | ORAL | Status: DC

## 2014-02-26 MED ORDER — METFORMIN HCL 1000 MG PO TABS: 1000.0000 mg | ORAL_TABLET | Freq: Two times a day (BID) | ORAL | Status: DC

## 2014-02-26 MED ORDER — ALLOPURINOL 300 MG PO TABS: 300.0000 mg | ORAL_TABLET | Freq: Every day | ORAL | Status: DC

## 2014-02-26 NOTE — Patient Instructions (Signed)
Please get the following labs fasting and  fax them to [place] [phone]  CMP, CBC, TSH, A1c, microalbumin, uric acid, FLP --- dx V70  Next visit in 3 months, please make an appointment

## 2014-02-26 NOTE — Progress Notes (Signed)
Subjective:    Patient ID: Colin Wood, male    DOB: Dec 06, 1960, 53 y.o.   MRN: [id number]  DOS:  02/26/2014 Type of visit - description : CPX Interval history: feels well    ROS No  CP, SOB occ palpitations once a month, no lower extremity edema Denies  nausea, vomiting diarrhea, blood in the stools (-) cough, sputum production (-) wheezing, chest congestion  No dysuria, gross hematuria, difficulty urinating   No anxiety, depression     Past Medical History  Diagnosis Date  . Bipolar disorder     used to see psych, on no meds as of 03/2009  . Rhinitis     vasomotor  . Hypothyroidism   . Hyperlipidemia   . Gout   . GERD (gastroesophageal reflux disease)   . Persistent atrial fibrillation     PVI 12/2012  . Sleep apnea     mild, now treated with CPAP since ~9-12  . Atrial flutter     typical appearing; s/p ablation 12-2012  . Diabetes mellitus     TYPE 2    Past Surgical History  Procedure Laterality Date  . Tonsillectomy    . Vasectomy    . Nasal sinus surgery    . Cardioversion  03/01/11  . Tee without cardioversion N/A 01/04/2013    Procedure: TRANSESOPHAGEAL ECHOCARDIOGRAM (TEE);  Surgeon: [name], MD;  Location: [name] ENDOSCOPY;  Service: Cardiovascular;  Laterality: N/A;  . Ablation of dysrhythmic focus  01/05/2013    PVI and flutter ablation by [name]    History   Social History  . Marital Status: Married    Spouse Name: N/A    Number of Children: 2  . Years of Education: N/A   Occupational History  . works at [employer] Topics  . Smoking status: Never Smoker   . Smokeless tobacco: Never Used  . Alcohol Use: No  . Drug Use: No  . Sexual Activity: Not on file   Other Topics Concern  . Not on file   Social History Narrative   Pt lives in [place] with spouse and children 44 and 49 y/o.  Works in Engineer, technical sales at [employer].     Family History  Problem Relation Age of Onset  . Diabetes Father   . Hypertension Brother     . Melanoma Brother   . Asthma Mother   . Allergies Daughter   . Cancer Maternal Uncle     throat  . Throat cancer Maternal Uncle   . Colon cancer Paternal Grandfather   . Prostate cancer Neg Hx        Medication List       This list is accurate as of: 02/26/14  8:22 AM.  Always use your most recent med list.               allopurinol 300 MG tablet  Commonly known as:  ZYLOPRIM  Take 1 tablet (300 mg total) by mouth daily.     azelastine 0.1 % nasal spray  Commonly known as:  ASTELIN  Place 2 sprays into both nostrils 2 (two) times daily.     DEXILANT 60 MG capsule  Generic drug:  dexlansoprazole  Take 1 capsule by mouth  daily     diltiazem 240 MG 24 hr capsule  Commonly known as:  CARDIZEM CD  Take 1 capsule (240 mg total) by mouth daily.     famotidine 20 MG tablet  Commonly known as:  PEPCID  Take 1 tablet by mouth at  bedtime     fenofibrate 160 MG tablet  Take 1 tablet (160 mg total) by mouth daily.     fluticasone 50 MCG/ACT nasal spray  Commonly known as:  FLONASE  Place into both nostrils daily.     glimepiride 4 MG tablet  Commonly known as:  AMARYL  Take 1 tablet (4 mg total) by mouth daily before breakfast.     levothyroxine 200 MCG tablet  Commonly known as:  SYNTHROID, LEVOTHROID  Take 1 tablet (200 mcg total) by mouth daily.     metFORMIN 1000 MG tablet  Commonly known as:  GLUCOPHAGE  Take 1 tablet (1,000 mg total) by mouth 2 (two) times daily with a meal.     metoprolol succinate 25 MG 24 hr tablet  Commonly known as:  TOPROL-XL  Take 1 tablet (25 mg total) by mouth daily.     rivaroxaban 20 MG Tabs tablet  Commonly known as:  XARELTO  Take 1 tablet (20 mg total) by mouth at bedtime.           Objective:   Physical Exam BP 138/78  Pulse 88  Temp(Src) 98.2 F (36.8 C) (Oral)  Ht 6\' 2"  (1.88 m)  Wt 274 lb (124.286 kg)  BMI 35.16 kg/m2  SpO2 96%  General -- alert, well-developed, NAD.  Neck --no thyromegaly , normal  carotid pulse  HEENT-- Not pale.  Lungs -- normal respiratory effort, no intercostal retractions, no accessory muscle use, and normal breath sounds.  Heart-- normal rate, regular rhythm, no murmur.  Abdomen-- Not distended, good bowel sounds,soft, non-tender. Rectal-- + external skin tag. Normal sphincter tone. No rectal masses or tenderness. No stools found   Prostate--Prostate asymetric, slt smaller on the L but more firm. DIABETIC FEET EXAM: No lower extremity edema Normal pedal pulses bilaterally Skin normal, nails normal, no calluses Pinprick examination of the feet normal. Psych-- Cognition and judgment appear intact. Cooperative with normal attention span and concentration. No anxious or depressed appearing.         Assessment & Plan:     Chronic medical problems, they all seem to be stable except diabetes, will check appropriate labs. Reports a A1c  about 2 months ago was 7.7, admits he has not been eating correctly,  he plans to change his diet.  Also having a flareup of back pain, self treated with a stretching, heat and Tylenol, getting slightly better. Will call if not recorded in few days

## 2014-02-26 NOTE — Progress Notes (Signed)
Pre visit review using our clinic review tool, if applicable. No additional management support is needed unless otherwise documented below in the visit note. 

## 2014-02-26 NOTE — Assessment & Plan Note (Addendum)
TD 09  zostavax discussed Flu shot @ work [employer] today  [place] 08-2005: int.  hemorrhoids, next 2017  diet -- planning to join [employer] exercise diiscussed and encourage prostate asymmetric, DRE at baseline compared to the exam i did a year ago , s/p urology eval last year, they were not concern per pt . Per note dx was mild BPH. Likes to do all labs @ Work, further advise w/ results

## 2014-03-05 ENCOUNTER — Ambulatory Visit (HOSPITAL_COMMUNITY)
Admission: RE | Admit: 2014-03-05 | Discharge: 2014-03-05 | Disposition: A | Discharge status: Home / Self Care | Payer: 59 | Source: Ambulatory Visit | Attending: Nurse Practitioner | Admitting: Nurse Practitioner

## 2014-03-05 ENCOUNTER — Telehealth (HOSPITAL_COMMUNITY): Payer: Self-pay | Admitting: Vascular Surgery

## 2014-03-05 ENCOUNTER — Encounter (HOSPITAL_COMMUNITY): Payer: Self-pay | Admitting: Nurse Practitioner

## 2014-03-05 VITALS — BP 136/90 | HR 75 | Wt 276.2 lb

## 2014-03-05 DIAGNOSIS — I480 Paroxysmal atrial fibrillation: Secondary | ICD-10-CM | POA: Diagnosis not present

## 2014-03-05 DIAGNOSIS — Z79899 Other long term (current) drug therapy: Secondary | ICD-10-CM | POA: Insufficient documentation

## 2014-03-05 DIAGNOSIS — Z7901 Long term (current) use of anticoagulants: Secondary | ICD-10-CM | POA: Insufficient documentation

## 2014-03-05 DIAGNOSIS — E039 Hypothyroidism, unspecified: Secondary | ICD-10-CM | POA: Diagnosis not present

## 2014-03-05 DIAGNOSIS — K219 Gastro-esophageal reflux disease without esophagitis: Secondary | ICD-10-CM | POA: Insufficient documentation

## 2014-03-05 DIAGNOSIS — G4730 Sleep apnea, unspecified: Secondary | ICD-10-CM | POA: Diagnosis not present

## 2014-03-05 DIAGNOSIS — I4892 Unspecified atrial flutter: Secondary | ICD-10-CM

## 2014-03-05 DIAGNOSIS — Z09 Encounter for follow-up examination after completed treatment for conditions other than malignant neoplasm: Secondary | ICD-10-CM | POA: Diagnosis not present

## 2014-03-05 DIAGNOSIS — I4891 Unspecified atrial fibrillation: Secondary | ICD-10-CM

## 2014-03-05 DIAGNOSIS — E119 Type 2 diabetes mellitus without complications: Secondary | ICD-10-CM | POA: Diagnosis not present

## 2014-03-05 DIAGNOSIS — Z8249 Family history of ischemic heart disease and other diseases of the circulatory system: Secondary | ICD-10-CM | POA: Insufficient documentation

## 2014-03-05 DIAGNOSIS — E785 Hyperlipidemia, unspecified: Secondary | ICD-10-CM | POA: Diagnosis not present

## 2014-03-05 NOTE — Patient Instructions (Signed)
Your physician recommends that you schedule a follow-up appointment in: 4 months with [name], his office will call you closer to that time to schedule

## 2014-03-05 NOTE — Progress Notes (Signed)
PCP:  [name], MD  Patient presents today for routine EP f/u. Since ablation in 8/14, afib burden has significantly improved but he continues to have break thru afib, usually lasting less than 12 hours. He continues to struggle with his weight, irregular use of cpap, and lack of exercise. Saw his PCP recently as well and heard from him as well that he needed to work on lifestyle  to achieve goals. He does not feel at this time that he needs another ablation or change in medication.  Past Medical History  Diagnosis Date  . Depression     used to see psych, on no meds as of 03/2009  . Rhinitis     vasomotor  . Hypothyroidism   . Hyperlipidemia   . Gout   . GERD (gastroesophageal reflux disease)   . Persistent atrial fibrillation     PVI 12/2012  . Sleep apnea     mild, now treated with CPAP since ~9-12  . Atrial flutter     typical appearing; s/p ablation 12-2012  . Diabetes mellitus     TYPE 2   Past Surgical History  Procedure Laterality Date  . Tonsillectomy    . Vasectomy    . Nasal sinus surgery    . Cardioversion  03/01/11  . Tee without cardioversion N/A 01/04/2013    Procedure: TRANSESOPHAGEAL ECHOCARDIOGRAM (TEE);  Surgeon: [name], MD;  Location: [place];  Service: Cardiovascular;  Laterality: N/A;  . Ablation of dysrhythmic focus  01/05/2013    PVI and flutter ablation by [name]    Current Outpatient Prescriptions  Medication Sig Dispense Refill  . allopurinol (ZYLOPRIM) 300 MG tablet Take 1 tablet (300 mg total) by mouth daily.  90 tablet  2  . azelastine (ASTELIN) 137 MCG/SPRAY nasal spray Place 2 sprays into both nostrils 2 (two) times daily.  30 mL  6  . DEXILANT 60 MG capsule Take 1 capsule by mouth  daily  90 capsule  0  . diltiazem (CARDIZEM CD) 240 MG 24 hr capsule Take 1 capsule (240 mg total) by mouth daily.  90 capsule  1  . famotidine (PEPCID) 20 MG tablet Take 1 tablet by mouth at  bedtime  90 tablet  0  . fenofibrate 160 MG tablet Take 1  tablet (160 mg total) by mouth daily.  90 tablet  2  . fluticasone (FLONASE) 50 MCG/ACT nasal spray Place into both nostrils daily.      [name] glimepiride (AMARYL) 4 MG tablet Take 1 tablet (4 mg total) by mouth daily before breakfast.  90 tablet  2  . levothyroxine (SYNTHROID, LEVOTHROID) 200 MCG tablet Take 1 tablet (200 mcg total) by mouth daily.  90 tablet  2  . metFORMIN (GLUCOPHAGE) 1000 MG tablet Take 1 tablet (1,000 mg total) by mouth 2 (two) times daily with a meal.  180 tablet  2  . metoprolol succinate (TOPROL-XL) 25 MG 24 hr tablet Take 1 tablet (25 mg total) by mouth daily.  90 tablet  3  . rivaroxaban (XARELTO) 20 MG TABS tablet Take 1 tablet (20 mg total) by mouth at bedtime.  90 tablet  3   No current facility-administered medications for this encounter.    No Known Allergies  History   Social History  . Marital Status: Married    Spouse Name: N/A    Number of Children: 2  . Years of Education: N/A   Occupational History  . works at [employer]  Social History Main Topics  . Smoking status: Never Smoker   . Smokeless tobacco: Never Used  . Alcohol Use: No  . Drug Use: No  . Sexual Activity: Not on file   Other Topics Concern  . Not on file   Social History Narrative   Pt lives in [place] with spouse and children 90 and 27 y/o.  Works in Engineer, technical sales at [employer].    Family History  Problem Relation Age of Onset  . Diabetes Father   . Hypertension Brother   . Melanoma Brother   . Asthma Mother   . Allergies Daughter   . Cancer Maternal Uncle     throat  . Throat cancer Maternal Uncle   . Colon cancer Paternal Grandfather   . Prostate cancer Neg Hx     ROS-  All systems are reviewed and are negative except as outlined in the HPI above  Physical Exam: Filed Vitals:   03/05/14 1625  BP: 136/90  Pulse: 75  Weight: 276 lb 4 oz (125.306 kg)  SpO2: 98%    GEN- The patient is well appearing, alert and oriented x 3 today.   Head- normocephalic,  atraumatic Eyes-  Sclera clear, conjunctiva pink Ears- hearing intact Oropharynx- clear Neck- supple, no JVP Lymph- no cervical lymphadenopathy Lungs- Clear to ausculation bilaterally, normal work of breathing Heart- Regular rate and rhythm, no murmurs, rubs or gallops, PMI not laterally displaced GI- soft, NT, ND, + BS Extremities- no clubbing, cyanosis, or edema MS- no significant deformity or atrophy Skin- no rash or lesion Psych- euthymic mood, full affect Neuro- strength and sensation are intact  Assessment and Plan: 1. Paroxymal AFIb Doing well overall since ablation last August. Patient feels that his afib burden is still manageable with taking an extra metoprolol or Cardizem and is not willing to change meds or consider another ablation. Taking Cardizem in am and metoprolol in pm, instead of both in the am may give better coverage of drug. Continue xarelto.  2 Lifestyle risk factors for recurrent afib Weight loss, exercise program and consistent use of CPAP encouraged. Encouraged to attend the free dietary class offered  as [facility] as part of afib clinic.  Return to   clinic in 4 months with [name], sooner if higher afib burden.

## 2014-03-05 NOTE — Telephone Encounter (Signed)
Open in error

## 2014-03-19 ENCOUNTER — Telehealth: Payer: Self-pay | Admitting: Internal Medicine

## 2014-03-19 MED ORDER — LINAGLIPTIN 5 MG PO TABS: 5.0000 mg | ORAL_TABLET | Freq: Every day | ORAL | Status: DC

## 2014-03-19 NOTE — Telephone Encounter (Signed)
Labs from 03/14/2014: CBC normal Creatinine 1.1. Potassium 4.2. AST 32, ALT 59 slightly elevated. Total cholesterol 270, triglycerides 1094 A1c 8.1. Uric acid 5.8. Advise patient: Triglycerides and  blood sugar are significantly higher than before. We definitely need to do something to correct his situation. In addition to eat healthier and increase physical activity I recommend to add Tradjenta 5 mg 1 po qam, #30, 5 RF Office visit in 3 months

## 2014-03-19 NOTE — Telephone Encounter (Signed)
Spoke with Pt and informed him of lab results. Instructed him to begin Tradjenta 5 mg 1 tablet daily. Medication sent to [facility] per Pt request. Informed Pt to return to office in 3 months for F/U, Pt stated he would call back to make appt.

## 2014-03-21 ENCOUNTER — Other Ambulatory Visit: Payer: Self-pay

## 2014-03-21 MED ORDER — METFORMIN HCL 1000 MG PO TABS: 1000.0000 mg | ORAL_TABLET | Freq: Two times a day (BID) | ORAL | Status: DC

## 2014-03-27 ENCOUNTER — Encounter: Payer: Self-pay | Admitting: Internal Medicine

## 2014-05-06 ENCOUNTER — Ambulatory Visit (INDEPENDENT_AMBULATORY_CARE_PROVIDER_SITE_OTHER): Payer: 59 | Admitting: Medical

## 2014-05-06 ENCOUNTER — Encounter: Payer: Self-pay | Admitting: Medical

## 2014-05-06 VITALS — BP 138/80 | HR 83 | Temp 98.5°F | Ht 73.75 in | Wt 272.0 lb

## 2014-05-06 DIAGNOSIS — J0100 Acute maxillary sinusitis, unspecified: Secondary | ICD-10-CM

## 2014-05-06 MED ORDER — CEFDINIR 300 MG PO CAPS: 300.0000 mg | ORAL_CAPSULE | Freq: Two times a day (BID) | ORAL | Status: DC

## 2014-05-06 MED ORDER — BENZONATATE 100 MG PO CAPS: 100.0000 mg | ORAL_CAPSULE | Freq: Three times a day (TID) | ORAL | Status: DC | PRN

## 2014-05-06 NOTE — Progress Notes (Signed)
Subjective:    Patient ID: Loel Lofty, male    DOB: 1960-08-20, 53 y.o.   MRN: [id number]  HPI   Pt in today reporting cough, nasal congestion, sinus pressure  and runny nose for x 7  Days.  Frontal and sinus pressure. Hx of sinus surgery 5-7 yrs ago. So was worst in the past.    Pt states he does not sleep well for 3 years and he thinks that he never sleeps well. Sleeps 4 hours then will awake. Then he has a hard time falling back asleep. Never slept well since using the cpap.  Associated symptoms( below yes or no)  Fever-no Chills-no Chest congestion-no Sneezing- yes Itching eyes-yes a little Sore throat-  Post-nasal drainage-yes Wheezing-no Purulent nasal drainage-yes Fatigue-mild.  Past Medical History  Diagnosis Date  . Depression     used to see psych, on no meds as of 03/2009  . Rhinitis     vasomotor  . Hypothyroidism   . Hyperlipidemia   . Gout   . GERD (gastroesophageal reflux disease)   . Persistent atrial fibrillation     PVI 12/2012  . Sleep apnea     mild, now treated with CPAP since ~9-12  . Atrial flutter     typical appearing; s/p ablation 12-2012  . Diabetes mellitus     TYPE 2    History   Social History  . Marital Status: Married    Spouse Name: N/A    Number of Children: 2  . Years of Education: N/A   Occupational History  . works at [employer] Topics  . Smoking status: Never Smoker   . Smokeless tobacco: Never Used  . Alcohol Use: No  . Drug Use: No  . Sexual Activity: Not on file   Other Topics Concern  . Not on file   Social History Narrative   Pt lives in [place] with spouse and children 74 and 61 y/o.  Works in Engineer, technical sales at [employer].    Past Surgical History  Procedure Laterality Date  . Tonsillectomy    . Vasectomy    . Nasal sinus surgery    . Cardioversion  03/01/11  . Tee without cardioversion N/A 01/04/2013    Procedure: TRANSESOPHAGEAL ECHOCARDIOGRAM (TEE);  Surgeon: [name], MD;   Location: [facility] ENDOSCOPY;  Service: Cardiovascular;  Laterality: N/A;  . Ablation of dysrhythmic focus  01/05/2013    PVI and flutter ablation by [name]    Family History  Problem Relation Age of Onset  . Diabetes Father   . Hypertension Brother   . Melanoma Brother   . Asthma Mother   . Allergies Daughter   . Cancer Maternal Uncle     throat  . Throat cancer Maternal Uncle   . Colon cancer Paternal Grandfather   . Prostate cancer Neg Hx     No Known Allergies  Current Outpatient Prescriptions on File Prior to Visit  Medication Sig Dispense Refill  . allopurinol (ZYLOPRIM) 300 MG tablet Take 1 tablet (300 mg total) by mouth daily. 90 tablet 2  . azelastine (ASTELIN) 137 MCG/SPRAY nasal spray Place 2 sprays into both nostrils 2 (two) times daily. 30 mL 6  . DEXILANT 60 MG capsule Take 1 capsule by mouth  daily 90 capsule 0  . diltiazem (CARDIZEM CD) 240 MG 24 hr capsule Take 1 capsule (240 mg total) by mouth daily. 90 capsule 1  . famotidine (PEPCID) 20 MG tablet  Take 1 tablet by mouth at  bedtime 90 tablet 0  . fenofibrate 160 MG tablet Take 1 tablet (160 mg total) by mouth daily. 90 tablet 2  . fluticasone (FLONASE) 50 MCG/ACT nasal spray Place into both nostrils daily.    [name] glimepiride (AMARYL) 4 MG tablet Take 1 tablet (4 mg total) by mouth daily before breakfast. 90 tablet 2  . levothyroxine (SYNTHROID, LEVOTHROID) 200 MCG tablet Take 1 tablet (200 mcg total) by mouth daily. 90 tablet 2  . linagliptin (TRADJENTA) 5 MG TABS tablet Take 1 tablet (5 mg total) by mouth daily. 30 tablet 3  . metFORMIN (GLUCOPHAGE) 1000 MG tablet Take 1 tablet (1,000 mg total) by mouth 2 (two) times daily with a meal. 180 tablet 2  . metoprolol succinate (TOPROL-XL) 25 MG 24 hr tablet Take 1 tablet (25 mg total) by mouth daily. 90 tablet 3  . rivaroxaban (XARELTO) 20 MG TABS tablet Take 1 tablet (20 mg total) by mouth at bedtime. 90 tablet 3   No current facility-administered medications on file  prior to visit.    BP 138/80 mmHg  Pulse 83  Temp(Src) 98.5 F (36.9 C) (Oral)  Ht 6' 1.75" (1.873 m)  Wt 272 lb (123.378 kg)  BMI 35.17 kg/m2  SpO2 96%       Review of Systems  Constitutional: Positive for fatigue. Negative for fever and chills.  HENT: Positive for congestion, postnasal drip, sinus pressure, sneezing and sore throat.   Respiratory: Positive for cough. Negative for shortness of breath and wheezing.   Cardiovascular: Negative for chest pain and palpitations.  Musculoskeletal: Negative for back pain and neck pain.  Neurological: Negative for dizziness and headaches.  Hematological: Negative for adenopathy. Does not bruise/bleed easily.       Objective:   Physical Exam   General  Mental Status - Alert. General Appearance - Well groomed. Not in acute distress.  Skin Rashes- No Rashes.  HEENT Head- Normal. Ear Auditory Canal - Left- Normal. Right - Normal.Tympanic Membrane- Left- Normal. Right- Normal. Eye Sclera/Conjunctiva- Left- Normal. Right- Normal. Nose & Sinuses Nasal Mucosa- Left-  Boggy and Congested. Right-  Boggy and  Congested.Bilateral maxillary and frontal sinus pressure. Mouth & Throat Lips: Upper Lip- Normal: no dryness, cracking, pallor, cyanosis, or vesicular eruption. Lower Lip-Normal: no dryness, cracking, pallor, cyanosis or vesicular eruption. Buccal Mucosa- Bilateral- No Aphthous ulcers. Oropharynx- No Discharge or Erythema. Tonsils: Characteristics- Bilateral- No Erythema or Congestion. Size/Enlargement- Bilateral- No enlargement. Discharge- bilateral-None.  Neck Neck- Supple. No Masses.   Chest and Lung Exam Auscultation: Breath Sounds:-Clear even and unlabored.  Cardiovascular Auscultation:Rythm- Regular, rate and rhythm. Murmurs & Other Heart Sounds:Ausculatation of the heart reveal- No Murmurs.  Lymphatic Head & Neck General Head & Neck Lymphatics: Bilateral: Description- No Localized  lymphadenopathy.          Assessment & Plan:

## 2014-05-06 NOTE — Patient Instructions (Signed)
Your appear to have a sinus infection. I am prescribing  Cefdinir antibiotic for the infection. To help with the nasal congestion I want you to take your nasal sprays  For your associated cough, I prescribed cough medicine benzonatate.  I do recommend you see your sleep specialist. You will make that appointment. If you have any problems scheduling that please let us know.  Rest, hydrate, tylenol for fever.  Follow up in 7 days or as needed.

## 2014-05-06 NOTE — Assessment & Plan Note (Signed)
Your appear to have a sinus infection. I am prescribing  Cefdinir antibiotic for the infection. To help with the nasal congestion I want you to take your nasal sprays  For your associated cough, I prescribed cough medicine benzonatate.

## 2014-05-06 NOTE — Progress Notes (Signed)
Pre visit review using our clinic review tool, if applicable. No additional management support is needed unless otherwise documented below in the visit note. 

## 2014-05-07 ENCOUNTER — Other Ambulatory Visit: Payer: Self-pay | Admitting: Internal Medicine

## 2014-05-09 ENCOUNTER — Encounter (HOSPITAL_COMMUNITY): Payer: Self-pay | Admitting: Internal Medicine

## 2014-06-03 ENCOUNTER — Ambulatory Visit (HOSPITAL_BASED_OUTPATIENT_CLINIC_OR_DEPARTMENT_OTHER)
Admission: RE | Admit: 2014-06-03 | Discharge: 2014-06-03 | Disposition: A | Discharge status: Home / Self Care | Payer: 59 | Source: Ambulatory Visit | Attending: Internal Medicine | Admitting: Internal Medicine

## 2014-06-03 ENCOUNTER — Encounter: Payer: Self-pay | Admitting: Internal Medicine

## 2014-06-03 ENCOUNTER — Ambulatory Visit (INDEPENDENT_AMBULATORY_CARE_PROVIDER_SITE_OTHER): Payer: 59 | Admitting: Internal Medicine

## 2014-06-03 VITALS — BP 152/78 | HR 107 | Temp 98.8°F | Wt 265.4 lb

## 2014-06-03 DIAGNOSIS — R05 Cough: Secondary | ICD-10-CM

## 2014-06-03 DIAGNOSIS — J40 Bronchitis, not specified as acute or chronic: Secondary | ICD-10-CM | POA: Diagnosis not present

## 2014-06-03 DIAGNOSIS — R059 Cough, unspecified: Secondary | ICD-10-CM

## 2014-06-03 MED ORDER — AZITHROMYCIN 250 MG PO TABS: ORAL_TABLET | ORAL | Status: DC

## 2014-06-03 MED ORDER — HYDROCODONE-HOMATROPINE 5-1.5 MG/5ML PO SYRP: 5.0000 mL | ORAL_SOLUTION | Freq: Three times a day (TID) | ORAL | Status: DC | PRN

## 2014-06-03 NOTE — Progress Notes (Signed)
Pre visit review using our clinic review tool, if applicable. No additional management support is needed unless otherwise documented below in the visit note. 

## 2014-06-03 NOTE — Progress Notes (Signed)
Subjective:    Patient ID: Colin Wood, male    DOB: [birth year], 54 y.o.   MRN: [id number]  DOS:  06/03/2014 Type of visit - description : acute Interval history: Was seen 05-16-2014, Rx abx, got better x few days but now cough is back + sinus congestion , not improved Unable to sleep d/t episodes of cough + PND   ROS Low grade Temp? No wheezing, hemopptysis, + sputum production, mild  No nausea-vomiting   Past Medical History  Diagnosis Date  . Depression     used to see psych, on no meds as of 03/2009  . Rhinitis     vasomotor  . Hypothyroidism   . Hyperlipidemia   . Gout   . GERD (gastroesophageal reflux disease)   . Persistent atrial fibrillation     PVI 12/2012  . Sleep apnea     mild, now treated with CPAP since ~9-12  . Atrial flutter     typical appearing; s/p ablation 12-2012  . Diabetes mellitus     TYPE 2    Past Surgical History  Procedure Laterality Date  . Tonsillectomy    . Vasectomy    . Nasal sinus surgery    . Cardioversion  03/01/11  . Tee without cardioversion N/A 01/04/2013    Procedure: TRANSESOPHAGEAL ECHOCARDIOGRAM (TEE);  Surgeon: [name], MD;  [facility] ENDOSCOPY;  Service: Cardiovascular;  Laterality: N/A;  . Ablation of dysrhythmic focus  01/05/2013    PVI and flutter ablation by [name]  . Atrial fibrillation ablation N/A 01/05/2013    Procedure: ATRIAL FIBRILLATION ABLATION;  Surgeon: Stauch Grayer, MD;  Location: [facility] CATH LAB;  Service: Cardiovascular;  Laterality: N/A;    History   Social History  . Marital Status: Married    Spouse Name: N/A    Number of Children: 2  . Years of Education: N/A   Occupational History  . works at [employer] Topics  . Smoking status: Never Smoker   . Smokeless tobacco: Never Used  . Alcohol Use: No  . Drug Use: No  . Sexual Activity: Not on file   Other Topics Concern  . Not on file   Social History Narrative   Pt lives in [place] with spouse and  children 59 and 57 y/o.  Works in Engineer, technical sales at [employer].        Medication List       This list is accurate as of: 06/03/14 11:59 PM.  Always use your most recent med list.               allopurinol 300 MG tablet  Commonly known as:  ZYLOPRIM  Take 1 tablet (300 mg total) by mouth daily.     azelastine 0.1 % nasal spray  Commonly known as:  ASTELIN  Place 2 sprays into both nostrils 2 (two) times daily.     azithromycin 250 MG tablet  Commonly known as:  ZITHROMAX Z-PAK  2 tabs a day the first day, then 1 tab a day x 4 days     benzonatate 100 MG capsule  Commonly known as:  TESSALON  Take 1 capsule (100 mg total) by mouth 3 (three) times daily as needed for cough.     DEXILANT 60 MG capsule  Generic drug:  dexlansoprazole  Take 1 capsule by mouth  daily     diltiazem 240 MG 24 hr capsule  Commonly known as:  CARDIZEM CD  Take 1 capsule (240 mg total) by mouth daily.     famotidine 20 MG tablet  Commonly known as:  PEPCID  Take 1 tablet by mouth at  bedtime     fenofibrate 160 MG tablet  Take 1 tablet (160 mg total) by mouth daily.     fluticasone 50 MCG/ACT nasal spray  Commonly known as:  FLONASE  Place into both nostrils daily.     glimepiride 4 MG tablet  Commonly known as:  AMARYL  Take 1 tablet (4 mg total) by mouth daily before breakfast.     HYDROcodone-homatropine 5-1.5 MG/5ML syrup  Commonly known as:  HYCODAN  Take 5 mLs by mouth 3 (three) times daily as needed for cough.     levothyroxine 200 MCG tablet  Commonly known as:  SYNTHROID, LEVOTHROID  Take 1 tablet (200 mcg total) by mouth daily.     linagliptin 5 MG Tabs tablet  Commonly known as:  TRADJENTA  Take 1 tablet daily. DUE FOR APPT WITH DR Larose Kells IN January. [phone].     metFORMIN 1000 MG tablet  Commonly known as:  GLUCOPHAGE  Take 1 tablet (1,000 mg total) by mouth 2 (two) times daily with a meal.     metoprolol succinate 25 MG 24 hr tablet  Commonly known as:  TOPROL-XL  Take 1 tablet  (25 mg total) by mouth daily.     rivaroxaban 20 MG Tabs tablet  Commonly known as:  XARELTO  Take 1 tablet (20 mg total) by mouth at bedtime.           Objective:   Physical Exam BP 152/78 mmHg  Pulse 107  Temp(Src) 98.8 F (37.1 C) (Oral)  Wt 265 lb 6 oz (120.373 kg)  SpO2 93%  General -- alert, well-developed, NAD except for persistent cough.  HEENT-- Not pale.  R Ear-- normal L ear-- normal Throat symmetric, no redness or discharge. Face symmetric, sinuses not tender to palpation. Nose slt  Congested. Lungs -- normal respiratory effort, no intercostal retractions, no accessory muscle use, and normal breath sounds.  Heart-- HR 110, ?regular, no murmur.  Extremities-- no pretibial edema bilaterally  Neurologic--  alert & oriented X3. Speech normal, gait appropriate for age, strength symmetric and appropriate for age.   Psych-- Cognition and judgment appear intact. Cooperative with normal attention span and concentration. No anxious or depressed appearing.       Assessment & Plan:   Persistent cough, Atypical bronchitis, will check a chest x-ray, otherwise see instructions  History of A. fib, anticoagulated, heart rate is slightly high today, the patient or than the URI symptoms feels well.

## 2014-06-03 NOTE — Patient Instructions (Signed)
Get a XR at the first floor  Rest, fluids , tylenol If  cough, take Mucinex DM twice a day as needed  Hydrocodone if the cough persists, will cause drowsiness Continue nasal sprays  Take the antibiotic as prescribed  (zpack) Call if not gradually better over the next  10 days  Call anytime if the symptoms are severe

## 2014-06-04 ENCOUNTER — Telehealth: Payer: Self-pay

## 2014-06-04 NOTE — Telephone Encounter (Signed)
Colin Wood  [phone]  Colin Wood called to get the results of his chest xrays.

## 2014-06-04 NOTE — Telephone Encounter (Signed)
Results released to MyChart today. Chest x-ray normal, no pneumonia.

## 2014-06-04 NOTE — Telephone Encounter (Signed)
Called pt to let him know that results are on My Chart

## 2014-07-10 ENCOUNTER — Encounter: Payer: Self-pay | Admitting: Internal Medicine

## 2014-07-10 ENCOUNTER — Ambulatory Visit (INDEPENDENT_AMBULATORY_CARE_PROVIDER_SITE_OTHER): Payer: 59 | Admitting: Internal Medicine

## 2014-07-10 VITALS — BP 135/82 | HR 80 | Temp 98.1°F | Resp 18 | Wt 264.8 lb

## 2014-07-10 DIAGNOSIS — E119 Type 2 diabetes mellitus without complications: Secondary | ICD-10-CM

## 2014-07-10 DIAGNOSIS — R05 Cough: Secondary | ICD-10-CM

## 2014-07-10 DIAGNOSIS — M109 Gout, unspecified: Secondary | ICD-10-CM

## 2014-07-10 DIAGNOSIS — E785 Hyperlipidemia, unspecified: Secondary | ICD-10-CM

## 2014-07-10 DIAGNOSIS — J0100 Acute maxillary sinusitis, unspecified: Secondary | ICD-10-CM

## 2014-07-10 DIAGNOSIS — R059 Cough, unspecified: Secondary | ICD-10-CM

## 2014-07-10 DIAGNOSIS — I4891 Unspecified atrial fibrillation: Secondary | ICD-10-CM

## 2014-07-10 MED ORDER — GLIMEPIRIDE 4 MG PO TABS: 4.0000 mg | ORAL_TABLET | Freq: Every day | ORAL | Status: DC

## 2014-07-10 MED ORDER — ALLOPURINOL 300 MG PO TABS: 300.0000 mg | ORAL_TABLET | Freq: Every day | ORAL | Status: DC

## 2014-07-10 MED ORDER — METFORMIN HCL 1000 MG PO TABS: 1000.0000 mg | ORAL_TABLET | Freq: Two times a day (BID) | ORAL | Status: DC

## 2014-07-10 MED ORDER — DEXLANSOPRAZOLE 60 MG PO CPDR: DELAYED_RELEASE_CAPSULE | ORAL | Status: DC

## 2014-07-10 MED ORDER — FENOFIBRATE 160 MG PO TABS: 160.0000 mg | ORAL_TABLET | Freq: Every day | ORAL | Status: DC

## 2014-07-10 MED ORDER — FAMOTIDINE 20 MG PO TABS: ORAL_TABLET | ORAL | Status: DC

## 2014-07-10 MED ORDER — HYDROCODONE-HOMATROPINE 5-1.5 MG/5ML PO SYRP: 5.0000 mL | ORAL_SOLUTION | Freq: Three times a day (TID) | ORAL | Status: DC | PRN

## 2014-07-10 MED ORDER — LINAGLIPTIN 5 MG PO TABS: ORAL_TABLET | ORAL | Status: DC

## 2014-07-10 MED ORDER — LEVOTHYROXINE SODIUM 200 MCG PO TABS: 200.0000 ug | ORAL_TABLET | Freq: Every day | ORAL | Status: DC

## 2014-07-10 NOTE — Assessment & Plan Note (Signed)
Overdue to see cardiology, will arrange a referral

## 2014-07-10 NOTE — Patient Instructions (Addendum)
Please get the following labs: CMP, FLP, A1c, TSH, uric acid DX diabetes, gout, high blood pressure Fax the results to our office: [phone]  Next visit in 3 months Obtain the same labs 1 week prior to your next visit

## 2014-07-10 NOTE — Progress Notes (Signed)
Pre visit review using our clinic review tool, if applicable. No additional management support is needed unless otherwise documented below in the visit note. 

## 2014-07-10 NOTE — Assessment & Plan Note (Signed)
Last cholesterol panel show a very high triglyceride, already on fenofibrate, should improve with diet, exercise and better diabetes control

## 2014-07-10 NOTE — Assessment & Plan Note (Signed)
Symptoms resolve but has minimal residual cough. Would like a prescription for hydrocodone cough medicine, prescription provided

## 2014-07-10 NOTE — Assessment & Plan Note (Addendum)
Uncontrolled diabetes, the concept of diabetes legacy, risk of future strokes, heart attacks, PVD, kidney failure discussed with the patient. It is extremely important he change his lifestyle (in addition to medication). I asked him if he needs more information about diet and he said no, plans to do Weight Watchers and already joined the [facility]. Consider endocrinology if diabetes is not well-controlled ?, patient states he will be somewhat reluctant, plans to really change his lifestyle

## 2014-07-10 NOTE — Assessment & Plan Note (Signed)
Mild residual cough, prescription for hydrocodone provided

## 2014-07-10 NOTE — Progress Notes (Signed)
Subjective:    Patient ID: Colin Wood, male    DOB: Jul 23, 1960, 54 y.o.   MRN: [id number]  DOS:  07/10/2014 Type of visit - description : rov Interval history: Diabetes, based on the last A1c, Tradjenta was rx. Has not seen any improvement on his CBGs, admits to poor diet and not much exercise Hypothyroidism, good compliance with medications. Dyslipidemia, last triglycerides were quite elevated, he is already taking fenofibrate.   Review of Systems Denies chest pain, difficulty breathing No nausea, vomiting, diarrhea  Past Medical History  Diagnosis Date  . Depression     used to see psych, on no meds as of 03/2009  . Rhinitis     vasomotor  . Hypothyroidism   . Hyperlipidemia   . Gout   . GERD (gastroesophageal reflux disease)   . Persistent atrial fibrillation     PVI 12/2012  . Sleep apnea     mild, now treated with CPAP since ~9-12  . Atrial flutter     typical appearing; s/p ablation 12-2012  . Diabetes mellitus     TYPE 2    Past Surgical History  Procedure Laterality Date  . Tonsillectomy    . Vasectomy    . Nasal sinus surgery    . Cardioversion  03/01/11  . Tee without cardioversion N/A 01/04/2013    Procedure: TRANSESOPHAGEAL ECHOCARDIOGRAM (TEE);  Surgeon: [name], MD;  Location: [facility] ENDOSCOPY;  Service: Cardiovascular;  Laterality: N/A;  . Ablation of dysrhythmic focus  01/05/2013    PVI and flutter ablation by [name]  . Atrial fibrillation ablation N/A 01/05/2013    Procedure: ATRIAL FIBRILLATION ABLATION;  Surgeon: Shupert Grayer, MD;  Location: [facility] CATH LAB;  Service: Cardiovascular;  Laterality: N/A;    History   Social History  . Marital Status: Married    Spouse Name: N/A  . Number of Children: 2  . Years of Education: N/A   Occupational History  . works at [employer] Topics  . Smoking status: Never Smoker   . Smokeless tobacco: Never Used  . Alcohol Use: No  . Drug Use: No  . Sexual Activity: Not on file    Other Topics Concern  . Not on file   Social History Narrative   Pt lives in [place] with spouse and children 39 and 39 y/o.  Works in Engineer, technical sales at [employer].        Medication List       This list is accurate as of: 07/10/14 11:59 PM.  Always use your most recent med list.               allopurinol 300 MG tablet  Commonly known as:  ZYLOPRIM  Take 1 tablet (300 mg total) by mouth daily.     azelastine 0.1 % nasal spray  Commonly known as:  ASTELIN  Place 2 sprays into both nostrils 2 (two) times daily.     dexlansoprazole 60 MG capsule  Commonly known as:  DEXILANT  Take 1 capsule by mouth  daily     diltiazem 240 MG 24 hr capsule  Commonly known as:  CARDIZEM CD  Take 1 capsule (240 mg total) by mouth daily.     famotidine 20 MG tablet  Commonly known as:  PEPCID  Take 1 tablet by mouth at  bedtime     fenofibrate 160 MG tablet  Take 1 tablet (160 mg total) by mouth daily.  fluticasone 50 MCG/ACT nasal spray  Commonly known as:  FLONASE  Place into both nostrils daily.     glimepiride 4 MG tablet  Commonly known as:  AMARYL  Take 1 tablet (4 mg total) by mouth daily before breakfast.     HYDROcodone-homatropine 5-1.5 MG/5ML syrup  Commonly known as:  HYCODAN  Take 5 mLs by mouth 3 (three) times daily as needed for cough.     levothyroxine 200 MCG tablet  Commonly known as:  SYNTHROID, LEVOTHROID  Take 1 tablet (200 mcg total) by mouth daily.     linagliptin 5 MG Tabs tablet  Commonly known as:  TRADJENTA  Take 1 tablet daily.     metFORMIN 1000 MG tablet  Commonly known as:  GLUCOPHAGE  Take 1 tablet (1,000 mg total) by mouth 2 (two) times daily with a meal.     metoprolol succinate 25 MG 24 hr tablet  Commonly known as:  TOPROL-XL  Take 1 tablet (25 mg total) by mouth daily.     rivaroxaban 20 MG Tabs tablet  Commonly known as:  XARELTO  Take 1 tablet (20 mg total) by mouth at bedtime.           Objective:   Physical Exam   Constitutional: He is oriented to person, place, and time. He appears well-developed. No distress.  HENT:  Head: Normocephalic and atraumatic.  Cardiovascular:  Seems regular , no murmur, rub or gallop  Pulmonary/Chest: Effort normal. No respiratory distress.  CTA B  Musculoskeletal: Normal range of motion. He exhibits no edema or tenderness.  Neurological: He is alert and oriented to person, place, and time. No cranial nerve deficit. He exhibits normal muscle tone. Coordination normal.  Speech normal, gait unassisted and normal for age, motor strength appropriate for age   Skin: Skin is warm and dry. No pallor.  Psychiatric: He has a normal mood and affect. His behavior is normal. Judgment and thought content normal.  Vitals reviewed.        Assessment & Plan:   Problem List Items Addressed This Visit      Respiratory   Sinusitis, acute maxillary    Symptoms resolve but has minimal residual cough. Would like a prescription for hydrocodone cough medicine, prescription provided      Relevant Medications   HYDROcodone-homatropine (HYCODAN) 5-1.5 MG/5ML syrup     Endocrine   DMII (diabetes mellitus, type 2)    Uncontrolled diabetes, the concept of diabetes legacy, risk of future strokes, heart attacks, PVD, kidney failure discussed with the patient. It is extremely important he change his lifestyle (in addition to medication). I asked him if he needs more information about diet and he said no, plans to do Weight Watchers and already joined the [facility]. Consider endocrinology a diabetes is not well-controlled ?, patient states he will be somewhat reluctant, plans to really change his lifestyle      Relevant Medications   metFORMIN (GLUCOPHAGE) tablet   glimepiride (AMARYL) tablet   linagliptin (TRADJENTA) tablet     Other   Hyperlipidemia    Last cholesterol panel show a very high triglyceride, already on fenofibrate, should improve with diet, exercise and better diabetes  control      Relevant Medications   fenofibrate tablet   Gout    Asymptomatic, labs, refill meds      Cough    Mild residual cough, prescription for hydrocodone provided

## 2014-07-10 NOTE — Assessment & Plan Note (Signed)
Asymptomatic, labs, refill meds

## 2014-07-18 ENCOUNTER — Telehealth: Payer: Self-pay | Admitting: Internal Medicine

## 2014-07-18 ENCOUNTER — Encounter: Payer: Self-pay | Admitting: Nurse Practitioner

## 2014-07-18 ENCOUNTER — Ambulatory Visit (INDEPENDENT_AMBULATORY_CARE_PROVIDER_SITE_OTHER): Payer: 59 | Admitting: Nurse Practitioner

## 2014-07-18 VITALS — BP 120/88 | HR 142 | Ht 74.0 in | Wt 263.8 lb

## 2014-07-18 DIAGNOSIS — I4891 Unspecified atrial fibrillation: Secondary | ICD-10-CM

## 2014-07-18 DIAGNOSIS — I4819 Other persistent atrial fibrillation: Secondary | ICD-10-CM

## 2014-07-18 DIAGNOSIS — I481 Persistent atrial fibrillation: Secondary | ICD-10-CM

## 2014-07-18 MED ORDER — DILTIAZEM HCL ER COATED BEADS 240 MG PO CP24: 240.0000 mg | ORAL_CAPSULE | Freq: Every day | ORAL | Status: DC

## 2014-07-18 MED ORDER — METOPROLOL SUCCINATE ER 25 MG PO TB24: 25.0000 mg | ORAL_TABLET | Freq: Every day | ORAL | Status: DC

## 2014-07-18 MED ORDER — RIVAROXABAN 20 MG PO TABS: 20.0000 mg | ORAL_TABLET | Freq: Every day | ORAL | Status: DC

## 2014-07-18 NOTE — Telephone Encounter (Signed)
Got a call from cardiology, the patient is having a lot of respiratory symptoms, has an appointment to see me tomorrow. Please call the patient, advised to use both Flonase and Astelin every day. If he needs a refill of Hycodan,  print a prescription.

## 2014-07-18 NOTE — Patient Instructions (Signed)
Your physician recommends that you schedule a follow-up appointment in: 1 month with Roderic Palau NP  You have an appointment with Dr. Larose Kells 2/19 @ 930am

## 2014-07-18 NOTE — Telephone Encounter (Signed)
Called Pt and LMOM informing Pt of recommendations until appt tomorrow.

## 2014-07-18 NOTE — Progress Notes (Signed)
PCP:  [name], MD  Patient presents today for routine EP f/u. Since ablation in 8/14, afib burden has significantly improved but he continues to have break thru afib, usually lasting less than 12 hours. He continues to struggle with his weight, irregular use of cpap, and lack of exercise. Saw his PCP recently and was feeling well with instructions to continue with exercise/weight loss efforts.and was referred back here for f/u on afib.  Pt presents today sick with an upper respiratory infection, consisting of frequent cough, mildly productive of yellow sputum and sinus drainage, since Sunday with unfortunately triggered return of afib with RVR. He states this is the 3rd URI he has had since December He was treated with a z-pak in January and pt continued to cough x one month, had no symptoms x 2 weeks with return of symptoms on Sunday. Slept very poorly last night due to constant coughing. States tessalon pearls are no help, does get better relief from hydrocodone cough syrup. He is taking Muccinex with D and suggested to use plain Muccinex due to afib with rvr  Feels like drainage starts in his sinuses and then moves into chest. He did take an extra metoprolol 25 mg this am.He is afebrile in the office with temp of 97.6 and pulse ox of 97% with clear lungs.  Past Medical History  Diagnosis Date  . Depression     used to see psych, on no meds as of 03/2009  . Rhinitis     vasomotor  . Hypothyroidism   . Hyperlipidemia   . Gout   . GERD (gastroesophageal reflux disease)   . Persistent atrial fibrillation     PVI 12/2012  . Sleep apnea     mild, now treated with CPAP since ~9-12  . Atrial flutter     typical appearing; s/p ablation 12-2012  . Diabetes mellitus     TYPE 2   Past Surgical History  Procedure Laterality Date  . Tonsillectomy    . Vasectomy    . Nasal sinus surgery    . Cardioversion  03/01/11  . Tee without cardioversion N/A 01/04/2013    Procedure: TRANSESOPHAGEAL  ECHOCARDIOGRAM (TEE);  Surgeon: [name], MD;  Location: [facility] ENDOSCOPY;  Service: Cardiovascular;  Laterality: N/A;  . Ablation of dysrhythmic focus  01/05/2013    PVI and flutter ablation by [name]  . Atrial fibrillation ablation N/A 01/05/2013    Procedure: ATRIAL FIBRILLATION ABLATION;  Surgeon: Stones Grayer, MD;  Location: [facility] CATH LAB;  Service: Cardiovascular;  Laterality: N/A;    Current Outpatient Prescriptions  Medication Sig Dispense Refill  . allopurinol (ZYLOPRIM) 300 MG tablet Take 1 tablet (300 mg total) by mouth daily. 90 tablet 1  . azelastine (ASTELIN) 137 MCG/SPRAY nasal spray Place 2 sprays into both nostrils 2 (two) times daily. 30 mL 6  . dexlansoprazole (DEXILANT) 60 MG capsule Take 1 capsule by mouth  daily 90 capsule 1  . diltiazem (CARDIZEM CD) 240 MG 24 hr capsule Take 1 capsule (240 mg total) by mouth daily. 90 capsule 3  . famotidine (PEPCID) 20 MG tablet Take 1 tablet by mouth at  bedtime 90 tablet 1  . fenofibrate 160 MG tablet Take 1 tablet (160 mg total) by mouth daily. 90 tablet 1  . fluticasone (FLONASE) 50 MCG/ACT nasal spray Place into both nostrils daily.    [name] glimepiride (AMARYL) 4 MG tablet Take 1 tablet (4 mg total) by mouth daily before breakfast. 90 tablet 1  .  HYDROcodone-homatropine (HYCODAN) 5-1.5 MG/5ML syrup Take 5 mLs by mouth 3 (three) times daily as needed for cough. 180 mL 0  . levothyroxine (SYNTHROID, LEVOTHROID) 200 MCG tablet Take 1 tablet (200 mcg total) by mouth daily. 90 tablet 1  . linagliptin (TRADJENTA) 5 MG TABS tablet Take 1 tablet daily. 90 tablet 1  . metFORMIN (GLUCOPHAGE) 1000 MG tablet Take 1 tablet (1,000 mg total) by mouth 2 (two) times daily with a meal. 180 tablet 1  . metoprolol succinate (TOPROL-XL) 25 MG 24 hr tablet Take 1 tablet (25 mg total) by mouth daily. 90 tablet 3  . pseudoephedrine-guaifenesin (MUCINEX D) 60-600 MG per tablet Take 1 tablet by mouth every 12 (twelve) hours.    . rivaroxaban (XARELTO) 20 MG  TABS tablet Take 1 tablet (20 mg total) by mouth at bedtime. 90 tablet 3   No current facility-administered medications for this visit.    No Known Allergies  History   Social History  . Marital Status: Married    Spouse Name: N/A  . Number of Children: 2  . Years of Education: N/A   Occupational History  . works at [employer] Topics  . Smoking status: Never Smoker   . Smokeless tobacco: Never Used  . Alcohol Use: No  . Drug Use: No  . Sexual Activity: Not on file   Other Topics Concern  . Not on file   Social History Narrative   Pt lives in [place] with spouse and children 77 and 44 y/o.  Works in Engineer, technical sales at [employer].    Family History  Problem Relation Age of Onset  . Diabetes Father   . Hypertension Brother   . Melanoma Brother   . Asthma Mother   . Allergies Daughter   . Cancer Maternal Uncle     throat  . Throat cancer Maternal Uncle   . Colon cancer Paternal Grandfather   . Prostate cancer Neg Hx     ROS-  All systems are reviewed and are negative except as outlined in the HPI above  Physical Exam: Filed Vitals:   07/18/14 1038  BP: 120/88  Pulse: 142  Height: 6\' 2"  (1.88 m)  Weight: 263 lb 12.8 oz (119.659 kg)    GEN- The patient is sick appearing, but in no acute distress, alert and oriented x 3 today.  Afebrile with temp 97.6, pulse ox 97%.Frequent cough. Head- normocephalic, atraumatic Eyes-  Sclera clear, conjunctiva pink, eyes are watering. Ears- hearing intact Oropharynx- clear Neck- supple, no JVP Lymph- no cervical lymphadenopathy Lungs- Clear to ausculation bilaterally, normal work of breathing Heart- Regular, rapid, rate and rhythm, no murmurs, rubs or gallops, PMI not laterally displaced GI- soft, NT, ND, + BS Extremities- no clubbing, cyanosis, or edema MS- no significant deformity or atrophy Skin- no rash or lesion Psych- euthymic mood, full affect Neuro- strength and sensation are intact  Assessment and  Plan: 1. Paroxymal AFIb Was doing well with minimal afib burden, unfortunately in afib with rvr today driven by acute URI. Advised to stop muccinex D and take plain muccinex. Pt will usually double metoprolol and Cardizem for one to two days with afib. Spoke to Dr. Larose Kells and he will review chart and see if there is anything that is needed to be prescribed today for his URI and will see him at 9:30 am tomorrow. His office will call pt later today. Continue warfarin.  2 Lifestyle risk factors for recurrent afib Weight loss, exercise program  and consistent use of CPAP encouraged.   Return to clinic in one month, hopefully will be over URI and can further evaluate afib burden.

## 2014-07-18 NOTE — Telephone Encounter (Signed)
error:[id number] ° °

## 2014-07-19 ENCOUNTER — Encounter: Payer: Self-pay | Admitting: Internal Medicine

## 2014-07-19 ENCOUNTER — Ambulatory Visit (INDEPENDENT_AMBULATORY_CARE_PROVIDER_SITE_OTHER): Payer: 59 | Admitting: Internal Medicine

## 2014-07-19 VITALS — BP 135/84 | HR 87 | Temp 98.2°F | Ht 74.0 in | Wt 268.5 lb

## 2014-07-19 DIAGNOSIS — R059 Cough, unspecified: Secondary | ICD-10-CM

## 2014-07-19 DIAGNOSIS — J0100 Acute maxillary sinusitis, unspecified: Secondary | ICD-10-CM

## 2014-07-19 DIAGNOSIS — R05 Cough: Secondary | ICD-10-CM

## 2014-07-19 MED ORDER — PREDNISONE 10 MG PO TABS: 10.0000 mg | ORAL_TABLET | Freq: Every day | ORAL | Status: DC

## 2014-07-19 MED ORDER — AMOXICILLIN-POT CLAVULANATE 875-125 MG PO TABS
1.0000 | ORAL_TABLET | Freq: Two times a day (BID) | ORAL | Status: DC
Start: 2014-07-19 — End: 2014-08-16

## 2014-07-19 MED ORDER — HYDROCODONE-HOMATROPINE 5-1.5 MG/5ML PO SYRP: 5.0000 mL | ORAL_SOLUTION | Freq: Three times a day (TID) | ORAL | Status: DC | PRN

## 2014-07-19 NOTE — Patient Instructions (Addendum)
Augmentin 1 tablet twice a day for 10 days (antibiotic) Prednisone for 5 days Check your blood sugar twice a day while on prednisone, stop prednisone if the blood sugar goes over 250 Continue Flonase and Astelin Continue cough suppression with Dextromethorphan and guaifenesin   . Avoid decongestants such as pseudoephedrine. Take  hydrocodone as needed for severe cough.  If you are not improving  in the next few days or if the symptoms resurface, please call, will send you to see the pulmonologist  Please send the labs at your earliest convenience

## 2014-07-19 NOTE — Progress Notes (Signed)
Subjective:    Patient ID: Colin Wood, male    DOB: 1961/05/28, 54 y.o.   MRN: [id number]  DOS:  07/19/2014 Type of visit - description : acute Interval history: Patient here due to severe cough Was seen 05/06/2014, had respiratory symptoms, prescribed Cefnidir He improved but came back 06/03/2014 with symptoms again, chest x-ray was negative, he was prescribed a Z-Pak. He also improved but symptoms started back 07/14/2014, a lot of sinus congestion, yellow nasal discharge, postnasal dripping that triggers cough. Has been unable to sleep well due to the cough which is persistent. Saw cards yesterday, HR elevated   Review of Systems Denies fever or chills. No chest pain or difficulty breathing Good compliance with GERD medications, symptoms are well-controlled. Denies sputum production, thinks all the mucus is coming from the sinuses. No wheezing. Denies any itchy eyes, nose or sneezing  Past Medical History  Diagnosis Date  . Depression     used to see psych, on no meds as of 03/2009  . Rhinitis     vasomotor  . Hypothyroidism   . Hyperlipidemia   . Gout   . GERD (gastroesophageal reflux disease)   . Persistent atrial fibrillation     PVI 12/2012  . Sleep apnea     mild, now treated with CPAP since ~9-12  . Atrial flutter     typical appearing; s/p ablation 12-2012  . Diabetes mellitus     TYPE 2    Past Surgical History  Procedure Laterality Date  . Tonsillectomy    . Vasectomy    . Nasal sinus surgery    . Cardioversion  03/01/11  . Tee without cardioversion N/A 01/04/2013    Procedure: TRANSESOPHAGEAL ECHOCARDIOGRAM (TEE);  Surgeon: [name], MD;  Location: [facility] ENDOSCOPY;  Service: Cardiovascular;  Laterality: N/A;  . Ablation of dysrhythmic focus  01/05/2013    PVI and flutter ablation by [name]  . Atrial fibrillation ablation N/A 01/05/2013    Procedure: ATRIAL FIBRILLATION ABLATION;  Surgeon: Mccorvey Grayer, MD;  Location: [facility] CATH LAB;  Service:  Cardiovascular;  Laterality: N/A;    History   Social History  . Marital Status: Married    Spouse Name: N/A  . Number of Children: 2  . Years of Education: N/A   Occupational History  . works at [employer] Topics  . Smoking status: Never Smoker   . Smokeless tobacco: Never Used  . Alcohol Use: No  . Drug Use: No  . Sexual Activity: Not on file   Other Topics Concern  . Not on file   Social History Narrative   Pt lives in [place] with spouse and children 34 and 76 y/o.  Works in Engineer, technical sales at [employer].        Medication List       This list is accurate as of: 07/19/14 11:59 PM.  Always use your most recent med list.               allopurinol 300 MG tablet  Commonly known as:  ZYLOPRIM  Take 1 tablet (300 mg total) by mouth daily.     amoxicillin-clavulanate 875-125 MG per tablet  Commonly known as:  AUGMENTIN  Take 1 tablet by mouth 2 (two) times daily.     azelastine 0.1 % nasal spray  Commonly known as:  ASTELIN  Place 2 sprays into both nostrils 2 (two) times daily.     dexlansoprazole 60 MG capsule  Commonly known as:  DEXILANT  Take 1 capsule by mouth  daily     diltiazem 240 MG 24 hr capsule  Commonly known as:  CARDIZEM CD  Take 1 capsule (240 mg total) by mouth daily.     famotidine 20 MG tablet  Commonly known as:  PEPCID  Take 1 tablet by mouth at  bedtime     fenofibrate 160 MG tablet  Take 1 tablet (160 mg total) by mouth daily.     fluticasone 50 MCG/ACT nasal spray  Commonly known as:  FLONASE  Place into both nostrils daily.     glimepiride 4 MG tablet  Commonly known as:  AMARYL  Take 1 tablet (4 mg total) by mouth daily before breakfast.     HYDROcodone-homatropine 5-1.5 MG/5ML syrup  Commonly known as:  HYCODAN  Take 5 mLs by mouth 3 (three) times daily as needed for cough.     levothyroxine 200 MCG tablet  Commonly known as:  SYNTHROID, LEVOTHROID  Take 1 tablet (200 mcg total) by mouth daily.      linagliptin 5 MG Tabs tablet  Commonly known as:  TRADJENTA  Take 1 tablet daily.     metFORMIN 1000 MG tablet  Commonly known as:  GLUCOPHAGE  Take 1 tablet (1,000 mg total) by mouth 2 (two) times daily with a meal.     metoprolol succinate 25 MG 24 hr tablet  Commonly known as:  TOPROL-XL  Take 1 tablet (25 mg total) by mouth daily.     predniSONE 10 MG tablet  Commonly known as:  DELTASONE  Take 1 tablet (10 mg total) by mouth daily. 2 tabs a day x 5 days     rivaroxaban 20 MG Tabs tablet  Commonly known as:  XARELTO  Take 1 tablet (20 mg total) by mouth at bedtime.           Objective:   Physical Exam BP 135/84 mmHg  Pulse 87  Temp(Src) 98.2 F (36.8 C) (Oral)  Ht 6\' 2"  (1.88 m)  Wt 268 lb 8 oz (121.791 kg)  BMI 34.46 kg/m2  SpO2 95%  General:   Well developed, well nourished . NAD except for persisting cough HEENT:  Normocephalic . Face symmetric, atraumatic, sinuses not TTP Eyes: Conjunctiva: No redness or discharge Ears: Right ear: Canal normal, TM normal Left ear: Canal normal, TM normal Nose: +++ congested Throat-mouth:  no redness, discharge. Uvula midline. Lungs:  CTA B Normal respiratory effort, no intercostal retractions, no accessory muscle use. Heart: RRR,  no murmur.  Muscle skeletal: no pretibial edema bilaterally  Skin: Not pale. Not jaundice Neurologic:  alert & oriented X3.  Speech normal, gait appropriate for age and unassisted Psych--  Cognition and judgment appear intact.  Cooperative with normal attention span and concentration.  Behavior appropriate. No anxious or depressed appearing.      Assessment & Plan:     Today , I spent more than  25  min with the patient: >50% of the time counseling regards  Pro-cons of more abx and use of prednisone, reviewing visit from last few weeks

## 2014-07-19 NOTE — Progress Notes (Signed)
Pre visit review using our clinic review tool, if applicable. No additional management support is needed unless otherwise documented below in the visit note. 

## 2014-07-21 NOTE — Assessment & Plan Note (Addendum)
See comments under "cough"

## 2014-07-21 NOTE — Assessment & Plan Note (Signed)
Persisting cough, Clinically, the cough is most likely from a sinus infection/congestion and postnasal dripping.  he is status post cefnidir and a Z-Pak, he feels temporarily better while on antibiotics. I am somehow hesitant to prescribe more antibiotics but he may have a partially treated sinusitis. Plan: Augmentin for 10 days Prednisone 20 mg 5 days, call if CBGs more than 250. Claritin daily Dextromethorphan and guaifenesin okay, continue avoiding decongestants Continue Astelin twice a day and Flonase Call in one week, if not improving will refer to Dr. Melvyn Novas again

## 2014-07-30 ENCOUNTER — Telehealth: Payer: Self-pay | Admitting: Internal Medicine

## 2014-07-30 NOTE — Telephone Encounter (Signed)
was seen last month, labs requested, I don't see that he send the reports. Please contact the patient regards this problem

## 2014-07-31 NOTE — Telephone Encounter (Signed)
Letter printed and mailed to Pt.  

## 2014-08-02 LAB — HEMOGLOBIN A1C: HEMOGLOBIN A1C: 7.9 % — AB (ref 4.0–6.0)

## 2014-08-02 LAB — BASIC METABOLIC PANEL
BUN: 15 mg/dL (ref 4–21)
Creatinine: 0.9 mg/dL (ref <=1.3)
Glucose: 226 mg/dL
Potassium: 4.3 mmol/L (ref 3.4–5.3)
Sodium: 137 mmol/L (ref 137–147)

## 2014-08-02 LAB — LIPID PANEL
CHOLESTEROL: 220 mg/dL — AB (ref 0–200)
HDL: 32 mg/dL — AB (ref 35–70)
Triglycerides: 681 mg/dL — AB (ref 40–160)

## 2014-08-02 LAB — HEPATIC FUNCTION PANEL
ALT: 36 U/L (ref 10–40)
AST: 18 U/L (ref 14–40)
Alkaline Phosphatase: 66 U/L (ref 25–125)
Bilirubin, Total: 0.3 mg/dL

## 2014-08-02 LAB — TSH: TSH: 1.07 u[IU]/mL (ref 0.41–5.90)

## 2014-08-12 ENCOUNTER — Telehealth: Payer: Self-pay | Admitting: Internal Medicine

## 2014-08-12 DIAGNOSIS — E1165 Type 2 diabetes mellitus with hyperglycemia: Secondary | ICD-10-CM

## 2014-08-12 NOTE — Telephone Encounter (Signed)
Results reviewed, see below.  Advise patient, diabetes has not improved, recommend endocrinology referral, please arrange. Triglycerides continue to be elevated, continue fenofibrate, once diabetes is better triglycerides should improve. ---------------------   labs from 08/02/2014: creatinine 0.94, potassium 4.3. Chloride 95 (low) AST ALT normal Total cholesterol 220, triglycerides 681 , HDL 32 A1c 7.9 TSH 1.0 Uric acid 4.9

## 2014-08-12 NOTE — Telephone Encounter (Signed)
Patient message sent via MyChart informing Pt of recommendations. Informed him to let us know if he has any questions.

## 2014-08-12 NOTE — Telephone Encounter (Signed)
Referral placed to Endocrinology.  

## 2014-08-16 ENCOUNTER — Ambulatory Visit (INDEPENDENT_AMBULATORY_CARE_PROVIDER_SITE_OTHER): Payer: 59 | Admitting: Nurse Practitioner

## 2014-08-16 ENCOUNTER — Encounter: Payer: Self-pay | Admitting: Nurse Practitioner

## 2014-08-16 VITALS — BP 130/88 | HR 75 | Ht 74.0 in | Wt 266.1 lb

## 2014-08-16 DIAGNOSIS — I480 Paroxysmal atrial fibrillation: Secondary | ICD-10-CM

## 2014-08-16 NOTE — Patient Instructions (Signed)
Your physician wants you to follow-up in: 4 months with Dr Allred You will receive a reminder letter in the mail two months in advance. If you don't receive a letter, please call our office to schedule the follow-up appointment.  

## 2014-08-16 NOTE — Progress Notes (Signed)
PCP:  [name], MD  Patient presents today for routine EP f/u. Since ablation in 8/14, afib burden has significantly improved but he continues to have break thru afib, usually lasting less than 12 hours. He continues to struggle with his weight, irregular use of cpap, and lack of exercise.   Pt presented here 07/18/14 with significant cough and sinus issues and had already been on antibiotics x 2 rounds since December. He had gone into afib with RVR that am with a coughing jag. He was instructed to increase rate control meds and he was referred back to pcp. He presents today feeling much improved having completed round of antibiotics and prednisone. Is in NSR today but still having intermittent breakthrough lasting less than 12 hours. For now, he is satisfied with afib control. He was referred to endocrinology due to poor control of DM, hgb A1c at 7.9, trigs 681, total chol 220, HDL 32. IS pending f/u with sleep apnea MD and is still trying to develop exercise program.  Past Medical History  Diagnosis Date  . Depression     used to see psych, on no meds as of 03/2009  . Rhinitis     vasomotor  . Hypothyroidism   . Hyperlipidemia   . Gout   . GERD (gastroesophageal reflux disease)   . Persistent atrial fibrillation     PVI 12/2012  . Sleep apnea     mild, now treated with CPAP since ~9-12  . Atrial flutter     typical appearing; s/p ablation 12-2012  . Diabetes mellitus     TYPE 2   Past Surgical History  Procedure Laterality Date  . Tonsillectomy    . Vasectomy    . Nasal sinus surgery    . Cardioversion  03/01/11  . Tee without cardioversion N/A 01/04/2013    Procedure: TRANSESOPHAGEAL ECHOCARDIOGRAM (TEE);  Surgeon: [name], MD;  Location: [facility] ENDOSCOPY;  Service: Cardiovascular;  Laterality: N/A;  . Ablation of dysrhythmic focus  01/05/2013    PVI and flutter ablation by [name]  . Atrial fibrillation ablation N/A 01/05/2013    Procedure: ATRIAL FIBRILLATION ABLATION;   Surgeon: Corsetti Grayer, MD;  Location: [facility] CATH LAB;  Service: Cardiovascular;  Laterality: N/A;    Current Outpatient Prescriptions  Medication Sig Dispense Refill  . allopurinol (ZYLOPRIM) 300 MG tablet Take 1 tablet (300 mg total) by mouth daily. 90 tablet 1  . azelastine (ASTELIN) 137 MCG/SPRAY nasal spray Place 2 sprays into both nostrils 2 (two) times daily. 30 mL 6  . dexlansoprazole (DEXILANT) 60 MG capsule Take 1 capsule by mouth  daily 90 capsule 1  . diltiazem (CARDIZEM CD) 240 MG 24 hr capsule Take 1 capsule (240 mg total) by mouth daily. 90 capsule 3  . famotidine (PEPCID) 20 MG tablet Take 1 tablet by mouth at  bedtime 90 tablet 1  . fenofibrate 160 MG tablet Take 1 tablet (160 mg total) by mouth daily. 90 tablet 1  . fluticasone (FLONASE) 50 MCG/ACT nasal spray Place into both nostrils daily.    [name] glimepiride (AMARYL) 4 MG tablet Take 1 tablet (4 mg total) by mouth daily before breakfast. 90 tablet 1  . HYDROcodone-homatropine (HYCODAN) 5-1.5 MG/5ML syrup Take 5 mLs by mouth 3 (three) times daily as needed for cough. 180 mL 0  . levothyroxine (SYNTHROID, LEVOTHROID) 200 MCG tablet Take 1 tablet (200 mcg total) by mouth daily. 90 tablet 1  . linagliptin (TRADJENTA) 5 MG TABS  tablet Take 1 tablet daily. 90 tablet 1  . metFORMIN (GLUCOPHAGE) 1000 MG tablet Take 1 tablet (1,000 mg total) by mouth 2 (two) times daily with a meal. 180 tablet 1  . metoprolol succinate (TOPROL-XL) 25 MG 24 hr tablet Take 1 tablet (25 mg total) by mouth daily. 90 tablet 3  . rivaroxaban (XARELTO) 20 MG TABS tablet Take 1 tablet (20 mg total) by mouth at bedtime. 90 tablet 3   No current facility-administered medications for this visit.    No Known Allergies  History   Social History  . Marital Status: Married    Spouse Name: N/A  . Number of Children: 2  . Years of Education: N/A   Occupational History  . works at [employer] Topics  . Smoking status: Never Smoker   .  Smokeless tobacco: Never Used  . Alcohol Use: No  . Drug Use: No  . Sexual Activity: Not on file   Other Topics Concern  . Not on file   Social History Narrative   Pt lives in [place] with spouse and children 71 and 23 y/o.  Works in Engineer, technical sales at [employer].    Family History  Problem Relation Age of Onset  . Diabetes Father   . Hypertension Brother   . Melanoma Brother   . Asthma Mother   . Allergies Daughter   . Cancer Maternal Uncle     throat  . Throat cancer Maternal Uncle   . Colon cancer Paternal Grandfather   . Prostate cancer Neg Hx     ROS-  All systems are reviewed and are negative except as outlined in the HPI above  Physical Exam: Filed Vitals:   08/16/14 0907  BP: 130/88  Pulse: 75  Height: 6\' 2"  (1.88 m)  Weight: 266 lb 1.9 oz (120.711 kg)    GEN- The patient is sick appearing, but in no acute distress, alert and oriented x 3 today.   Head- normocephalic, atraumatic Eyes-  Sclera clear, conjunctiva pink, eyes are watering. Ears- hearing intact Oropharynx- clear Neck- supple, no JVP Lymph- no cervical lymphadenopathy Lungs- Clear to ausculation bilaterally, normal work of breathing Heart- Regular, rapid, rate and rhythm, no murmurs, rubs or gallops, PMI not laterally displaced GI- soft, NT, ND, + BS Extremities- no clubbing, cyanosis, or edema MS- no significant deformity or atrophy Skin- no rash or lesion Psych- euthymic mood, full affect Neuro- strength and sensation are intact  EKG- NSR, 75 bpm, moderate voltage criteria for LVH, may be normal variant, borderline EKG  Assessment and Plan: 1. Paroxymal AFIb Afib burden less now that URI resolved, but still will have breakthrough spells, for now does not seem to think afib management needs to be changed. Continue xarelto.  2 Lifestyle risk factors for recurrent afib Weight loss, exercise program, control of DM and consistent use of CPAP encouraged.  F/u with [name] in 4 months

## 2014-08-20 ENCOUNTER — Encounter: Payer: Self-pay | Admitting: Internal Medicine

## 2014-08-22 ENCOUNTER — Ambulatory Visit (INDEPENDENT_AMBULATORY_CARE_PROVIDER_SITE_OTHER): Payer: 59 | Admitting: Endocrinology

## 2014-08-22 ENCOUNTER — Encounter: Payer: Self-pay | Admitting: Endocrinology

## 2014-08-22 VITALS — BP 134/88 | HR 82 | Temp 98.3°F | Ht 74.0 in | Wt 271.0 lb

## 2014-08-22 DIAGNOSIS — E781 Pure hyperglyceridemia: Secondary | ICD-10-CM

## 2014-08-22 DIAGNOSIS — E669 Obesity, unspecified: Secondary | ICD-10-CM

## 2014-08-22 DIAGNOSIS — I10 Essential (primary) hypertension: Secondary | ICD-10-CM | POA: Diagnosis not present

## 2014-08-22 DIAGNOSIS — R609 Edema, unspecified: Secondary | ICD-10-CM

## 2014-08-22 DIAGNOSIS — IMO0002 Reserved for concepts with insufficient information to code with codable children: Secondary | ICD-10-CM

## 2014-08-22 DIAGNOSIS — E1165 Type 2 diabetes mellitus with hyperglycemia: Secondary | ICD-10-CM | POA: Diagnosis not present

## 2014-08-22 MED ORDER — CANAGLIFLOZIN 100 MG PO TABS: 100.0000 mg | ORAL_TABLET | Freq: Every day | ORAL | Status: DC

## 2014-08-22 NOTE — Progress Notes (Signed)
Patient ID: Colin Wood, male   DOB: 25-Feb-1961, 54 y.o.   MRN: [id number]           Reason for Appointment: Consultation for Type 2 Diabetes  Referring physician: Larose Kells  History of Present Illness:          Diagnosis: Type 2 diabetes mellitus, date of diagnosis: ?  2011        Past history: He was not having any symptoms at the time of diagnosis and not clear what his initial blood sugars were He was started on metformin initially and had fairly good control Subsequently with higher sugars he was given Amaryl also in addition which has been continued.  A1c was 10.6% in 2012 Subsequently in 2014 his A1c was down to 6.5 but he had a regular follow-up subsequently Because of an A1c of 8.1 in 02/2014 he was given Tradjenta in addition to the above drugs  Recent history:   He is now referred here because of persistently high A1c of 7.9 in 07/2014 He has not been checking his blood sugars much and generally has not been motivated to monitor her treat his diabetes He does not watch his diet and has not made any efforts to lose weight Not exercising at present  His glucose in the lab was 226 recently        Oral hypoglycemic drugs the patient is taking are:   metformin 1 g twice a day, Amaryl 4 mg in a.m., Tradjenta 5 mg daily     Side effects from medications have been:None  Compliance with the medical regimen: Poor  Hypoglycemia:None for several years     Glucose monitoring:  done rarely, 0        Glucometer:?   One Touch.      Blood Glucose readings: fasting reading usually  130-16  Self-care: The diet that the patient has been following GY:JEHU, he has difficulty controlling portions     Meals: 3 meals per day. His breakfast  is usually a biscuit at a fast food restaurant.  He thinks he is snacking on apples and yogurt            Exercise: none          Dietician visit, most recent: none, previously had gone to a class .               Weight history: Previous range 260-280  Wt  Readings from Last 3 Encounters:  08/22/14 271 lb (122.925 kg)  08/16/14 266 lb 1.9 oz (120.711 kg)  07/19/14 268 lb 8 oz (121.791 kg)    Glycemic control:   Lab Results  Component Value Date   HGBA1C 7.9* 08/02/2014   HGBA1C 10.6 06/26/2010   HGBA1C 7.5 04/01/2009   Lab Results  Component Value Date   LDLCALC 118 04/01/2009   CREATININE 0.9 08/02/2014         Medication List       This list is accurate as of: 08/22/14  2:49 PM.  Always use your most recent med list.               allopurinol 300 MG tablet  Commonly known as:  ZYLOPRIM  Take 1 tablet (300 mg total) by mouth daily.     azelastine 0.1 % nasal spray  Commonly known as:  ASTELIN  Place 2 sprays into both nostrils 2 (two) times daily.     canagliflozin 100 MG Tabs tablet  Commonly known as:  Anastasio Auerbach  Take 1 tablet (100 mg total) by mouth daily.     dexlansoprazole 60 MG capsule  Commonly known as:  DEXILANT  Take 1 capsule by mouth  daily     diltiazem 240 MG 24 hr capsule  Commonly known as:  CARDIZEM CD  Take 1 capsule (240 mg total) by mouth daily.     famotidine 20 MG tablet  Commonly known as:  PEPCID  Take 1 tablet by mouth at  bedtime     fenofibrate 160 MG tablet  Take 1 tablet (160 mg total) by mouth daily.     fluticasone 50 MCG/ACT nasal spray  Commonly known as:  FLONASE  Place into both nostrils daily.     glimepiride 4 MG tablet  Commonly known as:  AMARYL  Take 1 tablet (4 mg total) by mouth daily before breakfast.     HYDROcodone-homatropine 5-1.5 MG/5ML syrup  Commonly known as:  HYCODAN  Take 5 mLs by mouth 3 (three) times daily as needed for cough.     levothyroxine 200 MCG tablet  Commonly known as:  SYNTHROID, LEVOTHROID  Take 1 tablet (200 mcg total) by mouth daily.     linagliptin 5 MG Tabs tablet  Commonly known as:  TRADJENTA  Take 1 tablet daily.     metFORMIN 1000 MG tablet  Commonly known as:  GLUCOPHAGE  Take 1 tablet (1,000 mg total) by mouth 2  (two) times daily with a meal.     metoprolol succinate 25 MG 24 hr tablet  Commonly known as:  TOPROL-XL  Take 1 tablet (25 mg total) by mouth daily.     rivaroxaban 20 MG Tabs tablet  Commonly known as:  XARELTO  Take 1 tablet (20 mg total) by mouth at bedtime.        Allergies: No Known Allergies  Past Medical History  Diagnosis Date  . Depression     used to see psych, on no meds as of 03/2009  . Rhinitis     vasomotor  . Hypothyroidism   . Hyperlipidemia   . Gout   . GERD (gastroesophageal reflux disease)   . Persistent atrial fibrillation     PVI 12/2012  . Sleep apnea     mild, now treated with CPAP since ~9-12  . Atrial flutter     typical appearing; s/p ablation 12-2012  . Diabetes mellitus     TYPE 2    Past Surgical History  Procedure Laterality Date  . Tonsillectomy    . Vasectomy    . Nasal sinus surgery    . Cardioversion  03/01/11  . Tee without cardioversion N/A 01/04/2013    Procedure: TRANSESOPHAGEAL ECHOCARDIOGRAM (TEE);  Surgeon: [name], MD;  Location: [facility] ENDOSCOPY;  Service: Cardiovascular;  Laterality: N/A;  . Ablation of dysrhythmic focus  01/05/2013    PVI and flutter ablation by [name]  . Atrial fibrillation ablation N/A 01/05/2013    Procedure: ATRIAL FIBRILLATION ABLATION;  Surgeon: Coen Grayer, MD;  Location: [facility] CATH LAB;  Service: Cardiovascular;  Laterality: N/A;    Family History  Problem Relation Age of Onset  . Diabetes Father   . Hypertension Brother   . Melanoma Brother   . Asthma Mother   . Allergies Daughter   . Cancer Maternal Uncle     throat  . Throat cancer Maternal Uncle   . Colon cancer Paternal Grandfather   . Prostate cancer Neg Hx     Social History:  reports that he  has never smoked. He has never used smokeless tobacco. He reports that he does not drink alcohol or use illicit drugs.    Review of Systems       Vision is normal. Most recent eye exam was in 7/15 Showing no retinopathy        Lipids:   has high triglycerides mostly; no recent LDL available.  On Fenofibrate but triglycerides are still markedly increased       Lab Results  Component Value Date   CHOL 220* 08/02/2014   HDL 32* 08/02/2014   LDLCALC 118 04/01/2009   TRIG 681* 08/02/2014                  Skin: No infections.  He has noticed recent edema at times of, not on ACE inhibitor his lower legs     Thyroid:  No  unusual fatigue.  was diagnosed to have hypothyroidism in 1994 has been on medication since then He is compliant with his medication  Lab Results  Component Value Date   TSH 1.07 08/02/2014   TSH 4.550 06/26/2010   TSH 5.970 04/01/2009       The blood pressure has been recently higher, has only been on diltiazem for his atrial arrhythmia    Occasionally may have swelling of feet.  He has been given Lasix to take as needed but does not take this.    He has had long-standing recurrent atrial fibrillation and is also on anticoagulation for this     No shortness of breath or chest tightness  on exertion.     He has had sleep apnea but has difficulty using the mask at night and also has insomnia    He has had allergic rhinitis, on medications     Bowel habits: Normal.  No nausea      No joint  or low back pains.          No history of Numbness, tingling or burning in feet     LABS:  No visits with results within 1 Week(s) from this visit. Latest known visit with results is:  Abstract on 08/12/2014  Component Date Value Ref Range Status  . Glucose 08/02/2014 226   Final  . BUN 08/02/2014 15  4 - 21 mg/dL Final  . Creatinine 08/02/2014 0.9  .6 - 1.3 mg/dL Final  . Potassium 08/02/2014 4.3  3.4 - 5.3 mmol/L Final  . Sodium 08/02/2014 137  137 - 147 mmol/L Final  . Triglycerides 08/02/2014 681* 40 - 160 mg/dL Final  . Cholesterol 08/02/2014 220* 0 - 200 mg/dL Final  . HDL 08/02/2014 32* 35 - 70 mg/dL Final  . Alkaline Phosphatase 08/02/2014 66  25 - 125 U/L Final  . ALT 08/02/2014 36  10 -  40 U/L Final  . AST 08/02/2014 18  14 - 40 U/L Final  . Bilirubin, Total 08/02/2014 0.3   Final  . Hgb A1c MFr Bld 08/02/2014 7.9* 4.0 - 6.0 % Final  . TSH 08/02/2014 1.07  0.41 - 5.90 uIU/mL Final    Physical Examination:  BP 134/88 mmHg  Pulse 82  Temp(Src) 98.3 F (36.8 C) (Oral)  Ht 6\' 2"  (1.88 m)  Wt 271 lb (122.925 kg)  BMI 34.78 kg/m2  SpO2 96%  GENERAL:         Patient has generalized obesity.   HEENT:         Eye exam shows normal external appearance. Fundus exam shows no retinopathy. Oral exam  shows normal mucosa .  NECK:         General:  Neck exam shows no lymphadenopathy. Carotids are normal to palpation and no bruit heard.  Thyroid is not enlarged and no nodules felt.   LUNGS:         Chest is symmetrical. Lungs are clear to auscultation.[name]   HEART:         Heart sounds:  S1 and S2 are normal. No murmurs or clicks heard., no S3 or S4.   ABDOMEN:   There is no distention present. Liver and spleen are not palpable. No other mass or tenderness present.  EXTREMITIES:     There is  1+ lower leg edema bilaterally. No skin lesions present.[name]  NEUROLOGICAL:   Vibration sense is  mildly reduced in toes. Ankle jerks are absent bilaterally.          Diabetic foot exam shows normal monofilament sensation in the toes and plantar surfaces, no skin lesions or ulcers on the feet and normal pedal pulses MUSCULOSKELETAL:       There is no enlargement or deformity of the joints. Spine is normal to inspection.[name]   SKIN:       Stasis pigmentation is present on lower legs.     ASSESSMENT:  Diabetes type 2, uncontrolled with obesity He is on a 3 drug regimen of metformin, Amaryl and Tradgenta with blood sugars as high as 226 So far has not been motivated to participate in self-care measures like diet, exercise or glucose monitoring. He has difficulty with losing weight and has not been able to watch his diet or control portions  Discussed with the patient that a change of  treatment regimen  is necessary because of progression of his diabetes and need for more potent medications. His blood sugars have not improved with using Tradgenta since 02/2014 He is a good candidate for a GLP-1 drug which would help with better control and also weight loss but he is reluctant to take an injectable medication at this time.  Also his insurance is not covering the weekly drug Trulicity which would be easy to use.  The pen was demonstrated to the patient.  He is interested in nutritional counseling and is also trying to join Weight Watchers program He is interested also in trying to start an exercise program with walking  However discussed that to help with weight loss he should benefit from medications like Invokana which would help his glucose control also significantly Discussed action of SGLT 2 drugs on lowering glucose by decreasing kidney absorption of glucose, benefits of weight loss, reduction of fluid retention and lower blood pressure, possible side effects including candidiasis.  Reassured about the safety of these medications   Complications: None  HYPERTRIGLYCERIDEMIA: This has been poorly controlled with fenofibrate alone and patient needs significant improvement in his diet, glucose control, weight and also probably addition of prescription fish oil product.  Lower leg edema: This may be related to venous insufficiency as he has not had any proteinuria or reduced cardiac function. May improve with adding Invokana  HYPERTENSION: He has had mild hypertension and currently blood pressure appears to be relatively high  HYPOTHYROIDISM: This is long-standing and appears to be well controlled with current dose of Synthroid  PLAN:   Start Invokana 100 mg daily in the morning.  He will stop Tradgenta  Change Amaryl to 2 mg twice a day and consider stopping one of the doses if blood sugars are below 70  Start monitoring blood sugars at various times even if he does this once a day on an  average  Start walking program  Consultation with dietitian  Follow-up in 4 weeks to review blood sugar control  Recheck fasting lipids when blood sugars are better controlled  Consider Victoza or Tanzeum if blood sugar is not improved and weight loss not achieved  Patient Instructions  Please check blood sugars at least half the time about 2 hours after any meal and 3 times per week on waking up.  Please bring blood sugar monitor to each visit. Recommended blood sugar levels about 2 hours after meal is 140-160 and on waking up 90-130  INVOKANA: Start taking this in the morning before breakfast May stop linagliptin/Tradgenta when starting this Change glimepiride to half a tablet twice a day at breakfast and suppertime Call if having blood sugars below 70, may need to reduce the glimepiride   Start walking program with at least 15-20 minutes, 5 times a week and increase gradually as tolerated     Counseling time over 50% of today's 60 minute visit  Orion Mole 08/22/2014, 2:49 PM   Note: This office note was prepared with Dragon voice recognition system technology. Any transcriptional errors that result from this process are unintentional.

## 2014-08-22 NOTE — Patient Instructions (Addendum)
Please check blood sugars at least half the time about 2 hours after any meal and 3 times per week on waking up.  Please bring blood sugar monitor to each visit. Recommended blood sugar levels about 2 hours after meal is 140-160 and on waking up 90-130  INVOKANA: Start taking this in the morning before breakfast May stop linagliptin/Tradgenta when starting this Change glimepiride to half a tablet twice a day at breakfast and suppertime Call if having blood sugars below 70, may need to reduce the glimepiride   Start walking program with at least 15-20 minutes, 5 times a week and increase gradually as tolerated

## 2014-08-23 DIAGNOSIS — IMO0002 Reserved for concepts with insufficient information to code with codable children: Secondary | ICD-10-CM | POA: Insufficient documentation

## 2014-08-23 DIAGNOSIS — E781 Pure hyperglyceridemia: Secondary | ICD-10-CM | POA: Insufficient documentation

## 2014-08-23 DIAGNOSIS — E1165 Type 2 diabetes mellitus with hyperglycemia: Secondary | ICD-10-CM | POA: Insufficient documentation

## 2014-09-16 ENCOUNTER — Ambulatory Visit: Payer: 59 | Admitting: Endocrinology

## 2014-09-23 ENCOUNTER — Ambulatory Visit: Payer: 59 | Admitting: Endocrinology

## 2014-10-22 ENCOUNTER — Ambulatory Visit: Payer: 59 | Admitting: Internal Medicine

## 2014-11-04 ENCOUNTER — Ambulatory Visit: Payer: 59 | Admitting: Endocrinology

## 2014-11-11 ENCOUNTER — Ambulatory Visit: Payer: 59 | Admitting: Endocrinology

## 2014-11-18 ENCOUNTER — Other Ambulatory Visit: Payer: Self-pay | Admitting: Endocrinology

## 2014-11-26 ENCOUNTER — Encounter: Payer: Self-pay | Admitting: Endocrinology

## 2014-11-26 ENCOUNTER — Ambulatory Visit (INDEPENDENT_AMBULATORY_CARE_PROVIDER_SITE_OTHER): Payer: 59 | Admitting: Endocrinology

## 2014-11-26 VITALS — BP 120/78 | HR 113 | Temp 97.7°F | Resp 16 | Ht 74.0 in | Wt 260.0 lb

## 2014-11-26 DIAGNOSIS — E781 Pure hyperglyceridemia: Secondary | ICD-10-CM | POA: Diagnosis not present

## 2014-11-26 DIAGNOSIS — E1165 Type 2 diabetes mellitus with hyperglycemia: Secondary | ICD-10-CM

## 2014-11-26 DIAGNOSIS — E790 Hyperuricemia without signs of inflammatory arthritis and tophaceous disease: Secondary | ICD-10-CM

## 2014-11-26 DIAGNOSIS — IMO0002 Reserved for concepts with insufficient information to code with codable children: Secondary | ICD-10-CM

## 2014-11-26 DIAGNOSIS — E038 Other specified hypothyroidism: Secondary | ICD-10-CM

## 2014-11-26 NOTE — Progress Notes (Addendum)
Patient ID: Colin Wood, male   DOB: 1961-02-01, 54 y.o.   MRN: [id number]           Reason for Appointment: Follow-up for Type 2 Diabetes  Referring physician: Larose Wood  History of Present Illness:          Diagnosis: Type 2 diabetes mellitus, date of diagnosis: ?  2011        Past history: He was not having any symptoms at the time of diagnosis and not clear what his initial blood sugars were He was started on metformin initially and had fairly good control Subsequently with higher sugars he was given Amaryl also in addition which has been continued.  A1c was 10.6% in 2012 Subsequently in 2014 his A1c was down to 6.5 but he had a regular follow-up subsequently Because of an A1c of 8.1 in 02/2014 he was given Tradjenta in addition to the above drugs  Recent history:   He was referred here because of a high A1c of 7.9 in 07/2014 He was on metformin and also Amaryl and Tradgenta Overall his compliance with diet, exercise and glucose monitoring was poor and he was advised accordingly to improve his self-care regimen He was started on Invokana 100 mg daily instead of Tradjenta for multiple benefits He was also referred to the dietitian but he did not make an appointment  He is back here 3 months later and he has not changed his lifestyle and has not done any exercise or blood sugar monitoring No A1c available He has checked a few readings in the mornings and these are somewhat variable His weight has improved with starting Invokana although he does not think he has lost any abdominal fat He thinks he has been working long hours and has had stress and has not paid attention to his diabetes        Oral hypoglycemic drugs the patient is taking are:   metformin 1 g twice a day, Amaryl 4 mg in a.m., Tradjenta 5 mg daily     Side effects from medications have been:None  Compliance with the medical regimen: Poor  Hypoglycemia: None  Glucose monitoring:  done occasionally        Glucometer:?    One Touch.      Blood Glucose readings by recall: fasting 118-185   Self-care: The diet that the patient has been following DJ:SHFW, he has difficulty controlling portions     Meals: 3 meals per day. His breakfast  is usually a biscuit at a fast food restaurant.  He  is snacking on apples and yogurt            Exercise: none          Dietician visit, most recent: none, previously had gone to a class .               Weight history: Previous range 260-280  Wt Readings from Last 3 Encounters:  11/26/14 260 lb (117.935 kg)  08/22/14 271 lb (122.925 kg)  08/16/14 266 lb 1.9 oz (120.711 kg)    Glycemic control:   Lab Results  Component Value Date   HGBA1C 7.9* 08/02/2014   HGBA1C 10.6 06/26/2010   HGBA1C 7.5 04/01/2009   Lab Results  Component Value Date   LDLCALC 118 04/01/2009   CREATININE 0.9 08/02/2014         Medication List       This list is accurate as of: 11/26/14 10:10 AM.  Always use your most  recent med list.               allopurinol 300 MG tablet  Commonly known as:  ZYLOPRIM  Take 1 tablet (300 mg total) by mouth daily.     azelastine 0.1 % nasal spray  Commonly known as:  ASTELIN  Place 2 sprays into both nostrils 2 (two) times daily.     dexlansoprazole 60 MG capsule  Commonly known as:  DEXILANT  Take 1 capsule by mouth  daily     diltiazem 240 MG 24 hr capsule  Commonly known as:  CARDIZEM CD  Take 1 capsule (240 mg total) by mouth daily.     famotidine 20 MG tablet  Commonly known as:  PEPCID  Take 1 tablet by mouth at  bedtime     fenofibrate 160 MG tablet  Take 1 tablet (160 mg total) by mouth daily.     fluticasone 50 MCG/ACT nasal spray  Commonly known as:  FLONASE  Place into both nostrils daily.     glimepiride 4 MG tablet  Commonly known as:  AMARYL  Take 1 tablet (4 mg total) by mouth daily before breakfast.     HYDROcodone-homatropine 5-1.5 MG/5ML syrup  Commonly known as:  HYCODAN  Take 5 mLs by mouth 3 (three) times  daily as needed for cough.     INVOKANA 100 MG Tabs tablet  Generic drug:  canagliflozin  TAKE 1 TABLET (100 MG TOTAL) BY MOUTH DAILY.     levothyroxine 200 MCG tablet  Commonly known as:  SYNTHROID, LEVOTHROID  Take 1 tablet (200 mcg total) by mouth daily.     linagliptin 5 MG Tabs tablet  Commonly known as:  TRADJENTA  Take 1 tablet daily.     metFORMIN 1000 MG tablet  Commonly known as:  GLUCOPHAGE  Take 1 tablet (1,000 mg total) by mouth 2 (two) times daily with a meal.     metoprolol succinate 25 MG 24 hr tablet  Commonly known as:  TOPROL-XL  Take 1 tablet (25 mg total) by mouth daily.     rivaroxaban 20 MG Tabs tablet  Commonly known as:  XARELTO  Take 1 tablet (20 mg total) by mouth at bedtime.        Allergies: No Known Allergies  Past Medical History  Diagnosis Date  . Depression     used to see psych, on no meds as of 03/2009  . Rhinitis     vasomotor  . Hypothyroidism   . Hyperlipidemia   . Gout   . GERD (gastroesophageal reflux disease)   . Persistent atrial fibrillation     PVI 12/2012  . Sleep apnea     mild, now treated with CPAP since ~9-12  . Atrial flutter     typical appearing; s/p ablation 12-2012  . Diabetes mellitus     TYPE 2    Past Surgical History  Procedure Laterality Date  . Tonsillectomy    . Vasectomy    . Nasal sinus surgery    . Cardioversion  03/01/11  . Tee without cardioversion N/A 01/04/2013    Procedure: TRANSESOPHAGEAL ECHOCARDIOGRAM (TEE);  Surgeon: Colin Records, MD;  Location: [facility] ENDOSCOPY;  Service: Cardiovascular;  Laterality: N/A;  . Ablation of dysrhythmic focus  01/05/2013    PVI and flutter ablation by Dr Colin Wood  . Atrial fibrillation ablation N/A 01/05/2013    Procedure: ATRIAL FIBRILLATION ABLATION;  Surgeon: Colin Grayer, MD;  Location: [facility] CATH LAB;  Service: Cardiovascular;  Laterality: N/A;    Family History  Problem Relation Age of Onset  . Diabetes Father   . Hypertension Brother   . Melanoma Brother     . Asthma Mother   . Allergies Daughter   . Cancer Maternal Uncle     throat  . Throat cancer Maternal Uncle   . Colon cancer Paternal Grandfather   . Prostate cancer Neg Hx     Social History:  reports that he has never smoked. He has never used smokeless tobacco. He reports that he does not drink alcohol or use illicit drugs.    Review of Systems   Most recent eye exam was in 7/15 Showing no retinopathy        Lipids:  has high triglycerides mostly; no recent LDL available.  On Fenofibrate but triglycerides are still markedly increased       Lab Results  Component Value Date   CHOL 220* 08/02/2014   HDL 32* 08/02/2014   LDLCALC 118 04/01/2009   TRIG 681* 08/02/2014               Leg swelling less recently     Thyroid:  No  unusual fatigue.  was diagnosed to have hypothyroidism in 1994 has been on medication since then He is compliant with his medication  Lab Results  Component Value Date   TSH 1.07 08/02/2014   TSH 4.550 06/26/2010   TSH 5.970 04/01/2009       The blood pressure has been high, has only been on diltiazem for his atrial arrhythmia     LABS:  No visits with results within 1 Week(s) from this visit. Latest known visit with results is:  Abstract on 08/12/2014  Component Date Value Ref Range Status  . Glucose 08/02/2014 226   Final  . BUN 08/02/2014 15  4 - 21 mg/dL Final  . Creatinine 08/02/2014 0.9  .6 - 1.3 mg/dL Final  . Potassium 08/02/2014 4.3  3.4 - 5.3 mmol/L Final  . Sodium 08/02/2014 137  137 - 147 mmol/L Final  . Triglycerides 08/02/2014 681* 40 - 160 mg/dL Final  . Cholesterol 08/02/2014 220* 0 - 200 mg/dL Final  . HDL 08/02/2014 32* 35 - 70 mg/dL Final  . Alkaline Phosphatase 08/02/2014 66  25 - 125 U/L Final  . ALT 08/02/2014 36  10 - 40 U/L Final  . AST 08/02/2014 18  14 - 40 U/L Final  . Bilirubin, Total 08/02/2014 0.3   Final  . Hgb A1c MFr Bld 08/02/2014 7.9* 4.0 - 6.0 % Final  . TSH 08/02/2014 1.07  0.41 - 5.90 uIU/mL  Final    Physical Examination:  BP 120/78 mmHg  Pulse 113  Temp(Src) 97.7 F (36.5 C) (Oral)  Resp 16  Ht 6\' 2"  (1.88 m)  Wt 260 lb (117.935 kg)  BMI 33.37 kg/m2  SpO2 97%  No ankle edema present    ASSESSMENT:  Diabetes type 2, uncontrolled with obesity He is on a 3 drug regimen of metformin, Amaryl and Invokana Again has not been motivated to participate in self-care measures like diet, exercise or glucose monitoring as directed. However he does want to start changing his lifestyle and also being more regular with his follow-up A1c needs to be assessed today to see if he has improved with starting Invokana in March  HYPERTRIGLYCERIDEMIA: This has been poorly controlled with fenofibrate alone and will need to reassess this  PLAN:   No change in medications as yet, consider increasing  dose of Invokana or switching to a GLP-1 drug  Labs to be done today  Start monitoring blood sugars at various times especially after meals  Start walking program  Consultation with dietitian  Follow-up in 6 weeks  Patient Instructions  Check blood sugars on waking up .. 3 .. times a week Also check blood sugars about 2 hours after a meal and do this after different meals by rotation  Recommended blood sugar levels on waking up is 90-130 and about 2 hours after meal is 140-180 Please bring blood sugar monitor to each visit.  Walk daily          Colin Wood 11/26/2014, 10:10 AM   Note: This office note was prepared with Estate agent. Any transcriptional errors that result from this process are unintentional.  ADDENDUM: TSH is 7%, lab glucose 120 with normal chemistry

## 2014-11-26 NOTE — Patient Instructions (Signed)
Check blood sugars on waking up .. 3 .. times a week Also check blood sugars about 2 hours after a meal and do this after different meals by rotation  Recommended blood sugar levels on waking up is 90-130 and about 2 hours after meal is 140-180 Please bring blood sugar monitor to each visit.  Walk daily

## 2014-12-05 ENCOUNTER — Telehealth: Payer: Self-pay | Admitting: Endocrinology

## 2014-12-05 NOTE — Telephone Encounter (Signed)
Please let him know that his A1c was better at 7%, no change in medications for diabetes Triglycerides are 423, not controlled.  Needs to start Lovaza 4 g daily Thyroid and uric acid are normal  Will need fasting lipids before his next visit

## 2014-12-06 ENCOUNTER — Other Ambulatory Visit: Payer: Self-pay | Admitting: *Deleted

## 2014-12-06 MED ORDER — OMEGA-3-ACID ETHYL ESTERS 1 G PO CAPS: 2.0000 g | ORAL_CAPSULE | Freq: Two times a day (BID) | ORAL | Status: DC

## 2014-12-06 MED ORDER — OMEGA-3-ACID ETHYL ESTERS 1 G PO CAPS: ORAL_CAPSULE | ORAL | Status: DC

## 2014-12-06 NOTE — Telephone Encounter (Signed)
Noted, patient is aware. 

## 2014-12-12 ENCOUNTER — Encounter: Payer: Self-pay | Admitting: Endocrinology

## 2014-12-20 ENCOUNTER — Encounter: Payer: Self-pay | Admitting: Dietician

## 2014-12-20 ENCOUNTER — Encounter: Payer: 59 | Attending: Endocrinology | Admitting: Dietician

## 2014-12-20 VITALS — Ht 74.0 in | Wt 262.0 lb

## 2014-12-20 DIAGNOSIS — Z713 Dietary counseling and surveillance: Secondary | ICD-10-CM | POA: Diagnosis not present

## 2014-12-20 DIAGNOSIS — E781 Pure hyperglyceridemia: Secondary | ICD-10-CM | POA: Insufficient documentation

## 2014-12-20 DIAGNOSIS — E785 Hyperlipidemia, unspecified: Secondary | ICD-10-CM | POA: Diagnosis not present

## 2014-12-20 DIAGNOSIS — E119 Type 2 diabetes mellitus without complications: Secondary | ICD-10-CM | POA: Diagnosis present

## 2014-12-20 NOTE — Progress Notes (Signed)
Diabetes Self-Management Education  Visit Type: First/Initial  Appt. Start Time: 0800 Appt. End Time: 0930  12/20/2014  Mr. Colin Wood, identified by name and date of birth, is a 54 y.o. male with a diagnosis of Diabetes: Type 2 (2011).  Other people present during visit:  Patient, Spouse/SO (Wife Vaughan Basta)   Patient lives with his wife, 13 yo daughter and 55 yo grandson, and 85 yo daughter who struggles with her weight.  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  Hx includes Afib and Sleep Apnea.  He does not wear the C-pap as he finds it uncomfortable.  Hx of type 2 diabetes since 2011 and hyperlipidemia.    Labs include HgbA1C of 7.0% in June/July per patient which is decreased from 7.9% in April.  Cholesterol:  220, Triglycerides 681, HDL:  32.  He does not check his blood sugar.  States that he does take his medications.    Weight of 262 lbs today and would like to lose weight.  He weighed 280 lbs 15 years ago, lost 30 lbs and regained part of that back.  He lost through exercise and joined [employer].  He has rejoined [employer] again.  "I'm never hungry because I am always full."  Reports eating all of the time, especially at night, for all reasons (stressed, tired, happy, sad etc.)  "I live to eat."  ASSESSMENT  Height 6\' 2"  (1.88 m), weight 262 lb (118.842 kg). Body mass index is 33.62 kg/(m^2).  Initial Visit Information:  Are you currently following a meal plan?: No   Are you taking your medications as prescribed?: Yes Are you checking your feet?: No   How often do you need to have someone help you when you read instructions, pamphlets, or other written materials from your doctor or pharmacy?: 1 - Never What is the last grade level you completed in school?: 2 years college  Psychosocial:   Patient Belief/Attitude about Diabetes: Motivated to manage diabetes Self-care barriers: Other (comment) (long work hours) Self-management support:  [place] office, Family Other persons present: Patient, Spouse/SO (Wife Vaughan Basta) Patient Concerns: Nutrition/Meal planning  Complications:   Last HgB A1C per patient/outside source: 7 mg/dL (July 2016 per pt.) How often do you check your blood sugar?: 0 times/day (not testing) Number of hypoglycemic episodes per month: 1 (due to missing a meal) Can you tell when your blood sugar is low?: Yes What do you do if your blood sugar is low?: eat crackers or meal Have you had a dilated eye exam in the past 12 months?: Yes Have you had a dental exam in the past 12 months?: Yes  Diet Intake:  Breakfast: [place]:  biscuits and sausage or other fried foods OR cereal and milk, OR oatmeal and milk Or sausage, egg, on 2 slices [employer] toast (8-8:41 or & 7:30 out to eat) Snack (morning): fruit or or crackers or yogurt (10) Lunch: Fast food or brings salad with protein Snack (afternoon): fruit or crackers or yogurt (3) Dinner: chicken or steak, or Lasagne, potatoes, veges, salad (6-6:30 pm) Snack (evening): popcorn OR PB jelly sandwich OR leftovers  (Large intake at night) Beverage(s): water, caffeine free diet Dr. Malachi Bonds, 1% milk  Exercise:  Exercise: ADL's  Individualized Plan for Diabetes Self-Management Training:   Learning Objective:  Patient will have a greater understanding of diabetes self-management.  Patient education plan per assessed needs and concerns is to attend individual sessions for  Education Topics Reviewed with Patient Today:  Definition of diabetes, type 1 and 2, and the diagnosis of diabetes Role of diet in the treatment of diabetes and the relationship between the three main macronutrients and blood glucose level, Food label reading, portion sizes and measuring food., Carbohydrate counting, Reviewed blood glucose goals for pre and post meals and how to evaluate the patients' food intake on their blood glucose level., Information on hints to eating out and maintain blood  glucose control., Meal options for control of blood glucose level and chronic complications. Role of exercise on diabetes management, blood pressure control and cardiac health.       Relationship between chronic complications and blood glucose control      PATIENTS GOALS/Plan (Developed by the patient):  Nutrition: Follow meal plan discussed, General guidelines for healthy choices and portions discussed Medications: take my medication as prescribed Monitoring : test my blood glucose as discussed (note x per day with comment) (2 times per day rotating) Reducing Risk: examine blood glucose patterns, do foot checks daily  Plan:   Patient Instructions  Be as active as you can.  Use a fit bit or pedometer on your phone. Consistent exercise.  Begin the habit. Aim for 3-4 Carb Choices per meal (45-60 grams) +/- 1 either way  Aim for 0-1 Carbs per snack if hungry  Include protein in moderation with your meals and snacks Consider reading food labels for Total Carbohydrate and Fat Grams of foods Consider checking BG at alternate times per day as directed by MD  Consider taking medication as directed by MD  Expected Outcomes:  Other (comment) (Demonstrated interest in learning.  Change will be difficult with current schedule.  Encouraged change and patient verbalized need and desire to change.)  Education material provided: Living Well with Diabetes, Food label handouts, Meal plan card and Snack sheet,  Breakfast ideas.  Mailed sample meal plans and resources for apps.  If problems or questions, patient to contact team via:  Phone and Email  Future DSME appointment: PRN

## 2014-12-20 NOTE — Patient Instructions (Signed)
Be as active as you can.  Use a fit bit or pedometer on your phone. Consistent exercise.  Begin the habit. Aim for 3-4 Carb Choices per meal (45-60 grams) +/- 1 either way  Aim for 0-1 Carbs per snack if hungry  Include protein in moderation with your meals and snacks Consider reading food labels for Total Carbohydrate and Fat Grams of foods Consider checking BG at alternate times per day as directed by MD  Consider taking medication as directed by MD

## 2014-12-21 ENCOUNTER — Other Ambulatory Visit: Payer: Self-pay | Admitting: Endocrinology

## 2015-01-14 LAB — HEMOGLOBIN A1C: Hgb A1c MFr Bld: 6.9 % — AB (ref 4.0–6.0)

## 2015-01-17 ENCOUNTER — Other Ambulatory Visit: Payer: Self-pay | Admitting: *Deleted

## 2015-01-17 ENCOUNTER — Ambulatory Visit (INDEPENDENT_AMBULATORY_CARE_PROVIDER_SITE_OTHER): Payer: 59 | Admitting: Endocrinology

## 2015-01-17 ENCOUNTER — Encounter: Payer: Self-pay | Admitting: Endocrinology

## 2015-01-17 VITALS — BP 134/84 | HR 92 | Temp 97.9°F | Resp 16 | Ht 74.0 in | Wt 259.6 lb

## 2015-01-17 DIAGNOSIS — E785 Hyperlipidemia, unspecified: Secondary | ICD-10-CM | POA: Diagnosis not present

## 2015-01-17 DIAGNOSIS — E1165 Type 2 diabetes mellitus with hyperglycemia: Secondary | ICD-10-CM | POA: Diagnosis not present

## 2015-01-17 DIAGNOSIS — E119 Type 2 diabetes mellitus without complications: Secondary | ICD-10-CM

## 2015-01-17 DIAGNOSIS — IMO0002 Reserved for concepts with insufficient information to code with codable children: Secondary | ICD-10-CM

## 2015-01-17 DIAGNOSIS — E039 Hypothyroidism, unspecified: Secondary | ICD-10-CM

## 2015-01-17 MED ORDER — GLIMEPIRIDE 2 MG PO TABS: 2.0000 mg | ORAL_TABLET | Freq: Two times a day (BID) | ORAL | Status: DC

## 2015-01-17 MED ORDER — ATORVASTATIN CALCIUM 10 MG PO TABS: 10.0000 mg | ORAL_TABLET | Freq: Every day | ORAL | Status: DC

## 2015-01-17 NOTE — Progress Notes (Signed)
Patient ID: Colin Wood, male   DOB: [birth year], 54 y.o.   MRN: [id number]           Reason for Appointment: Follow-up for Type 2 Diabetes  Referring physician: Larose Kells  History of Present Illness:          Diagnosis: Type 2 diabetes mellitus, date of diagnosis: ?  2011        Past history: He was not having any symptoms at the time of diagnosis and not clear what his initial blood sugars were He was started on metformin initially and had fairly good control Subsequently with higher sugars he was given Amaryl also in addition which has been continued.  A1c was 10.6% in 2012 Subsequently in 2014 his A1c was down to 6.5 but he had a regular follow-up subsequently Because of an A1c of 8.1 in 02/2014 he was given Tradjenta in addition to the above drugs but this was not effective  He was referred here because of a high A1c of 7.9 in 07/2014  Recent history:   He was started on Invokana 100 mg daily instead of Tradjenta for multiple benefits He was also referred to the dietitian but he did not make an appointment Overall his compliance with diet, exercise and glucose monitoring has been generally poor and he was advised to start improving his meal planning and start an exercise regimen  More recently his A1c is below 7%, he does think that he will tend to get high readings if he goes off his diet However has not started an exercise regimen He is generally trying to watch his diet better but this is very inconsistent still and he has not lost any weight; had lost some weight initially with starting Invokana He says that he generally takes 4 mg Amaryl in the morning if he is eating a big breakfast but otherwise will take a half tablet twice a day Once or twice a week he will feel hypoglycemic midmorning or late afternoon if he is eating a lighter meal at breakfast or lunch        Oral hypoglycemic drugs the patient is taking are:   metformin 1 g twice a day, Amaryl 4 mg in a.m., Invokana       Side effects from medications have been:None  Compliance with the medical regimen: Fair   Glucose monitoring:  done occasionally        Glucometer:?   One Touch.      Blood Glucose readings by recall: Average about 151 Fasting 78-150s pcs variable, < 260 lowest non-fasting reading about 70  Self-care: The diet that the patient has been following HW:EXHB, he has difficulty controlling portions     Meals: 3 meals per day. His breakfast  is usually a biscuit at a fast food restaurant.  He  is snacking on apples and yogurt            Exercise: none despite reminders           Dietician visit, most recent: none, previously had gone to a class .               Weight history: Previous range 260-280  Wt Readings from Last 3 Encounters:  01/17/15 259 lb 9.6 oz (117.754 kg)  12/20/14 262 lb (118.842 kg)  11/26/14 260 lb (117.935 kg)    Glycemic control:   Lab Results  Component Value Date   HGBA1C 7.9* 08/02/2014   HGBA1C 10.6 06/26/2010   HGBA1C 7.5  04/01/2009   Lab Results  Component Value Date   LDLCALC 118 04/01/2009   CREATININE 0.9 08/02/2014         Medication List       This list is accurate as of: 01/17/15  1:42 PM.  Always use your most recent med list.               allopurinol 300 MG tablet  Commonly known as:  ZYLOPRIM  Take 1 tablet (300 mg total) by mouth daily.     atorvastatin 10 MG tablet  Commonly known as:  LIPITOR  Take 1 tablet (10 mg total) by mouth daily.     azelastine 0.1 % nasal spray  Commonly known as:  ASTELIN  Place 2 sprays into both nostrils 2 (two) times daily.     dexlansoprazole 60 MG capsule  Commonly known as:  DEXILANT  Take 1 capsule by mouth  daily     diltiazem 240 MG 24 hr capsule  Commonly known as:  CARDIZEM CD  Take 1 capsule (240 mg total) by mouth daily.     famotidine 20 MG tablet  Commonly known as:  PEPCID  Take 1 tablet by mouth at  bedtime     fenofibrate 160 MG tablet  Take 1 tablet (160 mg total) by  mouth daily.     fluticasone 50 MCG/ACT nasal spray  Commonly known as:  FLONASE  Place into both nostrils daily.     glimepiride 2 MG tablet  Commonly known as:  AMARYL  Take 1 tablet (2 mg total) by mouth 2 (two) times daily.     HYDROcodone-homatropine 5-1.5 MG/5ML syrup  Commonly known as:  HYCODAN  Take 5 mLs by mouth 3 (three) times daily as needed for cough.     INVOKANA 100 MG Tabs tablet  Generic drug:  canagliflozin  TAKE 1 TABLET BY MOUTH EVERY DAY     levothyroxine 200 MCG tablet  Commonly known as:  SYNTHROID, LEVOTHROID  Take 1 tablet (200 mcg total) by mouth daily.     metFORMIN 1000 MG tablet  Commonly known as:  GLUCOPHAGE  Take 1 tablet (1,000 mg total) by mouth 2 (two) times daily with a meal.     metoprolol succinate 25 MG 24 hr tablet  Commonly known as:  TOPROL-XL  Take 1 tablet (25 mg total) by mouth daily.     omega-3 acid ethyl esters 1 G capsule  Commonly known as:  LOVAZA  Take 1 capsule 2 times a day (4G) daily     rivaroxaban 20 MG Tabs tablet  Commonly known as:  XARELTO  Take 1 tablet (20 mg total) by mouth at bedtime.        Allergies: No Known Allergies  Past Medical History  Diagnosis Date  . Depression     used to see psych, on no meds as of 03/2009  . Rhinitis     vasomotor  . Hypothyroidism   . Hyperlipidemia   . Gout   . GERD (gastroesophageal reflux disease)   . Persistent atrial fibrillation     PVI 12/2012  . Sleep apnea     mild, now treated with CPAP since ~9-12  . Atrial flutter     typical appearing; s/p ablation 12-2012  . Diabetes mellitus     TYPE 2    Past Surgical History  Procedure Laterality Date  . Tonsillectomy    . Vasectomy    . Nasal sinus surgery    .  Cardioversion  03/01/11  . Tee without cardioversion N/A 01/04/2013    Procedure: TRANSESOPHAGEAL ECHOCARDIOGRAM (TEE);  Surgeon: [name], MD;  Location[facility] ENDOSCOPY;  Service: Cardiovascular;  Laterality: N/A;  . Ablation of dysrhythmic  focus  01/05/2013    PVI and flutter ablation by [name]  . Atrial fibrillation ablation N/A 01/05/2013    Procedure: ATRIAL FIBRILLATION ABLATION;  Surgeon: Briney Grayer, MD;  Location: [facility] CATH LAB;  Service: Cardiovascular;  Laterality: N/A;    Family History  Problem Relation Age of Onset  . Diabetes Father   . Hypertension Brother   . Melanoma Brother   . Asthma Mother   . Allergies Daughter   . Cancer Maternal Uncle     throat  . Throat cancer Maternal Uncle   . Colon cancer Paternal Grandfather   . Prostate cancer Neg Hx     Social History:  reports that he has never smoked. He has never used smokeless tobacco. He reports that he does not drink alcohol or use illicit drugs.    Review of Systems   Most recent eye exam was in 7/15 Showing no retinopathy        Lipids:  has high triglycerides mostly; LDL is also consistently high.   On Fenofibrate but triglycerides are still  Increased, previously as high as 681 Triglycerides 276, HDL 45, LDL 128 on 01/13/15       Lab Results  Component Value Date   CHOL 220* 08/02/2014   HDL 32* 08/02/2014   LDLCALC 118 04/01/2009   TRIG 681* 08/02/2014               Leg swelling has improved with Invokana     Thyroid: He was diagnosed to have hypothyroidism in 1994 has been on medication since then He is compliant with his medication  Lab Results  Component Value Date   TSH 1.07 08/02/2014   TSH 4.550 06/26/2010   TSH 5.970 04/01/2009       The blood pressure has been high, has only been on diltiazem for his atrial arrhythmia and low-dose metoprolol    LABS:  No visits with results within 1 Week(s) from this visit. Latest known visit with results is:  Abstract on 08/12/2014  Component Date Value Ref Range Status  . Glucose 08/02/2014 226   Final  . BUN 08/02/2014 15  4 - 21 mg/dL Final  . Creatinine 08/02/2014 0.9  .6 - 1.3 mg/dL Final  . Potassium 08/02/2014 4.3  3.4 - 5.3 mmol/L Final  . Sodium 08/02/2014 137   137 - 147 mmol/L Final  . Triglycerides 08/02/2014 681* 40 - 160 mg/dL Final  . Cholesterol 08/02/2014 220* 0 - 200 mg/dL Final  . HDL 08/02/2014 32* 35 - 70 mg/dL Final  . Alkaline Phosphatase 08/02/2014 66  25 - 125 U/L Final  . ALT 08/02/2014 36  10 - 40 U/L Final  . AST 08/02/2014 18  14 - 40 U/L Final  . Bilirubin, Total 08/02/2014 0.3   Final  . Hgb A1c MFr Bld 08/02/2014 7.9* 4.0 - 6.0 % Final  . TSH 08/02/2014 1.07  0.41 - 5.90 uIU/mL Final    Physical Examination:  BP 134/84 mmHg  Pulse 92  Temp(Src) 97.9 F (36.6 C)  Resp 16  Ht 6\' 2"  (1.88 m)  Wt 259 lb 9.6 oz (117.754 kg)  BMI 33.32 kg/m2  SpO2 96%  He has some stasis dermatitis in his lower legs    ASSESSMENT:  Diabetes type 2, uncontrolled with obesity He is on a 3 drug regimen of metformin, Amaryl and Invokana Again has not been consistent with his diet although he is trying to do better and his A1c is improving especially with adding Invokana He can however start exercising and discussed options of doing this without walking outside, he does have some exercise equipment at home He may not be checking enough readings after meals and this will need to be assessed on his monitor download, he has not been compliant with bringing his monitor for review  HYPERTRIGLYCERIDEMIA: This has been inadequately controlled with fenofibrate alone and will need to have him work harder on weight loss and high fat meals HIGH cholesterol: Discussed that he has high risk factors and explained the association between diabetes and vascular disease.  He has not been on a statin before and LDL is consistently over 100 Also statins will help his triglycerides also  Stasis dermatitis: He may discuss this  dermatologist but likely this will be persistent, recently not having edema  PLAN:   No change in medications as yet, consider increasing dose of Invokana or using GLP-1 drug to help with dietary compliance  If he has consistently  lower readings during the day he can reduce his Amaryl to 1 mg twice a day, he was given a prescription on hand for this  Start monitoring blood sugars at various times especially after meals and bring monitor on each visit  Start walking program or other exercise at home  Follow up with dietitian as indicated  Follow-up in 3 months  Lipitor 10 mg daily in addition to fenofibrate and follow-up lipids on the next visit  Check TSH twice a year  Patient Instructions  Exercise daily  Check blood sugars on waking up .[name] 2-3 .[name] times a week Also check blood sugars about 2 hours after a meal and do this after different meals by rotation  Recommended blood sugar levels on waking up is 90-130 and about 2 hours after meal is 140-180 Please bring blood sugar monitor to each visit.  May reduce Glimeperide to 1mg  if sugars stay low       Counseling time on subjects discussed above is over 50% of today's 25 minute visit   James Lafalce 01/17/2015, 1:42 PM   Note: This office note was prepared with Estate agent. Any transcriptional errors that result from this process are unintentional.

## 2015-01-17 NOTE — Patient Instructions (Signed)
Exercise daily  Check blood sugars on waking up .[name] 2-3 .[name] times a week Also check blood sugars about 2 hours after a meal and do this after different meals by rotation  Recommended blood sugar levels on waking up is 90-130 and about 2 hours after meal is 140-180 Please bring blood sugar monitor to each visit.  May reduce Glimeperide to 1mg  if sugars stay low

## 2015-02-15 ENCOUNTER — Other Ambulatory Visit: Payer: Self-pay | Admitting: Internal Medicine

## 2015-02-24 ENCOUNTER — Other Ambulatory Visit: Payer: Self-pay | Admitting: *Deleted

## 2015-02-24 MED ORDER — CANAGLIFLOZIN 100 MG PO TABS: 100.0000 mg | ORAL_TABLET | Freq: Every day | ORAL | Status: DC

## 2015-02-27 ENCOUNTER — Other Ambulatory Visit: Payer: Self-pay | Admitting: *Deleted

## 2015-02-27 MED ORDER — OMEGA-3-ACID ETHYL ESTERS 1 G PO CAPS: ORAL_CAPSULE | ORAL | Status: DC

## 2015-03-12 ENCOUNTER — Ambulatory Visit (INDEPENDENT_AMBULATORY_CARE_PROVIDER_SITE_OTHER): Payer: 59 | Admitting: Internal Medicine

## 2015-03-12 ENCOUNTER — Encounter: Payer: Self-pay | Admitting: Internal Medicine

## 2015-03-12 ENCOUNTER — Telehealth: Payer: Self-pay

## 2015-03-12 ENCOUNTER — Other Ambulatory Visit: Payer: Self-pay | Admitting: *Deleted

## 2015-03-12 VITALS — BP 116/74 | HR 76 | Temp 98.1°F | Ht 74.0 in | Wt 259.5 lb

## 2015-03-12 DIAGNOSIS — E039 Hypothyroidism, unspecified: Secondary | ICD-10-CM | POA: Diagnosis not present

## 2015-03-12 DIAGNOSIS — M109 Gout, unspecified: Secondary | ICD-10-CM | POA: Diagnosis not present

## 2015-03-12 DIAGNOSIS — E119 Type 2 diabetes mellitus without complications: Secondary | ICD-10-CM | POA: Diagnosis not present

## 2015-03-12 DIAGNOSIS — E785 Hyperlipidemia, unspecified: Secondary | ICD-10-CM | POA: Diagnosis not present

## 2015-03-12 DIAGNOSIS — Z23 Encounter for immunization: Secondary | ICD-10-CM

## 2015-03-12 DIAGNOSIS — Z09 Encounter for follow-up examination after completed treatment for conditions other than malignant neoplasm: Secondary | ICD-10-CM | POA: Insufficient documentation

## 2015-03-12 MED ORDER — LEVOTHYROXINE SODIUM 200 MCG PO TABS: 200.0000 ug | ORAL_TABLET | Freq: Every day | ORAL | Status: DC

## 2015-03-12 MED ORDER — DEXLANSOPRAZOLE 60 MG PO CPDR: 60.0000 mg | DELAYED_RELEASE_CAPSULE | Freq: Every day | ORAL | Status: DC

## 2015-03-12 MED ORDER — ALLOPURINOL 300 MG PO TABS: 300.0000 mg | ORAL_TABLET | Freq: Every day | ORAL | Status: DC

## 2015-03-12 MED ORDER — FLUTICASONE PROPIONATE 50 MCG/ACT NA SUSP: 2.0000 | Freq: Every day | NASAL | Status: DC

## 2015-03-12 MED ORDER — METFORMIN HCL 1000 MG PO TABS: 1000.0000 mg | ORAL_TABLET | Freq: Two times a day (BID) | ORAL | Status: DC

## 2015-03-12 MED ORDER — FENOFIBRATE 160 MG PO TABS: 160.0000 mg | ORAL_TABLET | Freq: Every day | ORAL | Status: DC

## 2015-03-12 MED ORDER — GLIMEPIRIDE 4 MG PO TABS: 4.0000 mg | ORAL_TABLET | Freq: Every day | ORAL | Status: DC

## 2015-03-12 MED ORDER — FAMOTIDINE 20 MG PO TABS: 20.0000 mg | ORAL_TABLET | Freq: Every day | ORAL | Status: DC

## 2015-03-12 NOTE — Progress Notes (Signed)
Pre visit review using our clinic review tool, if applicable. No additional management support is needed unless otherwise documented below in the visit note. 

## 2015-03-12 NOTE — Telephone Encounter (Signed)
Pt was here today with Dr. Larose Kells (PCP) for his routine follow-up. Pt is needing refills on:  Metformin HCL 1000 mg Glimepiride 4 mg  These medications were previously maintained by Dr. Larose Kells, since Pt is now seeing Dr. Dwyane Dee for his Diabetes management, Dr. Larose Kells requesting to transfer these medications over to Dr. Dwyane Dee for management. Pt requesting 90 day supplies be sent to [facility]. Thank you.

## 2015-03-12 NOTE — Progress Notes (Signed)
Subjective:    Patient ID: Colin Wood, male    DOB: [birth year], 54 y.o.   MRN: [id number]  DOS:  03/12/2015 Type of visit - description : Routine visit Interval history: Diabetes: Now per Dr. Jene Every has decreased Atrial fibrillation: On Xarelto, last cardiology visit few months ago, note reviewed. Stable. Gout: On allopurinol, no recent attacks. GERD: Good compliance with medications, asymptomatic.   Review of Systems No chest pain, difficulty breathing or lower extremity edema. No recent palpitations. No nausea, vomiting, blood in the stools. No gross hematuria or dysuria  Past Medical History  Diagnosis Date  . Depression     used to see psych, on no meds as of 03/2009  . Rhinitis     vasomotor  . Hypothyroidism   . Hyperlipidemia   . Gout   . GERD (gastroesophageal reflux disease)   . Persistent atrial fibrillation ([place])     PVI 12/2012  . Sleep apnea     mild, now treated with CPAP since ~9-12  . Atrial flutter ([place])     typical appearing; s/p ablation 12-2012  . Diabetes mellitus     TYPE 2    Past Surgical History  Procedure Laterality Date  . Tonsillectomy    . Vasectomy    . Nasal sinus surgery    . Cardioversion  03/01/11  . Tee without cardioversion N/A 01/04/2013    Procedure: TRANSESOPHAGEAL ECHOCARDIOGRAM (TEE);  Surgeon: [name], MD;  Location[facility] ENDOSCOPY;  Service: Cardiovascular;  Laterality: N/A;  . Ablation of dysrhythmic focus  01/05/2013    PVI and flutter ablation by [name]  . Atrial fibrillation ablation N/A 01/05/2013    Procedure: ATRIAL FIBRILLATION ABLATION;  Surgeon: Erno Grayer, MD;  Location: [facility] CATH LAB;  Service: Cardiovascular;  Laterality: N/A;    Social History   Social History  . Marital Status: Married    Spouse Name: N/A  . Number of Children: 2  . Years of Education: N/A   Occupational History  . works at [employer] Topics  . Smoking status: Never Smoker   . Smokeless tobacco:  Never Used  . Alcohol Use: No  . Drug Use: No  . Sexual Activity: Not on file   Other Topics Concern  . Not on file   Social History Narrative   Pt lives in [place] with spouse and children 64 and 55 y/o.  Works in Engineer, technical sales at [employer].        Medication List       This list is accurate as of: 03/12/15  5:45 PM.  Always use your most recent med list.               allopurinol 300 MG tablet  Commonly known as:  ZYLOPRIM  Take 1 tablet (300 mg total) by mouth daily.     atorvastatin 10 MG tablet  Commonly known as:  LIPITOR  Take 1 tablet (10 mg total) by mouth daily.     azelastine 0.1 % nasal spray  Commonly known as:  ASTELIN  Place 2 sprays into both nostrils 2 (two) times daily.     canagliflozin 100 MG Tabs tablet  Commonly known as:  INVOKANA  Take 1 tablet (100 mg total) by mouth daily.     dexlansoprazole 60 MG capsule  Commonly known as:  DEXILANT  Take 1 capsule (60 mg total) by mouth daily.     diltiazem 240 MG 24 hr capsule  Commonly known as:  CARDIZEM CD  Take 1 capsule (240 mg total) by mouth daily.     famotidine 20 MG tablet  Commonly known as:  PEPCID  Take 1 tablet (20 mg total) by mouth at bedtime.     fenofibrate 160 MG tablet  Take 1 tablet (160 mg total) by mouth daily.     fluticasone 50 MCG/ACT nasal spray  Commonly known as:  FLONASE  Place 2 sprays into both nostrils daily.     glimepiride 4 MG tablet  Commonly known as:  AMARYL  Take 1 tablet (4 mg total) by mouth daily with breakfast.     levothyroxine 200 MCG tablet  Commonly known as:  SYNTHROID, LEVOTHROID  Take 1 tablet (200 mcg total) by mouth daily before breakfast.     metFORMIN 1000 MG tablet  Commonly known as:  GLUCOPHAGE  Take 1 tablet (1,000 mg total) by mouth 2 (two) times daily with a meal.     metoprolol succinate 25 MG 24 hr tablet  Commonly known as:  TOPROL-XL  Take 1 tablet (25 mg total) by mouth daily.     omega-3 acid ethyl esters 1 G capsule    Commonly known as:  LOVAZA  Take 1 capsule 2 times a day (4G) daily     rivaroxaban 20 MG Tabs tablet  Commonly known as:  XARELTO  Take 1 tablet (20 mg total) by mouth at bedtime.           Objective:   Physical Exam BP 116/74 mmHg  Pulse 76  Temp(Src) 98.1 F (36.7 C) (Oral)  Ht 6\' 2"  (1.88 m)  Wt 259 lb 8 oz (117.708 kg)  BMI 33.30 kg/m2  SpO2 96% General:   Well developed, well nourished . NAD.  HEENT:  Normocephalic . Face symmetric, atraumatic Lungs:  CTA B Normal respiratory effort, no intercostal retractions, no accessory muscle use. Heart: Irregular irregular,  no murmur.  No pretibial edema bilaterally  Skin: Not pale. Not jaundice Neurologic:  alert & oriented X3.  Speech normal, gait appropriate for age and unassisted Psych--  Cognition and judgment appear intact.  Cooperative with normal attention span and concentration.  Behavior appropriate. No anxious or depressed appearing.      Assessment & Plan:   Assessment > DM Dr. Dwyane Dee since 07-2014 Hyperlipidemia Hypothyroidism A flutter, ablation 12-2012, now paroxysmal, Xarelto GERD Depression Gout [name], CPAP since 2012  Plan DM: Now under the care of Dr. Dwyane Dee, last A1c 6.9 Hyperlipidemia: Recent triglycerides elevated, he was on fenofibrate and Lipitor was added. Check a FLP, AST, ALT. Gout: Refill medications GERD, RF medications Hypothyroidism: good compliance of medication, check a TSH Primary care: Prevnar today, will get a flu shot at work. Check hep C and HIV RTC 4 months for a physical

## 2015-03-12 NOTE — Patient Instructions (Signed)
Send the results of your fasting bloodwork as soon as possible.   Next visit  for a physical exam in 4 months, no fasting.     Please schedule an appointment at the front desk

## 2015-03-12 NOTE — Assessment & Plan Note (Signed)
DM: Now under the care of Dr. Dwyane Dee, last A1c 6.9 Hyperlipidemia: Recent triglycerides elevated, he was on fenofibrate and Lipitor was added. Check a FLP, AST, ALT. Gout: Refill medications GERD, RF medications Hypothyroidism: good compliance of medication, check a TSH Primary care: Prevnar today, will get a flu shot at work. Check hep C and HIV RTC 4 months for a physical

## 2015-03-12 NOTE — Telephone Encounter (Signed)
Rx's were sent to [facility] as requested.

## 2015-03-19 LAB — LIPID PANEL
CHOLESTEROL: 179 mg/dL (ref 0–200)
HDL: 38 mg/dL (ref 35–70)
LDL Cholesterol: 93 mg/dL
Triglycerides: 238 mg/dL — AB (ref 40–160)

## 2015-03-19 LAB — HEPATIC FUNCTION PANEL
ALT: 35 U/L (ref 10–40)
AST: 22 U/L (ref 14–40)
Alkaline Phosphatase: 57 U/L (ref 25–125)
Bilirubin, Total: 0.4 mg/dL

## 2015-03-19 LAB — BASIC METABOLIC PANEL
BUN: 17 mg/dL (ref 4–21)
CREATININE: 1 mg/dL (ref 0.6–1.3)
GLUCOSE: 158 mg/dL
Potassium: 4.7 mmol/L (ref 3.4–5.3)
Sodium: 143 mmol/L (ref 137–147)

## 2015-03-19 LAB — TSH: TSH: 0.32 u[IU]/mL — AB (ref 0.41–5.90)

## 2015-03-19 LAB — CBC AND DIFFERENTIAL
HEMATOCRIT: 46 % (ref 41–53)
Hemoglobin: 15.8 g/dL (ref 13.5–17.5)
Neutrophils Absolute: 3 /uL
Platelets: 215 10*3/uL (ref 150–399)
WBC: 5.7 10^3/mL

## 2015-03-20 ENCOUNTER — Encounter: Payer: Self-pay | Admitting: Internal Medicine

## 2015-03-20 ENCOUNTER — Telehealth: Payer: Self-pay | Admitting: Internal Medicine

## 2015-03-20 NOTE — Telephone Encounter (Signed)
CBC normal, creatinine 1.0, LFTs normal, potassium 4.7. Total cholesterol 179, triglycerides 258, LDL 93. HIV and hep C negative. TSH 0.3, slightly low. Will send a message. Colin Wood, your triglycerides have improved, your thyroid test is a slightly off but for now I recommend you to continue taking the same Synthroid dose as well as all the other medications. Good results.

## 2015-03-27 ENCOUNTER — Other Ambulatory Visit: Payer: Self-pay | Admitting: *Deleted

## 2015-03-27 MED ORDER — ATORVASTATIN CALCIUM 10 MG PO TABS: 10.0000 mg | ORAL_TABLET | Freq: Every day | ORAL | Status: DC

## 2015-03-28 ENCOUNTER — Encounter: Payer: Self-pay | Admitting: Internal Medicine

## 2015-04-04 ENCOUNTER — Telehealth: Payer: Self-pay | Admitting: Endocrinology

## 2015-04-04 NOTE — Telephone Encounter (Signed)
Patient ask if you could fax his lab order to him he will get his labs drawn at [facility]  Fax # [phone]

## 2015-04-07 ENCOUNTER — Other Ambulatory Visit: Payer: Self-pay | Admitting: Endocrinology

## 2015-04-07 NOTE — Telephone Encounter (Signed)
Orders faxed on Friday.

## 2015-04-08 ENCOUNTER — Other Ambulatory Visit: Payer: Self-pay | Admitting: Endocrinology

## 2015-04-08 LAB — COMPREHENSIVE METABOLIC PANEL
ALBUMIN: 4.8 g/dL (ref 3.5–5.5)
ALT: 31 IU/L (ref 0–44)
AST: 19 IU/L (ref 0–40)
Albumin/Globulin Ratio: 2.3 (ref 1.1–2.5)
Alkaline Phosphatase: 55 IU/L (ref 39–117)
BILIRUBIN TOTAL: 0.4 mg/dL (ref 0.0–1.2)
BUN / CREAT RATIO: 16 (ref 9–20)
BUN: 17 mg/dL (ref 6–24)
CHLORIDE: 99 mmol/L (ref 97–106)
CO2: 19 mmol/L (ref 18–29)
Calcium: 9.8 mg/dL (ref 8.7–10.2)
Creatinine, Ser: 1.07 mg/dL (ref 0.76–1.27)
GFR calc non Af Amer: 78 mL/min/{1.73_m2} (ref >59)
GFR, EST AFRICAN AMERICAN: 90 mL/min/{1.73_m2} (ref >59)
GLOBULIN, TOTAL: 2.1 g/dL (ref 1.5–4.5)
GLUCOSE: 145 mg/dL — AB (ref 65–99)
Potassium: 4.6 mmol/L (ref 3.5–5.2)
SODIUM: 139 mmol/L (ref 136–144)
TOTAL PROTEIN: 6.9 g/dL (ref 6.0–8.5)

## 2015-04-09 LAB — MICROALBUMIN / CREATININE URINE RATIO
Creatinine, Urine: 96.2 mg/dL
MICROALB/CREAT RATIO: 5.9 mg/g creat (ref 0.0–30.0)
MICROALBUM., U, RANDOM: 5.7 ug/mL

## 2015-04-09 LAB — TSH: TSH: 8.05 u[IU]/mL — ABNORMAL HIGH (ref 0.450–4.500)

## 2015-04-11 ENCOUNTER — Ambulatory Visit (INDEPENDENT_AMBULATORY_CARE_PROVIDER_SITE_OTHER): Payer: 59 | Admitting: Endocrinology

## 2015-04-11 ENCOUNTER — Other Ambulatory Visit: Payer: Self-pay | Admitting: *Deleted

## 2015-04-11 ENCOUNTER — Encounter: Payer: Self-pay | Admitting: Endocrinology

## 2015-04-11 VITALS — BP 126/84 | HR 94 | Temp 98.5°F | Resp 16 | Ht 74.0 in | Wt 261.0 lb

## 2015-04-11 DIAGNOSIS — E119 Type 2 diabetes mellitus without complications: Secondary | ICD-10-CM

## 2015-04-11 DIAGNOSIS — E1165 Type 2 diabetes mellitus with hyperglycemia: Secondary | ICD-10-CM

## 2015-04-11 DIAGNOSIS — E038 Other specified hypothyroidism: Secondary | ICD-10-CM | POA: Diagnosis not present

## 2015-04-11 DIAGNOSIS — E669 Obesity, unspecified: Secondary | ICD-10-CM | POA: Diagnosis not present

## 2015-04-11 DIAGNOSIS — E785 Hyperlipidemia, unspecified: Secondary | ICD-10-CM

## 2015-04-11 MED ORDER — ATORVASTATIN CALCIUM 10 MG PO TABS: 10.0000 mg | ORAL_TABLET | Freq: Every day | ORAL | Status: DC

## 2015-04-11 MED ORDER — CANAGLIFLOZIN 100 MG PO TABS: 100.0000 mg | ORAL_TABLET | Freq: Every day | ORAL | Status: DC

## 2015-04-11 MED ORDER — OMEGA-3-ACID ETHYL ESTERS 1 G PO CAPS: ORAL_CAPSULE | ORAL | Status: DC

## 2015-04-11 MED ORDER — GLIMEPIRIDE 4 MG PO TABS: 4.0000 mg | ORAL_TABLET | Freq: Every day | ORAL | Status: DC

## 2015-04-11 MED ORDER — METFORMIN HCL 1000 MG PO TABS: 1000.0000 mg | ORAL_TABLET | Freq: Two times a day (BID) | ORAL | Status: DC

## 2015-04-11 NOTE — Progress Notes (Signed)
Patient ID: Colin Wood, male   DOB: 25-Mar-1961, 54 y.o.   MRN: [id number]           Reason for Appointment: Follow-up for Type 2 Diabetes  Referring physician: Larose Kells  History of Present Illness:          Diagnosis: Type 2 diabetes mellitus, date of diagnosis: ?  2011        Past history: He was not having any symptoms at the time of diagnosis and not clear what his initial blood sugars were He was started on metformin initially and had fairly good control Subsequently with higher sugars he was given Amaryl also in addition which has been continued.  A1c was 10.6% in 2012 Subsequently in 2014 his A1c was down to 6.5 but he had a regular follow-up subsequently Because of an A1c of 8.1 in 02/2014 he was given Tradjenta in addition to the above drugs but this was not effective  He was referred here because of a high A1c of 7.9 in 07/2014  Recent history:   Oral hypoglycemic drugs the patient is taking are: metformin 1 g twice a day, Amaryl 4 mg in a.m., Invokana 100 mg daily      He was started on Invokana 100 mg daily instead of Tradjenta for multiple benefits He has difficulty controlling his diabetes because of his poor compliance with diet, exercise and glucose monitoring On his last visit he was doing relatively better and was also occasionally getting mild hypoglycemia with his Amaryl; his A1c had gone down to 6.9 Again however he has been poorly compliant with diet, exercise and glucose monitoring Recent A1c not available  Current management, blood sugar values and problems identified:  He is doing only fasting readings recently instead of a variety of readings despite instructions; also his monitor has incorrect date programmed.  His fasting readings are relatively high with average 145  He says his fasting sugars maybe higher on some mornings when he is eating a large snack later at night  Probably not able to control his portions and snacks partly because of  stress  Still not motivated to exercise        Side effects from medications have been:None  Compliance with the medical regimen: Fair   Glucose monitoring:  done occasionally        Glucometer:   One Touch.      Blood Glucose readings by download:   Recent range 125-177  Self-care: The diet that the patient has been following is: None, he has difficulty controlling portions     Meals: 3 meals per day. His breakfast  is usually a biscuit at a fast food restaurant.  He  is snacking on apples and yogurt  snacks           Exercise: none despite reminders           Dietician visit, most recent: none, previously had gone to a class, reluctant to make appointment .               Weight history: Previous range 260-280  Wt Readings from Last 3 Encounters:  04/11/15 261 lb (118.389 kg)  03/12/15 259 lb 8 oz (117.708 kg)  01/17/15 259 lb 9.6 oz (117.754 kg)    Glycemic control:   Lab Results  Component Value Date   HGBA1C 6.9* 01/14/2015   HGBA1C 7.9* 08/02/2014   HGBA1C 10.6 06/26/2010   Lab Results  Component Value Date   LDLCALC 93 03/19/2015  CREATININE 1.07 04/07/2015        Medication List       This list is accurate as of: 04/11/15 12:52 PM.  Always use your most recent med list.               allopurinol 300 MG tablet  Commonly known as:  ZYLOPRIM  Take 1 tablet (300 mg total) by mouth daily.     atorvastatin 10 MG tablet  Commonly known as:  LIPITOR  Take 1 tablet (10 mg total) by mouth daily.     azelastine 0.1 % nasal spray  Commonly known as:  ASTELIN  Place 2 sprays into both nostrils 2 (two) times daily.     canagliflozin 100 MG Tabs tablet  Commonly known as:  INVOKANA  Take 1 tablet (100 mg total) by mouth daily.     dexlansoprazole 60 MG capsule  Commonly known as:  DEXILANT  Take 1 capsule (60 mg total) by mouth daily.     diltiazem 240 MG 24 hr capsule  Commonly known as:  CARDIZEM CD  Take 1 capsule (240 mg total) by mouth daily.      famotidine 20 MG tablet  Commonly known as:  PEPCID  Take 1 tablet (20 mg total) by mouth at bedtime.     fenofibrate 160 MG tablet  Take 1 tablet (160 mg total) by mouth daily.     fluticasone 50 MCG/ACT nasal spray  Commonly known as:  FLONASE  Place 2 sprays into both nostrils daily.     glimepiride 4 MG tablet  Commonly known as:  AMARYL  Take 1 tablet (4 mg total) by mouth daily with breakfast.     levothyroxine 200 MCG tablet  Commonly known as:  SYNTHROID, LEVOTHROID  Take 1 tablet (200 mcg total) by mouth daily before breakfast.     metFORMIN 1000 MG tablet  Commonly known as:  GLUCOPHAGE  Take 1 tablet (1,000 mg total) by mouth 2 (two) times daily with a meal.     metoprolol succinate 25 MG 24 hr tablet  Commonly known as:  TOPROL-XL  Take 1 tablet (25 mg total) by mouth daily.     omega-3 acid ethyl esters 1 G capsule  Commonly known as:  LOVAZA  Take 1 capsule 2 times a day (4G) daily     rivaroxaban 20 MG Tabs tablet  Commonly known as:  XARELTO  Take 1 tablet (20 mg total) by mouth at bedtime.        Allergies: No Known Allergies  Past Medical History  Diagnosis Date  . Depression     used to see psych, on no meds as of 03/2009  . Rhinitis     vasomotor  . Hypothyroidism   . Hyperlipidemia   . Gout   . GERD (gastroesophageal reflux disease)   . Persistent atrial fibrillation ([place])     PVI 12/2012  . Sleep apnea     mild, now treated with CPAP since ~9-12  . Atrial flutter ([place])     typical appearing; s/p ablation 12-2012  . Diabetes mellitus     TYPE 2    Past Surgical History  Procedure Laterality Date  . Tonsillectomy    . Vasectomy    . Nasal sinus surgery    . Cardioversion  03/01/11  . Tee without cardioversion N/A 01/04/2013    Procedure: TRANSESOPHAGEAL ECHOCARDIOGRAM (TEE);  Surgeon: [name], MD;  Location: [facility];  Service: Cardiovascular;  Laterality: N/A;  .  Ablation of dysrhythmic focus  01/05/2013    PVI and  flutter ablation by [name]  . Atrial fibrillation ablation N/A 01/05/2013    Procedure: ATRIAL FIBRILLATION ABLATION;  Surgeon: Uncapher Grayer, MD;  Location: [facility] CATH LAB;  Service: Cardiovascular;  Laterality: N/A;    Family History  Problem Relation Age of Onset  . Diabetes Father   . Hypertension Brother   . Melanoma Brother   . Asthma Mother   . Allergies Daughter   . Cancer Maternal Uncle     throat  . Throat cancer Maternal Uncle   . Colon cancer Paternal Grandfather   . Prostate cancer Neg Hx     Social History:  reports that he has never smoked. He has never used smokeless tobacco. He reports that he does not drink alcohol or use illicit drugs.    Review of Systems   Most recent eye exam was in 7/15 Showing no retinopathy        Lipids:  has high triglycerides mostly previously as high as 681; LDL previously also consistently high.   On Fenofibrate and not taking Lipitor 10 mg daily since his last visit Recently lipids appear to be improved       Lab Results  Component Value Date   CHOL 179 03/19/2015   HDL 38 03/19/2015   [place] 93 03/19/2015   TRIG 238* 03/19/2015                   Thyroid: He was diagnosed to have hypothyroidism in 1994 has been on medication since then He is compliant with his medication but in the last few days has run out, recent TSH is high  Lab Results  Component Value Date   TSH 8.050* 04/08/2015   TSH 0.32* 03/19/2015   TSH 1.07 08/02/2014       The blood pressure has been high, has only been on diltiazem for his atrial arrhythmia and low-dose metoprolol    LABS:  Orders Only on 04/08/2015  Component Date Value Ref Range Status  . Creatinine, Urine 04/08/2015 96.2  Not Estab. mg/dL Final  . Microalbum.,U,Random 04/08/2015 5.7  Not Estab. ug/mL Final  . MICROALB/CREAT RATIO 04/08/2015 5.9  0.0 - 30.0 mg/g creat Final  . TSH 04/08/2015 8.050* 0.450 - 4.500 uIU/mL Final  Orders Only on 04/07/2015  Component Date Value Ref  Range Status  . Glucose 04/07/2015 145* 65 - 99 mg/dL Final  . BUN 04/07/2015 17  6 - 24 mg/dL Final  . Creatinine, Ser 04/07/2015 1.07  0.76 - 1.27 mg/dL Final  . GFR calc non Af Amer 04/07/2015 78  >59 mL/min/1.73 Final  . GFR calc Af Amer 04/07/2015 90  >59 mL/min/1.73 Final  . BUN/Creatinine Ratio 04/07/2015 16  9 - 20 Final  . Sodium 04/07/2015 139  136 - 144 mmol/L Final  . Potassium 04/07/2015 4.6  3.5 - 5.2 mmol/L Final  . Chloride 04/07/2015 99  97 - 106 mmol/L Final  . CO2 04/07/2015 19  18 - 29 mmol/L Final  . Calcium 04/07/2015 9.8  8.7 - 10.2 mg/dL Final  . Total Protein 04/07/2015 6.9  6.0 - 8.5 g/dL Final  . Albumin 04/07/2015 4.8  3.5 - 5.5 g/dL Final  . Globulin, Total 04/07/2015 2.1  1.5 - 4.5 g/dL Final  . Albumin/Globulin Ratio 04/07/2015 2.3  1.1 - 2.5 Final  . Bilirubin Total 04/07/2015 0.4  0.0 - 1.2 mg/dL Final  . Alkaline Phosphatase 04/07/2015 55  39 -  117 IU/L Final  . AST 04/07/2015 19  0 - 40 IU/L Final  . ALT 04/07/2015 31  0 - 44 IU/L Final    Physical Examination:  BP 126/84 mmHg  Pulse 94  Temp(Src) 98.5 F (36.9 C)  Resp 16  Ht 6\' 2"  (1.88 m)  Wt 261 lb (118.389 kg)  BMI 33.50 kg/m2  SpO2 95%     ASSESSMENT:  Diabetes type 2, uncontrolled with obesity He is on a 3 drug regimen of metformin, Amaryl and Invokana 100 mg Again has not been consistent with his diet and exercise His A1c is pending today See history of present illness for detailed discussion of his current management, blood sugar patterns and problems identified He has difficulty being consistent with diet especially with stress and tends to have larger portions and also snacks late at night  Discussed that he has some progression of his diabetes and since he cannot lose weight may be beneficial to use a GLP-1 drugs.  Higher when he refuses to consider this now He thinks that since he is going on vacation now he will be going on vacation now he will be able to focus on his diet  and exercise and does not want to change anything  HYPERTRIGLYCERIDEMIA: This has improved regarding Lipitor LDL is also below 100 He will continue the same regimen  HYPERTENSION: He will follow-up with cardiologist  HYPOTHYROIDISM: His TSH was low normal when he was taking his is 200 g supplement daily and will need to reduce the dose slightly  PLAN:   No change in medications as yet, he may benefit from higher dose of Invokana also  He will check more readings after meals and bring them for review next month  Again discussed that if his blood sugars are starting get low in the afternoon he can split the Amaryl to a half tablet in the morning and half in the evening  May consider adding Trulicity although his insurance has restrictions on GLP-1 drugs  He will get an A1c checked today  Start walking or other exercise program regularly  Will also recommend reducing the dietitian if he is not able to lose weight  Continue Lipitor  Synthroid 200 g daily except half tablet on Sundays  Patient Instructions  Synthroid 1/2 tab once weekly with 1 on other days  Check blood sugars on waking up 3-4  times a week Also check blood sugars about 2 hours after a meal and do this after different meals by rotation  Recommended blood sugar levels on waking up is 90-130 and about 2 hours after meal is 130-160  Please bring your blood sugar monitor to each visit, thank you  Aerobic exercise daily  If am sugar >140 add 1/2 amaryl in pm      Counseling time on subjects discussed above is over 50% of today's 25 minute visit   Laria Grimmett 04/11/2015, 12:52 PM   Note: This office note was prepared with Estate agent. Any transcriptional errors that result from this process are unintentional.

## 2015-04-11 NOTE — Patient Instructions (Addendum)
Synthroid 1/2 tab once weekly with 1 on other days  Check blood sugars on waking up 3-4  times a week Also check blood sugars about 2 hours after a meal and do this after different meals by rotation  Recommended blood sugar levels on waking up is 90-130 and about 2 hours after meal is 130-160  Please bring your blood sugar monitor to each visit, thank you  Aerobic exercise daily  If am sugar >140 add 1/2 amaryl in pm

## 2015-04-14 ENCOUNTER — Other Ambulatory Visit: Payer: Self-pay | Admitting: Endocrinology

## 2015-04-16 LAB — HEMOGLOBIN A1C
ESTIMATED AVERAGE GLUCOSE: 154 mg/dL
Hgb A1c MFr Bld: 7 % — ABNORMAL HIGH (ref 4.8–5.6)

## 2015-05-09 ENCOUNTER — Ambulatory Visit: Payer: 59 | Admitting: Endocrinology

## 2015-05-16 ENCOUNTER — Ambulatory Visit (INDEPENDENT_AMBULATORY_CARE_PROVIDER_SITE_OTHER): Payer: 59 | Admitting: Internal Medicine

## 2015-05-16 ENCOUNTER — Encounter: Payer: Self-pay | Admitting: Internal Medicine

## 2015-05-16 VITALS — BP 118/68 | HR 80 | Temp 98.9°F | Ht 74.0 in | Wt 259.2 lb

## 2015-05-16 DIAGNOSIS — R52 Pain, unspecified: Secondary | ICD-10-CM | POA: Diagnosis not present

## 2015-05-16 DIAGNOSIS — B349 Viral infection, unspecified: Secondary | ICD-10-CM | POA: Diagnosis not present

## 2015-05-16 DIAGNOSIS — I480 Paroxysmal atrial fibrillation: Secondary | ICD-10-CM | POA: Diagnosis not present

## 2015-05-16 DIAGNOSIS — Z09 Encounter for follow-up examination after completed treatment for conditions other than malignant neoplasm: Secondary | ICD-10-CM

## 2015-05-16 LAB — POCT INFLUENZA A/B
Influenza A, POC: NEGATIVE
Influenza B, POC: NEGATIVE

## 2015-05-16 MED ORDER — HYDROCODONE-HOMATROPINE 5-1.5 MG/5ML PO SYRP: 5.0000 mL | ORAL_SOLUTION | Freq: Three times a day (TID) | ORAL | Status: DC | PRN

## 2015-05-16 MED ORDER — AZELASTINE HCL 0.1 % NA SOLN: 2.0000 | Freq: Two times a day (BID) | NASAL | Status: DC

## 2015-05-16 NOTE — Progress Notes (Signed)
Subjective:    Patient ID: Colin Wood, male    DOB: 03-Nov-1960, 54 y.o.   MRN: [id number]  DOS:  05/16/2015 Type of visit - description : Acute Interval history:  Symptoms started yesterday with cough, sinus pain at the maxillary area, teeth hurting. He has a small amount of clear yellow discharge and is feeling slightly achy. He took Mucinex D with some relief.   Review of Systems No fever chills No nausea vomiting No rash  Past Medical History  Diagnosis Date  . Depression     used to see psych, on no meds as of 03/2009  . Rhinitis     vasomotor  . Hypothyroidism   . Hyperlipidemia   . Gout   . GERD (gastroesophageal reflux disease)   . Persistent atrial fibrillation ([place])     PVI 12/2012  . Sleep apnea     mild, now treated with CPAP since ~9-12  . Atrial flutter ([place])     typical appearing; s/p ablation 12-2012  . Diabetes mellitus     TYPE 2    Past Surgical History  Procedure Laterality Date  . Tonsillectomy    . Vasectomy    . Nasal sinus surgery    . Cardioversion  03/01/11  . Tee without cardioversion N/A 01/04/2013    Procedure: TRANSESOPHAGEAL ECHOCARDIOGRAM (TEE);  Surgeon: [name], MD;  Location: [facility] ENDOSCOPY;  Service: Cardiovascular;  Laterality: N/A;  . Ablation of dysrhythmic focus  01/05/2013    PVI and flutter ablation by [name]  . Atrial fibrillation ablation N/A 01/05/2013    Procedure: ATRIAL FIBRILLATION ABLATION;  Surgeon: [place] Grayer, MD;  Location: [facility] CATH LAB;  Service: Cardiovascular;  Laterality: N/A;    Social History   Social History  . Marital Status: Married    Spouse Name: N/A  . Number of Children: 2  . Years of Education: N/A   Occupational History  . works at [employer] Topics  . Smoking status: Never Smoker   . Smokeless tobacco: Never Used  . Alcohol Use: No  . Drug Use: No  . Sexual Activity: Not on file   Other Topics Concern  . Not on file   Social History Narrative   Pt  lives in [place] with spouse and children 68 and 71 y/o.  Works in Engineer, technical sales at [employer].        Medication List       This list is accurate as of: 05/16/15  1:13 PM.  Always use your most recent med list.               allopurinol 300 MG tablet  Commonly known as:  ZYLOPRIM  Take 1 tablet (300 mg total) by mouth daily.     atorvastatin 10 MG tablet  Commonly known as:  LIPITOR  Take 1 tablet (10 mg total) by mouth daily.     azelastine 0.1 % nasal spray  Commonly known as:  ASTELIN  Place 2 sprays into both nostrils 2 (two) times daily.     canagliflozin 100 MG Tabs tablet  Commonly known as:  INVOKANA  Take 1 tablet (100 mg total) by mouth daily.     dexlansoprazole 60 MG capsule  Commonly known as:  DEXILANT  Take 1 capsule (60 mg total) by mouth daily.     diltiazem 240 MG 24 hr capsule  Commonly known as:  CARDIZEM CD  Take 1 capsule (240 mg total)  by mouth daily.     famotidine 20 MG tablet  Commonly known as:  PEPCID  Take 1 tablet (20 mg total) by mouth at bedtime.     fenofibrate 160 MG tablet  Take 1 tablet (160 mg total) by mouth daily.     fluticasone 50 MCG/ACT nasal spray  Commonly known as:  FLONASE  Place 2 sprays into both nostrils daily.     glimepiride 4 MG tablet  Commonly known as:  AMARYL  Take 1 tablet (4 mg total) by mouth daily with breakfast.     HYDROcodone-homatropine 5-1.5 MG/5ML syrup  Commonly known as:  HYCODAN  Take 5 mLs by mouth 3 (three) times daily as needed for cough.     levothyroxine 200 MCG tablet  Commonly known as:  SYNTHROID, LEVOTHROID  Take 1 tablet (200 mcg total) by mouth daily before breakfast.     metFORMIN 1000 MG tablet  Commonly known as:  GLUCOPHAGE  Take 1 tablet (1,000 mg total) by mouth 2 (two) times daily with a meal.     metoprolol succinate 25 MG 24 hr tablet  Commonly known as:  TOPROL-XL  Take 1 tablet (25 mg total) by mouth daily.     omega-3 acid ethyl esters 1 G capsule  Commonly known  as:  LOVAZA  Take 1 capsule 2 times a day (4G) daily     rivaroxaban 20 MG Tabs tablet  Commonly known as:  XARELTO  Take 1 tablet (20 mg total) by mouth at bedtime.           Objective:   Physical Exam BP 118/68 mmHg  Pulse 80  Temp(Src) 98.9 F (37.2 C) (Oral)  Ht 6\' 2"  (1.88 m)  Wt 259 lb 4 oz (117.595 kg)  BMI 33.27 kg/m2  SpO2 95% General:   Well developed, well nourished, slightly diaphoretic, not pale, no distress, nontoxic.  HEENT:  Normocephalic . Face symmetric, atraumatic. Nose quite congested, sinuses not TTP. Throat symmetric, no red. TMs normal Lungs:  CTA B Normal respiratory effort, no intercostal retractions, no accessory muscle use. Heart: Irregularly irregular,  no murmur.  No pretibial edema bilaterally  Skin: Not pale. Not jaundice Neurologic:  alert & oriented X3.  Speech normal, gait appropriate for age and unassisted Psych--  Cognition and judgment appear intact.  Cooperative with normal attention span and concentration.  Behavior appropriate. No anxious or depressed appearing.      Assessment & Plan:   Assessment > DM Dr. Dwyane Dee since 07-2014 Hyperlipidemia Hypothyroidism A flutter, ablation 12-2012, now paroxysmal, Xarelto GERD Depression Gout [name], CPAP since 2012  Plan Viral syndrome: flu test negative, recommend conservative treatment, see instructions. Recommend to avoid decongestants such as pseudoephedrine and phenylephrine Atrial fibrillation: Currently on aFib, denies chest pain, slightly diaphoretic but not toxic appearing. Probably diaphoretic from viral syndrome. Recommend to avoid decongestants, see instructions

## 2015-05-16 NOTE — Progress Notes (Signed)
Pre visit review using our clinic review tool, if applicable. No additional management support is needed unless otherwise documented below in the visit note. 

## 2015-05-16 NOTE — Assessment & Plan Note (Signed)
Viral syndrome: flu test negative, recommend conservative treatment, see instructions. Recommend to avoid decongestants such as pseudoephedrine and phenylephrine Atrial fibrillation: Currently on aFib, denies chest pain, slightly diaphoretic but not toxic appearing. Probably diaphoretic from viral syndrome. Recommend to avoid decongestants, see instructions

## 2015-05-16 NOTE — Patient Instructions (Signed)
Rest, fluids , tylenol  For cough: Take Mucinex DM twice a day as needed until better. Avoid  Mucinex D or anything containing a decongestant such as pseudoephedrine.  For nasal congestion continue using Flonase and Astelin  For persisting cough, take hydrocodone, will cause drowsiness   Call if not gradually better over the next   3 days.   Call anytime if the symptoms are severe  Call if you feel your atrial fibrillation is causing symptoms such as chest pain, faintness, severe palpitations.

## 2015-07-15 ENCOUNTER — Encounter: Payer: 59 | Admitting: Internal Medicine

## 2015-07-25 ENCOUNTER — Telehealth: Payer: Self-pay | Admitting: Internal Medicine

## 2015-07-25 ENCOUNTER — Other Ambulatory Visit: Payer: Self-pay | Admitting: *Deleted

## 2015-07-25 MED ORDER — RIVAROXABAN 20 MG PO TABS: 20.0000 mg | ORAL_TABLET | Freq: Every day | ORAL | Status: DC

## 2015-07-25 NOTE — Telephone Encounter (Signed)
NEW MESSAGE    *STAT* If patient is at the pharmacy, call can be transferred to refill team.   1. Which medications need to be refilled? (please list name of each medication and dose if known) Xarelto 20mg  2. Which pharmacy/location (including street and city if local pharmacy) is medication to be sent to?  [facility]    [place] 3. Do they need a 30 day or 90 day supply? [place]

## 2015-07-30 ENCOUNTER — Encounter: Payer: Self-pay | Admitting: Gastroenterology

## 2015-08-07 ENCOUNTER — Other Ambulatory Visit: Payer: Self-pay | Admitting: Internal Medicine

## 2015-08-07 ENCOUNTER — Other Ambulatory Visit: Payer: Self-pay | Admitting: Endocrinology

## 2015-08-07 ENCOUNTER — Other Ambulatory Visit: Payer: Self-pay | Admitting: Nurse Practitioner

## 2015-08-09 ENCOUNTER — Other Ambulatory Visit: Payer: Self-pay | Admitting: Nurse Practitioner

## 2015-08-14 ENCOUNTER — Encounter: Payer: Self-pay | Admitting: Internal Medicine

## 2015-08-28 ENCOUNTER — Other Ambulatory Visit: Payer: Self-pay | Admitting: Nurse Practitioner

## 2015-09-03 ENCOUNTER — Ambulatory Visit: Payer: 59 | Admitting: Internal Medicine

## 2015-09-05 ENCOUNTER — Ambulatory Visit (INDEPENDENT_AMBULATORY_CARE_PROVIDER_SITE_OTHER): Payer: 59 | Admitting: Internal Medicine

## 2015-09-05 VITALS — BP 118/74 | HR 103 | Ht 74.0 in | Wt 258.6 lb

## 2015-09-05 DIAGNOSIS — G4733 Obstructive sleep apnea (adult) (pediatric): Secondary | ICD-10-CM | POA: Diagnosis not present

## 2015-09-05 DIAGNOSIS — E669 Obesity, unspecified: Secondary | ICD-10-CM

## 2015-09-05 DIAGNOSIS — I480 Paroxysmal atrial fibrillation: Secondary | ICD-10-CM

## 2015-09-05 MED ORDER — RIVAROXABAN 20 MG PO TABS: ORAL_TABLET | ORAL | Status: DC

## 2015-09-05 MED ORDER — DILTIAZEM HCL ER COATED BEADS 240 MG PO CP24: ORAL_CAPSULE | ORAL | Status: DC

## 2015-09-05 MED ORDER — METOPROLOL SUCCINATE ER 50 MG PO TB24: 50.0000 mg | ORAL_TABLET | Freq: Every day | ORAL | Status: DC

## 2015-09-05 NOTE — Progress Notes (Signed)
PCP:  [name], MD  The patient has not been compliant with office follow-up recently.  Reports gradual worsening of his AF.  For the past few months, he thinks that he has been persistently in atrial fibrillation.  He is relatively asymptomatic with this.  He has sleep apnea but has not been successful with CPAP management.  He also continues to struggle with weight gain and lifestyle modification.  He is not very active.  He denies significant palpitations, SOB, CP, dizzyiness, presyncope, syncope or other concerns.  Past Medical History  Diagnosis Date  . Depression     used to see psych, on no meds as of 03/2009  . Rhinitis     vasomotor  . Hypothyroidism   . Hyperlipidemia   . Gout   . GERD (gastroesophageal reflux disease)   . Persistent atrial fibrillation ([place])     PVI 12/2012  . Sleep apnea     mild, now treated with CPAP since ~9-12  . Atrial flutter ([place])     typical appearing; s/p ablation 12-2012  . Diabetes mellitus     TYPE 2   Past Surgical History  Procedure Laterality Date  . Tonsillectomy    . Vasectomy    . Nasal sinus surgery    . Cardioversion  03/01/11  . Tee without cardioversion N/A 01/04/2013    Procedure: TRANSESOPHAGEAL ECHOCARDIOGRAM (TEE);  Surgeon: [name], MD;  Location: [facility] ENDOSCOPY;  Service: Cardiovascular;  Laterality: N/A;  . Ablation of dysrhythmic focus  01/05/2013    PVI and flutter ablation by [name]  . Atrial fibrillation ablation N/A 01/05/2013    Procedure: ATRIAL FIBRILLATION ABLATION;  Surgeon: Ciocca Grayer, MD;  Location: [facility] CATH LAB;  Service: Cardiovascular;  Laterality: N/A;    Current Outpatient Prescriptions  Medication Sig Dispense Refill  . allopurinol (ZYLOPRIM) 300 MG tablet Take 1 tablet (300 mg total) by mouth daily. 90 tablet 2  . atorvastatin (LIPITOR) 10 MG tablet Take 1 tablet (10 mg total) by mouth daily. 90 tablet 1  . azelastine (ASTELIN) 0.1 % nasal spray Place 2 sprays into both nostrils 2 (two) times  daily. 30 mL 0  . canagliflozin (INVOKANA) 100 MG TABS tablet Take 1 tablet (100 mg total) by mouth daily. 90 tablet 1  . dexlansoprazole (DEXILANT) 60 MG capsule Take 1 capsule (60 mg total) by mouth daily. 90 capsule 2  . diltiazem (CARDIZEM CD) 240 MG 24 hr capsule Take 1 capsule by mouth  daily 90 capsule 3  . famotidine (PEPCID) 20 MG tablet Take 1 tablet (20 mg total) by mouth at bedtime. 90 tablet 2  . fenofibrate 160 MG tablet Take 1 tablet (160 mg total) by mouth daily. 90 tablet 1  . fluticasone (FLONASE) 50 MCG/ACT nasal spray Place 2 sprays into both nostrils daily. 48 g 2  . glimepiride (AMARYL) 4 MG tablet Take 1 tablet (4 mg total) by mouth daily with breakfast. 90 tablet 1  . levothyroxine (SYNTHROID, LEVOTHROID) 200 MCG tablet Take 1 tablet (200 mcg total) by mouth daily before breakfast. 90 tablet 2  . metFORMIN (GLUCOPHAGE) 1000 MG tablet Take 1 tablet (1,000 mg total) by mouth 2 (two) times daily with a meal. 180 tablet 1  . metoprolol succinate (TOPROL-XL) 50 MG 24 hr tablet Take 1 tablet (50 mg total) by mouth daily. 90 tablet 3  . omega-3 acid ethyl esters (LOVAZA) 1 g capsule Take 2 capsules by mouth  two times daily  (4 gram  total daily) 360 capsule 0  . rivaroxaban (XARELTO) 20 MG TABS tablet Take 1 tablet by mouth at  bedtime 90 tablet 3   No current facility-administered medications for this visit.    Allergies  Allergen Reactions  . Pseudoephedrine Other (See Comments)    Dx: Atrial Fibrillation    Social History   Social History  . Marital Status: Married    Spouse Name: N/A  . Number of Children: 2  . Years of Education: N/A   Occupational History  . works at [employer] Topics  . Smoking status: Never Smoker   . Smokeless tobacco: Never Used  . Alcohol Use: No  . Drug Use: No  . Sexual Activity: Not on file   Other Topics Concern  . Not on file   Social History Narrative   Pt lives in [place] with spouse and children  82 and 45 y/o.  Works in Engineer, technical sales at [employer].    Family History  Problem Relation Age of Onset  . Diabetes Father   . Hypertension Brother   . Melanoma Brother   . Asthma Mother   . Allergies Daughter   . Throat cancer Maternal Uncle   . Colon cancer Paternal Grandfather   . Prostate cancer Neg Hx     ROS-  All systems are reviewed and are negative except as outlined in the HPI above  Physical Exam: Filed Vitals:   09/05/15 1623  BP: 118/74  Pulse: 103  Height: 6\' 2"  (1.88 m)  Weight: 258 lb 9.6 oz (117.3 kg)    GEN- The patient is well appearing, alert and oriented x 3 today.   Head- normocephalic, atraumatic Eyes-  Sclera clear, conjunctiva pink Ears- hearing intact Oropharynx- clear Neck- supple, no JVP Lymph- no cervical lymphadenopathy Lungs- Clear to ausculation bilaterally, normal work of breathing Heart- irregular rate and rhythm, no murmurs, rubs or gallops, PMI not laterally displaced GI- soft, NT, ND, + BS Extremities- no clubbing, cyanosis, or edema MS- no significant deformity or atrophy Skin- no rash or lesion Psych- euthymic mood, full affect Neuro- strength and sensation are intact  ekg today reveals afib, V rate 103 bpm  Assessment and Plan: 1. Persistent AFIb The patient has had recurrence of now persistent afib.  He has minimal symptoms. Therapeutic strategies for afib including rate and rhythm control were discussed in detail with the patient today.  We discussed repeat ablation as well as AAD therapy as options also.  At this time, he would favor rate control.  I will increase toprol today to 50mg  daily. I will also repeat echo.  If any structural changes, would favor pursuit of sinus rhythm.  If no changes, rate control may be fine long term.  Continue xarelto.  2 Lifestyle risk factors for recurrent afib Weight loss, exercise program and consistent use of CPAP encouraged.  3. [name] I will refer to Dr Maxwell Caul for assistance with sleep apnea  management per patients request.  Return to see me in 2 months  Kerschner Grayer MD, [facility]

## 2015-09-05 NOTE — Patient Instructions (Signed)
Medication Instructions:  Your physician has recommended you make the following change in your medication:  1) Increase Toprol to 50 mg daily   Labwork: None ordered   Testing/Procedures: Your physician has requested that you have an echocardiogram. Echocardiography is a painless test that uses sound waves to create images of your heart. It provides your doctor with information about the size and shape of your heart and how well your heart's chambers and valves are working. This procedure takes approximately one hour. There are no restrictions for this procedure.  You have been referred to Dr Maxwell Caul    Follow-Up: Your physician recommends that you schedule a follow-up appointment in: 2 months with [name]   Any Other Special Instructions Will Be Listed Below (If Applicable).     If you need a refill on your cardiac medications before your next appointment, please call your pharmacy.

## 2015-09-25 ENCOUNTER — Ambulatory Visit (HOSPITAL_COMMUNITY): Payer: 59 | Attending: Cardiology

## 2015-09-25 ENCOUNTER — Other Ambulatory Visit: Payer: Self-pay

## 2015-09-25 DIAGNOSIS — I4891 Unspecified atrial fibrillation: Secondary | ICD-10-CM | POA: Diagnosis present

## 2015-09-25 DIAGNOSIS — G4733 Obstructive sleep apnea (adult) (pediatric): Secondary | ICD-10-CM | POA: Diagnosis not present

## 2015-09-25 DIAGNOSIS — I517 Cardiomegaly: Secondary | ICD-10-CM | POA: Diagnosis not present

## 2015-09-25 DIAGNOSIS — E119 Type 2 diabetes mellitus without complications: Secondary | ICD-10-CM | POA: Insufficient documentation

## 2015-09-25 DIAGNOSIS — I480 Paroxysmal atrial fibrillation: Secondary | ICD-10-CM | POA: Insufficient documentation

## 2015-09-25 DIAGNOSIS — E785 Hyperlipidemia, unspecified: Secondary | ICD-10-CM | POA: Diagnosis not present

## 2015-09-25 DIAGNOSIS — I77810 Thoracic aortic ectasia: Secondary | ICD-10-CM | POA: Diagnosis not present

## 2015-09-25 DIAGNOSIS — I340 Nonrheumatic mitral (valve) insufficiency: Secondary | ICD-10-CM | POA: Insufficient documentation

## 2015-09-25 DIAGNOSIS — R29898 Other symptoms and signs involving the musculoskeletal system: Secondary | ICD-10-CM | POA: Insufficient documentation

## 2015-10-10 ENCOUNTER — Telehealth (HOSPITAL_COMMUNITY): Payer: Self-pay | Admitting: *Deleted

## 2015-10-10 NOTE — Telephone Encounter (Signed)
Left message to return call to sch appt in afib clinic. Will await call back

## 2015-10-10 NOTE — Telephone Encounter (Signed)
-----   Message from Juluis Mire, RN sent at 10/08/2015  3:31 PM EDT ----- Regarding: appt  Pt needs appt f/u EF down to 35% per allred.

## 2015-10-21 ENCOUNTER — Encounter (HOSPITAL_COMMUNITY): Payer: Self-pay | Admitting: Nurse Practitioner

## 2015-10-21 ENCOUNTER — Ambulatory Visit (HOSPITAL_COMMUNITY)
Admission: RE | Admit: 2015-10-21 | Discharge: 2015-10-21 | Disposition: A | Discharge status: Home / Self Care | Payer: 59 | Source: Ambulatory Visit | Attending: Nurse Practitioner | Admitting: Nurse Practitioner

## 2015-10-21 VITALS — BP 120/76 | HR 109 | Ht 74.0 in | Wt 263.6 lb

## 2015-10-21 DIAGNOSIS — I481 Persistent atrial fibrillation: Secondary | ICD-10-CM | POA: Diagnosis not present

## 2015-10-21 DIAGNOSIS — E119 Type 2 diabetes mellitus without complications: Secondary | ICD-10-CM | POA: Diagnosis not present

## 2015-10-21 DIAGNOSIS — I4892 Unspecified atrial flutter: Secondary | ICD-10-CM | POA: Diagnosis not present

## 2015-10-21 DIAGNOSIS — Z800 Family history of malignant neoplasm of digestive organs: Secondary | ICD-10-CM | POA: Insufficient documentation

## 2015-10-21 DIAGNOSIS — E039 Hypothyroidism, unspecified: Secondary | ICD-10-CM | POA: Diagnosis not present

## 2015-10-21 DIAGNOSIS — M109 Gout, unspecified: Secondary | ICD-10-CM | POA: Insufficient documentation

## 2015-10-21 DIAGNOSIS — E785 Hyperlipidemia, unspecified: Secondary | ICD-10-CM | POA: Insufficient documentation

## 2015-10-21 DIAGNOSIS — Z9889 Other specified postprocedural states: Secondary | ICD-10-CM | POA: Insufficient documentation

## 2015-10-21 DIAGNOSIS — Z7984 Long term (current) use of oral hypoglycemic drugs: Secondary | ICD-10-CM | POA: Diagnosis not present

## 2015-10-21 DIAGNOSIS — K219 Gastro-esophageal reflux disease without esophagitis: Secondary | ICD-10-CM | POA: Diagnosis not present

## 2015-10-21 DIAGNOSIS — Z8249 Family history of ischemic heart disease and other diseases of the circulatory system: Secondary | ICD-10-CM | POA: Diagnosis not present

## 2015-10-21 DIAGNOSIS — R000 Tachycardia, unspecified: Secondary | ICD-10-CM | POA: Insufficient documentation

## 2015-10-21 DIAGNOSIS — Z7901 Long term (current) use of anticoagulants: Secondary | ICD-10-CM | POA: Insufficient documentation

## 2015-10-21 DIAGNOSIS — Z79899 Other long term (current) drug therapy: Secondary | ICD-10-CM | POA: Insufficient documentation

## 2015-10-21 DIAGNOSIS — G4730 Sleep apnea, unspecified: Secondary | ICD-10-CM | POA: Diagnosis not present

## 2015-10-21 DIAGNOSIS — F329 Major depressive disorder, single episode, unspecified: Secondary | ICD-10-CM | POA: Insufficient documentation

## 2015-10-21 DIAGNOSIS — Z825 Family history of asthma and other chronic lower respiratory diseases: Secondary | ICD-10-CM | POA: Diagnosis not present

## 2015-10-21 DIAGNOSIS — Z8042 Family history of malignant neoplasm of prostate: Secondary | ICD-10-CM | POA: Diagnosis not present

## 2015-10-21 DIAGNOSIS — I4891 Unspecified atrial fibrillation: Secondary | ICD-10-CM | POA: Diagnosis present

## 2015-10-21 DIAGNOSIS — I4819 Other persistent atrial fibrillation: Secondary | ICD-10-CM

## 2015-10-21 DIAGNOSIS — Z833 Family history of diabetes mellitus: Secondary | ICD-10-CM | POA: Diagnosis not present

## 2015-10-21 DIAGNOSIS — Z808 Family history of malignant neoplasm of other organs or systems: Secondary | ICD-10-CM | POA: Diagnosis not present

## 2015-10-21 MED ORDER — METOPROLOL SUCCINATE ER 50 MG PO TB24: ORAL_TABLET | ORAL | Status: DC

## 2015-10-21 NOTE — Patient Instructions (Signed)
Your physician has recommended you make the following change in your medication:  1)Increase metoprolol to 75mg  at bedtime. (1 and 1/2 tablets)

## 2015-10-22 ENCOUNTER — Encounter (HOSPITAL_COMMUNITY): Payer: Self-pay | Admitting: Nurse Practitioner

## 2015-10-22 NOTE — Progress Notes (Signed)
Patient ID: Colin Wood, male   DOB: Oct 31, 1960, 55 y.o.   MRN: [id number]     Primary Care Physician: [name], MD Referring Physician: Dr. Faythe Ghee is a 55 y.o. male with a h/o PAF. Failing tikosyn in the past and as well as s/p ablation in 2014,who recently saw [name] and was found to be in persistent afib. He is asymptomatic with this. He has sleep apnea but has not been able to use cpap on a regular basis. He continues to have weight issues and is not very active. The plan was to continue rate control and to get an echo to see if any decline in EF and is so would be iinclined to pursue SR or at least make sure he is well rate controlled.   He did have an echo which did show decline in EF to 35-40%, and left atrium shows moderate enlargement.   Today, he denies symptoms of palpitations, chest pain, shortness of breath, orthopnea, PND, lower extremity edema, dizziness, presyncope, syncope, or neurologic sequela. The patient is tolerating medications without difficulties and is otherwise without complaint today.   Past Medical History  Diagnosis Date  . Depression     used to see psych, on no meds as of 03/2009  . Rhinitis     vasomotor  . Hypothyroidism   . Hyperlipidemia   . Gout   . GERD (gastroesophageal reflux disease)   . Persistent atrial fibrillation ([place])     PVI 12/2012  . Sleep apnea     mild, now treated with CPAP since ~9-12  . Atrial flutter ([place])     typical appearing; s/p ablation 12-2012  . Diabetes mellitus     TYPE 2   Past Surgical History  Procedure Laterality Date  . Tonsillectomy    . Vasectomy    . Nasal sinus surgery    . Cardioversion  03/01/11  . Tee without cardioversion N/A 01/04/2013    Procedure: TRANSESOPHAGEAL ECHOCARDIOGRAM (TEE);  Surgeon: [name], MD;  Location: [facility] ENDOSCOPY;  Service: Cardiovascular;  Laterality: N/A;  . Ablation of dysrhythmic focus  01/05/2013    PVI and flutter ablation by [name]  .  Atrial fibrillation ablation N/A 01/05/2013    Procedure: ATRIAL FIBRILLATION ABLATION;  Surgeon: Collington Grayer, MD;  Location: [facility] CATH LAB;  Service: Cardiovascular;  Laterality: N/A;    Current Outpatient Prescriptions  Medication Sig Dispense Refill  . allopurinol (ZYLOPRIM) 300 MG tablet Take 1 tablet (300 mg total) by mouth daily. 90 tablet 2  . atorvastatin (LIPITOR) 10 MG tablet Take 1 tablet (10 mg total) by mouth daily. 90 tablet 1  . canagliflozin (INVOKANA) 100 MG TABS tablet Take 1 tablet (100 mg total) by mouth daily. 90 tablet 1  . dexlansoprazole (DEXILANT) 60 MG capsule Take 1 capsule (60 mg total) by mouth daily. 90 capsule 2  . diltiazem (CARDIZEM CD) 240 MG 24 hr capsule Take 1 capsule by mouth  daily 90 capsule 3  . famotidine (PEPCID) 20 MG tablet Take 1 tablet (20 mg total) by mouth at bedtime. 90 tablet 2  . fenofibrate 160 MG tablet Take 1 tablet (160 mg total) by mouth daily. 90 tablet 1  . fluticasone (FLONASE) 50 MCG/ACT nasal spray Place 2 sprays into both nostrils daily. 48 g 2  . glimepiride (AMARYL) 4 MG tablet Take 1 tablet (4 mg total) by mouth daily with breakfast. 90 tablet 1  . levothyroxine (SYNTHROID, LEVOTHROID)  200 MCG tablet Take 1 tablet (200 mcg total) by mouth daily before breakfast. 90 tablet 2  . metFORMIN (GLUCOPHAGE) 1000 MG tablet Take 1 tablet (1,000 mg total) by mouth 2 (two) times daily with a meal. 180 tablet 1  . metoprolol succinate (TOPROL-XL) 50 MG 24 hr tablet Take 1 and 1/2 tablets by mouth daily at bedtime 90 tablet 3  . omega-3 acid ethyl esters (LOVAZA) 1 g capsule Take 2 capsules by mouth  two times daily  (4 gram total daily) 360 capsule 0  . rivaroxaban (XARELTO) 20 MG TABS tablet Take 1 tablet by mouth at  bedtime 90 tablet 3  . azelastine (ASTELIN) 0.1 % nasal spray Place 2 sprays into both nostrils 2 (two) times daily. (Patient not taking: Reported on 10/21/2015) 30 mL 0   No current facility-administered medications for this  encounter.    Allergies  Allergen Reactions  . Pseudoephedrine Other (See Comments)    Dx: Atrial Fibrillation    Social History   Social History  . Marital Status: Married    Spouse Name: N/A  . Number of Children: 2  . Years of Education: N/A   Occupational History  . works at [employer] Topics  . Smoking status: Never Smoker   . Smokeless tobacco: Never Used  . Alcohol Use: No  . Drug Use: No  . Sexual Activity: Not on file   Other Topics Concern  . Not on file   Social History Narrative   Pt lives in [place] with spouse and children 72 and 12 y/o.  Works in Engineer, technical sales at [employer].    Family History  Problem Relation Age of Onset  . Diabetes Father   . Hypertension Brother   . Melanoma Brother   . Asthma Mother   . Allergies Daughter   . Throat cancer Maternal Uncle   . Colon cancer Paternal Grandfather   . Prostate cancer Neg Hx     ROS- All systems are reviewed and negative except as per the HPI above  Physical Exam: Filed Vitals:   10/21/15 1526  BP: 120/76  Pulse: 109  Height: 6\' 2"  (1.88 m)  Weight: 263 lb 9.6 oz (119.568 kg)    GEN- The patient is well appearing, alert and oriented x 3 today.   Head- normocephalic, atraumatic Eyes-  Sclera clear, conjunctiva pink Ears- hearing intact Oropharynx- clear Neck- supple, no JVP Lymph- no cervical lymphadenopathy Lungs- Clear to ausculation bilaterally, normal work of breathing Heart-Irregular rate and rhythm, no murmurs, rubs or gallops, PMI not laterally displaced GI- soft, NT, ND, + BS Extremities- no clubbing, cyanosis, or edema MS- no significant deformity or atrophy Skin- no rash or lesion Psych- euthymic mood, full affect Neuro- strength and sensation are intact  EKG-afib with rvr at 109 ms, qrs int 112 ms, qtc 474 ms Epic records reviewed ECHO-Left ventricle: The cavity size was normal. Wall thickness was  normal. Systolic function was moderately reduced. The  estimated  ejection fraction was in the range of 35% to 40%. There is  akinesis of the mid-apicalanteroseptal myocardium. - Aorta: Aortic root dimension: 41 mm (ED). - Ascending aorta: The ascending aorta was mildly dilated. - Mitral valve: Calcified annulus. There was mild regurgitation. - Left atrium: The atrium was moderately dilated.  Anterior-posterior dimension: 48 mm. Volume/bsa, ES, (1-plane  Simpson&'s, A2C): 43.5 ml/m^2. Left atrium 48 mm  Impressions:  - Prior echocardiogram - normal EF.   Assessment and Plan: 1.  Persistent  asymptomatic AF with possible TMC with EF 35-40% Increase metoprolol succinate 75 mg a day for better rate control Would like to restore SR with reduced EF Talked to pt re genetic af and he is in agreement to have blood work to see if he qualifies for the trial If not, will revisit with [name] if pt would be a repeat ablation candidate, failed tikosyn in the past and is on the young side for amiodarone unless used for short term. Continue xarelto and diltiazem  2. Lifestyle factors Encouraged exercise, weight loss Pending an appointment with Dr. Maxwell Caul to discuss sleep apnea and cpap management  F/u will be after seeing if pt qualifies for research protocol   Butch Penny C. [place], [facility] [address] [place],  [address] [phone]

## 2015-10-29 ENCOUNTER — Encounter: Payer: Self-pay | Admitting: *Deleted

## 2015-10-29 DIAGNOSIS — Z006 Encounter for examination for normal comparison and control in clinical research program: Secondary | ICD-10-CM

## 2015-10-29 NOTE — Progress Notes (Signed)
  Late entry:   Genetic AF Informed Consent  Subject Name: Colin Wood  Subject met inclusion and exclusion criteria. The informed consent form, study requirements and expectations were reviewed with the subject and questions and concerns were addressed prior to the signing of the consent form. The subject verbalized understanding of the trial requirements. The subject agreed to participate in the Genetic AF trial and signed the informed consent. The informed consent was obtained prior to performance of any protocol-specific procedures for the subject. A copy of the signed informed consent was given to the subject and a copy was placed in the subject's medical record.   Jake Bathe, RN 10/23/2015 [phone]

## 2015-11-12 ENCOUNTER — Other Ambulatory Visit: Payer: Self-pay | Admitting: Endocrinology

## 2015-11-12 ENCOUNTER — Other Ambulatory Visit: Payer: Self-pay | Admitting: Internal Medicine

## 2015-11-13 ENCOUNTER — Encounter: Payer: Self-pay | Admitting: *Deleted

## 2015-11-22 ENCOUNTER — Other Ambulatory Visit: Payer: Self-pay | Admitting: Endocrinology

## 2015-11-24 ENCOUNTER — Encounter: Payer: Self-pay | Admitting: Internal Medicine

## 2015-11-24 ENCOUNTER — Encounter: Payer: Self-pay | Admitting: *Deleted

## 2015-11-24 ENCOUNTER — Ambulatory Visit (INDEPENDENT_AMBULATORY_CARE_PROVIDER_SITE_OTHER): Payer: 59 | Admitting: Internal Medicine

## 2015-11-24 ENCOUNTER — Telehealth: Payer: Self-pay | Admitting: Internal Medicine

## 2015-11-24 VITALS — BP 110/70 | HR 105 | Ht 74.0 in | Wt 261.2 lb

## 2015-11-24 DIAGNOSIS — I481 Persistent atrial fibrillation: Secondary | ICD-10-CM

## 2015-11-24 DIAGNOSIS — I480 Paroxysmal atrial fibrillation: Secondary | ICD-10-CM

## 2015-11-24 DIAGNOSIS — E669 Obesity, unspecified: Secondary | ICD-10-CM

## 2015-11-24 DIAGNOSIS — G4733 Obstructive sleep apnea (adult) (pediatric): Secondary | ICD-10-CM

## 2015-11-24 DIAGNOSIS — I4819 Other persistent atrial fibrillation: Secondary | ICD-10-CM

## 2015-11-24 DIAGNOSIS — Z006 Encounter for examination for normal comparison and control in clinical research program: Secondary | ICD-10-CM

## 2015-11-24 MED ORDER — LISINOPRIL 5 MG PO TABS
5.0000 mg | ORAL_TABLET | Freq: Every day | ORAL | Status: DC
Start: 2015-11-24 — End: 2016-09-09

## 2015-11-24 MED ORDER — METOPROLOL SUCCINATE ER 100 MG PO TB24: 100.0000 mg | ORAL_TABLET | Freq: Every day | ORAL | Status: DC

## 2015-11-24 MED ORDER — LISINOPRIL 5 MG PO TABS: 5.0000 mg | ORAL_TABLET | Freq: Every day | ORAL | Status: DC

## 2015-11-24 NOTE — Progress Notes (Signed)
Electrophysiology Office Note   Date:  11/24/2015   ID:  Rahem, Dunner 1960/06/15, MRN [id number]  PCP:  [name], MD   Primary Electrophysiologist: Lingelbach Grayer, MD    Chief Complaint  Patient presents with  . Atrial Fibrillation     History of Present Illness: Colin Wood is a 55 y.o. male who presents today for electrophysiology evaluation.   He remains in afib.  Did not have appropriate gene for GENETIC AF trial.  Reports SOB with moderate activity.  Occasional tachypalpitations.  Decreased exercise tolerance with mowing lawn this weekend.  Today, he denies symptoms of chest pain,  orthopnea, PND, lower extremity edema, claudication, dizziness, presyncope, syncope, bleeding, or neurologic sequela. The patient is tolerating medications without difficulties and is otherwise without complaint today.    Past Medical History  Diagnosis Date  . Depression     used to see psych, on no meds as of 03/2009  . Rhinitis     vasomotor  . Hypothyroidism   . Hyperlipidemia   . Gout   . GERD (gastroesophageal reflux disease)   . Persistent atrial fibrillation ([place])     PVI 12/2012  . Sleep apnea     mild, now treated with CPAP since ~9-12  . Atrial flutter ([place])     typical appearing; s/p ablation 12-2012  . Diabetes mellitus     TYPE 2   Past Surgical History  Procedure Laterality Date  . Tonsillectomy    . Vasectomy    . Nasal sinus surgery    . Cardioversion  03/01/11  . Tee without cardioversion N/A 01/04/2013    Procedure: TRANSESOPHAGEAL ECHOCARDIOGRAM (TEE);  Surgeon: [name], MD;  Location: [facility] ENDOSCOPY;  Service: Cardiovascular;  Laterality: N/A;  . Ablation of dysrhythmic focus  01/05/2013    PVI and flutter ablation by [name]  . Atrial fibrillation ablation N/A 01/05/2013    Procedure: ATRIAL FIBRILLATION ABLATION;  Surgeon: Zeiders Grayer, MD;  Location: [facility] CATH LAB;  Service: Cardiovascular;  Laterality: N/A;     Current Outpatient  Prescriptions  Medication Sig Dispense Refill  . allopurinol (ZYLOPRIM) 300 MG tablet Take 1 tablet (300 mg total) by mouth daily. 90 tablet 0  . atorvastatin (LIPITOR) 10 MG tablet Take 1 tablet (10 mg total) by mouth daily. 90 tablet 1  . azelastine (ASTELIN) 0.1 % nasal spray Place 2 sprays into both nostrils 2 (two) times daily. 30 mL 0  . canagliflozin (INVOKANA) 100 MG TABS tablet Take 1 tablet (100 mg total) by mouth daily. 90 tablet 1  . dexlansoprazole (DEXILANT) 60 MG capsule Take 1 capsule (60 mg total) by mouth daily. 90 capsule 2  . diltiazem (CARDIZEM CD) 240 MG 24 hr capsule Take 1 capsule by mouth  daily 90 capsule 3  . famotidine (PEPCID) 20 MG tablet Take 1 tablet (20 mg total) by mouth at bedtime. 90 tablet 0  . fenofibrate 160 MG tablet Take 1 tablet (160 mg total) by mouth daily. 90 tablet 1  . fluticasone (FLONASE) 50 MCG/ACT nasal spray Place 2 sprays into both nostrils daily. 48 g 2  . glimepiride (AMARYL) 4 MG tablet Take 1 tablet (4 mg total) by mouth daily with breakfast. 90 tablet 1  . levothyroxine (SYNTHROID, LEVOTHROID) 200 MCG tablet Take 1 tablet (200 mcg total) by mouth daily before breakfast. 90 tablet 0  . metFORMIN (GLUCOPHAGE) 1000 MG tablet Take 1 tablet (1,000 mg total) by mouth 2 (two) times daily with  a meal. 180 tablet 1  . metoprolol succinate (TOPROL-XL) 50 MG 24 hr tablet Take 1 and 1/2 tablets by mouth daily at bedtime 90 tablet 3  . omega-3 acid ethyl esters (LOVAZA) 1 g capsule Take 2 capsules by mouth  two times daily  (4 gram total daily) 360 capsule 0  . rivaroxaban (XARELTO) 20 MG TABS tablet Take 1 tablet by mouth at  bedtime 90 tablet 3   No current facility-administered medications for this visit.    Allergies:   Pseudoephedrine   Social History:  The patient  reports that he has never smoked. He has never used smokeless tobacco. He reports that he does not drink alcohol or use illicit drugs.   Family History:  The patient's  family  history includes Allergies in his daughter; Asthma in his mother; Colon cancer in his paternal grandfather; Diabetes in his father; Hypertension in his brother; Melanoma in his brother; Throat cancer in his maternal uncle. There is no history of Prostate cancer.    ROS:  Please see the history of present illness.   All other systems are reviewed and negative.    PHYSICAL EXAM: VS:  BP 110/70 mmHg  Pulse 105  Ht 6\' 2"  (1.88 m)  Wt 261 lb 3.2 oz (118.48 kg)  BMI 33.52 kg/m2  SpO2 97% , BMI Body mass index is 33.52 kg/(m^2). GEN: Well nourished, well developed, in no acute distress HEENT: normal Neck: no JVD, carotid bruits, or masses Cardiac: iRRR; no murmurs, rubs, or gallops,no edema  Respiratory:  clear to auscultation bilaterally, normal work of breathing GI: soft, nontender, nondistended, + BS MS: no deformity or atrophy Skin: warm and dry  Neuro:  Strength and sensation are intact Psych: euthymic mood, full affect  EKG:  EKG is ordered today. The ekg ordered today shows afib, V rate 105 bpm, incomplete RBBB   Recent Labs: 03/19/2015: Hemoglobin 15.8; Platelets 215 04/07/2015: ALT 31; BUN 17; Creatinine, Ser 1.07; Potassium 4.6; Sodium 139 04/08/2015: TSH 8.050*    Lipid Panel     Component Value Date/Time   CHOL 179 03/19/2015   TRIG 238* 03/19/2015   HDL 38 03/19/2015   LDLCALC 93 03/19/2015     Wt Readings from Last 3 Encounters:  11/24/15 261 lb 3.2 oz (118.48 kg)  10/21/15 263 lb 9.6 oz (119.568 kg)  09/05/15 258 lb 9.6 oz (117.3 kg)     ASSESSMENT AND PLAN:  Assessment and Plan: 1. Persistent AFIb The patient has had recurrence of now persistent afib.  Given reduced EF which is likely tachy mediated, I think that we should aggressively pursue sinus rhythm. Therapeutic strategies for afib including medicine, surgical MAZE, and ablation were discussed in detail with the patient today.  I think our best medicine option would be tikosyn or amiodarone.  At  this time, he is leaning towards referral for MAZE.  He would like to think about this further.  He would require RHC/LHC prior to referral to Dr Roxy Manns.  He will follow-up with Butch Penny for further discussions in 2 weeks.   I will increase toprol today to 100mg  daily. Continue xarelto.  2 Lifestyle risk factors for recurrent afib Weight loss, exercise program and consistent use of CPAP encouraged.  3. [name] He has not yet seen Dr Maxwell Caul but has an appointment in the next few weeks  4. Acute systolic dysfunction Likely tachycardia mediated Increase toprol Add lisinopril Anticipate cath to further assess cors in the near future once he makes a  decision about his afib management scheme  Follow-up with Butch Penny in 2 weeks  Current medicines are reviewed at length with the patient today.   The patient does not have concerns regarding his medicines.  The following changes were made today:  none   Signed, Colin Grayer, MD  11/24/2015 8:30 AM     [facility] [address] [place]  [place] [address] [phone] (office) [phone] (fax)

## 2015-11-24 NOTE — Patient Instructions (Signed)
Medication Instructions:  Your physician has recommended you make the following change in your medication:  1) Increase Metoprolol to 100 mg daily 2) Start Lisinopril 5 mg daily    Labwork: None ordered   Testing/Procedures: None ordered   Follow-Up: Your physician recommends that you schedule a follow-up appointment in: 2 weeks with Roderic Palau, NP    Any Other Special Instructions Will Be Listed Below (If Applicable).     If you need a refill on your cardiac medications before your next appointment, please call your pharmacy.

## 2015-11-24 NOTE — Progress Notes (Signed)
Based on lab results subject did not have the ARG/ARG gene.  Therefore a screen failure for the Genetic AF research study.

## 2015-11-24 NOTE — Addendum Note (Signed)
Addended by: Janan Halter F on: 11/24/2015 08:38 AM   Modules accepted: Orders

## 2015-11-24 NOTE — Telephone Encounter (Signed)
Spoke with wife and they are discussing being referred to Dr Roxy Manns in regards to the MAZE procedure.  When she looked it up on-line the words "open heart" scared her.  I told her they they do make several incisions and that that could be why they call it open heart.  I explained that they should set up consult to discuss all questions with Dr Roxy Manns as he is who does the procedures.  She is going to talk with him to night and they will call me back to scheduled referral if decide to proceed.

## 2015-11-24 NOTE — Telephone Encounter (Signed)
New Message:   Please call,she have some questions about the procedure he is thinking about having.

## 2015-11-28 ENCOUNTER — Telehealth: Payer: Self-pay | Admitting: Internal Medicine

## 2015-11-28 ENCOUNTER — Other Ambulatory Visit: Payer: Self-pay | Admitting: *Deleted

## 2015-11-28 MED ORDER — METOPROLOL SUCCINATE ER 100 MG PO TB24: 100.0000 mg | ORAL_TABLET | Freq: Every day | ORAL | Status: DC

## 2015-11-28 NOTE — Telephone Encounter (Signed)
lmtcb  (ok to speak to wife per DPR)

## 2015-11-28 NOTE — Telephone Encounter (Signed)
Rx sent as requested.

## 2015-11-28 NOTE — Telephone Encounter (Signed)
They would like referral to discuss MAZE procedure. Will forward to [name] for referral order. Wife is aware someone will contact them to schedule this once ordered by physician.

## 2015-11-28 NOTE — Telephone Encounter (Signed)
New Message  Pt wife requested to speak w/ rN about scheduling MAZE procedure. Please call back and discuss.

## 2015-11-28 NOTE — Telephone Encounter (Signed)
°*  STAT* If patient is at the pharmacy, call can be transferred to refill team.   1. Which medications need to be refilled? (please list name of each medication and dose if known)Metoprolol  Succinate100mg    2. Which pharmacy/location (including street and city if local pharmacy) is [facility] [address]   3. Do they need a 30 day or 90 day supply? Colin Wood

## 2015-11-29 NOTE — Telephone Encounter (Signed)
As per my note, will need RHC/LHC prior to seeing Dr Roxy Manns.  Please schedule for cath and then referral to Dr Roxy Manns. Will need to hold anticoagulation prior to cath.

## 2015-12-03 NOTE — Telephone Encounter (Signed)
lmtcb to arrange

## 2015-12-04 NOTE — Telephone Encounter (Signed)
Follow up ° ° ° ° ° °Returning a call to the nurse °

## 2015-12-05 NOTE — Telephone Encounter (Signed)
Advised patient he will need a heart cath prior to seeing Dr. Roxy Manns. He understands referral w/ Roxy Manns will occur after cath completed. Will schedule cath week of July 17-21st, per pt request. I will call him with instructions today/Monday. Referral will be placed once cath scheduled. Patient verbalized understanding and agreeable to plan.

## 2015-12-05 NOTE — Telephone Encounter (Signed)
F/U   Pt returning call. Please advise. IB 

## 2015-12-05 NOTE — Telephone Encounter (Signed)
Follow Up    Pt called stating that Monday, July 10th is not a good time/day for the procedure. Please call.

## 2015-12-05 NOTE — Telephone Encounter (Signed)
PT  CANCELLING   CATH HAS  TO WORK  WAS  WANTING CATH DONE  FOLLOWING WEEK.ALSO WISHES  TO  DISCUSS WITH  WIFE  AND  HAVE  SHERI CALL BACK   ON Monday.   CATH LAB  CALLED AND  CATH  CANCELLED .Colin Wood

## 2015-12-08 ENCOUNTER — Encounter (HOSPITAL_COMMUNITY): Admission: RE | Payer: Self-pay | Source: Ambulatory Visit

## 2015-12-08 ENCOUNTER — Ambulatory Visit (HOSPITAL_COMMUNITY): Admission: RE | Admit: 2015-12-08 | Payer: 59 | Source: Ambulatory Visit | Admitting: Cardiovascular Disease

## 2015-12-08 SURGERY — RIGHT/LEFT HEART CATH AND CORONARY ANGIOGRAPHY
Anesthesia: LOCAL

## 2015-12-08 NOTE — Telephone Encounter (Signed)
Patient wants to schedule cath for 8/14.  He is being told he will have a $2000 copay for this procedure.  Patient advised I will arrange cath and call him with details this week/next. He is agreeable to plan.

## 2015-12-11 ENCOUNTER — Telehealth: Payer: Self-pay | Admitting: Internal Medicine

## 2015-12-11 DIAGNOSIS — I4819 Other persistent atrial fibrillation: Secondary | ICD-10-CM

## 2015-12-11 NOTE — Telephone Encounter (Signed)
Follow Up:  Returning call,Sherri said you were handling this.

## 2015-12-12 MED ORDER — METOPROLOL SUCCINATE ER 100 MG PO TB24: 100.0000 mg | ORAL_TABLET | Freq: Every day | ORAL | Status: DC

## 2015-12-12 NOTE — Telephone Encounter (Signed)
Spoke with patient and will schedule for week of 8/14 with [place]   I have his cath scheduled for 8/14 at 7:30am---I have left message for patient with this information and to call me to confirm Labs 8/7 anytime  Will be at the [facility] on 8/14 at 5:30am NPO after MN No medications the morning of procedure HOLD Xarelto for 48 hours prior

## 2015-12-12 NOTE — Telephone Encounter (Signed)
F/u   Pt returning nurses call.

## 2015-12-12 NOTE — Telephone Encounter (Signed)
Wants to have labs done through [facility].  I have written prescription and will leave it up front for him.  I will also make the referral to Dr Roxy Manns for MAZE

## 2016-01-01 ENCOUNTER — Telehealth: Payer: Self-pay

## 2016-01-01 ENCOUNTER — Other Ambulatory Visit: Payer: Self-pay

## 2016-01-01 ENCOUNTER — Telehealth: Payer: Self-pay | Admitting: Endocrinology

## 2016-01-01 ENCOUNTER — Telehealth: Payer: Self-pay | Admitting: Internal Medicine

## 2016-01-01 MED ORDER — OMEGA-3-ACID ETHYL ESTERS 1 G PO CAPS: ORAL_CAPSULE | ORAL | 0 refills | Status: DC

## 2016-01-01 MED ORDER — CANAGLIFLOZIN 100 MG PO TABS: 100.0000 mg | ORAL_TABLET | Freq: Every day | ORAL | 1 refills | Status: DC

## 2016-01-01 MED ORDER — ATORVASTATIN CALCIUM 10 MG PO TABS
10.0000 mg | ORAL_TABLET | Freq: Every day | ORAL | 1 refills | Status: DC
Start: 2016-01-01 — End: 2016-01-01

## 2016-01-01 MED ORDER — ATORVASTATIN CALCIUM 10 MG PO TABS: 10.0000 mg | ORAL_TABLET | Freq: Every day | ORAL | 1 refills | Status: DC

## 2016-01-01 NOTE — Telephone Encounter (Signed)
PT needs refills on Invokana, Omega-3, and Atorvastatin sent to [facility].  Also, requests lab orders be mailed to him for him to have them drawn prior to his 02/04/16 appointment.

## 2016-01-01 NOTE — Telephone Encounter (Signed)
error 

## 2016-01-01 NOTE — Telephone Encounter (Signed)
Sent in patient refill request. Will mail our lab orders to patient to take with him to get labs drawn before appointment.

## 2016-01-05 ENCOUNTER — Other Ambulatory Visit: Payer: Self-pay | Admitting: Internal Medicine

## 2016-01-05 ENCOUNTER — Other Ambulatory Visit: Payer: 59

## 2016-01-06 ENCOUNTER — Encounter: Payer: Self-pay | Admitting: Internal Medicine

## 2016-01-06 LAB — CBC WITH DIFFERENTIAL/PLATELET
Basophils Absolute: 0 10*3/uL (ref 0.0–0.2)
Basos: 0 %
EOS (ABSOLUTE): 0.1 10*3/uL (ref 0.0–0.4)
Eos: 2 %
Hematocrit: 44.4 % (ref 37.5–51.0)
Hemoglobin: 15.5 g/dL (ref 12.6–17.7)
IMMATURE GRANULOCYTES: 0 %
Immature Grans (Abs): 0 10*3/uL (ref 0.0–0.1)
LYMPHS: 35 %
Lymphocytes Absolute: 2 10*3/uL (ref 0.7–3.1)
MCH: 29.6 pg (ref 26.6–33.0)
MCHC: 34.9 g/dL (ref 31.5–35.7)
MCV: 85 fL (ref 79–97)
MONOCYTES: 9 %
Monocytes Absolute: 0.5 10*3/uL (ref 0.1–0.9)
NEUTROS ABS: 3.1 10*3/uL (ref 1.4–7.0)
NEUTROS PCT: 54 %
PLATELETS: 192 10*3/uL (ref 150–379)
RBC: 5.24 x10E6/uL (ref 4.14–5.80)
RDW: 13.6 % (ref 12.3–15.4)
WBC: 5.8 10*3/uL (ref 3.4–10.8)

## 2016-01-06 LAB — BASIC METABOLIC PANEL
BUN / CREAT RATIO: 18 (ref 9–20)
BUN: 21 mg/dL (ref 6–24)
CALCIUM: 10.1 mg/dL (ref 8.7–10.2)
CHLORIDE: 95 mmol/L — AB (ref 96–106)
CO2: 26 mmol/L (ref 18–29)
Creatinine, Ser: 1.19 mg/dL (ref 0.76–1.27)
GFR calc non Af Amer: 68 mL/min/{1.73_m2} (ref >59)
GFR, EST AFRICAN AMERICAN: 78 mL/min/{1.73_m2} (ref >59)
Glucose: 180 mg/dL — ABNORMAL HIGH (ref 65–99)
POTASSIUM: 4.9 mmol/L (ref 3.5–5.2)
SODIUM: 137 mmol/L (ref 134–144)

## 2016-01-08 ENCOUNTER — Telehealth: Payer: Self-pay | Admitting: Internal Medicine

## 2016-01-08 NOTE — Telephone Encounter (Signed)
Spoke with patient who is calling for information regarding heart cath scheduled for Monday 8/14.  I reviewed pre-procedure instructions with him including: arrive @ 0530 @ [facility] entrance @ [facility], NPO after midnight the night before, hold Xarelto 48 hours per note from Janan Halter, RN on 8/3, and to hold metformin 24 hours before/48 hours after procedure and hold invokana and glimepiride on the morning of the procedure. I reviewed lab work with him and advised that it has not been reviewed by [name].  There were no abnormal values other than patient's glucose of 180.  He verbalized understanding and agreement and thanked me for my help.

## 2016-01-08 NOTE — Telephone Encounter (Signed)
Mr. Renaud is calling because he had some lab work done on 01/05/16 and is wanting to know the results and have a question about the procedure schedule for 01/12/16 . Please call    Thanks

## 2016-01-12 ENCOUNTER — Encounter (HOSPITAL_COMMUNITY): Payer: Self-pay | Admitting: Internal Medicine

## 2016-01-12 ENCOUNTER — Ambulatory Visit (HOSPITAL_COMMUNITY)
Admission: RE | Admit: 2016-01-12 | Discharge: 2016-01-12 | Disposition: A | Discharge status: Home / Self Care | Payer: 59 | Source: Ambulatory Visit | Attending: Cardiovascular Disease | Admitting: Cardiovascular Disease

## 2016-01-12 ENCOUNTER — Encounter (HOSPITAL_COMMUNITY)
Admission: RE | Disposition: A | Discharge status: Home / Self Care | Payer: Self-pay | Source: Ambulatory Visit | Attending: Cardiovascular Disease

## 2016-01-12 DIAGNOSIS — K219 Gastro-esophageal reflux disease without esophagitis: Secondary | ICD-10-CM | POA: Diagnosis not present

## 2016-01-12 DIAGNOSIS — Z7901 Long term (current) use of anticoagulants: Secondary | ICD-10-CM | POA: Diagnosis not present

## 2016-01-12 DIAGNOSIS — Z8249 Family history of ischemic heart disease and other diseases of the circulatory system: Secondary | ICD-10-CM | POA: Insufficient documentation

## 2016-01-12 DIAGNOSIS — Z7951 Long term (current) use of inhaled steroids: Secondary | ICD-10-CM | POA: Diagnosis not present

## 2016-01-12 DIAGNOSIS — E119 Type 2 diabetes mellitus without complications: Secondary | ICD-10-CM | POA: Diagnosis not present

## 2016-01-12 DIAGNOSIS — F329 Major depressive disorder, single episode, unspecified: Secondary | ICD-10-CM | POA: Insufficient documentation

## 2016-01-12 DIAGNOSIS — R0602 Shortness of breath: Secondary | ICD-10-CM | POA: Diagnosis not present

## 2016-01-12 DIAGNOSIS — Z800 Family history of malignant neoplasm of digestive organs: Secondary | ICD-10-CM | POA: Insufficient documentation

## 2016-01-12 DIAGNOSIS — E039 Hypothyroidism, unspecified: Secondary | ICD-10-CM | POA: Insufficient documentation

## 2016-01-12 DIAGNOSIS — I481 Persistent atrial fibrillation: Secondary | ICD-10-CM | POA: Diagnosis not present

## 2016-01-12 DIAGNOSIS — I483 Typical atrial flutter: Secondary | ICD-10-CM | POA: Diagnosis not present

## 2016-01-12 DIAGNOSIS — Z7984 Long term (current) use of oral hypoglycemic drugs: Secondary | ICD-10-CM | POA: Diagnosis not present

## 2016-01-12 DIAGNOSIS — M109 Gout, unspecified: Secondary | ICD-10-CM | POA: Insufficient documentation

## 2016-01-12 DIAGNOSIS — E785 Hyperlipidemia, unspecified: Secondary | ICD-10-CM | POA: Diagnosis not present

## 2016-01-12 DIAGNOSIS — J310 Chronic rhinitis: Secondary | ICD-10-CM | POA: Diagnosis not present

## 2016-01-12 DIAGNOSIS — I4891 Unspecified atrial fibrillation: Secondary | ICD-10-CM | POA: Diagnosis present

## 2016-01-12 DIAGNOSIS — G4733 Obstructive sleep apnea (adult) (pediatric): Secondary | ICD-10-CM | POA: Insufficient documentation

## 2016-01-12 HISTORY — PX: CARDIAC CATHETERIZATION: SHX172

## 2016-01-12 LAB — PROTIME-INR
INR: 0.95
PROTHROMBIN TIME: 12.6 s (ref 11.4–15.2)

## 2016-01-12 LAB — POCT I-STAT 3, ART BLOOD GAS (G3+)
ACID-BASE DEFICIT: 3 mmol/L — AB (ref 0.0–2.0)
Bicarbonate: 22.4 mEq/L (ref 20.0–24.0)
O2 Saturation: 92 %
PH ART: 7.369 (ref 7.350–7.450)
TCO2: 24 mmol/L (ref 0–100)
pCO2 arterial: 38.9 mmHg (ref 35.0–45.0)
pO2, Arterial: 65 mmHg — ABNORMAL LOW (ref 80.0–100.0)

## 2016-01-12 LAB — GLUCOSE, CAPILLARY
GLUCOSE-CAPILLARY: 160 mg/dL — AB (ref 65–99)
Glucose-Capillary: 201 mg/dL — ABNORMAL HIGH (ref 65–99)

## 2016-01-12 LAB — POCT I-STAT 3, VENOUS BLOOD GAS (G3P V)
Acid-base deficit: 2 mmol/L (ref 0.0–2.0)
Bicarbonate: 24.1 mEq/L — ABNORMAL HIGH (ref 20.0–24.0)
O2 SAT: 64 %
PCO2 VEN: 43.5 mmHg — AB (ref 45.0–50.0)
TCO2: 25 mmol/L (ref 0–100)
pH, Ven: 7.351 — ABNORMAL HIGH (ref 7.250–7.300)
pO2, Ven: 35 mmHg (ref 31.0–45.0)

## 2016-01-12 SURGERY — RIGHT/LEFT HEART CATH AND CORONARY ANGIOGRAPHY
Anesthesia: LOCAL

## 2016-01-12 MED ORDER — SODIUM CHLORIDE 0.9% FLUSH: 3.0000 mL | INTRAVENOUS | Status: DC | PRN

## 2016-01-12 MED ORDER — RIVAROXABAN 20 MG PO TABS: ORAL_TABLET | ORAL | 3 refills | Status: DC

## 2016-01-12 MED ORDER — HEPARIN (PORCINE) IN NACL 2-0.9 UNIT/ML-% IJ SOLN
INTRAMUSCULAR | Status: DC | PRN
  Administered 2016-01-12: 1000 mL

## 2016-01-12 MED ORDER — LIDOCAINE HCL (PF) 1 % IJ SOLN
INTRAMUSCULAR | Status: DC | PRN
  Administered 2016-01-12: 15 mL via INTRADERMAL

## 2016-01-12 MED ORDER — SODIUM CHLORIDE 0.9 % IV SOLN: INTRAVENOUS | Status: AC

## 2016-01-12 MED ORDER — SODIUM CHLORIDE 0.9 % WEIGHT BASED INFUSION
3.0000 mL/kg/h | INTRAVENOUS | Status: AC
  Administered 2016-01-12: 3 mL/kg/h via INTRAVENOUS

## 2016-01-12 MED ORDER — MIDAZOLAM HCL 2 MG/2ML IJ SOLN
INTRAMUSCULAR | Status: DC | PRN
  Administered 2016-01-12 (×2): 1 mg via INTRAVENOUS

## 2016-01-12 MED ORDER — SODIUM CHLORIDE 0.9 % WEIGHT BASED INFUSION: 1.0000 mL/kg/h | INTRAVENOUS | Status: DC

## 2016-01-12 MED ORDER — LIDOCAINE HCL (PF) 1 % IJ SOLN
INTRAMUSCULAR | Status: AC
  Filled 2016-01-12: qty 30

## 2016-01-12 MED ORDER — METFORMIN HCL 1000 MG PO TABS: 1000.0000 mg | ORAL_TABLET | Freq: Two times a day (BID) | ORAL | 1 refills | Status: DC

## 2016-01-12 MED ORDER — HEPARIN SODIUM (PORCINE) 1000 UNIT/ML IJ SOLN
INTRAMUSCULAR | Status: AC
Start: 2016-01-12 — End: 2016-01-12
  Filled 2016-01-12: qty 1

## 2016-01-12 MED ORDER — ASPIRIN 81 MG PO CHEW
CHEWABLE_TABLET | ORAL | Status: AC
  Administered 2016-01-12: 81 mg via ORAL
  Filled 2016-01-12: qty 1

## 2016-01-12 MED ORDER — SODIUM CHLORIDE 0.9% FLUSH: 3.0000 mL | Freq: Two times a day (BID) | INTRAVENOUS | Status: DC

## 2016-01-12 MED ORDER — SODIUM CHLORIDE 0.9 % IV SOLN: 250.0000 mL | INTRAVENOUS | Status: DC | PRN

## 2016-01-12 MED ORDER — IOPAMIDOL (ISOVUE-370) INJECTION 76%
INTRAVENOUS | Status: DC | PRN
  Administered 2016-01-12: 50 mL via INTRA_ARTERIAL

## 2016-01-12 MED ORDER — VERAPAMIL HCL 2.5 MG/ML IV SOLN
INTRAVENOUS | Status: AC
  Filled 2016-01-12: qty 2

## 2016-01-12 MED ORDER — MIDAZOLAM HCL 2 MG/2ML IJ SOLN
INTRAMUSCULAR | Status: AC
  Filled 2016-01-12: qty 2

## 2016-01-12 MED ORDER — SODIUM CHLORIDE 0.9% FLUSH
3.0000 mL | Freq: Two times a day (BID) | INTRAVENOUS | Status: DC
Start: 2016-01-12 — End: 2016-01-12

## 2016-01-12 MED ORDER — HEPARIN (PORCINE) IN NACL 2-0.9 UNIT/ML-% IJ SOLN
INTRAMUSCULAR | Status: AC
  Filled 2016-01-12: qty 1000

## 2016-01-12 MED ORDER — FENTANYL CITRATE (PF) 100 MCG/2ML IJ SOLN
INTRAMUSCULAR | Status: AC
  Filled 2016-01-12: qty 2

## 2016-01-12 MED ORDER — ASPIRIN 81 MG PO CHEW
81.0000 mg | CHEWABLE_TABLET | ORAL | Status: AC
  Administered 2016-01-12: 81 mg via ORAL

## 2016-01-12 MED ORDER — FENTANYL CITRATE (PF) 100 MCG/2ML IJ SOLN
INTRAMUSCULAR | Status: DC | PRN
  Administered 2016-01-12 (×2): 50 ug via INTRAVENOUS

## 2016-01-12 MED ORDER — IOPAMIDOL (ISOVUE-370) INJECTION 76%
INTRAVENOUS | Status: AC
  Filled 2016-01-12: qty 100

## 2016-01-12 SURGICAL SUPPLY — 12 items
CATH INFINITI 5FR MULTPACK ANG (CATHETERS) ×2 IMPLANT
CATH SWAN GANZ 7F STRAIGHT (CATHETERS) ×2 IMPLANT
GLIDESHEATH SLEND SS 6F .021 (SHEATH) IMPLANT
KIT HEART LEFT (KITS) ×2 IMPLANT
KIT HEART RIGHT NAMIC (KITS) ×2 IMPLANT
PACK CARDIAC CATHETERIZATION (CUSTOM PROCEDURE TRAY) ×2 IMPLANT
SHEATH FAST CATH BRACH 5F 5CM (SHEATH) IMPLANT
SHEATH PINNACLE 5F 10CM (SHEATH) ×2 IMPLANT
SHEATH PINNACLE 7F 10CM (SHEATH) ×2 IMPLANT
TRANSDUCER W/STOPCOCK (MISCELLANEOUS) ×2 IMPLANT
TUBING CIL FLEX 10 FLL-RA (TUBING) IMPLANT
WIRE SAFE-T 1.5MM-J .035X260CM (WIRE) ×2 IMPLANT

## 2016-01-12 NOTE — Discharge Instructions (Signed)
Angiogram, Care After °Refer to this sheet in the next few weeks. These instructions provide you with information about caring for yourself after your procedure. Your health care provider may also give you more specific instructions. Your treatment has been planned according to current medical practices, but problems sometimes occur. Call your health care provider if you have any problems or questions after your procedure. °WHAT TO EXPECT AFTER THE PROCEDURE °After your procedure, it is typical to have the following: °· Bruising at the catheter insertion site that usually fades within 1-2 weeks. °· Blood collecting in the tissue (hematoma) that may be painful to the touch. It should usually decrease in size and tenderness within 1-2 weeks. °HOME CARE INSTRUCTIONS °· Take medicines only as directed by your health care provider. °· You may shower 24-48 hours after the procedure or as directed by your health care provider. Remove the bandage (dressing) and gently wash the site with plain soap and water. Pat the area dry with a clean towel. Do not rub the site, because this may cause bleeding. °· Do not take baths, swim, or use a hot tub until your health care provider approves. °· Check your insertion site every day for redness, swelling, or drainage. °· Do not apply powder or lotion to the site. °· Do not lift over 10 lb (4.5 kg) for 5 days after your procedure or as directed by your health care provider. °· Ask your health care provider when it is okay to: °¨ Return to work or school. °¨ Resume usual physical activities or sports. °¨ Resume sexual activity. °· Do not drive home if you are discharged the same day as the procedure. Have someone else drive you. °· You may drive 24 hours after the procedure unless otherwise instructed by your health care provider. °· Do not operate machinery or power tools for 24 hours after the procedure or as directed by your health care provider. °· If your procedure was done as an  outpatient procedure, which means that you went home the same day as your procedure, a responsible adult should be with you for the first 24 hours after you arrive home. °· Keep all follow-up visits as directed by your health care provider. This is important. °SEEK MEDICAL CARE IF: °· You have a fever. °· You have chills. °· You have increased bleeding from the catheter insertion site. Hold pressure on the site. °SEEK IMMEDIATE MEDICAL CARE IF: °· You have unusual pain at the catheter insertion site. °· You have redness, warmth, or swelling at the catheter insertion site. °· You have drainage (other than a small amount of blood on the dressing) from the catheter insertion site. °· The catheter insertion site is bleeding, and the bleeding does not stop after 30 minutes of holding steady pressure on the site. °· The area near or just beyond the catheter insertion site becomes pale, cool, tingly, or numb. °  °This information is not intended to replace advice given to you by your health care provider. Make sure you discuss any questions you have with your health care provider. °  °Document Released: 12/03/2004 Document Revised: 06/07/2014 Document Reviewed: 10/18/2012 °Elsevier Interactive Patient Education ©2016 [employer] ° °

## 2016-01-12 NOTE — Progress Notes (Signed)
Site area: rfa/rfv Site Prior to Removal:  Level 0 Pressure Applied For:63min Manual: yes   Patient Status During Pull:  stable Post Pull Site:  Level  Post Pull Instructions Given:  yes Post Pull Pulses Present: palpable Dressing Applied:  tegaderm Bedrest begins @ 0915 till 1315 Comments:

## 2016-01-12 NOTE — Interval H&P Note (Signed)
History and Physical Interval Note:  01/12/2016 7:20 AM  Colin Wood  has presented today for cardiac cath with the diagnosis of atrial fibrillation/pre-surgical clearance  The various methods of treatment have been discussed with the patient and family. After consideration of risks, benefits and other options for treatment, the patient has consented to  Procedure(s): Right/Left Heart Cath and Coronary Angiography (N/A) as a surgical intervention .  The patient's history has been reviewed, patient examined, no change in status, stable for surgery.  I have reviewed the patient's chart and labs.  Questions were answered to the patient's satisfaction.    Cath Lab Visit (complete for each Cath Lab visit)  Clinical Evaluation Leading to the Procedure:   ACS: No.  Non-ACS:    Anginal Classification: CCS II   Anti-ischemic medical therapy: Maximal Therapy (2 or more classes of medications)  Non-Invasive Test Results: No non-invasive testing performed  Prior CABG: No previous CABG         Lauree Chandler

## 2016-01-12 NOTE — H&P (Signed)
Patient ID: KAIYDEN AFTAB MRN: [id number] DOB/AGE: [id number] y.o. Admit date: 01/12/2016  Primary Care Physician: [name], MD Primary Cardiologist: Allred  HPI: 55 y.o. male with history of atrial fibrillation, DM, GERD, HLD, [name] who is being followed by [name] for his atrial fibrillation. He is being referred for surgical MAZE procedure. He is here today for cardiac cath prior to surgical referral.  Review of systems complete and found to be negative unless listed above   Past Medical History:  Diagnosis Date  . Atrial flutter ([place])    typical appearing; s/p ablation 12-2012  . Depression    used to see psych, on no meds as of 03/2009  . Diabetes mellitus    TYPE 2  . GERD (gastroesophageal reflux disease)   . Gout   . Hyperlipidemia   . Hypothyroidism   . Persistent atrial fibrillation ([place])    PVI 12/2012  . Rhinitis    vasomotor  . Sleep apnea    mild, now treated with CPAP since ~9-12    Family History  Problem Relation Age of Onset  . Diabetes Father   . Hypertension Brother   . Melanoma Brother   . Asthma Mother   . Allergies Daughter   . Throat cancer Maternal Uncle   . Colon cancer Paternal Grandfather   . Prostate cancer Neg Hx     Social History   Social History  . Marital status: Married    Spouse name: N/A  . Number of children: 2  . Years of education: N/A   Occupational History  . works at [employer]  . Smoking status: Never Smoker  . Smokeless tobacco: Never Used  . Alcohol use No  . Drug use: No  . Sexual activity: Not on file   Other Topics Concern  . Not on file   Social History Narrative   Pt lives in [place] with spouse and children 24 and 45 y/o.  Works in Engineer, technical sales at [employer].    Past Surgical History:  Procedure Laterality Date  . ABLATION OF DYSRHYTHMIC FOCUS  01/05/2013   PVI and flutter ablation by [name]  . ATRIAL FIBRILLATION ABLATION N/A 01/05/2013   Procedure: ATRIAL  FIBRILLATION ABLATION;  Surgeon: Rumpf Grayer, MD;  Location: [facility] CATH LAB;  Service: Cardiovascular;  Laterality: N/A;  . CARDIOVERSION  03/01/11  . NASAL SINUS SURGERY    . TEE WITHOUT CARDIOVERSION N/A 01/04/2013   Procedure: TRANSESOPHAGEAL ECHOCARDIOGRAM (TEE);  Surgeon: [name], MD;  Location: [facility] ENDOSCOPY;  Service: Cardiovascular;  Laterality: N/A;  . TONSILLECTOMY    . VASECTOMY      No Known Allergies  Prior to Admission Meds:  Prior to Admission medications   Medication Sig Start Date End Date Taking? Authorizing Provider  allopurinol (ZYLOPRIM) 300 MG tablet Take 1 tablet (300 mg total) by mouth daily. 11/12/15  Yes Colon Branch, MD  atorvastatin (LIPITOR) 10 MG tablet Take 1 tablet (10 mg total) by mouth daily. 01/01/16  Yes Elayne Snare, MD  azelastine (ASTELIN) 0.1 % nasal spray Place 2 sprays into both nostrils 2 (two) times daily. 05/16/15  Yes Colon Branch, MD  canagliflozin (INVOKANA) 100 MG TABS tablet Take 1 tablet (100 mg total) by mouth daily. 01/01/16  Yes Elayne Snare, MD  dexlansoprazole (DEXILANT) 60 MG capsule Take 1 capsule (60 mg total) by mouth daily. 03/12/15  Yes Colon Branch, MD  diltiazem [facility]  CD) 240 MG 24 hr capsule Take 1 capsule by mouth  daily 09/05/15  Yes Reicher Grayer, MD  famotidine (PEPCID) 20 MG tablet Take 1 tablet (20 mg total) by mouth at bedtime. 11/12/15  Yes Colon Branch, MD  fenofibrate 160 MG tablet Take 1 tablet (160 mg total) by mouth daily. 08/07/15  Yes Colon Branch, MD  fluticasone [facility]) 50 MCG/ACT nasal spray Place 2 sprays into both nostrils daily. 03/12/15  Yes Colon Branch, MD  glimepiride (AMARYL) 4 MG tablet Take 1 tablet (4 mg total) by mouth daily with breakfast. 04/11/15  Yes Elayne Snare, MD  levothyroxine (SYNTHROID, LEVOTHROID) 200 MCG tablet Take 1 tablet (200 mcg total) by mouth daily before breakfast. 11/12/15  Yes Colon Branch, MD  lisinopril (PRINIVIL,ZESTRIL) 5 MG tablet Take 1 tablet (5 mg total) by mouth daily. 11/24/15  Yes [place] Grayer, MD  metFORMIN (GLUCOPHAGE) 1000 MG tablet Take 1 tablet (1,000 mg total) by mouth 2 (two) times daily with a meal. 04/11/15  Yes Elayne Snare, MD  metoprolol succinate (TOPROL-XL) 100 MG 24 hr tablet Take 1 tablet (100 mg total) by mouth daily. 12/12/15  Yes Gobin Grayer, MD  omega-3 acid ethyl esters (LOVAZA) 1 g capsule Take 2 capsules by mouth  two times daily  (4 gram total daily) 01/01/16  Yes Elayne Snare, MD  rivaroxaban (XARELTO) 20 MG TABS tablet Take 1 tablet by mouth at  bedtime 09/05/15  Yes Nieblas Grayer, MD    Physical Exam: Blood pressure 127/81, pulse 89, temperature 98.8 F (37.1 C), temperature source Oral, resp. rate 18, height 6\' 2"  (1.88 m), weight 255 lb (115.7 kg), SpO2 97 %.    General: Well developed, well nourished, NAD  HEENT: OP clear, mucus membranes moist  SKIN: warm, dry. No rashes.  Neuro: No focal deficits  Musculoskeletal: Muscle strength 5/5 all ext  Psychiatric: Mood and affect normal  Neck: No JVD, no carotid bruits, no thyromegaly, no lymphadenopathy.  Lungs:Clear bilaterally, no wheezes, rhonci, crackles  Cardiovascular: Irreg irreg. No murmurs, gallops or rubs.  Abdomen:Soft. Bowel sounds present. Non-tender.  Extremities: No lower extremity edema. Pulses are 2 + in the bilateral DP/PT.   Labs:   Lab Results  Component Value Date   WBC 5.8 01/05/2016   HGB 15.8 03/19/2015   HCT 44.4 01/05/2016   MCV 85 01/05/2016   PLT 192 01/05/2016    Recent Labs Lab 01/05/16 0815  NA 137  K 4.9  CL 95*  CO2 26  BUN 21  CREATININE 1.19  CALCIUM 10.1  GLUCOSE 180*     ASSESSMENT AND PLAN:   1. Atrial fibrillation: Pt being referred for surgical MAZE procedure. Right and left heart cath today prior to surgical referral. He has been consented for possible PCI if necessary.   Darlina Guys, MD 01/12/2016, 7:14 AM

## 2016-01-13 ENCOUNTER — Encounter: Payer: 59 | Admitting: Thoracic Surgery (Cardiothoracic Vascular Surgery)

## 2016-01-13 MED FILL — Verapamil HCl IV Soln 2.5 MG/ML: INTRAVENOUS | Qty: 2 | Status: AC

## 2016-01-20 ENCOUNTER — Encounter: Payer: 59 | Admitting: Thoracic Surgery (Cardiothoracic Vascular Surgery)

## 2016-01-23 ENCOUNTER — Other Ambulatory Visit: Payer: Self-pay | Admitting: Thoracic Surgery (Cardiothoracic Vascular Surgery)

## 2016-01-23 ENCOUNTER — Institutional Professional Consult (permissible substitution) (INDEPENDENT_AMBULATORY_CARE_PROVIDER_SITE_OTHER): Payer: 59 | Admitting: Thoracic Surgery (Cardiothoracic Vascular Surgery)

## 2016-01-23 ENCOUNTER — Other Ambulatory Visit: Payer: Self-pay | Admitting: *Deleted

## 2016-01-23 ENCOUNTER — Encounter: Payer: Self-pay | Admitting: Thoracic Surgery (Cardiothoracic Vascular Surgery)

## 2016-01-23 VITALS — BP 114/82 | HR 108 | Resp 18 | Ht 74.0 in | Wt 255.0 lb

## 2016-01-23 DIAGNOSIS — I4891 Unspecified atrial fibrillation: Secondary | ICD-10-CM

## 2016-01-23 DIAGNOSIS — I481 Persistent atrial fibrillation: Secondary | ICD-10-CM

## 2016-01-23 DIAGNOSIS — I4819 Other persistent atrial fibrillation: Secondary | ICD-10-CM

## 2016-01-23 DIAGNOSIS — I7100 Dissection of unspecified site of aorta: Secondary | ICD-10-CM

## 2016-01-23 NOTE — Patient Instructions (Signed)
Continue all previous medications without any changes at this time  

## 2016-01-23 NOTE — Progress Notes (Signed)
[address]       [place] [address]             [place] REPORT  Referring Provider is [name], MD PCP is [name], MD  Chief Complaint  Patient presents with  . Atrial Fibrillation    Surgical eval for possiblel MAZE procedure, Cardiac Cath 01/12/16, ECHO 09/25/15    HPI:  Patient is a 55 year old obese white male with history of chronic persistent atrial fibrillation that has failed both medical therapy and catheter-based ablation who has been referred for surgical consultation to discuss further treatment options for management of atrial fibrillation. The patient's history of atrial fibrillation dates back to July 2012 when he was found to be in atrial fibrillation on routine follow-up EKG performed by his primary care physician. He was referred for cardiology consultation and initially evaluated by [name].  An echocardiogram performed at that time revealed normal left ventricular systolic function. The patient was suspected to have obstructive sleep apnea and referred for sleep consult which confirmed the presence of obstructive sleep apnea. He has been treated using CPAP ever since. He was eventually referred to [name] who has been following him for several years for atrial fibrillation. He has failed medical therapy on Tikosyn and has been cardioverted on 2 occasions, neither of which worked.  He underwent catheter-based Afib ablation in 2014 and did well for approximately 6 months until atrial fibrillation recurred.  He was seen in follow-up recently by [name] and echocardiogram revealed further enlargement of the left atrium with what appeared to be significant drop in left ventricular ejection fraction. Treatment options were discussed and the patient has subsequently been referred for surgical consultation to consider possible Maze procedure.  The patient is married and lives locally in [place]  with his wife. 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  He lives a sedentary lifestyle and admits that he does not exercise at all on a regular basis. He has been moderately obese for much of his adult life. He has not been successful in losing any significant amount of weight. He describes a slow gradual progression of symptoms of decreased energy with occasional exertional shortness of breath. He has palpitations. He denies any history of chest pain or chest tightness either with activity or at rest. He has never had any resting shortness of breath, PND, orthopnea. He has chronic problems sleeping at night. He is uncertain whether not CPAP seems to help much. He also has chronic problems with reflux, chronic cough and chronic sinus congestion.  He has been chronically anticoagulated using Xarelto without any significant bleeding complications.  Past Medical History:  Diagnosis Date  . Atrial flutter ([place])    typical appearing; s/p ablation 12-2012  . Depression    used to see psych, on no meds as of 03/2009  . Diabetes mellitus    TYPE 2  . GERD (gastroesophageal reflux disease)   . Gout   . Hyperlipidemia   . Hypothyroidism   . Persistent atrial fibrillation ([place])    PVI 12/2012  . Rhinitis    vasomotor  . Sleep apnea    mild, now treated with CPAP since ~9-12    Past Surgical History:  Procedure Laterality Date  . ABLATION OF DYSRHYTHMIC FOCUS  01/05/2013   PVI and flutter ablation by [name]  . ATRIAL FIBRILLATION ABLATION N/A 01/05/2013   Procedure: ATRIAL FIBRILLATION  ABLATION;  Surgeon: Boldon Grayer, MD;  Location: [facility] CATH LAB;  Service: Cardiovascular;  Laterality: N/A;  . CARDIAC CATHETERIZATION N/A 01/12/2016   Procedure: Right/Left Heart Cath and Coronary Angiography;  Surgeon: [name], MD;  Location: [facility] CV LAB;  Service: Cardiovascular;  Laterality: N/A;  . CARDIOVERSION  03/01/11  . NASAL SINUS SURGERY    . TEE WITHOUT CARDIOVERSION N/A 01/04/2013    Procedure: TRANSESOPHAGEAL ECHOCARDIOGRAM (TEE);  Surgeon: [name], MD;  Location: [facility] ENDOSCOPY;  Service: Cardiovascular;  Laterality: N/A;  . TONSILLECTOMY    . VASECTOMY      Family History  Problem Relation Age of Onset  . Diabetes Father   . Hypertension Brother   . Melanoma Brother   . Asthma Mother   . Allergies Daughter   . Throat cancer Maternal Uncle   . Colon cancer Paternal Grandfather   . Prostate cancer Neg Hx     Social History   Social History  . Marital status: Married    Spouse name: N/A  . Number of children: 2  . Years of education: N/A   Occupational History  . works at [employer] Topics  . Smoking status: Never Smoker  . Smokeless tobacco: Never Used  . Alcohol use No  . Drug use: No  . Sexual activity: Not on file   Other Topics Concern  . Not on file   Social History Narrative   Pt lives in [place] with spouse and children 15 and 80 y/o.  Works in Engineer, technical sales at [employer] Refill  . allopurinol (ZYLOPRIM) 300 MG tablet Take 1 tablet (300 mg total) by mouth daily. 90 tablet 0  . atorvastatin (LIPITOR) 10 MG tablet Take 1 tablet (10 mg total) by mouth daily. 90 tablet 1  . azelastine (ASTELIN) 0.1 % nasal spray Place 2 sprays into both nostrils 2 (two) times daily. 30 mL 0  . canagliflozin (INVOKANA) 100 MG TABS tablet Take 1 tablet (100 mg total) by mouth daily. 90 tablet 1  . dexlansoprazole (DEXILANT) 60 MG capsule Take 1 capsule (60 mg total) by mouth daily. 90 capsule 2  . diltiazem (CARDIZEM CD) 240 MG 24 hr capsule Take 1 capsule by mouth  daily 90 capsule 3  . famotidine (PEPCID) 20 MG tablet Take 1 tablet (20 mg total) by mouth at bedtime. 90 tablet 0  . fenofibrate 160 MG tablet Take 1 tablet (160 mg total) by mouth daily. 90 tablet 1  . fluticasone (FLONASE) 50 MCG/ACT nasal spray Place 2 sprays into both nostrils daily. 48 g 2  . glimepiride (AMARYL) 4  MG tablet Take 1 tablet (4 mg total) by mouth daily with breakfast. 90 tablet 1  . levothyroxine (SYNTHROID, LEVOTHROID) 200 MCG tablet Take 1 tablet (200 mcg total) by mouth daily before breakfast. 90 tablet 0  . lisinopril (PRINIVIL,ZESTRIL) 5 MG tablet Take 1 tablet (5 mg total) by mouth daily. 90 tablet 3  . metFORMIN (GLUCOPHAGE) 1000 MG tablet Take 1 tablet (1,000 mg total) by mouth 2 (two) times daily with a meal. 180 tablet 1  . metoprolol succinate (TOPROL-XL) 100 MG 24 hr tablet Take 1 tablet (100 mg total) by mouth daily. 90 tablet 3  . omega-3 acid ethyl esters (LOVAZA) 1 g capsule Take 2 capsules by mouth  two times daily  (4 gram total daily) 360 capsule 0  . rivaroxaban (XARELTO) 20 MG TABS  tablet Take 1 tablet by mouth at  bedtime 90 tablet 3   No current facility-administered medications for this visit.     No Known Allergies    Review of Systems:   General:  normal appetite, decreased energy, no weight gain, + slight weight loss, no fever  Cardiac:  no chest pain with exertion, no chest pain at rest, + SOB with exertion, no resting SOB, no PND, no orthopnea, + palpitations, + arrhythmia, +  atrial fibrillation, no LE edema, no dizzy spells, no syncope  Respiratory:  no shortness of breath, no home oxygen, no productive cough, no dry cough, no bronchitis, no wheezing, no hemoptysis, no asthma, no pain with inspiration or cough, + sleep apnea, + CPAP at night  GI:   no difficulty swallowing, + reflux, no frequent heartburn, no hiatal hernia, no abdominal pain, no constipation, no diarrhea, no hematochezia, no hematemesis, no melena  GU:   no dysuria,  no frequency, no urinary tract infection, no hematuria, no enlarged prostate, no kidney stones, no kidney disease  Vascular:  no pain suggestive of claudication, no pain in feet, no leg cramps, no varicose veins, no DVT, no non-healing foot ulcer  Neuro:   no stroke, no TIA's, no seizures, no headaches, no temporary blindness one  eye,  no slurred speech, no peripheral neuropathy, no chronic pain, no instability of gait, no memory/cognitive dysfunction  Musculoskeletal: no arthritis, no joint swelling, no myalgias, no difficulty walking, normal mobility   Skin:   no rash, no itching, no skin infections, no pressure sores or ulcerations  Psych:   no anxiety, no depression, no nervousness, no unusual recent stress  Eyes:   no blurry vision, no floaters, no recent vision changes, no wears glasses or contacts  ENT:   no hearing loss, no loose or painful teeth, no dentures, last saw dentist < 6 months ago, + chronic sinus congestion  Hematologic:  no easy bruising, no abnormal bleeding, no clotting disorder, no frequent epistaxis  Endocrine:  + diabetes, does check CBG's at home, last Hgb a1c reportedly 7.8     Physical Exam:   BP 114/82 (BP Location: Left Arm, Patient Position: Sitting, Cuff Size: Large)   Pulse (!) 108   Resp 18   Ht 6\' 2"  (1.88 m)   Wt 255 lb (115.7 kg)   SpO2 97% Comment: RA  BMI 32.74 kg/m   General:  Moderately obese,  well-appearing  HEENT:  Unremarkable   Neck:   no JVD, no bruits, no adenopathy   Chest:   clear to auscultation, symmetrical breath sounds, no wheezes, no rhonchi   CV:   Irregular rate and rhythm, no murmur   Abdomen:  soft, non-tender, no masses   Extremities:  warm, well-perfused, pulses diminished but palpable, no LE edema  Rectal/GU  Deferred  Neuro:   Grossly non-focal and symmetrical throughout  Skin:   Clean and dry, no rashes, no breakdown   Diagnostic Tests:  Echocardiography  Patient:    Colin Wood, Wisely MR #:       [id number] Study Date: 09/25/2015 Gender:     M Age:        54 Height:     188 cm Weight:     117.3 kg BSA:        2.51 m^2 Pt. Status: Room:   ATTENDING    Candee Furbish, M.D.  ORDERING     Seyfried Grayer, MD  REFERRING    Kapler Grayer, MD  PERFORMING  [facility], Outpatient  SONOGRAPHER  [facility],  RDCS  cc:  ------------------------------------------------------------------- LV EF: 35% -   40%  ------------------------------------------------------------------- Indications:      Atrial Fibrillation (I48.91).  ------------------------------------------------------------------- History:   PMH:  Obstructive Sleep Apnea, PFO detected by TEE and Bubble.  Atrial fibrillation.  Atrial flutter.  Risk factors: Diabetes mellitus. Dyslipidemia.  ------------------------------------------------------------------- Study Conclusions  - Left ventricle: The cavity size was normal. Wall thickness was   normal. Systolic function was moderately reduced. The estimated   ejection fraction was in the range of 35% to 40%. There is   akinesis of the mid-apicalanteroseptal myocardium. - Aorta: Aortic root dimension: 41 mm (ED). - Ascending aorta: The ascending aorta was mildly dilated. - Mitral valve: Calcified annulus. There was mild regurgitation. - Left atrium: The atrium was moderately dilated.   Anterior-posterior dimension: 48 mm. Volume/bsa, ES, (1-plane   Simpson&'s, A2C): 43.5 ml/m^2.  Impressions:  - Prior echocardiogram - normal EF.  Echocardiography.  M-mode, complete 2D, spectral Doppler, and color Doppler.  Birthdate:  Patient birthdate: 1960/12/15.  Age:  Patient is 55 yr old.  Sex:  Gender: male.    BMI: 33.2 kg/m^2.  Blood pressure:     118/74  Patient status:  Outpatient.  Study date: Study date: 09/25/2015. Study time: 07:23 AM.  Location:  [facility]  -------------------------------------------------------------------  ------------------------------------------------------------------- Left ventricle:  The cavity size was normal. Wall thickness was normal. Systolic function was moderately reduced. The estimated ejection fraction was in the range of 35% to 40%.  Regional wall motion abnormalities:   There is akinesis of the mid-apicalanteroseptal  myocardium.  ------------------------------------------------------------------- Aortic valve:   Trileaflet; normal thickness leaflets. Mobility was not restricted.  Doppler:  Transvalvular velocity was within the normal range. There was no stenosis. There was no regurgitation.   ------------------------------------------------------------------- Aorta:  Ascending aorta: The ascending aorta was mildly dilated.  ------------------------------------------------------------------- Mitral valve:   Calcified annulus. Mobility was not restricted. Doppler:  Transvalvular velocity was within the normal range. There was no evidence for stenosis. There was mild regurgitation.  ------------------------------------------------------------------- Left atrium:  The atrium was moderately dilated.  ------------------------------------------------------------------- Right ventricle:  The cavity size was normal. Wall thickness was normal. Systolic function was normal.  ------------------------------------------------------------------- Pulmonic valve:    Doppler:  Transvalvular velocity was within the normal range. There was no evidence for stenosis. There was trivial regurgitation.  ------------------------------------------------------------------- Tricuspid valve:   Structurally normal valve.    Doppler: Transvalvular velocity was within the normal range. There was mild regurgitation.  ------------------------------------------------------------------- Pulmonary artery:   The main pulmonary artery was normal-sized. Systolic pressure was within the normal range.  ------------------------------------------------------------------- Right atrium:  The atrium was normal in size.  ------------------------------------------------------------------- Pericardium:  There was no pericardial effusion.  ------------------------------------------------------------------- Systemic  veins: Inferior vena cava: The vessel was normal in size. The respirophasic diameter changes were in the normal range (= 50%), consistent with normal central venous pressure. Diameter: 16.6 mm.  ------------------------------------------------------------------- Measurements   IVC                                    Value        Reference  ID                                     16.6  mm     ---------  Left ventricle                         Value        Reference  LV ID, ED, PLAX chordal        (H)     57.1  mm     43 - 52  LV ID, ES, PLAX chordal        (H)     45.1  mm     23 - 38  LV fx shortening, PLAX chordal (L)     21    %      >=29  LV PW thickness, ED                    10.3  mm     ---------  IVS/LV PW ratio, ED                    1.12         <=1.3  Stroke volume, 2D                      96    ml     ---------  Stroke volume/bsa, 2D                  38    ml/m^2 ---------    Ventricular septum                     Value        Reference  IVS thickness, ED                      11.5  mm     ---------    LVOT                                   Value        Reference  LVOT ID, S                             27    mm     ---------  LVOT area                              5.73  cm^2   ---------  LVOT peak velocity, S                  83    cm/s   ---------  LVOT mean velocity, S                  56.5  cm/s   ---------  LVOT VTI, S                            16.7  cm     ---------    Aorta                                  Value        Reference  Aortic root ID, ED  41    mm     ---------  Ascending aorta ID, A-P, S             37    mm     ---------    Left atrium                            Value        Reference  LA ID, A-P, ES                         48    mm     ---------  LA ID/bsa, A-P                         1.91  cm/m^2 <=2.2  LA volume, S                           86.6  ml     ---------  LA volume/bsa, S                       34.5  ml/m^2 ---------   LA volume, ES, 1-p A4C                 68    ml     ---------  LA volume/bsa, ES, 1-p A4C             27.1  ml/m^2 ---------  LA volume, ES, 1-p A2C                 109   ml     ---------  LA volume/bsa, ES, 1-p A2C             43.5  ml/m^2 ---------    Tricuspid valve                        Value        Reference  Tricuspid regurg peak velocity         226   cm/s   ---------  Tricuspid peak RV-RA gradient          20    mm Hg  ---------    Right ventricle                        Value        Reference  TAPSE                                  16.7  mm     ---------  Legend: (L)  and  (H)  mark values outside specified reference range.  ------------------------------------------------------------------- Prepared and Electronically Authenticated by  Candee Furbish, M.D. 2017-04-27T15:01:12   Right/Left Heart Cath and Coronary Angiography  Conclusion   1.  No angiographically significant coronary artery disease. 2.  Normal left and right heart filling pressures. 3.  Normal cardiac output.  Plan: 1.  Continue management of atrial fibrillation per [name].  Indications   Persistent atrial fibrillation (HCC) [I48.1 (ICD-10-CM)]  Shortness of breath [R06.02 (ICD-10-CM)]  Procedural Details/Technique   Technical Details Indication: 55 y/o man with history of recurrent persistent atrial fibrillation s/p ablation referred for LHC/RHC in anticipation of possible  Maze procedure. Patient reports fatigue with accompanying decline in LVEF following recurrence of atrial fibrillation.  Procedure: The risks, benefits, complications, treatment options, and expected outcomes were discussed with the patient. The patient and/or family concurred with the proposed plan, giving informed consent. The patient was brought to the cath lab after IV hydration was begun and oral premedication was given. The patient was further sedated with Versed and Fentanyl. The right groin was prepped and draped in the  usual manner. Using the modified Seldinger access technique, a 5 French sheath was placed in the right femoral artery.  Selective coronary angiography was performed using 5 [place] JL4 and JR4 catheters to engage the left and right coronary arteries, respectively. Left heart catheterization was performed using a 5 Fench pigtail catheter.  There were no immediate complications. The patient was taken to the recovery area in stable condition.    Estimated blood loss <50 mL. . During this procedure the patient was administered the following to achieve and maintain moderate conscious sedation: Versed 2 mg, Fentanyl 100 mcg, while the patient's heart rate, blood pressure, and oxygen saturation were continuously monitored. The period of conscious sedation was 43 minutes, of which I was present face-to-face 100% of this time.    Coronary Findings   Dominance: Right  Left Anterior Descending  Vessel is large.  First Diagonal Branch  Vessel is moderate in size.  Second Diagonal Branch  Vessel is small in size.  Third Diagonal Branch  Vessel is small in size.  Ramus Intermedius  Vessel is small.  Left Circumflex  Vessel is large.  First Obtuse Marginal Branch  Vessel is small in size.  Second Obtuse Marginal Branch  Vessel is large in size.  Third Obtuse Marginal Branch  Vessel is moderate in size.  Right Coronary Artery  Vessel is large.  Right Posterior Descending Artery  Vessel is large in size.  Right Heart   Right Heart Pressures Normal left and right heart filling pressures. Normal cardiac output/index.    Left Heart   Left Ventricle LV end diastolic pressure is normal.    Coronary Diagrams   Diagnostic Diagram     Implants     No implant documentation for this case.  PACS Images   Show images for Cardiac catheterization   Link to Procedure Log   Procedure Log    Hemo Data   Flowsheet Row Most Recent Value  Fick Cardiac Output 5.43 L/min  Fick Cardiac Output  Index 2.25 (L/min)/BSA  Thermal Cardiac Output 7.57 L/min  Thermal Cardiac Output Index 3.14 (L/min)/BSA  RA A Wave -99 mmHg  RA V Wave 8 mmHg  RA Mean 5 mmHg  RV Systolic Pressure 31 mmHg  RV Diastolic Pressure 3 mmHg  RV EDP 8 mmHg  PA Systolic Pressure 27 mmHg  PA Diastolic Pressure 7 mmHg  PA Mean 18 mmHg  PW A Wave -99 mmHg  PW V Wave 12 mmHg  PW Mean 11 mmHg  AO Systolic Pressure [id number] mmHg  AO Diastolic Pressure 75 mmHg  AO Mean 92 mmHg  LV Systolic Pressure [id number] mmHg  LV Diastolic Pressure 5 mmHg  LV EDP 9 mmHg  Arterial Occlusion Pressure Extended Systolic Pressure [id number] mmHg  Arterial Occlusion Pressure Extended Diastolic Pressure 78 mmHg  Arterial Occlusion Pressure Extended Mean Pressure 91 mmHg  Left Ventricular Apex Extended Systolic Pressure [id number] mmHg  Left Ventricular Apex Extended Diastolic Pressure 5 mmHg  Left Ventricular Apex Extended EDP Pressure 10 mmHg  TPVR Index 5.72  HRUI  TSVR Index 29.29 HRUI  PVR SVR Ratio 0.08  TPVR/TSVR Ratio 0.2     EKG: Afib w/ increased ventricular rate, no acute ischemic changes, partial RBBB    Impression:  Patient has chronic persistent atrial fibrillation that has failed medical therapy on multiple agents, DC cardioversion, and previous catheter-based AFib ablation. He remains symptomatic with frequent palpitations, decreased energy, and exertional shortness of breath on current medical therapy. Recent transthoracic echocardiogram demonstrates further enlargement of the left atrium and decreased left ventricular ejection fraction in comparison with previous echocardiograms. The patient does not appear to have significant structural heart disease and diagnostic cardiac catheterization is notable for the absence of significant coronary artery disease. The patient is obese and has obstructive sleep apnea. Options include continued medical therapy with long-term rate control and anticoagulation versus a second attempt at  catheter-based ablation versus surgical Maze procedure.   Plan:  I have discussed the indications, risks, and potential benefits of Maze procedure with the patient and his wife in the office today. Alternative treatment strategies of been discussed including continued medical therapy versus another attempt at catheter-based ablation. The impact of comorbid medical problems including obesity and obstructive sleep apnea were discussed. Alternative surgical approaches for ablation have been discussed including those performed with and without the use of cardiopulmonary bypass and a comparison between conventional sternotomy and minimally-invasive techniques.  The expected likelihood of long term freedom from recurrent symptomatic atrial fibrillation and/or atrial flutter following maze procedure has been discussed. The relative risks and benefits of each have been reviewed as they pertain to the patient's specific circumstances, and all of their questions have been addressed.  The patient understands and accepts all potential associated risks of surgery including but not limited to risk of death, stroke, myocardial infarction, congestive heart failure, respiratory failure, renal failure, bleeding requiring blood transfusion and/or reexploration, arrhythmia, heart block or bradycardia requiring permanent pacemaker, pneumonia, pleural effusion, wound infection, pulmonary embolus or other thromboembolic complication, chronic pain or other delayed complications.  Specific risks potentially related to the minimally-invasive approach were discussed at length, including but not limited to risk of conversion to full or partial sternotomy, aortic dissection or other major vascular complication, unilateral acute lung injury or pulmonary edema, phrenic nerve dysfunction or paralysis, rib fracture, chronic pain, lung hernia, or lymphocele.  All questions answered.  The patient is interested in proceeding with surgery  sometime in early October.  We tentatively plan to proceed with minimally invasive Maze procedure on 03/10/2016. The patient will return for follow-up on 03/01/2016. Prior to that he will undergo CT angiography of the aorta and iliac vessels to evaluate the feasibility of peripheral cannulation for surgery.     I spent in excess of 90 minutes during the conduct of this office consultation and >50% of this time involved direct face-to-face encounter with the patient for counseling and/or coordination of their care.   [name]. Roxy Manns, MD 01/23/2016 3:33 PM

## 2016-02-04 ENCOUNTER — Ambulatory Visit: Payer: 59 | Admitting: Endocrinology

## 2016-02-04 ENCOUNTER — Other Ambulatory Visit: Payer: Self-pay | Admitting: *Deleted

## 2016-02-04 ENCOUNTER — Telehealth: Payer: Self-pay | Admitting: Endocrinology

## 2016-02-04 MED ORDER — GLIMEPIRIDE 4 MG PO TABS: 4.0000 mg | ORAL_TABLET | Freq: Every day | ORAL | 1 refills | Status: DC

## 2016-02-04 NOTE — Telephone Encounter (Signed)
PT requests refill of Glimepiride sent to [facility]

## 2016-02-04 NOTE — Telephone Encounter (Signed)
Rx sent 

## 2016-02-09 ENCOUNTER — Other Ambulatory Visit: Payer: Self-pay | Admitting: *Deleted

## 2016-02-09 ENCOUNTER — Telehealth: Payer: Self-pay | Admitting: Endocrinology

## 2016-02-09 MED ORDER — METFORMIN HCL 1000 MG PO TABS: 1000.0000 mg | ORAL_TABLET | Freq: Two times a day (BID) | ORAL | 1 refills | Status: DC

## 2016-02-09 NOTE — Telephone Encounter (Signed)
Pt needs refills on metformin [facility] [address]

## 2016-02-09 NOTE — Telephone Encounter (Signed)
Rx sent 

## 2016-02-15 ENCOUNTER — Other Ambulatory Visit: Payer: Self-pay | Admitting: Internal Medicine

## 2016-02-16 ENCOUNTER — Encounter: Payer: 59 | Admitting: Thoracic Surgery (Cardiothoracic Vascular Surgery)

## 2016-02-19 ENCOUNTER — Other Ambulatory Visit: Payer: Self-pay | Admitting: Endocrinology

## 2016-02-23 ENCOUNTER — Encounter: Payer: 59 | Admitting: Thoracic Surgery (Cardiothoracic Vascular Surgery)

## 2016-02-27 ENCOUNTER — Other Ambulatory Visit: Payer: Self-pay

## 2016-02-27 ENCOUNTER — Other Ambulatory Visit: Payer: Self-pay | Admitting: Thoracic Surgery (Cardiothoracic Vascular Surgery)

## 2016-03-01 ENCOUNTER — Encounter: Payer: Self-pay | Admitting: Thoracic Surgery (Cardiothoracic Vascular Surgery)

## 2016-03-01 ENCOUNTER — Ambulatory Visit
Admission: RE | Admit: 2016-03-01 | Discharge: 2016-03-01 | Disposition: A | Discharge status: Home / Self Care | Payer: 59 | Source: Ambulatory Visit | Attending: Thoracic Surgery (Cardiothoracic Vascular Surgery) | Admitting: Thoracic Surgery (Cardiothoracic Vascular Surgery)

## 2016-03-01 ENCOUNTER — Encounter: Payer: Self-pay | Admitting: Internal Medicine

## 2016-03-01 ENCOUNTER — Ambulatory Visit (INDEPENDENT_AMBULATORY_CARE_PROVIDER_SITE_OTHER): Payer: 59 | Admitting: Thoracic Surgery (Cardiothoracic Vascular Surgery)

## 2016-03-01 ENCOUNTER — Other Ambulatory Visit: Payer: Self-pay | Admitting: *Deleted

## 2016-03-01 VITALS — BP 115/70 | HR 96 | Resp 16 | Ht 74.0 in | Wt 256.4 lb

## 2016-03-01 DIAGNOSIS — I4819 Other persistent atrial fibrillation: Secondary | ICD-10-CM

## 2016-03-01 DIAGNOSIS — I7100 Dissection of unspecified site of aorta: Secondary | ICD-10-CM

## 2016-03-01 DIAGNOSIS — I481 Persistent atrial fibrillation: Secondary | ICD-10-CM | POA: Diagnosis not present

## 2016-03-01 MED ORDER — IOPAMIDOL (ISOVUE-370) INJECTION 76%
75.0000 mL | Freq: Once | INTRAVENOUS | Status: AC | PRN
  Administered 2016-03-01: 75 mL via INTRAVENOUS

## 2016-03-01 MED ORDER — AMIODARONE HCL 200 MG PO TABS: 200.0000 mg | ORAL_TABLET | Freq: Two times a day (BID) | ORAL | 0 refills | Status: DC

## 2016-03-01 NOTE — Patient Instructions (Signed)
You may resume taking metformin tomorrow.  Stop taking Xarelto after you take your dose on Friday 10/6.  Begin taking amiodarone at that time.  Continue taking all other medications without change through the day before surgery.  Have nothing to eat or drink after midnight the night before surgery.  On the morning of surgery take only Toprol, Synthroid and Cardizem with a sip of water.

## 2016-03-01 NOTE — Progress Notes (Signed)
[address]       [place] [address]             [phone]     CARDIOTHORACIC SURGERY OFFICE NOTE  Referring Provider is File Grayer, MD PCP is [name], MD   HPI:  Patient is 55 year old male with history of chronic persistent atrial fibrillation that has failed both medical therapy and catheter-based ablation who returns to the office today for follow-up with tentative plans to proceed with minimally invasive Maze procedure later next week. The patient was originally seen in consultation on 01/23/2016. Since then he reports no new problems or complaints.   Current Outpatient Prescriptions  Medication Sig Dispense Refill  . allopurinol (ZYLOPRIM) 300 MG tablet Take 1 tablet (300 mg total) by mouth daily. 90 tablet 0  . atorvastatin (LIPITOR) 10 MG tablet Take 1 tablet (10 mg total) by mouth daily. 90 tablet 1  . azelastine (ASTELIN) 0.1 % nasal spray Place 2 sprays into both nostrils 2 (two) times daily. 30 mL 0  . canagliflozin (INVOKANA) 100 MG TABS tablet Take 1 tablet (100 mg total) by mouth daily. 90 tablet 1  . dexlansoprazole (DEXILANT) 60 MG capsule Take 1 capsule (60 mg total) by mouth daily. 90 capsule 2  . diltiazem (CARDIZEM CD) 240 MG 24 hr capsule Take 1 capsule by mouth  daily 90 capsule 3  . famotidine (PEPCID) 20 MG tablet Take 1 tablet (20 mg total) by mouth at bedtime. 90 tablet 0  . fenofibrate 160 MG tablet Take 1 tablet (160 mg total) by mouth daily. 90 tablet 1  . fluticasone (FLONASE) 50 MCG/ACT nasal spray Place 2 sprays into both nostrils daily. 48 g 2  . glimepiride (AMARYL) 4 MG tablet Take 1 tablet (4 mg total) by mouth daily with breakfast. 90 tablet 1  . levothyroxine (SYNTHROID, LEVOTHROID) 200 MCG tablet Take 1 tablet (200 mcg total) by mouth daily before breakfast. 90 tablet 0  . lisinopril (PRINIVIL,ZESTRIL) 5 MG tablet Take 1 tablet (5 mg total) by mouth daily. 90 tablet 3  . metFORMIN (GLUCOPHAGE) 1000 MG tablet Take 1  tablet (1,000 mg total) by mouth 2 (two) times daily with a meal. 180 tablet 1  . metoprolol succinate (TOPROL-XL) 100 MG 24 hr tablet Take 1 tablet (100 mg total) by mouth daily. 90 tablet 3  . omega-3 acid ethyl esters (LOVAZA) 1 g capsule Take 2 capsules by mouth  two times daily  (4 gram total daily) 360 capsule 0  . rivaroxaban (XARELTO) 20 MG TABS tablet Take 1 tablet by mouth at  bedtime 90 tablet 3   No current facility-administered medications for this visit.       Physical Exam:   BP 115/70   Pulse 96   Resp 16   Ht 6\' 2"  (1.88 m)   Wt 256 lb 6.4 oz (116.3 kg)   SpO2 96% Comment: ON RA74IN  BMI 32.92 kg/m   General:  Obese but well appearing  Chest:   Clear to auscultation  CV:   Irregular rate and rhythm  Incisions:  n/a  Abdomen:  Soft and nontender  Extremities:  Warm and well-perfused  Diagnostic Tests:  CT ANGIOGRAPHY CHEST, ABDOMEN AND PELVIS  TECHNIQUE: Multidetector CT imaging through the chest, abdomen and pelvis was performed using the standard protocol during bolus administration of intravenous contrast. Multiplanar reconstructed images and MIPs were obtained and reviewed to evaluate the vascular anatomy.  CONTRAST:  75 mL  Isovue 370  COMPARISON:  None.  FINDINGS: CTA CHEST FINDINGS  Cardiovascular: Truncus anomaly of the thoracic aorta is noted. The origins of the brachiocephalic vessels are otherwise within normal limits. No aneurysmal dilatation of the ascending aorta is seen. No evidence of dissection is noted. The pulmonary artery as visualized is within normal limits. Normal superior and inferior pulmonary veins are noted bilaterally. No filling defect is noted within the left atrium. A moderate size left atrial appendage is seen. No significant coronary calcifications are noted. Left main coronary artery and left anterior descending coronary artery is have a normal course posterior to the pulmonary artery. No downward course  is identified.  Mediastinum/Nodes: Thoracic inlet is within normal limits. No hilar or mediastinal adenopathy is identified. No significant axillary adenopathy is noted. The esophagus as visualize is within normal limits.  Lungs/Pleura: Well-aerated bilaterally without focal infiltrate, effusion or pneumothorax. No sizable parenchymal nodule is seen.  Musculoskeletal: Mild degenerative change of the thoracic spine is noted. No compression deformities are noted.  Review of the MIP images confirms the above findings.  CTA ABDOMEN AND PELVIS FINDINGS  VASCULAR  Aorta: The abdominal aorta is well visualized and demonstrates no aneurysmal dilatation. Normal bifurcation of the common iliac arteries is noted bilaterally. No significant aneurysmal dilatation it is seen. Minimal atherosclerotic calcifications are noted.  Celiac: Widely patent  SMA: Widely patent  Renals: Single renal arteries are identified bilaterally which are widely patent.  IMA: Widely patent.  Outflow: The left common iliac artery measures approximately 16 mm in greatest dimension. The right common iliac artery measures approximately 13 mm in greatest dimension. The common femoral arteries are relatively symmetrical measuring approximately 12 mm in diameter.  Veins: The IVC is well visualized and within normal limits. No significant narrowing is seen. No definitive portal or hepatic venous abnormality is noted although visualization is limited due to the timing of the contrast bolus.  Review of the MIP images confirms the above findings.  NON-VASCULAR  Hepatobiliary: Diffuse fatty infiltration of the liver is noted. The gallbladder is within normal limits.  Pancreas: Unremarkable. No pancreatic ductal dilatation or surrounding inflammatory changes.  Spleen: Normal in size without focal abnormality.  Adrenals/Urinary Tract: The adrenal glands are within normal limits. No renal  calculi or obstructive changes are seen. Bilateral renal cystic changes are noted.  Stomach/Bowel: Stomach is within normal limits. Appendix appears normal. No evidence of bowel wall thickening, distention, or inflammatory changes. Scattered mild diverticular change is noted.  Lymphatic: No significant lymphadenopathy is identified.  Reproductive: Prostate is unremarkable.  Other: No abdominal wall hernia or abnormality. No abdominopelvic ascites.  Musculoskeletal: No acute or significant osseous findings.  Review of the MIP images confirms the above findings.  IMPRESSION: No evidence of aortic dissection or aneurysmal dilatation. Mild atherosclerotic calcifications are noted within the abdominal aorta.  Normal pulmonary venous anatomy. Left atrial appendage is noted. No thrombus is seen.  Fatty liver.  Bilateral renal cystic change.   Electronically Signed   By: Inez Catalina M.D.   On: 03/01/2016 16:37   Impression:  Patient has chronic persistent atrial fibrillation that has failed medical therapy on multiple agents, DC cardioversion, and previous catheter-based AFib ablation. He remains symptomatic with frequent palpitations, decreased energy, and exertional shortness of breath on current medical therapy. Recent transthoracic echocardiogram demonstrates further enlargement of the left atrium and decreased left ventricular ejection fraction in comparison with previous echocardiograms. The patient does not appear to have significant structural heart disease and diagnostic  cardiac catheterization is notable for the absence of significant coronary artery disease. The patient is obese and has obstructive sleep apnea. Options include continued medical therapy with long-term rate control and anticoagulation versus a second attempt at catheter-based ablation versus surgical Maze procedure.  CT angiogram of the aorta and iliac vessels revealed no contraindications to  peripheral cannulation for surgery.   Plan:  I have again discussed the indications, risks, and potential benefits of Maze procedure with the patient and his wife in the office today. Alternative treatment strategies of been discussed including continued medical therapy versus another attempt at catheter-based ablation. The impact of comorbid medical problems including obesity and obstructive sleep apnea were discussed. Alternative surgical approaches for ablation have been discussed including those performed with and without the use of cardiopulmonary bypass and a comparison between conventional sternotomy and minimally-invasive techniques.  The expected likelihood of long term freedom from recurrent symptomatic atrial fibrillation and/or atrial flutter following maze procedure has been discussed. The relative risks and benefits of each have been reviewed as they pertain to the patient's specific circumstances, and all of their questions have been addressed.  The patient understands and accepts all potential associated risks of surgery including but not limited to risk of death, stroke, myocardial infarction, congestive heart failure, respiratory failure, renal failure, bleeding requiring blood transfusion and/or reexploration, arrhythmia, heart block or bradycardia requiring permanent pacemaker, pneumonia, pleural effusion, wound infection, pulmonary embolus or other thromboembolic complication, chronic pain or other delayed complications.  Specific risks potentially related to the minimally-invasive approach were discussed at length, including but not limited to risk of conversion to full or partial sternotomy, aortic dissection or other major vascular complication, unilateral acute lung injury or pulmonary edema, phrenic nerve dysfunction or paralysis, rib fracture, chronic pain, lung hernia, or lymphocele.   We plan to proceed with surgery on Friday, 03/12/2016. The patient has been instructed to stop  taking Xarelto 1 week prior to surgery. We discussed whether or not to use bridging therapy with Lovenox injections, and the patient does not wish to do so. Under the circumstances I feel that bridging therapy is not necessary. The patient has been given a prescription for amiodarone to begin 1 week prior to surgery.  Expectations for the patient's postoperative convalescence have been discussed in detail. All of his questions have been addressed.      I spent in excess of 30 minutes during the conduct of this office consultation and >50% of this time involved direct face-to-face encounter with the patient for counseling and/or coordination of their care.   [name]. Roxy Manns, MD 03/01/2016 4:47 PM

## 2016-03-02 ENCOUNTER — Encounter: Payer: Self-pay | Admitting: Endocrinology

## 2016-03-02 ENCOUNTER — Ambulatory Visit (INDEPENDENT_AMBULATORY_CARE_PROVIDER_SITE_OTHER): Payer: 59 | Admitting: Endocrinology

## 2016-03-02 ENCOUNTER — Other Ambulatory Visit: Payer: Self-pay

## 2016-03-02 VITALS — BP 118/81 | HR 107 | Ht 74.0 in | Wt 253.0 lb

## 2016-03-02 DIAGNOSIS — E781 Pure hyperglyceridemia: Secondary | ICD-10-CM

## 2016-03-02 DIAGNOSIS — F418 Other specified anxiety disorders: Secondary | ICD-10-CM

## 2016-03-02 DIAGNOSIS — Z6832 Body mass index (BMI) 32.0-32.9, adult: Secondary | ICD-10-CM

## 2016-03-02 DIAGNOSIS — E063 Autoimmune thyroiditis: Secondary | ICD-10-CM

## 2016-03-02 DIAGNOSIS — E038 Other specified hypothyroidism: Secondary | ICD-10-CM

## 2016-03-02 DIAGNOSIS — E1165 Type 2 diabetes mellitus with hyperglycemia: Secondary | ICD-10-CM

## 2016-03-02 DIAGNOSIS — E039 Hypothyroidism, unspecified: Secondary | ICD-10-CM

## 2016-03-02 LAB — POCT GLYCOSYLATED HEMOGLOBIN (HGB A1C): Hemoglobin A1C: 7.5

## 2016-03-02 MED ORDER — METFORMIN HCL 1000 MG PO TABS: 1000.0000 mg | ORAL_TABLET | Freq: Two times a day (BID) | ORAL | 1 refills | Status: DC

## 2016-03-02 MED ORDER — GLIMEPIRIDE 4 MG PO TABS: 4.0000 mg | ORAL_TABLET | Freq: Every day | ORAL | 1 refills | Status: DC

## 2016-03-02 MED ORDER — CANAGLIFLOZIN 300 MG PO TABS: 300.0000 mg | ORAL_TABLET | Freq: Every day | ORAL | 1 refills | Status: DC

## 2016-03-02 MED ORDER — DIAZEPAM 5 MG PO TABS
5.0000 mg | ORAL_TABLET | Freq: Four times a day (QID) | ORAL | 0 refills | Status: DC | PRN
Start: 2016-03-02 — End: 2016-03-19

## 2016-03-02 NOTE — Patient Instructions (Addendum)
Schedule eye exam  Check blood sugars on waking up  2-3x per week  Also check blood sugars about 2 hours after a meal and do this after different meals by rotation  Recommended blood sugar levels on waking up is 90-130 and about 2 hours after meal is 130-160  Please bring your blood sugar monitor to each visit, thank you  Invokana 2 tabs in am, next Rx will be 300mg   Start walking at lunch when able  Low fat Bfst, no fast food

## 2016-03-02 NOTE — Telephone Encounter (Signed)
RX for Valium 5 mg called to [facility]. Patient is aware.

## 2016-03-02 NOTE — Progress Notes (Signed)
Patient ID: Colin Wood, male   DOB: 1961/02/10, 55 y.o.   MRN: [id number]           Reason for Appointment: Follow-up for Type 2 Diabetes  Referring physician: Larose Kells  History of Present Illness:          Diagnosis: Type 2 diabetes mellitus, date of diagnosis: ?  2011        Past history: He was not having any symptoms at the time of diagnosis and not clear what his initial blood sugars were He was started on metformin initially and had fairly good control Subsequently with higher sugars he was given Amaryl also in addition which has been continued.  A1c was 10.6% in 2012 Subsequently in 2014 his A1c was down to 6.5 but he had a regular follow-up subsequently Because of an A1c of 8.1 in 02/2014 he was given Tradjenta in addition to the above drugs but this was not effective  He was referred here because of a high A1c of 7.9 in 07/2014  Recent history:   Oral hypoglycemic drugs the patient is taking are: metformin 1 g twice a day, Amaryl 4 mg in a.m., Invokana 100 mg daily      He has difficulty controlling his diabetes because of his poor compliance with diet, exercise and glucose monitoring  Has not been seen in follow-up for almost a year Previously his A1c had been 7 or below but now recently has been 7.5-7.8  Current management, blood sugar values and problems identified:  He is doing only fasting readings recently and these are also sporadic  Currently using expired test strips from 2014  His fasting readings are relatively high, was 158 on his labs recently  Does not remember to do readings after meals  He is still not watching his diet and usually will need to meet biscuit in the morning  Still not motivated to exercise, he says he is too tired in the evenings        Side effects from medications have been:None  Compliance with the medical regimen: Fair   Glucose monitoring:  done occasionally        Glucometer:   One Touch.      Blood Glucose readings by  review of monitor:    Recent range 164-178, all in the morning  Self-care: The diet that the patient has been following is: None, he has difficulty controlling portions     Meals: 3 meals per day. His breakfast  is usually a biscuit at a fast food restaurant.  He is snacking on apples and yogurt  snacks           Exercise: none          Dietician visit, most recent: none, previously had gone to a class          Weight history: Previous range 260-280  Wt Readings from Last 3 Encounters:  03/02/16 253 lb (114.8 kg)  03/01/16 256 lb 6.4 oz (116.3 kg)  01/23/16 255 lb (115.7 kg)    Glycemic control:   Lab Results  Component Value Date   HGBA1C 7.5 03/02/2016   HGBA1C 7.0 (H) 04/14/2015   HGBA1C 6.9 (A) 01/14/2015   Lab Results  Component Value Date   LDLCALC 93 03/19/2015   CREATININE 1.19 01/05/2016        Medication List       Accurate as of 03/02/16 11:01 AM. Always use your most recent med list.  allopurinol 300 MG tablet Commonly known as:  ZYLOPRIM Take 1 tablet (300 mg total) by mouth daily.   amiodarone 200 MG tablet Commonly known as:  PACERONE Take 1 tablet (200 mg total) by mouth 2 (two) times daily with a meal.   atorvastatin 10 MG tablet Commonly known as:  LIPITOR Take 1 tablet (10 mg total) by mouth daily.   azelastine 0.1 % nasal spray Commonly known as:  ASTELIN Place 2 sprays into both nostrils 2 (two) times daily.   canagliflozin 100 MG Tabs tablet Commonly known as:  INVOKANA Take 1 tablet (100 mg total) by mouth daily.   dexlansoprazole 60 MG capsule Commonly known as:  DEXILANT Take 1 capsule (60 mg total) by mouth daily.   diltiazem 240 MG 24 hr capsule Commonly known as:  CARDIZEM CD Take 1 capsule by mouth  daily   famotidine 20 MG tablet Commonly known as:  PEPCID Take 1 tablet (20 mg total) by mouth at bedtime.   fenofibrate 160 MG tablet Take 1 tablet (160 mg total) by mouth daily.   fluticasone 50 MCG/ACT  nasal spray Commonly known as:  FLONASE Place 2 sprays into both nostrils daily.   glimepiride 4 MG tablet Commonly known as:  AMARYL Take 1 tablet (4 mg total) by mouth daily with breakfast.   levothyroxine 200 MCG tablet Commonly known as:  SYNTHROID, LEVOTHROID Take 1 tablet (200 mcg total) by mouth daily before breakfast.   lisinopril 5 MG tablet Commonly known as:  PRINIVIL,ZESTRIL Take 1 tablet (5 mg total) by mouth daily.   metFORMIN 1000 MG tablet Commonly known as:  GLUCOPHAGE Take 1 tablet (1,000 mg total) by mouth 2 (two) times daily with a meal.   metoprolol succinate 100 MG 24 hr tablet Commonly known as:  TOPROL-XL Take 1 tablet (100 mg total) by mouth daily.   omega-3 acid ethyl esters 1 g capsule Commonly known as:  LOVAZA Take 2 capsules by mouth  two times daily  (4 gram total daily)   rivaroxaban 20 MG Tabs tablet Commonly known as:  XARELTO Take 1 tablet by mouth at  bedtime       Allergies: No Known Allergies  Past Medical History:  Diagnosis Date  . Atrial flutter ([place])    typical appearing; s/p ablation 12-2012  . Depression    used to see psych, on no meds as of 03/2009  . Diabetes mellitus    TYPE 2  . GERD (gastroesophageal reflux disease)   . Gout   . Hyperlipidemia   . Hypothyroidism   . Persistent atrial fibrillation ([place])    PVI 12/2012  . Rhinitis    vasomotor  . Sleep apnea    mild, now treated with CPAP since ~9-12    Past Surgical History:  Procedure Laterality Date  . ABLATION OF DYSRHYTHMIC FOCUS  01/05/2013   PVI and flutter ablation by [name]  . ATRIAL FIBRILLATION ABLATION N/A 01/05/2013   Procedure: ATRIAL FIBRILLATION ABLATION;  Surgeon: Zayas Grayer, MD;  Location: [facility] CATH LAB;  Service: Cardiovascular;  Laterality: N/A;  . CARDIAC CATHETERIZATION N/A 01/12/2016   Procedure: Right/Left Heart Cath and Coronary Angiography;  Surgeon: [name], MD;  Location: [facility] CV LAB;  Service: Cardiovascular;   Laterality: N/A;  . CARDIOVERSION  03/01/11  . NASAL SINUS SURGERY    . TEE WITHOUT CARDIOVERSION N/A 01/04/2013   Procedure: TRANSESOPHAGEAL ECHOCARDIOGRAM (TEE);  Surgeon: [name], MD;  Location: [facility];  Service: Cardiovascular;  Laterality: N/A;  . TONSILLECTOMY    . VASECTOMY      Family History  Problem Relation Age of Onset  . Diabetes Father   . Hypertension Brother   . Melanoma Brother   . Asthma Mother   . Allergies Daughter   . Throat cancer Maternal Uncle   . Colon cancer Paternal Grandfather   . Prostate cancer Neg Hx     Social History:  reports that he has never smoked. He has never used smokeless tobacco. He reports that he does not drink alcohol or use drugs.    Review of Systems   Most recent eye exam was in  7/15 Showing no retinopathy        Lipids:  has high triglycerides mostly previously as high as 681; LDL previously also consistently high.   On Fenofibrate and taking Lipitor 10 mg daily  Has not had his follow-up with PCP also       Lab Results  Component Value Date   CHOL 179 03/19/2015   HDL 38 03/19/2015   [place] 93 03/19/2015   TRIG 238 (A) 03/19/2015                   Thyroid: He was diagnosed to have hypothyroidism in 1994 has been on medication since then  He is compliant with his Synthroid lately but has not had any follow-up, his last TSH was high when he had run out of his Synthroid   Lab Results  Component Value Date   TSH 8.050 (H) 04/08/2015   TSH 0.32 (A) 03/19/2015   TSH 1.07 08/02/2014       The blood pressure has been  previously high, has  been on diltiazem and metoprolol for his atrial arrhythmia and low-dose lisinopril 5 mg    Physical Examination:  BP 118/81   Pulse (!) 107   Ht 6\' 2"  (1.88 m)   Wt 253 lb (114.8 kg)   BMI 32.48 kg/m      Diabetic Foot Exam - Simple   Simple Foot Form Diabetic Foot exam was performed with the following findings:  Yes   Visual Inspection No deformities, no  ulcerations, no other skin breakdown bilaterally:  Yes Sensation Testing Intact to touch and monofilament testing bilaterally:  Yes Pulse Check Posterior Tibialis and Dorsalis pulse intact bilaterally:  Yes Comments     ASSESSMENT:  Diabetes type 2, uncontrolled with obesity He is on a 3 drug regimen of metformin, Amaryl and Invokana 100 mg  See history of present illness for detailed discussion of his current management, blood sugar patterns and problems identified  He has difficulty being consistent with diet And also does not exercise Still having difficulty losing weight He appears to have relatively high resting readings and does not monitor after meals Also showed him that his test strips are expired 3 years ago  HYPERTRIGLYCERIDEMIA: Needs follow-up fasting labs and he will do them before his next visit  HYPERTENSION: He will follow-up with cardiologist  HYPOTHYROIDISM: His TSH needs to be rechecked on his next labs  PLAN:   Increase Invokana to 300 mg, he can take 2 tablets of the 100 mg until he gets the new prescription  He will check some readings after meals including after evening meal along with occasional fasting readings  New test strips prescribed  Consultation with dietitian  Advised him to improve diet and avoid fast food especially in the morning  He can start walking for exercise once his  arrhythmia problem is taking care of.  He can do this at  lunchtime since his job is sedentary  He can call if his blood sugars tend to be much higher after his cardiac surgery, may increase his Amaryl at that time  He needs to schedule his follow-up eye exam, this is recommended at least every other year  Follow-up in 2 months and more consistent follow-up both here and with his PCP  Patient Instructions  Schedule eye exam  Check blood sugars on waking up  2-3x per week  Also check blood sugars about 2 hours after a meal and do this after different meals by  rotation  Recommended blood sugar levels on waking up is 90-130 and about 2 hours after meal is 130-160  Please bring your blood sugar monitor to each visit, thank you  Invokana 2 tabs in am, next Rx will be 300mg   Start walking at lunch when able  Low fat Bfst, no fast food        Counseling time on subjects discussed above is over 50% of today's 25 minute visit   Colin Wood 03/02/2016, 11:01 AM   Note: This office note was prepared with Estate agent. Any transcriptional errors that result from this process are unintentional.

## 2016-03-08 ENCOUNTER — Other Ambulatory Visit (HOSPITAL_COMMUNITY): Payer: 59

## 2016-03-08 ENCOUNTER — Encounter (HOSPITAL_COMMUNITY): Payer: 59

## 2016-03-08 ENCOUNTER — Ambulatory Visit (HOSPITAL_COMMUNITY): Payer: 59

## 2016-03-10 ENCOUNTER — Encounter: Payer: Self-pay | Admitting: Thoracic Surgery (Cardiothoracic Vascular Surgery)

## 2016-03-10 ENCOUNTER — Telehealth: Payer: Self-pay | Admitting: Thoracic Surgery (Cardiothoracic Vascular Surgery)

## 2016-03-10 NOTE — Telephone Encounter (Signed)
Returned call from patient regarding symptoms of nasal congestion and sinus drainage.  He denies fevers, chills, myalgias, or productive cough and states that he has dealt with sinus congestion and issues intermittently for years.  Discussed over the counter medications for symptom relief and things to be looking for that might suggest that we should postpone surgery.  He does not wish to postpone surgery. Will check routine blood work and CXR tomorrow.  All questions answered.  [name], MD 03/10/2016 10:24 AM

## 2016-03-11 ENCOUNTER — Ambulatory Visit (HOSPITAL_COMMUNITY)
Admission: RE | Admit: 2016-03-11 | Discharge: 2016-03-11 | Disposition: A | Discharge status: Home / Self Care | Payer: 59 | Source: Ambulatory Visit | Attending: Thoracic Surgery (Cardiothoracic Vascular Surgery) | Admitting: Thoracic Surgery (Cardiothoracic Vascular Surgery)

## 2016-03-11 ENCOUNTER — Other Ambulatory Visit: Payer: Self-pay

## 2016-03-11 ENCOUNTER — Encounter (HOSPITAL_COMMUNITY): Payer: Self-pay

## 2016-03-11 ENCOUNTER — Encounter (HOSPITAL_COMMUNITY)
Admission: RE | Admit: 2016-03-11 | Discharge: 2016-03-11 | Disposition: A | Discharge status: Home / Self Care | Payer: 59 | Source: Ambulatory Visit | Attending: Thoracic Surgery (Cardiothoracic Vascular Surgery) | Admitting: Thoracic Surgery (Cardiothoracic Vascular Surgery)

## 2016-03-11 ENCOUNTER — Ambulatory Visit (HOSPITAL_COMMUNITY): Admission: RE | Admit: 2016-03-11 | Payer: 59 | Source: Ambulatory Visit

## 2016-03-11 ENCOUNTER — Ambulatory Visit (HOSPITAL_BASED_OUTPATIENT_CLINIC_OR_DEPARTMENT_OTHER)
Admission: RE | Admit: 2016-03-11 | Discharge: 2016-03-11 | Disposition: A | Discharge status: Home / Self Care | Payer: 59 | Source: Ambulatory Visit | Attending: Thoracic Surgery (Cardiothoracic Vascular Surgery) | Admitting: Thoracic Surgery (Cardiothoracic Vascular Surgery)

## 2016-03-11 DIAGNOSIS — I4891 Unspecified atrial fibrillation: Secondary | ICD-10-CM

## 2016-03-11 DIAGNOSIS — I6523 Occlusion and stenosis of bilateral carotid arteries: Secondary | ICD-10-CM | POA: Insufficient documentation

## 2016-03-11 DIAGNOSIS — Z01818 Encounter for other preprocedural examination: Secondary | ICD-10-CM | POA: Insufficient documentation

## 2016-03-11 LAB — PULMONARY FUNCTION TEST
DL/VA % PRED: 92 %
DL/VA: 4.48 ml/min/mmHg/L
DLCO COR: 27.39 ml/min/mmHg
DLCO UNC % PRED: 75 %
DLCO UNC: 28.55 ml/min/mmHg
DLCO cor % pred: 72 %
FEF 25-75 PRE: 3.66 L/s
FEF 25-75 Post: 3.89 L/sec
FEF2575-%CHANGE-POST: 6 %
FEF2575-%PRED-POST: 109 %
FEF2575-%PRED-PRE: 103 %
FEV1-%Change-Post: 2 %
FEV1-%PRED-POST: 90 %
FEV1-%PRED-PRE: 87 %
FEV1-Post: 3.84 L
FEV1-Pre: 3.73 L
FEV1FVC-%Change-Post: 2 %
FEV1FVC-%PRED-PRE: 104 %
FEV6-%CHANGE-POST: 0 %
FEV6-%PRED-POST: 88 %
FEV6-%Pred-Pre: 87 %
FEV6-Post: 4.73 L
FEV6-Pre: 4.69 L
FEV6FVC-%PRED-POST: 104 %
FEV6FVC-%Pred-Pre: 104 %
FVC-%Change-Post: 0 %
FVC-%Pred-Post: 84 %
FVC-%Pred-Pre: 84 %
FVC-Post: 4.73 L
FVC-Pre: 4.69 L
POST FEV1/FVC RATIO: 81 %
Post FEV6/FVC ratio: 100 %
Pre FEV1/FVC ratio: 80 %
Pre FEV6/FVC Ratio: 100 %
RV % pred: 101 %
RV: 2.43 L
TLC % pred: 94 %
TLC: 7.36 L

## 2016-03-11 LAB — URINE MICROSCOPIC-ADD ON

## 2016-03-11 LAB — BLOOD GAS, ARTERIAL
Acid-base deficit: 1.9 mmol/L (ref 0.0–2.0)
BICARBONATE: 21.7 mmol/L (ref 20.0–28.0)
DRAWN BY: 242311
FIO2: 21
O2 Saturation: 97.5 %
PATIENT TEMPERATURE: 98.6
pCO2 arterial: 32.4 mmHg (ref 32.0–48.0)
pH, Arterial: 7.44 (ref 7.350–7.450)
pO2, Arterial: 93.3 mmHg (ref 83.0–108.0)

## 2016-03-11 LAB — APTT: APTT: 27 s (ref 24–36)

## 2016-03-11 LAB — COMPREHENSIVE METABOLIC PANEL
ALBUMIN: 4.7 g/dL (ref 3.5–5.0)
ALK PHOS: 44 U/L (ref 38–126)
ALT: 43 U/L (ref 17–63)
ANION GAP: 11 (ref 5–15)
AST: 33 U/L (ref 15–41)
BUN: 19 mg/dL (ref 6–20)
CALCIUM: 10 mg/dL (ref 8.9–10.3)
CO2: 22 mmol/L (ref 22–32)
Chloride: 105 mmol/L (ref 101–111)
Creatinine, Ser: 1.25 mg/dL — ABNORMAL HIGH (ref 0.61–1.24)
GFR calc Af Amer: >60 mL/min (ref >60)
GFR calc non Af Amer: >60 mL/min (ref >60)
GLUCOSE: 174 mg/dL — AB (ref 65–99)
Potassium: 4 mmol/L (ref 3.5–5.1)
SODIUM: 138 mmol/L (ref 135–145)
Total Bilirubin: 1.1 mg/dL (ref 0.3–1.2)
Total Protein: 6.7 g/dL (ref 6.5–8.1)

## 2016-03-11 LAB — URINALYSIS, ROUTINE W REFLEX MICROSCOPIC
BILIRUBIN URINE: NEGATIVE
Glucose, UA: >1000 mg/dL — AB
HGB URINE DIPSTICK: NEGATIVE
KETONES UR: NEGATIVE mg/dL
Leukocytes, UA: NEGATIVE
Nitrite: NEGATIVE
PROTEIN: NEGATIVE mg/dL
Specific Gravity, Urine: 1.041 — ABNORMAL HIGH (ref 1.005–1.030)
pH: 5.5 (ref 5.0–8.0)

## 2016-03-11 LAB — VAS US DOPPLER PRE CABG
LEFT ECA DIAS: -12 cm/s
LEFT VERTEBRAL DIAS: 15 cm/s
LICADDIAS: -17 cm/s
LICAPDIAS: -12 cm/s
LICAPSYS: -41 cm/s
Left CCA dist dias: -29 cm/s
Left CCA dist sys: -115 cm/s
Left CCA prox dias: 13 cm/s
Left CCA prox sys: 102 cm/s
Left ICA dist sys: -57 cm/s
RIGHT ECA DIAS: -14 cm/s
RIGHT VERTEBRAL DIAS: 10 cm/s
Right CCA prox dias: 19 cm/s
Right CCA prox sys: 127 cm/s
Right cca dist sys: -48 cm/s

## 2016-03-11 LAB — PROTIME-INR
INR: 1.05
Prothrombin Time: 13.7 seconds (ref 11.4–15.2)

## 2016-03-11 LAB — GLUCOSE, CAPILLARY: GLUCOSE-CAPILLARY: 242 mg/dL — AB (ref 65–99)

## 2016-03-11 LAB — CBC
HCT: 44.7 % (ref 39.0–52.0)
HEMOGLOBIN: 16.2 g/dL (ref 13.0–17.0)
MCH: 29.7 pg (ref 26.0–34.0)
MCHC: 36.2 g/dL — AB (ref 30.0–36.0)
MCV: 81.9 fL (ref 78.0–100.0)
Platelets: 216 10*3/uL (ref 150–400)
RBC: 5.46 MIL/uL (ref 4.22–5.81)
RDW: 13.1 % (ref 11.5–15.5)
WBC: 5.9 10*3/uL (ref 4.0–10.5)

## 2016-03-11 LAB — TYPE AND SCREEN
ABO/RH(D): A POS
ANTIBODY SCREEN: NEGATIVE

## 2016-03-11 LAB — ABO/RH: ABO/RH(D): A POS

## 2016-03-11 LAB — SURGICAL PCR SCREEN
MRSA, PCR: NEGATIVE
Staphylococcus aureus: POSITIVE — AB

## 2016-03-11 MED ORDER — CEFUROXIME SODIUM 750 MG IJ SOLR
750.0000 mg | INTRAMUSCULAR | Status: DC
  Filled 2016-03-11 (×2): qty 750

## 2016-03-11 MED ORDER — SODIUM CHLORIDE 0.9 % IV SOLN
INTRAVENOUS | Status: AC
  Administered 2016-03-12: 1.7 [IU]/h via INTRAVENOUS
  Filled 2016-03-11 (×2): qty 2.5

## 2016-03-11 MED ORDER — ALBUTEROL SULFATE (2.5 MG/3ML) 0.083% IN NEBU
2.5000 mg | INHALATION_SOLUTION | Freq: Once | RESPIRATORY_TRACT | Status: AC
  Administered 2016-03-11: 2.5 mg via RESPIRATORY_TRACT

## 2016-03-11 MED ORDER — SODIUM CHLORIDE 0.9 % IV SOLN
INTRAVENOUS | Status: DC
  Filled 2016-03-11 (×2): qty 30

## 2016-03-11 MED ORDER — DOPAMINE-DEXTROSE 3.2-5 MG/ML-% IV SOLN
0.0000 ug/kg/min | INTRAVENOUS | Status: DC
  Filled 2016-03-11 (×2): qty 250

## 2016-03-11 MED ORDER — NITROGLYCERIN IN D5W 200-5 MCG/ML-% IV SOLN
2.0000 ug/min | INTRAVENOUS | Status: DC
  Filled 2016-03-11 (×2): qty 250

## 2016-03-11 MED ORDER — TRANEXAMIC ACID 1000 MG/10ML IV SOLN
1.5000 mg/kg/h | INTRAVENOUS | Status: AC
  Administered 2016-03-12: 1.5 mg/kg/h via INTRAVENOUS
  Filled 2016-03-11 (×2): qty 25

## 2016-03-11 MED ORDER — PHENYLEPHRINE HCL 10 MG/ML IJ SOLN
30.0000 ug/min | INTRAVENOUS | Status: AC
  Administered 2016-03-12: 20 ug/min via INTRAVENOUS
  Filled 2016-03-11 (×2): qty 2

## 2016-03-11 MED ORDER — DEXMEDETOMIDINE HCL IN NACL 400 MCG/100ML IV SOLN
0.1000 ug/kg/h | INTRAVENOUS | Status: AC
  Administered 2016-03-12: 12:00:00 via INTRAVENOUS
  Administered 2016-03-12: .5 ug/kg/h via INTRAVENOUS
  Filled 2016-03-11 (×2): qty 100

## 2016-03-11 MED ORDER — VANCOMYCIN HCL 10 G IV SOLR
1500.0000 mg | INTRAVENOUS | Status: AC
  Administered 2016-03-12: 1500 mg via INTRAVENOUS
  Filled 2016-03-11 (×2): qty 1500

## 2016-03-11 MED ORDER — METOPROLOL TARTRATE 12.5 MG HALF TABLET: 12.5000 mg | ORAL_TABLET | Freq: Once | ORAL | Status: DC

## 2016-03-11 MED ORDER — DEXTROSE 5 % IV SOLN
1.5000 g | INTRAVENOUS | Status: AC
  Administered 2016-03-12: 1.5 g via INTRAVENOUS
  Administered 2016-03-12: .75 g via INTRAVENOUS
  Filled 2016-03-11 (×3): qty 1.5

## 2016-03-11 MED ORDER — PLASMA-LYTE 148 IV SOLN
INTRAVENOUS | Status: AC
  Administered 2016-03-12: 10:00:00
  Filled 2016-03-11 (×2): qty 2.5

## 2016-03-11 MED ORDER — TRANEXAMIC ACID (OHS) PUMP PRIME SOLUTION
2.0000 mg/kg | INTRAVENOUS | Status: DC
  Filled 2016-03-11 (×2): qty 2.3

## 2016-03-11 MED ORDER — TRANEXAMIC ACID (OHS) BOLUS VIA INFUSION
15.0000 mg/kg | INTRAVENOUS | Status: AC
  Administered 2016-03-12: 1728 mg via INTRAVENOUS
  Filled 2016-03-11 (×2): qty 1728

## 2016-03-11 MED ORDER — MAGNESIUM SULFATE 50 % IJ SOLN
40.0000 meq | INTRAMUSCULAR | Status: DC
  Filled 2016-03-11 (×2): qty 10

## 2016-03-11 MED ORDER — EPINEPHRINE PF 1 MG/ML IJ SOLN
0.0000 ug/min | INTRAMUSCULAR | Status: DC
  Filled 2016-03-11 (×2): qty 4

## 2016-03-11 MED ORDER — CHLORHEXIDINE GLUCONATE 0.12 % MT SOLN
15.0000 mL | Freq: Once | OROMUCOSAL | Status: AC
  Administered 2016-03-12: 15 mL via OROMUCOSAL
  Filled 2016-03-11: qty 15

## 2016-03-11 MED ORDER — POTASSIUM CHLORIDE 2 MEQ/ML IV SOLN
80.0000 meq | INTRAVENOUS | Status: DC
  Filled 2016-03-11 (×2): qty 40

## 2016-03-11 NOTE — Pre-Procedure Instructions (Signed)
NEAVEN DAISY  03/11/2016      [id number] - [address], [place] [address] Phone: [phone] Fax: [phone]  [place], [place] [facility] [address] [place] [address] [place] [address] Phone: [phone] Fax: [phone]    Your procedure is scheduled on Friday, Oct 13  Report to [facility] at 5:30 A.M.  Call this number if you have problems the morning of surgery:  [phone]   Remember:  Do not eat food or drink liquids after midnight.  Take these medicines the morning of surgery with A SIP OF WATER: allopurinol (zyloprim), amiodarone (pacerone),nasal spray, valium if needed, dilitiazem (cardizem), levothyroxine(synthroid), metoprolol,(toprol xl), dexilant              Take medicines that Dr. Roxy Manns reviewed with you.              Stop advil, motrin, ibuprofen, aleve,fish oil, and vitamins/herbal medicines.     How to Manage Your Diabetes Before and After Surgery  Why is it important to control my blood sugar before and after surgery? . Improving blood sugar levels before and after surgery helps healing and can limit problems. . A way of improving blood sugar control is eating a healthy diet by: o  Eating less sugar and carbohydrates o  Increasing activity/exercise o  Talking with your doctor about reaching your blood sugar goals . High blood sugars (greater than 180 mg/dL) can raise your risk of infections and slow your recovery, so you will need to focus on controlling your diabetes during the weeks before surgery. . Make sure that the doctor who takes care of your diabetes knows about your planned surgery including the date and location.  How do I manage my blood sugar before surgery? . Check your blood sugar at least 4 times a day, starting 2 days before surgery, to make sure that the level is not too high or low. o Check your blood  sugar the morning of your surgery when you wake up and every 2 hours until you get to the Short Stay unit. . If your blood sugar is less than 70 mg/dL, you will need to treat for low blood sugar: o Do not take insulin. o Treat a low blood sugar (less than 70 mg/dL) with  cup of clear juice (cranberry or apple), 4 glucose tablets, OR glucose gel. o Recheck blood sugar in 15 minutes after treatment (to make sure it is greater than 70 mg/dL). If your blood sugar is not greater than 70 mg/dL on recheck, call [phone] for further instructions. . Report your blood sugar to the short stay nurse when you get to Short Stay.  . If you are admitted to the hospital after surgery: o Your blood sugar will be checked by the staff and you will probably be given insulin after surgery (instead of oral diabetes medicines) to make sure you have good blood sugar levels. o The goal for blood sugar control after surgery is 80-180 mg/dL.              WHAT DO I DO ABOUT MY DIABETES MEDICATION?   [name] Do not take oral diabetes medicines (pills) the morning of surgery.         Do not wear jewelry.  Do not wear lotions, powders, or cologne, or deoderant.  Do not shave 48 hours prior to surgery.  Men may shave  face and neck.  Do not bring valuables to the hospital.  [facility] is not responsible for any belongings or valuables.  Contacts, dentures or bridgework may not be worn into surgery.  Leave your suitcase in the car.  After surgery it may be brought to your room.  For patients admitted to the hospital, discharge time will be determined by your treatment team.  Patients discharged the day of surgery will not be allowed to drive home.    Special instructions: review all handouts  Please read over the following fact sheets that you were given. Coughing and Deep Breathing and MRSA Information

## 2016-03-11 NOTE — Progress Notes (Signed)
Prescription for mupirocin ointment called to [facility] in [place]

## 2016-03-11 NOTE — Progress Notes (Signed)
PCP: Dr. Larose Kells Cardiologist: Dr. Marlou Porch Endocrinologist: Dr. Dwyane Dee  Fasting sugars 125-165  Pt. Stopped xarelto Oct. 6 per Dr. Roxy Manns

## 2016-03-11 NOTE — H&P (Signed)
[address]       [place] [address]             [phone]          CARDIOTHORACIC SURGERY HISTORY AND PHYSICAL EXAM  Referring Provider is Dun Grayer, MD PCP is [name], MD      Chief Complaint  Patient presents with  . Atrial Fibrillation    Surgical eval for possiblel MAZE procedure, Cardiac Cath 01/12/16, ECHO 09/25/15    HPI:  Patient is a 55 year old obese white male with history of chronic persistent atrial fibrillation that has failed both medical therapy and catheter-based ablation who has been referred for surgical consultation to discuss further treatment options for management of atrial fibrillation. The patient's history of atrial fibrillation dates back to July 2012 when he was found to be in atrial fibrillation on routine follow-up EKG performed by his primary care physician. He was referred for cardiology consultation and initially evaluated by [name].  An echocardiogram performed at that time revealed normal left ventricular systolic function. The patient was suspected to have obstructive sleep apnea and referred for sleep consult which confirmed the presence of obstructive sleep apnea. He has been treated using CPAP ever since. He was eventually referred to [name] who has been following him for several years for atrial fibrillation. He has failed medical therapy on Tikosyn and has been cardioverted on 2 occasions, neither of which worked.  He underwent catheter-based Afib ablation in 2014 and did well for approximately 6 months until atrial fibrillation recurred.  He was seen in follow-up recently by [name] and echocardiogram revealed further enlargement of the left atrium with what appeared to be significant drop in left ventricular ejection fraction. Treatment options were discussed and the patient has subsequently been referred for surgical consultation to consider possible Maze procedure.  The patient is married and  lives locally in [place] with his wife. 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  He lives a sedentary lifestyle and admits that he does not exercise at all on a regular basis. He has been moderately obese for much of his adult life. He has not been successful in losing any significant amount of weight. He describes a slow gradual progression of symptoms of decreased energy with occasional exertional shortness of breath. He has palpitations. He denies any history of chest pain or chest tightness either with activity or at rest. He has never had any resting shortness of breath, PND, orthopnea. He has chronic problems sleeping at night. He is uncertain whether not CPAP seems to help much. He also has chronic problems with reflux, chronic cough and chronic sinus congestion.  He has been chronically anticoagulated using Xarelto without any significant bleeding complications.   Past Medical History:  Diagnosis Date  . Atrial flutter ([place])    typical appearing; s/p ablation 12-2012  . Depression    used to see psych, on no meds as of 03/2009  . Diabetes mellitus    TYPE 2  . Dysrhythmia   . GERD (gastroesophageal reflux disease)   . Gout   . Hyperlipidemia   . Hypothyroidism   . Persistent atrial fibrillation ([place])    PVI 12/2012  . Rhinitis    vasomotor  . Sleep apnea    mild, now treated with CPAP since ~9-12    Past Surgical History:  Procedure Laterality Date  . ABLATION OF DYSRHYTHMIC FOCUS  01/05/2013   PVI and flutter  ablation by [name]  . ATRIAL FIBRILLATION ABLATION N/A 01/05/2013   Procedure: ATRIAL FIBRILLATION ABLATION;  Surgeon: Nery Grayer, MD;  Location: [facility] CATH LAB;  Service: Cardiovascular;  Laterality: N/A;  . CARDIAC CATHETERIZATION N/A 01/12/2016   Procedure: Right/Left Heart Cath and Coronary Angiography;  Surgeon: [name], MD;  Location: [place] CV LAB;  Service: Cardiovascular;  Laterality: N/A;  . CARDIOVERSION  03/01/11  . NASAL SINUS SURGERY    .  TEE WITHOUT CARDIOVERSION N/A 01/04/2013   Procedure: TRANSESOPHAGEAL ECHOCARDIOGRAM (TEE);  Surgeon: [name], MD;  Location: [facility] ENDOSCOPY;  Service: Cardiovascular;  Laterality: N/A;  . TONSILLECTOMY    . VASECTOMY      Family History  Problem Relation Age of Onset  . Asthma Mother   . Diabetes Father   . Hypertension Brother   . Melanoma Brother   . Allergies Daughter   . Throat cancer Maternal Uncle   . Colon cancer Paternal Grandfather   . Prostate cancer Neg Hx     Social History Social History  Substance Use Topics  . Smoking status: Never Smoker  . Smokeless tobacco: Never Used  . Alcohol use No    Prior to Admission medications   Medication Sig Start Date End Date Taking? Authorizing Provider  allopurinol (ZYLOPRIM) 300 MG tablet Take 1 tablet (300 mg total) by mouth daily. 11/12/15  Yes Colon Branch, MD  amiodarone (PACERONE) 200 MG tablet Take 1 tablet (200 mg total) by mouth 2 (two) times daily with a meal. Patient taking differently: Take 200 mg by mouth 2 (two) times daily with a meal. ONLY TAKING BEFORE THE PROCEDURE 03/01/16  Yes [name], MD  atorvastatin (LIPITOR) 10 MG tablet Take 1 tablet (10 mg total) by mouth daily. 01/01/16  Yes Elayne Snare, MD  azelastine (ASTELIN) 0.1 % nasal spray Place 2 sprays into both nostrils 2 (two) times daily. 05/16/15  Yes Colon Branch, MD  canagliflozin [facility]) 300 MG TABS tablet Take 1 tablet (300 mg total) by mouth daily before breakfast. 03/02/16  Yes Elayne Snare, MD  dexlansoprazole (DEXILANT) 60 MG capsule Take 1 capsule (60 mg total) by mouth daily. 03/12/15  Yes Colon Branch, MD  diazepam (VALIUM) 5 MG tablet Take 1 tablet (5 mg total) by mouth every 6 (six) hours as needed for anxiety. 03/02/16  Yes Grace Isaac, MD  diltiazem (CARDIZEM CD) 240 MG 24 hr capsule Take 1 capsule by mouth  daily 09/05/15  Yes Barris Grayer, MD  diphenhydrAMINE (BENADRYL) 25 mg capsule Take 25-50 mg by mouth every 4 (four) hours as needed  for allergies.   Yes Historical Provider, MD  famotidine (PEPCID) 20 MG tablet Take 1 tablet (20 mg total) by mouth at bedtime. 11/12/15  Yes Colon Branch, MD  fenofibrate 160 MG tablet Take 1 tablet (160 mg total) by mouth daily. 08/07/15  Yes Colon Branch, MD  fluticasone [facility]) 50 MCG/ACT nasal spray Place 2 sprays into both nostrils daily. 03/12/15  Yes Colon Branch, MD  glimepiride (AMARYL) 4 MG tablet Take 1 tablet (4 mg total) by mouth daily with breakfast. 03/02/16  Yes Elayne Snare, MD  levothyroxine (SYNTHROID, LEVOTHROID) 200 MCG tablet Take 1 tablet (200 mcg total) by mouth daily before breakfast. 11/12/15  Yes Colon Branch, MD  lisinopril (PRINIVIL,ZESTRIL) 5 MG tablet Take 1 tablet (5 mg total) by mouth daily. 11/24/15  Yes Kann Grayer, MD  metFORMIN (GLUCOPHAGE) 1000 MG tablet Take 1  tablet (1,000 mg total) by mouth 2 (two) times daily with a meal. 03/02/16  Yes Elayne Snare, MD  metoprolol succinate (TOPROL-XL) 100 MG 24 hr tablet Take 1 tablet (100 mg total) by mouth daily. 12/12/15  Yes Kruse Grayer, MD  omega-3 acid ethyl esters (LOVAZA) 1 g capsule Take 2 capsules by mouth  two times daily  (4 gram total daily) 01/01/16  Yes Elayne Snare, MD  rivaroxaban (XARELTO) 20 MG TABS tablet Take 1 tablet by mouth at  bedtime 01/13/16  Yes [name], MD    Allergies  Allergen Reactions  . No Known Allergies      Review of Systems:              General:                      normal appetite, decreased energy, no weight gain, + slight weight loss, no fever             Cardiac:                       no chest pain with exertion, no chest pain at rest, + SOB with exertion, no resting SOB, no PND, no orthopnea, + palpitations, + arrhythmia, +        atrial fibrillation, no LE edema, no dizzy spells, no syncope             Respiratory:                 no shortness of breath, no home oxygen, no productive cough, no dry cough, no bronchitis, no wheezing, no hemoptysis, no asthma, no pain with inspiration  or cough, + sleep apnea, + CPAP at night             GI:                               no difficulty swallowing, + reflux, no frequent heartburn, no hiatal hernia, no abdominal pain, no constipation, no diarrhea, no hematochezia, no hematemesis, no melena             GU:                              no dysuria,  no frequency, no urinary tract infection, no hematuria, no enlarged prostate, no kidney stones, no kidney disease             Vascular:                     no pain suggestive of claudication, no pain in feet, no leg cramps, no varicose veins, no DVT, no non-healing foot ulcer             Neuro:                         no stroke, no TIA's, no seizures, no headaches, no temporary blindness one eye,  no slurred speech, no peripheral neuropathy, no chronic pain, no instability of gait, no memory/cognitive dysfunction             Musculoskeletal:         no arthritis, no joint swelling, no myalgias, no difficulty walking, normal mobility              Skin:  no rash, no itching, no skin infections, no pressure sores or ulcerations             Psych:                         no anxiety, no depression, no nervousness, no unusual recent stress             Eyes:                           no blurry vision, no floaters, no recent vision changes, no wears glasses or contacts             ENT:                            no hearing loss, no loose or painful teeth, no dentures, last saw dentist < 6 months ago, + chronic sinus congestion             Hematologic:               no easy bruising, no abnormal bleeding, no clotting disorder, no frequent epistaxis             Endocrine:                   + diabetes, does check CBG's at home, last Hgb a1c reportedly 7.8                           Physical Exam:              BP 114/82 (BP Location: Left Arm, Patient Position: Sitting, Cuff Size: Large)   Pulse (!) 108   Resp 18   Ht 6\' 2"  (1.88 m)   Wt 255 lb (115.7 kg)   SpO2 97%  Comment: RA  BMI 32.74 kg/m              General:                      Moderately obese,  well-appearing             HEENT:                       Unremarkable              Neck:                           no JVD, no bruits, no adenopathy              Chest:                          clear to auscultation, symmetrical breath sounds, no wheezes, no rhonchi              CV:                              Irregular rate and rhythm, no murmur              Abdomen:                    soft, non-tender, no masses  Extremities:                 warm, well-perfused, pulses diminished but palpable, no LE edema             Rectal/GU                   Deferred             Neuro:                         Grossly non-focal and symmetrical throughout             Skin:                            Clean and dry, no rashes, no breakdown   Diagnostic Tests:  Echocardiography  Patient: Irfan, Kopplin MR #: [id number] Study Date: 09/25/2015 Gender: M Age: 1 Height: 188 cm Weight: 117.3 kg BSA: 2.51 m^2 Pt. Status: Room:  ATTENDING Candee Furbish, M.D. ORDERING Coate Grayer, MD REFERRING [place] Grayer, MD PERFORMING [facility], Outpatient SONOGRAPHER [facility], RDCS  cc:  ------------------------------------------------------------------- LV EF: 35% - 40%  ------------------------------------------------------------------- Indications: Atrial Fibrillation (I48.91).  ------------------------------------------------------------------- History: PMH: Obstructive Sleep Apnea, PFO detected by TEE and Bubble. Atrial fibrillation. Atrial flutter. Risk factors: Diabetes mellitus. Dyslipidemia.  ------------------------------------------------------------------- Study Conclusions  - Left ventricle: The cavity size was normal. Wall thickness was normal. Systolic function was moderately reduced. The  estimated ejection fraction was in the range of 35% to 40%. There is akinesis of the mid-apicalanteroseptal myocardium. - Aorta: Aortic root dimension: 41 mm (ED). - Ascending aorta: The ascending aorta was mildly dilated. - Mitral valve: Calcified annulus. There was mild regurgitation. - Left atrium: The atrium was moderately dilated. Anterior-posterior dimension: 48 mm. Volume/bsa, ES, (1-plane Simpson&'s, A2C): 43.5 ml/m^2.  Impressions:  - Prior echocardiogram - normal EF.  Echocardiography. M-mode, complete 2D, spectral Doppler, and color Doppler. Birthdate: Patient birthdate: 1960-10-04. Age: Patient is 55 yr old. Sex: Gender: male. BMI: 33.2 kg/m^2. Blood pressure: 118/74 Patient status: Outpatient. Study date: Study date: 09/25/2015. Study time: 07:23 AM. Location: [facility]  -------------------------------------------------------------------  ------------------------------------------------------------------- Left ventricle: The cavity size was normal. Wall thickness was normal. Systolic function was moderately reduced. The estimated ejection fraction was in the range of 35% to 40%. Regional wall motion abnormalities: There is akinesis of the mid-apicalanteroseptal myocardium.  ------------------------------------------------------------------- Aortic valve: Trileaflet; normal thickness leaflets. Mobility was not restricted. Doppler: Transvalvular velocity was within the normal range. There was no stenosis. There was no regurgitation.  ------------------------------------------------------------------- Aorta: Ascending aorta: The ascending aorta was mildly dilated.  ------------------------------------------------------------------- Mitral valve: Calcified annulus. Mobility was not restricted. Doppler: Transvalvular velocity was within the normal range. There was no evidence for stenosis. There was mild  regurgitation.  ------------------------------------------------------------------- Left atrium: The atrium was moderately dilated.  ------------------------------------------------------------------- Right ventricle: The cavity size was normal. Wall thickness was normal. Systolic function was normal.  ------------------------------------------------------------------- Pulmonic valve: Doppler: Transvalvular velocity was within the normal range. There was no evidence for stenosis. There was trivial regurgitation.  ------------------------------------------------------------------- Tricuspid valve: Structurally normal valve. Doppler: Transvalvular velocity was within the normal range. There was mild regurgitation.  ------------------------------------------------------------------- Pulmonary artery: The main pulmonary artery was normal-sized. Systolic pressure was within the normal range.  ------------------------------------------------------------------- Right atrium: The atrium was normal in size.  ------------------------------------------------------------------- Pericardium: There was no pericardial effusion.  ------------------------------------------------------------------- Systemic veins: Inferior vena cava: The  vessel was normal in size. The respirophasic diameter changes were in the normal range (= 50%), consistent with normal central venous pressure. Diameter: 16.6 mm.  ------------------------------------------------------------------- Measurements  IVC Value Reference ID 16.6 mm ---------  Left ventricle Value Reference LV ID, ED, PLAX chordal (H) 57.1 mm 43 - 52 LV ID, ES, PLAX chordal (H) 45.1 mm 23 - 38 LV fx shortening, PLAX chordal (L) 21 % >=29 LV PW thickness, ED  10.3 mm --------- IVS/LV PW ratio, ED 1.12 <=1.3 Stroke volume, 2D 96 ml --------- Stroke volume/bsa, 2D 38 ml/m^2 ---------  Ventricular septum Value Reference IVS thickness, ED 11.5 mm ---------  LVOT Value Reference LVOT ID, S 27 mm --------- LVOT area 5.73 cm^2 --------- LVOT peak velocity, S 83 cm/s --------- LVOT mean velocity, S 56.5 cm/s --------- LVOT VTI, S 16.7 cm ---------  Aorta Value Reference Aortic root ID, ED 41 mm --------- Ascending aorta ID, A-P, S 37 mm ---------  Left atrium Value Reference LA ID, A-P, ES 48 mm --------- LA ID/bsa, A-P 1.91 cm/m^2 <=2.2 LA volume, S 86.6 ml --------- LA volume/bsa, S 34.5 ml/m^2 --------- LA volume, ES, 1-p A4C 68 ml --------- LA volume/bsa, ES, 1-p A4C 27.1 ml/m^2 --------- LA volume, ES, 1-p A2C 109 ml --------- LA volume/bsa, ES, 1-p A2C 43.5 ml/m^2 ---------  Tricuspid valve Value Reference Tricuspid regurg peak velocity 226 cm/s --------- Tricuspid peak RV-RA gradient 20 mm Hg ---------  Right ventricle Value Reference TAPSE 16.7 mm ---------  Legend: (L) and (H) mark values outside specified reference  range.  ------------------------------------------------------------------- Prepared and Electronically Authenticated by  Candee Furbish, M.D. 2017-04-27T15:01:12   Right/Left Heart Cath and Coronary Angiography  Conclusion   1. No angiographically significant coronary artery disease. 2. Normal left and right heart filling pressures. 3. Normal cardiac output.  Plan: 1. Continue management of atrial fibrillation per [name].  Indications   Persistent atrial fibrillation (HCC) [I48.1 (ICD-10-CM)]  Shortness of breath [R06.02 (ICD-10-CM)]  Procedural Details/Technique   Technical Details Indication: 55 y/o man with history of recurrent persistent atrial fibrillation s/p ablation referred for LHC/RHC in anticipation of possible Maze procedure. Patient reports fatigue with accompanying decline in LVEF following recurrence of atrial fibrillation.  Procedure: The risks, benefits, complications, treatment options, and expected outcomes were discussed with the patient. The patient and/or family concurred with the proposed plan, giving informed consent. The patient was brought to the cath lab after IV hydration was begun and oral premedication was given. The patient was further sedated with Versed and Fentanyl. The right groin was prepped and draped in the usual manner. Using the modified Seldinger access technique, a 5 French sheath was placed in the right femoral artery.  Selective coronary angiography was performed using 5 [place] JL4 and JR4 catheters to engage the left and right coronary arteries, respectively. Left heart catheterization was performed using a 5 Fench pigtail catheter.  There were no immediate complications. The patient was taken to the recovery area in stable condition.    Estimated blood loss <50 mL. . During this procedure the patient was administered the following to achieve and maintain moderate conscious sedation: Versed 2 mg, Fentanyl 100 mcg, while the  patient's heart rate, blood pressure, and oxygen saturation were continuously monitored. The period of conscious sedation was 43 minutes, of which I was present face-to-face 100% of this time.    Coronary Findings   Dominance: Right  Left Anterior Descending  Vessel is large.  First Diagonal Branch  Vessel is moderate in  size.  Second Diagonal Branch  Vessel is small in size.  Third Diagonal Branch  Vessel is small in size.  Ramus Intermedius  Vessel is small.  Left Circumflex  Vessel is large.  First Obtuse Marginal Branch  Vessel is small in size.  Second Obtuse Marginal Branch  Vessel is large in size.  Third Obtuse Marginal Branch  Vessel is moderate in size.  Right Coronary Artery  Vessel is large.  Right Posterior Descending Artery  Vessel is large in size.  Right Heart   Right Heart Pressures Normal left and right heart filling pressures. Normal cardiac output/index.    Left Heart   Left Ventricle LV end diastolic pressure is normal.    Coronary Diagrams   Diagnostic Diagram     Implants        No implant documentation for this case.  PACS Images   Show images for Cardiac catheterization   Link to Procedure Log   Procedure Log    Hemo Data   Flowsheet Row Most Recent Value  Fick Cardiac Output 5.43 L/min  Fick Cardiac Output Index 2.25 (L/min)/BSA  Thermal Cardiac Output 7.57 L/min  Thermal Cardiac Output Index 3.14 (L/min)/BSA  RA A Wave -99 mmHg  RA V Wave 8 mmHg  RA Mean 5 mmHg  RV Systolic Pressure 31 mmHg  RV Diastolic Pressure 3 mmHg  RV EDP 8 mmHg  PA Systolic Pressure 27 mmHg  PA Diastolic Pressure 7 mmHg  PA Mean 18 mmHg  PW A Wave -99 mmHg  PW V Wave 12 mmHg  PW Mean 11 mmHg  AO Systolic Pressure [id number] mmHg  AO Diastolic Pressure 75 mmHg  AO Mean 92 mmHg  LV Systolic Pressure [id number] mmHg  LV Diastolic Pressure 5 mmHg  LV EDP 9 mmHg  Arterial Occlusion Pressure Extended Systolic Pressure [id number] mmHg  Arterial Occlusion  Pressure Extended Diastolic Pressure 78 mmHg  Arterial Occlusion Pressure Extended Mean Pressure 91 mmHg  Left Ventricular Apex Extended Systolic Pressure [id number] mmHg  Left Ventricular Apex Extended Diastolic Pressure 5 mmHg  Left Ventricular Apex Extended EDP Pressure 10 mmHg  TPVR Index 5.72 HRUI  TSVR Index 29.29 HRUI  PVR SVR Ratio 0.08  TPVR/TSVR Ratio 0.2     EKG:   Afib w/ increased ventricular rate, no acute ischemic changes, partial RBBB     CT ANGIOGRAPHY CHEST, ABDOMEN AND PELVIS  TECHNIQUE: Multidetector CT imaging through the chest, abdomen and pelvis was performed using the standard protocol during bolus administration of intravenous contrast. Multiplanar reconstructed images and MIPs were obtained and reviewed to evaluate the vascular anatomy.  CONTRAST: 75 mL Isovue 370  COMPARISON: None.  FINDINGS: CTA CHEST FINDINGS  Cardiovascular: Truncus anomaly of the thoracic aorta is noted. The origins of the brachiocephalic vessels are otherwise within normal limits. No aneurysmal dilatation of the ascending aorta is seen. No evidence of dissection is noted. The pulmonary artery as visualized is within normal limits. Normal superior and inferior pulmonary veins are noted bilaterally. No filling defect is noted within the left atrium. A moderate size left atrial appendage is seen. No significant coronary calcifications are noted. Left main coronary artery and left anterior descending coronary artery is have a normal course posterior to the pulmonary artery. No downward course is identified.  Mediastinum/Nodes: Thoracic inlet is within normal limits. No hilar or mediastinal adenopathy is identified. No significant axillary adenopathy is noted. The esophagus as visualize is within normal limits.  Lungs/Pleura: Well-aerated bilaterally without focal infiltrate, effusion or  pneumothorax. No sizable parenchymal nodule is seen.  Musculoskeletal: Mild  degenerative change of the thoracic spine is noted. No compression deformities are noted.  Review of the MIP images confirms the above findings.  CTA ABDOMEN AND PELVIS FINDINGS  VASCULAR  Aorta: The abdominal aorta is well visualized and demonstrates no aneurysmal dilatation. Normal bifurcation of the common iliac arteries is noted bilaterally. No significant aneurysmal dilatation it is seen. Minimal atherosclerotic calcifications are noted.  Celiac: Widely patent  SMA: Widely patent  Renals: Single renal arteries are identified bilaterally which are widely patent.  IMA: Widely patent.  Outflow: The left common iliac artery measures approximately 16 mm in greatest dimension. The right common iliac artery measures approximately 13 mm in greatest dimension. The common femoral arteries are relatively symmetrical measuring approximately 12 mm in diameter.  Veins: The IVC is well visualized and within normal limits. No significant narrowing is seen. No definitive portal or hepatic venous abnormality is noted although visualization is limited due to the timing of the contrast bolus.  Review of the MIP images confirms the above findings.  NON-VASCULAR  Hepatobiliary: Diffuse fatty infiltration of the liver is noted. The gallbladder is within normal limits.  Pancreas: Unremarkable. No pancreatic ductal dilatation or surrounding inflammatory changes.  Spleen: Normal in size without focal abnormality.  Adrenals/Urinary Tract: The adrenal glands are within normal limits. No renal calculi or obstructive changes are seen. Bilateral renal cystic changes are noted.  Stomach/Bowel: Stomach is within normal limits. Appendix appears normal. No evidence of bowel wall thickening, distention, or inflammatory changes. Scattered mild diverticular change is noted.  Lymphatic: No significant lymphadenopathy is identified.  Reproductive: Prostate is  unremarkable.  Other: No abdominal wall hernia or abnormality. No abdominopelvic ascites.  Musculoskeletal: No acute or significant osseous findings.  Review of the MIP images confirms the above findings.  IMPRESSION: No evidence of aortic dissection or aneurysmal dilatation. Mild atherosclerotic calcifications are noted within the abdominal aorta.  Normal pulmonary venous anatomy. Left atrial appendage is noted. No thrombus is seen.  Fatty liver.  Bilateral renal cystic change.   Electronically Signed By: Inez Catalina M.D. On: 03/01/2016 16:37   Impression:  Patient has chronic persistent atrial fibrillation that has failed medical therapy on multiple agents, DC cardioversion, and previous catheter-based AFibablation. He remains symptomatic with frequent palpitations, decreased energy, and exertional shortness of breath on current medical therapy. Recent transthoracic echocardiogram demonstrates further enlargement of the left atrium and decreased left ventricular ejection fraction in comparison with previous echocardiograms. The patient does not appear to have significant structural heart disease and diagnostic cardiac catheterization is notable for the absence of significant coronary artery disease. The patient is obese and has obstructive sleep apnea. Options include continued medical therapy with long-term rate control and anticoagulation versus a second attempt at catheter-based ablation versus surgical Maze procedure.  CT angiogram of the aorta and iliac vessels revealed no contraindications to peripheral cannulation for surgery.   Plan:  I have again discussed the indications, risks, and potential benefits of Maze procedure with the patient and his wife in the office today. Alternative treatment strategies of been discussed including continued medical therapy versus another attempt at catheter-based ablation. The impact of comorbid medical problems  including obesity and obstructive sleep apnea were discussed. Alternative surgical approaches for ablation have been discussed including those performed with and without the use of cardiopulmonary bypass anda comparison between conventional sternotomy and minimally-invasive techniques. The expected likelihood of long term freedom from recurrent symptomatic atrial fibrillation  and/or atrial flutter following maze procedure has been discussed. The relative risks and benefits of each have been reviewed as they pertain to the patient's specific circumstances, and all of their questions have been addressed. The patient understands and accepts all potential associated risks of surgery including but not limited to risk of death, stroke, myocardial infarction, congestive heart failure, respiratory failure, renal failure, bleeding requiring blood transfusion and/or reexploration, arrhythmia, heart block or bradycardia requiring permanent pacemaker, pneumonia, pleural effusion, wound infection, pulmonary embolus or other thromboembolic complication, chronic pain or other delayed complications. Specific risks potentially related to the minimally-invasive approach were discussed at length, including but not limited to risk of conversion to full or partial sternotomy, aortic dissection or other major vascular complication, unilateral acute lung injury or pulmonary edema, phrenic nerve dysfunction or paralysis, rib fracture, chronic pain, lung hernia, or lymphocele.  We plan to proceed with surgery on Friday, 03/12/2016. The patient has been instructed to stop taking Xarelto 1 week prior to surgery. We discussed whether or not to use bridging therapy with Lovenox injections, and the patient does not wish to do so. Under the circumstances I feel that bridging therapy is not necessary. The patient has been given a prescription for amiodarone to begin 1 week prior to surgery.  Expectations for the patient's postoperative  convalescence have been discussed in detail. All of his questions have been addressed.      I spent in excess of 30 minutes during the conduct of this office consultation and >50% of this time involved direct face-to-face encounter with the patient for counseling and/or coordination of their care.   [name]. Roxy Manns, MD 03/01/2016 4:47 PM

## 2016-03-11 NOTE — Progress Notes (Signed)
Pre-op Cardiac Surgery  Carotid Findings:  Bilateral: No significant (1-39%) ICA stenosis. Antegrade vertebral flow.    Upper Extremity Right Left  Brachial Pressures 105 107  Radial Waveforms Tri Tri  Ulnar Waveforms Tri Tri  Palmar Arch (Allen's Test) Obliterates with radial compression, normal with ulnar compression Normal     Atypical waveforms due to afib.    Landry Mellow, RDMS, RVT  03/11/2016

## 2016-03-12 ENCOUNTER — Inpatient Hospital Stay (HOSPITAL_COMMUNITY)
Admission: RE | Admit: 2016-03-12 | Discharge: 2016-03-19 | DRG: 229 | Disposition: A | Discharge status: Home / Self Care | Payer: 59 | Source: Ambulatory Visit | Attending: Thoracic Surgery (Cardiothoracic Vascular Surgery) | Admitting: Thoracic Surgery (Cardiothoracic Vascular Surgery)

## 2016-03-12 ENCOUNTER — Encounter (HOSPITAL_COMMUNITY): Payer: Self-pay | Admitting: *Deleted

## 2016-03-12 ENCOUNTER — Encounter (HOSPITAL_COMMUNITY)
Admission: RE | Disposition: A | Discharge status: Home / Self Care | Payer: Self-pay | Source: Ambulatory Visit | Attending: Thoracic Surgery (Cardiothoracic Vascular Surgery)

## 2016-03-12 ENCOUNTER — Inpatient Hospital Stay (HOSPITAL_COMMUNITY): Payer: 59

## 2016-03-12 ENCOUNTER — Inpatient Hospital Stay (HOSPITAL_COMMUNITY): Payer: 59 | Admitting: Anesthesiology

## 2016-03-12 DIAGNOSIS — Z7984 Long term (current) use of oral hypoglycemic drugs: Secondary | ICD-10-CM

## 2016-03-12 DIAGNOSIS — D62 Acute posthemorrhagic anemia: Secondary | ICD-10-CM | POA: Diagnosis not present

## 2016-03-12 DIAGNOSIS — K219 Gastro-esophageal reflux disease without esophagitis: Secondary | ICD-10-CM | POA: Diagnosis present

## 2016-03-12 DIAGNOSIS — G4733 Obstructive sleep apnea (adult) (pediatric): Secondary | ICD-10-CM | POA: Diagnosis present

## 2016-03-12 DIAGNOSIS — Z6831 Body mass index (BMI) 31.0-31.9, adult: Secondary | ICD-10-CM | POA: Diagnosis not present

## 2016-03-12 DIAGNOSIS — F329 Major depressive disorder, single episode, unspecified: Secondary | ICD-10-CM | POA: Diagnosis present

## 2016-03-12 DIAGNOSIS — J9811 Atelectasis: Secondary | ICD-10-CM

## 2016-03-12 DIAGNOSIS — Z9689 Presence of other specified functional implants: Secondary | ICD-10-CM

## 2016-03-12 DIAGNOSIS — Q211 Atrial septal defect: Secondary | ICD-10-CM

## 2016-03-12 DIAGNOSIS — R0602 Shortness of breath: Secondary | ICD-10-CM

## 2016-03-12 DIAGNOSIS — E039 Hypothyroidism, unspecified: Secondary | ICD-10-CM | POA: Diagnosis present

## 2016-03-12 DIAGNOSIS — E669 Obesity, unspecified: Secondary | ICD-10-CM | POA: Diagnosis present

## 2016-03-12 DIAGNOSIS — R059 Cough, unspecified: Secondary | ICD-10-CM | POA: Diagnosis present

## 2016-03-12 DIAGNOSIS — J939 Pneumothorax, unspecified: Secondary | ICD-10-CM | POA: Diagnosis present

## 2016-03-12 DIAGNOSIS — E119 Type 2 diabetes mellitus without complications: Secondary | ICD-10-CM

## 2016-03-12 DIAGNOSIS — E785 Hyperlipidemia, unspecified: Secondary | ICD-10-CM | POA: Diagnosis present

## 2016-03-12 DIAGNOSIS — IMO0002 Reserved for concepts with insufficient information to code with codable children: Secondary | ICD-10-CM | POA: Diagnosis present

## 2016-03-12 DIAGNOSIS — Z7901 Long term (current) use of anticoagulants: Secondary | ICD-10-CM | POA: Diagnosis not present

## 2016-03-12 DIAGNOSIS — K5900 Constipation, unspecified: Secondary | ICD-10-CM | POA: Diagnosis not present

## 2016-03-12 DIAGNOSIS — Z800 Family history of malignant neoplasm of digestive organs: Secondary | ICD-10-CM

## 2016-03-12 DIAGNOSIS — I4892 Unspecified atrial flutter: Secondary | ICD-10-CM | POA: Diagnosis present

## 2016-03-12 DIAGNOSIS — R05 Cough: Secondary | ICD-10-CM | POA: Diagnosis present

## 2016-03-12 DIAGNOSIS — I5032 Chronic diastolic (congestive) heart failure: Secondary | ICD-10-CM | POA: Diagnosis present

## 2016-03-12 DIAGNOSIS — I481 Persistent atrial fibrillation: Principal | ICD-10-CM | POA: Diagnosis present

## 2016-03-12 DIAGNOSIS — Z8679 Personal history of other diseases of the circulatory system: Secondary | ICD-10-CM

## 2016-03-12 DIAGNOSIS — Z9889 Other specified postprocedural states: Secondary | ICD-10-CM

## 2016-03-12 DIAGNOSIS — I4891 Unspecified atrial fibrillation: Secondary | ICD-10-CM | POA: Diagnosis present

## 2016-03-12 DIAGNOSIS — Z8774 Personal history of (corrected) congenital malformations of heart and circulatory system: Secondary | ICD-10-CM

## 2016-03-12 DIAGNOSIS — E1165 Type 2 diabetes mellitus with hyperglycemia: Secondary | ICD-10-CM | POA: Diagnosis present

## 2016-03-12 HISTORY — DX: Personal history of other diseases of the circulatory system: Z86.79

## 2016-03-12 HISTORY — DX: Personal history of (corrected) congenital malformations of heart and circulatory system: Z87.74

## 2016-03-12 HISTORY — PX: MINIMALLY INVASIVE MAZE PROCEDURE: SHX6244

## 2016-03-12 HISTORY — DX: Other specified postprocedural states: Z98.890

## 2016-03-12 HISTORY — PX: CLIPPING OF ATRIAL APPENDAGE: SHX5773

## 2016-03-12 HISTORY — PX: TEE WITHOUT CARDIOVERSION: SHX5443

## 2016-03-12 LAB — CBC
HCT: 45.8 % (ref 39.0–52.0)
HCT: 40.5 % (ref 39.0–52.0)
HEMOGLOBIN: 14.2 g/dL (ref 13.0–17.0)
Hemoglobin: 16.6 g/dL (ref 13.0–17.0)
MCH: 29.6 pg (ref 26.0–34.0)
MCH: 28.9 pg (ref 26.0–34.0)
MCHC: 36.2 g/dL — ABNORMAL HIGH (ref 30.0–36.0)
MCHC: 35.1 g/dL (ref 30.0–36.0)
MCV: 81.6 fL (ref 78.0–100.0)
MCV: 82.5 fL (ref 78.0–100.0)
PLATELETS: 174 10*3/uL (ref 150–400)
Platelets: 187 10*3/uL (ref 150–400)
RBC: 5.61 MIL/uL (ref 4.22–5.81)
RBC: 4.91 MIL/uL (ref 4.22–5.81)
RDW: 13 % (ref 11.5–15.5)
RDW: 13.2 % (ref 11.5–15.5)
WBC: 15.7 10*3/uL — AB (ref 4.0–10.5)
WBC: 14.2 10*3/uL — AB (ref 4.0–10.5)

## 2016-03-12 LAB — POCT I-STAT 3, ART BLOOD GAS (G3+)
ACID-BASE DEFICIT: 5 mmol/L — AB (ref 0.0–2.0)
ACID-BASE DEFICIT: 2 mmol/L (ref 0.0–2.0)
ACID-BASE EXCESS: 2 mmol/L (ref 0.0–2.0)
ACID-BASE EXCESS: 3 mmol/L — AB (ref 0.0–2.0)
Acid-base deficit: 3 mmol/L — ABNORMAL HIGH (ref 0.0–2.0)
Acid-base deficit: 4 mmol/L — ABNORMAL HIGH (ref 0.0–2.0)
BICARBONATE: 27.5 mmol/L (ref 20.0–28.0)
BICARBONATE: 23.1 mmol/L (ref 20.0–28.0)
BICARBONATE: 24.4 mmol/L (ref 20.0–28.0)
BICARBONATE: 21.5 mmol/L (ref 20.0–28.0)
Bicarbonate: 22.6 mmol/L (ref 20.0–28.0)
Bicarbonate: 26.4 mmol/L (ref 20.0–28.0)
Bicarbonate: 23.2 mmol/L (ref 20.0–28.0)
O2 SAT: 99 %
O2 SAT: 100 %
O2 SAT: 100 %
O2 SAT: 96 %
O2 Saturation: 97 %
O2 Saturation: 97 %
O2 Saturation: 94 %
PCO2 ART: 42.4 mmHg (ref 32.0–48.0)
PCO2 ART: 39.3 mmHg (ref 32.0–48.0)
PCO2 ART: 41.9 mmHg (ref 32.0–48.0)
PH ART: 7.344 — AB (ref 7.350–7.450)
PH ART: 7.408 (ref 7.350–7.450)
PH ART: 7.323 — AB (ref 7.350–7.450)
PO2 ART: 140 mmHg — AB (ref 83.0–108.0)
PO2 ART: 278 mmHg — AB (ref 83.0–108.0)
PO2 ART: 91 mmHg (ref 83.0–108.0)
PO2 ART: 101 mmHg (ref 83.0–108.0)
Patient temperature: 38.6
TCO2: 24 mmol/L (ref 0–100)
TCO2: 28 mmol/L (ref 0–100)
TCO2: 29 mmol/L (ref 0–100)
TCO2: 24 mmol/L (ref 0–100)
TCO2: 24 mmol/L (ref 0–100)
TCO2: 26 mmol/L (ref 0–100)
TCO2: 23 mmol/L (ref 0–100)
pCO2 arterial: 52.3 mmHg — ABNORMAL HIGH (ref 32.0–48.0)
pCO2 arterial: 39.9 mmHg (ref 32.0–48.0)
pCO2 arterial: 42.2 mmHg (ref 32.0–48.0)
pCO2 arterial: 40.7 mmHg (ref 32.0–48.0)
pH, Arterial: 7.243 — ABNORMAL LOW (ref 7.350–7.450)
pH, Arterial: 7.422 (ref 7.350–7.450)
pH, Arterial: 7.429 (ref 7.350–7.450)
pH, Arterial: 7.368 (ref 7.350–7.450)
pO2, Arterial: 178 mmHg — ABNORMAL HIGH (ref 83.0–108.0)
pO2, Arterial: 87 mmHg (ref 83.0–108.0)
pO2, Arterial: 80 mmHg — ABNORMAL LOW (ref 83.0–108.0)

## 2016-03-12 LAB — POCT I-STAT 3, VENOUS BLOOD GAS (G3P V)
Acid-Base Excess: 1 mmol/L (ref 0.0–2.0)
Bicarbonate: 26.4 mmol/L (ref 20.0–28.0)
O2 Saturation: 33 %
PCO2 VEN: 44.4 mmHg (ref 44.0–60.0)
PH VEN: 7.382 (ref 7.250–7.430)
TCO2: 28 mmol/L (ref 0–100)
pO2, Ven: 21 mmHg — CL (ref 32.0–45.0)

## 2016-03-12 LAB — POCT I-STAT, CHEM 8
BUN: 20 mg/dL (ref 6–20)
BUN: 18 mg/dL (ref 6–20)
BUN: 19 mg/dL (ref 6–20)
BUN: 19 mg/dL (ref 6–20)
BUN: 20 mg/dL (ref 6–20)
BUN: 22 mg/dL — ABNORMAL HIGH (ref 6–20)
BUN: 19 mg/dL (ref 6–20)
BUN: 15 mg/dL (ref 6–20)
CALCIUM ION: 1.24 mmol/L (ref 1.15–1.40)
CALCIUM ION: 1.22 mmol/L (ref 1.15–1.40)
CALCIUM ION: 1.07 mmol/L — AB (ref 1.15–1.40)
CALCIUM ION: 1.04 mmol/L — AB (ref 1.15–1.40)
CALCIUM ION: 1.14 mmol/L — AB (ref 1.15–1.40)
CHLORIDE: 103 mmol/L (ref 101–111)
CHLORIDE: 99 mmol/L — AB (ref 101–111)
CHLORIDE: 105 mmol/L (ref 101–111)
CREATININE: 0.8 mg/dL (ref 0.61–1.24)
CREATININE: 0.9 mg/dL (ref 0.61–1.24)
CREATININE: 0.9 mg/dL (ref 0.61–1.24)
CREATININE: 0.9 mg/dL (ref 0.61–1.24)
CREATININE: 0.9 mg/dL (ref 0.61–1.24)
CREATININE: 0.9 mg/dL (ref 0.61–1.24)
Calcium, Ion: 1.13 mmol/L — ABNORMAL LOW (ref 1.15–1.40)
Calcium, Ion: 1.09 mmol/L — ABNORMAL LOW (ref 1.15–1.40)
Calcium, Ion: 1.09 mmol/L — ABNORMAL LOW (ref 1.15–1.40)
Chloride: 103 mmol/L (ref 101–111)
Chloride: 99 mmol/L — ABNORMAL LOW (ref 101–111)
Chloride: 100 mmol/L — ABNORMAL LOW (ref 101–111)
Chloride: 101 mmol/L (ref 101–111)
Chloride: 97 mmol/L — ABNORMAL LOW (ref 101–111)
Creatinine, Ser: 1 mg/dL (ref 0.61–1.24)
Creatinine, Ser: 1 mg/dL (ref 0.61–1.24)
GLUCOSE: 146 mg/dL — AB (ref 65–99)
GLUCOSE: 152 mg/dL — AB (ref 65–99)
GLUCOSE: 157 mg/dL — AB (ref 65–99)
GLUCOSE: 176 mg/dL — AB (ref 65–99)
Glucose, Bld: 159 mg/dL — ABNORMAL HIGH (ref 65–99)
Glucose, Bld: 210 mg/dL — ABNORMAL HIGH (ref 65–99)
Glucose, Bld: 193 mg/dL — ABNORMAL HIGH (ref 65–99)
Glucose, Bld: 121 mg/dL — ABNORMAL HIGH (ref 65–99)
HCT: 37 % — ABNORMAL LOW (ref 39.0–52.0)
HCT: 31 % — ABNORMAL LOW (ref 39.0–52.0)
HCT: 37 % — ABNORMAL LOW (ref 39.0–52.0)
HCT: 29 % — ABNORMAL LOW (ref 39.0–52.0)
HCT: 39 % (ref 39.0–52.0)
HEMATOCRIT: 29 % — AB (ref 39.0–52.0)
HEMATOCRIT: 26 % — AB (ref 39.0–52.0)
HEMATOCRIT: 27 % — AB (ref 39.0–52.0)
HEMOGLOBIN: 12.6 g/dL — AB (ref 13.0–17.0)
HEMOGLOBIN: 10.5 g/dL — AB (ref 13.0–17.0)
HEMOGLOBIN: 9.9 g/dL — AB (ref 13.0–17.0)
HEMOGLOBIN: 8.8 g/dL — AB (ref 13.0–17.0)
HEMOGLOBIN: 9.9 g/dL — AB (ref 13.0–17.0)
HEMOGLOBIN: 9.2 g/dL — AB (ref 13.0–17.0)
HEMOGLOBIN: 13.3 g/dL (ref 13.0–17.0)
Hemoglobin: 12.6 g/dL — ABNORMAL LOW (ref 13.0–17.0)
POTASSIUM: 3.9 mmol/L (ref 3.5–5.1)
POTASSIUM: 5.7 mmol/L — AB (ref 3.5–5.1)
POTASSIUM: 4.8 mmol/L (ref 3.5–5.1)
POTASSIUM: 4.2 mmol/L (ref 3.5–5.1)
Potassium: 4.4 mmol/L (ref 3.5–5.1)
Potassium: 4.7 mmol/L (ref 3.5–5.1)
Potassium: 5.1 mmol/L (ref 3.5–5.1)
Potassium: 6.4 mmol/L (ref 3.5–5.1)
SODIUM: 139 mmol/L (ref 135–145)
SODIUM: 139 mmol/L (ref 135–145)
SODIUM: 137 mmol/L (ref 135–145)
SODIUM: 140 mmol/L (ref 135–145)
Sodium: 140 mmol/L (ref 135–145)
Sodium: 135 mmol/L (ref 135–145)
Sodium: 133 mmol/L — ABNORMAL LOW (ref 135–145)
Sodium: 138 mmol/L (ref 135–145)
TCO2: 27 mmol/L (ref 0–100)
TCO2: 23 mmol/L (ref 0–100)
TCO2: 24 mmol/L (ref 0–100)
TCO2: 28 mmol/L (ref 0–100)
TCO2: 26 mmol/L (ref 0–100)
TCO2: 27 mmol/L (ref 0–100)
TCO2: 24 mmol/L (ref 0–100)
TCO2: 23 mmol/L (ref 0–100)

## 2016-03-12 LAB — POCT I-STAT 4, (NA,K, GLUC, HGB,HCT)
Glucose, Bld: 102 mg/dL — ABNORMAL HIGH (ref 65–99)
HEMATOCRIT: 46 % (ref 39.0–52.0)
HEMOGLOBIN: 15.6 g/dL (ref 13.0–17.0)
Potassium: 3.5 mmol/L (ref 3.5–5.1)
SODIUM: 142 mmol/L (ref 135–145)

## 2016-03-12 LAB — CREATININE, SERUM: Creatinine, Ser: 1.08 mg/dL (ref 0.61–1.24)

## 2016-03-12 LAB — APTT: APTT: 26 s (ref 24–36)

## 2016-03-12 LAB — PROTIME-INR
INR: 1.22
PROTHROMBIN TIME: 15.5 s — AB (ref 11.4–15.2)

## 2016-03-12 LAB — GLUCOSE, CAPILLARY
GLUCOSE-CAPILLARY: 116 mg/dL — AB (ref 65–99)
Glucose-Capillary: 76 mg/dL (ref 65–99)
Glucose-Capillary: 96 mg/dL (ref 65–99)
Glucose-Capillary: 97 mg/dL (ref 65–99)
Glucose-Capillary: 121 mg/dL — ABNORMAL HIGH (ref 65–99)

## 2016-03-12 LAB — HEMOGLOBIN AND HEMATOCRIT, BLOOD
HCT: 29.8 % — ABNORMAL LOW (ref 39.0–52.0)
HEMOGLOBIN: 10.5 g/dL — AB (ref 13.0–17.0)

## 2016-03-12 LAB — MAGNESIUM: Magnesium: 2.6 mg/dL — ABNORMAL HIGH (ref 1.7–2.4)

## 2016-03-12 LAB — PLATELET COUNT: Platelets: 189 10*3/uL (ref 150–400)

## 2016-03-12 SURGERY — MAZE PROCEDURE, CARDIAC, MINIMALLY INVASIVE
Anesthesia: General | Site: Chest

## 2016-03-12 MED ORDER — POTASSIUM CHLORIDE 10 MEQ/50ML IV SOLN
10.0000 meq | INTRAVENOUS | Status: AC
  Administered 2016-03-12 (×3): 10 meq via INTRAVENOUS

## 2016-03-12 MED ORDER — PROTAMINE SULFATE 10 MG/ML IV SOLN
INTRAVENOUS | Status: DC | PRN
  Administered 2016-03-12: 420 mg via INTRAVENOUS

## 2016-03-12 MED ORDER — PROPOFOL 10 MG/ML IV BOLUS
INTRAVENOUS | Status: AC
  Filled 2016-03-12: qty 20

## 2016-03-12 MED ORDER — CHLORHEXIDINE GLUCONATE 4 % EX LIQD: 30.0000 mL | CUTANEOUS | Status: DC

## 2016-03-12 MED ORDER — ROCURONIUM BROMIDE 10 MG/ML (PF) SYRINGE
PREFILLED_SYRINGE | INTRAVENOUS | Status: DC | PRN
  Administered 2016-03-12 (×4): 50 mg via INTRAVENOUS
  Administered 2016-03-12: 30 mg via INTRAVENOUS
  Administered 2016-03-12: 10 mg via INTRAVENOUS
  Administered 2016-03-12 (×2): 30 mg via INTRAVENOUS
  Administered 2016-03-12: 50 mg via INTRAVENOUS

## 2016-03-12 MED ORDER — ACETAMINOPHEN 500 MG PO TABS
1000.0000 mg | ORAL_TABLET | Freq: Four times a day (QID) | ORAL | Status: AC
  Administered 2016-03-12 – 2016-03-17 (×19): 1000 mg via ORAL
  Filled 2016-03-12 (×19): qty 2

## 2016-03-12 MED ORDER — LACTATED RINGERS IV SOLN
INTRAVENOUS | Status: DC | PRN
  Administered 2016-03-12: 07:00:00 via INTRAVENOUS

## 2016-03-12 MED ORDER — MIDAZOLAM HCL 10 MG/2ML IJ SOLN
INTRAMUSCULAR | Status: AC
  Filled 2016-03-12: qty 2

## 2016-03-12 MED ORDER — MORPHINE SULFATE (PF) 2 MG/ML IV SOLN
1.0000 mg | INTRAVENOUS | Status: DC | PRN
  Administered 2016-03-12 (×2): 2 mg via INTRAVENOUS
  Administered 2016-03-12: 4 mg via INTRAVENOUS
  Filled 2016-03-12 (×3): qty 2

## 2016-03-12 MED ORDER — LACTATED RINGERS IV SOLN
INTRAVENOUS | Status: DC | PRN
  Administered 2016-03-12 (×2): via INTRAVENOUS

## 2016-03-12 MED ORDER — SODIUM CHLORIDE 0.9 % IR SOLN
Status: DC | PRN
  Administered 2016-03-12: 1000 mL

## 2016-03-12 MED ORDER — SUCCINYLCHOLINE CHLORIDE 200 MG/10ML IV SOSY
PREFILLED_SYRINGE | INTRAVENOUS | Status: AC
  Filled 2016-03-12: qty 10

## 2016-03-12 MED ORDER — DOPAMINE-DEXTROSE 1.6-5 MG/ML-% IV SOLN
INTRAVENOUS | Status: DC | PRN
  Administered 2016-03-12: 5 ug/kg/min via INTRAVENOUS

## 2016-03-12 MED ORDER — SUFENTANIL CITRATE 50 MCG/ML IV SOLN
INTRAVENOUS | Status: AC
  Filled 2016-03-12: qty 1

## 2016-03-12 MED ORDER — METOPROLOL TARTRATE 5 MG/5ML IV SOLN
2.5000 mg | INTRAVENOUS | Status: DC | PRN
Start: 2016-03-12 — End: 2016-03-19

## 2016-03-12 MED ORDER — DEXMEDETOMIDINE HCL IN NACL 200 MCG/50ML IV SOLN
0.0000 ug/kg/h | INTRAVENOUS | Status: DC
  Administered 2016-03-12: 0.1 ug/kg/h via INTRAVENOUS
  Filled 2016-03-12: qty 50

## 2016-03-12 MED ORDER — ALBUMIN HUMAN 5 % IV SOLN
250.0000 mL | INTRAVENOUS | Status: AC | PRN
  Administered 2016-03-12 (×3): 250 mL via INTRAVENOUS
  Filled 2016-03-12: qty 250

## 2016-03-12 MED ORDER — SODIUM CHLORIDE 0.9 % IV SOLN
INTRAVENOUS | Status: DC
  Administered 2016-03-12: 15:00:00 via INTRAVENOUS

## 2016-03-12 MED ORDER — NITROGLYCERIN IN D5W 200-5 MCG/ML-% IV SOLN: 0.0000 ug/min | INTRAVENOUS | Status: DC

## 2016-03-12 MED ORDER — ROCURONIUM BROMIDE 10 MG/ML (PF) SYRINGE
PREFILLED_SYRINGE | INTRAVENOUS | Status: AC
  Filled 2016-03-12: qty 10

## 2016-03-12 MED ORDER — SODIUM CHLORIDE 0.9 % IJ SOLN
INTRAMUSCULAR | Status: AC
  Filled 2016-03-12: qty 20

## 2016-03-12 MED ORDER — BISACODYL 10 MG RE SUPP
10.0000 mg | Freq: Every day | RECTAL | Status: DC
  Filled 2016-03-12: qty 1

## 2016-03-12 MED ORDER — CHLORHEXIDINE GLUCONATE 0.12 % MT SOLN
15.0000 mL | OROMUCOSAL | Status: AC
  Administered 2016-03-12: 15 mL via OROMUCOSAL

## 2016-03-12 MED ORDER — DOPAMINE-DEXTROSE 3.2-5 MG/ML-% IV SOLN: 0.0000 ug/kg/min | INTRAVENOUS | Status: DC

## 2016-03-12 MED ORDER — ASPIRIN 81 MG PO CHEW: 324.0000 mg | CHEWABLE_TABLET | Freq: Every day | ORAL | Status: DC

## 2016-03-12 MED ORDER — FAMOTIDINE IN NACL 20-0.9 MG/50ML-% IV SOLN
20.0000 mg | Freq: Two times a day (BID) | INTRAVENOUS | Status: DC
  Administered 2016-03-12: 20 mg via INTRAVENOUS

## 2016-03-12 MED ORDER — HEPARIN SODIUM (PORCINE) 1000 UNIT/ML IJ SOLN
INTRAMUSCULAR | Status: DC | PRN
  Administered 2016-03-12: 35000 [IU] via INTRAVENOUS

## 2016-03-12 MED ORDER — VANCOMYCIN HCL 1000 MG IV SOLR
INTRAVENOUS | Status: DC
  Filled 2016-03-12: qty 1000

## 2016-03-12 MED ORDER — PHENYLEPHRINE 40 MCG/ML (10ML) SYRINGE FOR IV PUSH (FOR BLOOD PRESSURE SUPPORT)
PREFILLED_SYRINGE | INTRAVENOUS | Status: AC
  Filled 2016-03-12: qty 10

## 2016-03-12 MED ORDER — SODIUM CHLORIDE 0.9% FLUSH
3.0000 mL | Freq: Two times a day (BID) | INTRAVENOUS | Status: DC
  Administered 2016-03-13 – 2016-03-19 (×10): 3 mL via INTRAVENOUS

## 2016-03-12 MED ORDER — LACTATED RINGERS IV SOLN: INTRAVENOUS | Status: DC

## 2016-03-12 MED ORDER — ACETAMINOPHEN 160 MG/5ML PO SOLN: 650.0000 mg | Freq: Once | ORAL | Status: AC

## 2016-03-12 MED ORDER — SODIUM CHLORIDE 0.9% FLUSH
3.0000 mL | INTRAVENOUS | Status: DC | PRN
Start: 2016-03-13 — End: 2016-03-19

## 2016-03-12 MED ORDER — MIDAZOLAM HCL 5 MG/5ML IJ SOLN
INTRAMUSCULAR | Status: DC | PRN
  Administered 2016-03-12: 2 mg via INTRAVENOUS
  Administered 2016-03-12: 5 mg via INTRAVENOUS
  Administered 2016-03-12: 1 mg via INTRAVENOUS
  Administered 2016-03-12 (×4): 3 mg via INTRAVENOUS

## 2016-03-12 MED ORDER — HEPARIN SODIUM (PORCINE) 1000 UNIT/ML IJ SOLN
INTRAMUSCULAR | Status: AC
  Filled 2016-03-12: qty 1

## 2016-03-12 MED ORDER — LIDOCAINE HCL (CARDIAC) 20 MG/ML IV SOLN
INTRAVENOUS | Status: DC | PRN
  Administered 2016-03-12: 100 mg via INTRAVENOUS

## 2016-03-12 MED ORDER — INSULIN REGULAR HUMAN 100 UNIT/ML IJ SOLN
INTRAMUSCULAR | Status: DC
  Filled 2016-03-12: qty 2.5

## 2016-03-12 MED ORDER — BISACODYL 5 MG PO TBEC
10.0000 mg | DELAYED_RELEASE_TABLET | Freq: Every day | ORAL | Status: DC
  Administered 2016-03-13 – 2016-03-19 (×7): 10 mg via ORAL
  Filled 2016-03-12 (×7): qty 2

## 2016-03-12 MED ORDER — BUPIVACAINE 0.5 % ON-Q PUMP SINGLE CATH 400 ML
400.0000 mL | INJECTION | Status: DC
  Filled 2016-03-12: qty 400

## 2016-03-12 MED ORDER — SUFENTANIL CITRATE 50 MCG/ML IV SOLN
INTRAVENOUS | Status: DC | PRN
  Administered 2016-03-12 (×2): 10 ug via INTRAVENOUS
  Administered 2016-03-12: 50 ug via INTRAVENOUS
  Administered 2016-03-12: 10 ug via INTRAVENOUS
  Administered 2016-03-12: 20 ug via INTRAVENOUS
  Administered 2016-03-12 (×2): 10 ug via INTRAVENOUS
  Administered 2016-03-12: 50 ug via INTRAVENOUS

## 2016-03-12 MED ORDER — PROTAMINE SULFATE 10 MG/ML IV SOLN
INTRAVENOUS | Status: AC
  Filled 2016-03-12: qty 10

## 2016-03-12 MED ORDER — OXYCODONE HCL 5 MG PO TABS
5.0000 mg | ORAL_TABLET | ORAL | Status: DC | PRN
  Administered 2016-03-13 – 2016-03-17 (×15): 10 mg via ORAL
  Administered 2016-03-18: 5 mg via ORAL
  Administered 2016-03-18 – 2016-03-19 (×3): 10 mg via ORAL
  Filled 2016-03-12 (×15): qty 2
  Filled 2016-03-12: qty 1
  Filled 2016-03-12 (×4): qty 2

## 2016-03-12 MED ORDER — POTASSIUM CHLORIDE 10 MEQ/50ML IV SOLN
10.0000 meq | Freq: Once | INTRAVENOUS | Status: AC
  Administered 2016-03-12: 10 meq via INTRAVENOUS

## 2016-03-12 MED ORDER — SODIUM CHLORIDE 0.9 % IV SOLN: INTRAVENOUS | Status: DC

## 2016-03-12 MED ORDER — LIDOCAINE 2% (20 MG/ML) 5 ML SYRINGE
INTRAMUSCULAR | Status: AC
  Filled 2016-03-12: qty 5

## 2016-03-12 MED ORDER — ASPIRIN EC 325 MG PO TBEC
325.0000 mg | DELAYED_RELEASE_TABLET | Freq: Every day | ORAL | Status: DC
  Administered 2016-03-13 – 2016-03-19 (×7): 325 mg via ORAL
  Filled 2016-03-12 (×7): qty 1

## 2016-03-12 MED ORDER — SODIUM CHLORIDE 0.9 % IV SOLN: 250.0000 mL | INTRAVENOUS | Status: DC

## 2016-03-12 MED ORDER — DOCUSATE SODIUM 100 MG PO CAPS
200.0000 mg | ORAL_CAPSULE | Freq: Every day | ORAL | Status: DC
  Administered 2016-03-13 – 2016-03-19 (×7): 200 mg via ORAL
  Filled 2016-03-12 (×7): qty 2

## 2016-03-12 MED ORDER — PHENYLEPHRINE HCL 10 MG/ML IJ SOLN
0.0000 ug/min | INTRAVENOUS | Status: DC
  Administered 2016-03-12 – 2016-03-13 (×2): 60 ug/min via INTRAVENOUS
  Filled 2016-03-12 (×3): qty 2

## 2016-03-12 MED ORDER — PROTAMINE SULFATE 10 MG/ML IV SOLN
INTRAVENOUS | Status: AC
  Filled 2016-03-12: qty 5

## 2016-03-12 MED ORDER — VANCOMYCIN HCL IN DEXTROSE 1-5 GM/200ML-% IV SOLN
1000.0000 mg | Freq: Once | INTRAVENOUS | Status: AC
  Administered 2016-03-12: 1000 mg via INTRAVENOUS
  Filled 2016-03-12: qty 200

## 2016-03-12 MED ORDER — ROCURONIUM BROMIDE 100 MG/10ML IV SOLN: INTRAVENOUS | Status: DC | PRN

## 2016-03-12 MED ORDER — MAGNESIUM SULFATE 4 GM/100ML IV SOLN
4.0000 g | Freq: Once | INTRAVENOUS | Status: AC
  Administered 2016-03-12: 4 g via INTRAVENOUS
  Filled 2016-03-12: qty 100

## 2016-03-12 MED ORDER — TRAMADOL HCL 50 MG PO TABS
50.0000 mg | ORAL_TABLET | ORAL | Status: DC | PRN
  Administered 2016-03-13 – 2016-03-18 (×2): 100 mg via ORAL
  Filled 2016-03-12 (×2): qty 2

## 2016-03-12 MED ORDER — ONDANSETRON HCL 4 MG/2ML IJ SOLN: 4.0000 mg | Freq: Four times a day (QID) | INTRAMUSCULAR | Status: DC | PRN

## 2016-03-12 MED ORDER — EPHEDRINE 5 MG/ML INJ
INTRAVENOUS | Status: AC
  Filled 2016-03-12: qty 10

## 2016-03-12 MED ORDER — MORPHINE SULFATE (PF) 2 MG/ML IV SOLN
1.0000 mg | INTRAVENOUS | Status: DC | PRN
  Administered 2016-03-12 – 2016-03-15 (×12): 2 mg via INTRAVENOUS
  Filled 2016-03-12 (×12): qty 1

## 2016-03-12 MED ORDER — LEVOTHYROXINE SODIUM 100 MCG PO TABS
200.0000 ug | ORAL_TABLET | Freq: Every day | ORAL | Status: DC
  Administered 2016-03-13 – 2016-03-19 (×7): 200 ug via ORAL
  Filled 2016-03-12 (×3): qty 2
  Filled 2016-03-12: qty 1
  Filled 2016-03-12: qty 2
  Filled 2016-03-12 (×2): qty 1
  Filled 2016-03-12: qty 2
  Filled 2016-03-12: qty 1
  Filled 2016-03-12 (×2): qty 2

## 2016-03-12 MED ORDER — VANCOMYCIN HCL 1000 MG IV SOLR
INTRAVENOUS | Status: DC | PRN
  Administered 2016-03-12: 1000 mL

## 2016-03-12 MED ORDER — DEXTROSE 5 % IV SOLN
1.5000 g | Freq: Two times a day (BID) | INTRAVENOUS | Status: AC
  Administered 2016-03-12 – 2016-03-14 (×4): 1.5 g via INTRAVENOUS
  Filled 2016-03-12 (×4): qty 1.5

## 2016-03-12 MED ORDER — LACTATED RINGERS IV SOLN
500.0000 mL | Freq: Once | INTRAVENOUS | Status: AC | PRN
  Administered 2016-03-12: 500 mL via INTRAVENOUS

## 2016-03-12 MED ORDER — 0.9 % SODIUM CHLORIDE (POUR BTL) OPTIME
TOPICAL | Status: DC | PRN
  Administered 2016-03-12: 6000 mL

## 2016-03-12 MED ORDER — ARTIFICIAL TEARS OP OINT
TOPICAL_OINTMENT | OPHTHALMIC | Status: AC
  Filled 2016-03-12: qty 3.5

## 2016-03-12 MED ORDER — METOPROLOL TARTRATE 25 MG/10 ML ORAL SUSPENSION: 12.5000 mg | Freq: Two times a day (BID) | ORAL | Status: DC

## 2016-03-12 MED ORDER — ACETAMINOPHEN 160 MG/5ML PO SOLN: 1000.0000 mg | Freq: Four times a day (QID) | ORAL | Status: DC

## 2016-03-12 MED ORDER — ROCURONIUM BROMIDE 10 MG/ML (PF) SYRINGE
PREFILLED_SYRINGE | INTRAVENOUS | Status: AC
  Filled 2016-03-12: qty 20

## 2016-03-12 MED ORDER — PROTAMINE SULFATE 10 MG/ML IV SOLN
INTRAVENOUS | Status: AC
  Filled 2016-03-12: qty 25

## 2016-03-12 MED ORDER — INSULIN REGULAR BOLUS VIA INFUSION
0.0000 [IU] | Freq: Three times a day (TID) | INTRAVENOUS | Status: DC
  Filled 2016-03-12: qty 10

## 2016-03-12 MED ORDER — METOPROLOL TARTRATE 12.5 MG HALF TABLET: 12.5000 mg | ORAL_TABLET | Freq: Two times a day (BID) | ORAL | Status: DC

## 2016-03-12 MED ORDER — MIDAZOLAM HCL 2 MG/2ML IJ SOLN: 2.0000 mg | INTRAMUSCULAR | Status: DC | PRN

## 2016-03-12 MED ORDER — PROPOFOL 10 MG/ML IV BOLUS
INTRAVENOUS | Status: DC | PRN
  Administered 2016-03-12: 100 mg via INTRAVENOUS

## 2016-03-12 MED ORDER — PANTOPRAZOLE SODIUM 40 MG PO TBEC
40.0000 mg | DELAYED_RELEASE_TABLET | Freq: Every day | ORAL | Status: DC
  Administered 2016-03-14 – 2016-03-17 (×4): 40 mg via ORAL
  Filled 2016-03-12 (×4): qty 1

## 2016-03-12 MED ORDER — ACETAMINOPHEN 650 MG RE SUPP
650.0000 mg | Freq: Once | RECTAL | Status: AC
  Administered 2016-03-12: 650 mg via RECTAL

## 2016-03-12 MED ORDER — SODIUM CHLORIDE 0.45 % IV SOLN: INTRAVENOUS | Status: DC | PRN

## 2016-03-12 MED FILL — Heparin Sodium (Porcine) Inj 1000 Unit/ML: INTRAMUSCULAR | Qty: 30 | Status: AC

## 2016-03-12 MED FILL — Potassium Chloride Inj 2 mEq/ML: INTRAVENOUS | Qty: 40 | Status: AC

## 2016-03-12 MED FILL — Magnesium Sulfate Inj 50%: INTRAMUSCULAR | Qty: 10 | Status: AC

## 2016-03-12 MED FILL — Cefuroxime Sodium For Inj 750 MG: INTRAMUSCULAR | Qty: 750 | Status: AC

## 2016-03-12 SURGICAL SUPPLY — 102 items
ADAPTER CARDIO PERF ANTE/RETRO (ADAPTER) ×5 IMPLANT
ADPR PRFSN 84XANTGRD RTRGD (ADAPTER) ×1
APPLIER CLIP 11 MED OPEN (CLIP) ×5
APR CLP MED 11 20 MLT OPN (CLIP) ×2
ARTICLIP LAA PROCLIP II 45 (Clip) ×5 IMPLANT
BAG DECANTER FOR FLEXI CONT (MISCELLANEOUS) ×10 IMPLANT
BLADE SURG 11 STRL SS (BLADE) ×5 IMPLANT
CANISTER SUCTION 2500CC (MISCELLANEOUS) ×5 IMPLANT
CANNULA FEM VENOUS REMOTE 22FR (CANNULA) ×5 IMPLANT
CANNULA FEMORAL ART 14 SM (MISCELLANEOUS) ×5 IMPLANT
CANNULA GUNDRY RCSP 15FR (MISCELLANEOUS) ×5 IMPLANT
CANNULA OPTISITE PERFUSION 16F (CANNULA) IMPLANT
CANNULA OPTISITE PERFUSION 18F (CANNULA) ×5 IMPLANT
CANNULA SUMP PERICARDIAL (CANNULA) ×10 IMPLANT
CARDIOBLATE CARDIAC ABLATION (MISCELLANEOUS) ×5
CATH KIT ON-Q SILVERSOAKER 10 (CATHETERS) ×5 IMPLANT
CATH ROBINSON RED A/P 18FR (CATHETERS) ×5 IMPLANT
CELLS DAT CNTRL 66122 CELL SVR (MISCELLANEOUS) ×4 IMPLANT
CLIP APPLIE 11 MED OPEN (CLIP) ×4 IMPLANT
CONN ST 1/4X3/8  BEN (MISCELLANEOUS) ×2
CONN ST 1/4X3/8 BEN (MISCELLANEOUS) ×8 IMPLANT
CONNECTOR 1/2X3/8X1/2 3 WAY (MISCELLANEOUS) ×1
CONNECTOR 1/2X3/8X1/2 3WAY (MISCELLANEOUS) ×4 IMPLANT
COVER BACK TABLE 24X17X13 BIG (DRAPES) ×5 IMPLANT
CRADLE DONUT ADULT HEAD (MISCELLANEOUS) ×5 IMPLANT
DERMABOND ADVANCED (GAUZE/BANDAGES/DRESSINGS) ×2
DERMABOND ADVANCED .7 DNX12 (GAUZE/BANDAGES/DRESSINGS) ×8 IMPLANT
DEVICE ATRICLIP LAA PRCLPII 45 (Clip) ×4 IMPLANT
DEVICE CARDIOBLATE CARDIAC ABL (MISCELLANEOUS) ×4 IMPLANT
DEVICE TROCAR PUNCTURE CLOSURE (ENDOMECHANICALS) ×5 IMPLANT
DRAIN CHANNEL 28F RND 3/8 FF (WOUND CARE) ×10 IMPLANT
DRAPE BILATERAL SPLIT (DRAPES) ×5 IMPLANT
DRAPE C-ARM 42X72 X-RAY (DRAPES) ×5 IMPLANT
DRAPE CV SPLIT W-CLR ANES SCRN (DRAPES) ×5 IMPLANT
DRAPE INCISE IOBAN 66X45 STRL (DRAPES) ×20 IMPLANT
DRAPE SLUSH/WARMER DISC (DRAPES) ×5 IMPLANT
DRSG COVADERM 4X8 (GAUZE/BANDAGES/DRESSINGS) ×5 IMPLANT
ELECT BLADE 6.5 EXT (BLADE) ×5 IMPLANT
ELECT REM PT RETURN 9FT ADLT (ELECTROSURGICAL) ×10
ELECTRODE REM PT RTRN 9FT ADLT (ELECTROSURGICAL) ×8 IMPLANT
FEMORAL VENOUS CANN RAP (CANNULA) IMPLANT
GAUZE SPONGE 4X4 12PLY STRL (GAUZE/BANDAGES/DRESSINGS) ×5 IMPLANT
GLOVE ORTHO TXT STRL SZ7.5 (GLOVE) ×15 IMPLANT
GOWN STRL REUS W/ TWL LRG LVL3 (GOWN DISPOSABLE) ×28 IMPLANT
GOWN STRL REUS W/TWL LRG LVL3 (GOWN DISPOSABLE) ×7
KIT BASIN OR (CUSTOM PROCEDURE TRAY) ×5 IMPLANT
KIT DILATOR VASC 18G NDL (KITS) ×5 IMPLANT
KIT DRAINAGE VACCUM ASSIST (KITS) ×5 IMPLANT
KIT ROOM TURNOVER OR (KITS) ×5 IMPLANT
KIT SUCTION CATH 14FR (SUCTIONS) ×5 IMPLANT
LEAD PACING MYOCARDI (MISCELLANEOUS) ×5 IMPLANT
LINE VENT (MISCELLANEOUS) ×5 IMPLANT
LOOP VESSEL SUPERMAXI WHITE (MISCELLANEOUS) ×5 IMPLANT
NEEDLE AORTIC ROOT 14G 7F (CATHETERS) ×5 IMPLANT
NS IRRIG 1000ML POUR BTL (IV SOLUTION) ×30 IMPLANT
PACK OPEN HEART (CUSTOM PROCEDURE TRAY) ×5 IMPLANT
PAD ARMBOARD 7.5X6 YLW CONV (MISCELLANEOUS) ×10 IMPLANT
PAD ELECT DEFIB RADIOL ZOLL (MISCELLANEOUS) ×5 IMPLANT
PATCH CORMATRIX 4CMX7CM (Prosthesis & Implant Heart) ×5 IMPLANT
PROBE CRYO2-ABLATION MALLABLE (MISCELLANEOUS) ×5 IMPLANT
RETRACTOR TRL SOFT TISSUE LG (INSTRUMENTS) IMPLANT
RETRACTOR TRM SOFT TISSUE 7.5 (INSTRUMENTS) IMPLANT
RTRCTR WOUND ALEXIS 18CM MED (MISCELLANEOUS) ×5
SET CANNULATION TOURNIQUET (MISCELLANEOUS) ×5 IMPLANT
SET CARDIOPLEGIA MPS 5001102 (MISCELLANEOUS) ×5 IMPLANT
SET IRRIG TUBING LAPAROSCOPIC (IRRIGATION / IRRIGATOR) ×5 IMPLANT
SOLUTION ANTI FOG 6CC (MISCELLANEOUS) ×5 IMPLANT
SPONGE GAUZE 4X4 12PLY STER LF (GAUZE/BANDAGES/DRESSINGS) ×10 IMPLANT
SUT BONE WAX W31G (SUTURE) ×5 IMPLANT
SUT E-PACK MINIMALLY INVASIVE (SUTURE) ×5 IMPLANT
SUT ETHIBOND (SUTURE) IMPLANT
SUT ETHIBOND 2 0 SH (SUTURE) IMPLANT
SUT ETHIBOND 2 0 V4 (SUTURE) IMPLANT
SUT ETHIBOND 2 0V4 GREEN (SUTURE) IMPLANT
SUT ETHIBOND 2-0 RB-1 WHT (SUTURE) IMPLANT
SUT ETHIBOND 4 0 TF (SUTURE) IMPLANT
SUT ETHIBOND 5 0 C 1 30 (SUTURE) IMPLANT
SUT ETHIBOND NAB MH 2-0 36IN (SUTURE) IMPLANT
SUT ETHIBOND X763 2 0 SH 1 (SUTURE) ×5 IMPLANT
SUT GORETEX 6.0 TH-9 30 IN (SUTURE) IMPLANT
SUT GORETEX CV 4 TH 22 36 (SUTURE) ×5 IMPLANT
SUT GORETEX CV-5THC-13 36IN (SUTURE) IMPLANT
SUT GORETEX CV4 TH-18 (SUTURE) ×10 IMPLANT
SUT GORETEX TH-18 36 INCH (SUTURE) IMPLANT
SUT PROLENE 3 0 SH1 36 (SUTURE) ×30 IMPLANT
SUT PROLENE 4 0 RB 1 (SUTURE) ×12
SUT PROLENE 4-0 RB1 .5 CRCL 36 (SUTURE) ×24 IMPLANT
SUT VIC AB 3-0 SH 8-18 (SUTURE) ×5 IMPLANT
SYRINGE 10CC LL (SYRINGE) IMPLANT
SYSTEM SAHARA CHEST DRAIN ATS (WOUND CARE) ×5 IMPLANT
TAPE CLOTH SURG 4X10 WHT LF (GAUZE/BANDAGES/DRESSINGS) ×10 IMPLANT
TOWEL OR 17X24 6PK STRL BLUE (TOWEL DISPOSABLE) ×10 IMPLANT
TOWEL OR 17X26 10 PK STRL BLUE (TOWEL DISPOSABLE) ×10 IMPLANT
TRAY FOLEY IC TEMP SENS 16FR (CATHETERS) ×5 IMPLANT
TROCAR XCEL BLADELESS 5X75MML (TROCAR) ×5 IMPLANT
TROCAR XCEL NON-BLD 11X100MML (ENDOMECHANICALS) ×10 IMPLANT
TUBE SUCT INTRACARD DLP 20F (MISCELLANEOUS) ×5 IMPLANT
TUNNELER SHEATH ON-Q 11GX8 DSP (PAIN MANAGEMENT) ×5 IMPLANT
UNDERPAD 30X30 (UNDERPADS AND DIAPERS) ×5 IMPLANT
WATER STERILE IRR 1000ML POUR (IV SOLUTION) ×10 IMPLANT
WIRE .035 3MM-J 145CM (WIRE) ×5 IMPLANT
WIRE J 3MM .035X145CM (WIRE) IMPLANT

## 2016-03-12 NOTE — Brief Op Note (Addendum)
03/12/2016  12:11 PM  PATIENT:  Colin Wood  55 y.o. male  PRE-OPERATIVE DIAGNOSIS:  CHRONIC, PERSISTENT AFIB  POST-OPERATIVE DIAGNOSIS:  1. CHRONIC, PERSISTENT AFIB 2. TWO PFO's  PROCEDURE: TRANSESOPHAGEAL ECHOCARDIOGRAM (TEE), COMPLETE MINIMALLY INVASIVE MAZE PROCEDURE USING BIPOLAR RADIOFREQUENCY and CRYOTHERMY ABLATION, CLIPPING of LEFT ATRIAL APPENDAGE, and PRIMARY CLOSURE of PATENT FORAMEN OVALE  TIMES 2  SURGEON:    [name], MD  ASSISTANTS:  Nani Skillern, PA-C  ANESTHESIA:   Lillia Abed, MD  CROSSCLAMP TIME:   66'  CARDIOPULMONARY BYPASS TIME: 136'  FINDINGS:  Mild global LV systolic dysfuction  Relatively thick walled, dilated left atrium  Patent foramen ovale  Maze Procedure  Surgical Approach: Right mini thoracotomy  Cut-and-sew:  No.  Cryo: Yes  Cryo Lesions (select all that apply):        2   Box Lesion,     4  Posterior Mirtal Annular Line,     6  Mitral Valve Cryo Lesion,     9   Intercaval Line to Tricuspid Annulus ("T" Lesion) and    10  Tricuspid Cryo Lesion, Medial   16  Other - epicardial posterior AV groove and coronary sinus and posterior right atrial flutter lesion    Radiofrequency:  Yes.  Bipolar: Yes.  RF Lesions (select all that apply):     9   Intercaval Line to Tricuspid Annulus ("T" Lesion),    11  Intercaval Line and   15b  RAA Lateral Wall to "T" Lesion     Left Atrial Appendage Treatment:    Yes -  epicardial clip    COMPLICATIONS: None  BASELINE WEIGHT: 115 kg  PATIENT DISPOSITION:   TO SICU IN STABLE CONDITION  [name], MD 03/12/2016 1:42 PM

## 2016-03-12 NOTE — Interval H&P Note (Signed)
History and Physical Interval Note:  03/12/2016 5:29 AM  Colin Wood  has presented today for surgery, with the diagnosis of AFIB  The various methods of treatment have been discussed with the patient and family. After consideration of risks, benefits and other options for treatment, the patient has consented to  Procedure(s): MINIMALLY INVASIVE MAZE PROCEDURE (N/A) TRANSESOPHAGEAL ECHOCARDIOGRAM (TEE) (N/A) as a surgical intervention .  The patient's history has been reviewed, patient examined, no change in status, stable for surgery.  I have reviewed the patient's chart and labs.  Questions were answered to the patient's satisfaction.     [name]

## 2016-03-12 NOTE — Anesthesia Procedure Notes (Signed)
Procedure Name: Intubation Date/Time: 03/12/2016 8:13 AM Performed by: Izora Gala Pre-anesthesia Checklist: Patient identified, Emergency Drugs available, Suction available and Patient being monitored Patient Re-evaluated:Patient Re-evaluated prior to inductionOxygen Delivery Method: Circle system utilized Preoxygenation: Pre-oxygenation with 100% oxygen Intubation Type: IV induction Ventilation: Mask ventilation without difficulty Laryngoscope Size: Miller and 3 Grade View: Grade I Tube type: Oral Endobronchial tube: Left and 41 Fr Number of attempts: 1 Airway Equipment and Method: Stylet Placement Confirmation: ETT inserted through vocal cords under direct vision,  positive ETCO2 and breath sounds checked- equal and bilateral Secured at: 31 cm Tube secured with: Tape Dental Injury: Teeth and Oropharynx as per pre-operative assessment

## 2016-03-12 NOTE — Anesthesia Postprocedure Evaluation (Signed)
Anesthesia Post Note  Patient: Colin Wood  Procedure(s) Performed: Procedure(s) (LRB): MINIMALLY INVASIVE MAZE PROCEDURE (N/A) TRANSESOPHAGEAL ECHOCARDIOGRAM (TEE) (N/A) CLIPPING OF ATRIAL APPENDAGE (Left) PATENT FORAMEN OVALE CLOSURE TIMES 2  Patient location during evaluation: SICU Anesthesia Type: General Level of consciousness: sedated Pain management: pain level controlled Vital Signs Assessment: post-procedure vital signs reviewed and stable Respiratory status: patient remains intubated per anesthesia plan Cardiovascular status: stable Anesthetic complications: no    Last Vitals:  Vitals:   03/12/16 1600 03/12/16 1649  BP: 112/76 112/76  Pulse: 89 90  Resp: 17 18  Temp: 37.9 C     Last Pain:  Vitals:   03/12/16 1500  TempSrc: Core (Comment)                 Kemond Amorin DAVID

## 2016-03-12 NOTE — Procedures (Signed)
Extubation Procedure Note  Patient Details:   Name: Colin Wood DOB: 04/17/1961 MRN: [id number]   Airway Documentation:     Evaluation  O2 sats: stable throughout Complications: No apparent complications Patient did tolerate procedure well. Bilateral Breath Sounds: Clear, Diminished   Yes   Positive cuff leak noted, NIF - 30, VC 1.06L Post extubation pt placed on nasal cannula 4 L pm with humidity, no stridor noted.  Bayard Beaver 03/12/2016, 6:36 PM

## 2016-03-12 NOTE — Op Note (Signed)
CARDIOTHORACIC SURGERY OPERATIVE NOTE  Date of Procedure:   03/12/2016  Preoperative Diagnosis:    Long-standing Persistent Atrial Fibrillation  Postoperative Diagnosis:    Long-standing Persistent Atrial Fibrillation  Patent Foramen Ovale  Procedure:    Minimally-Invasive Maze Procedure  Complete bilateral atrial lesion set using cryothermy and bipolar radiofrequency ablation  Clipping of Left Atrial Appendage (Atriclip Pro245 left atrial clip, size 45 mm)   Closure of Patent Foramen Ovale   Surgeon: [name]. Roxy Manns, MD  Assistant: Nani Skillern, PA-C  Anesthesia: Lillia Abed, MD  Operative Findings:  Mild global LV systolic dysfuction  Relatively thick walled, dilated left atrium  Patent foramen ovale                     BRIEF CLINICAL NOTE AND INDICATIONS FOR SURGERY  Patient is a 55 year old obese white male with history of chronic persistent atrial fibrillation that has failed both medical therapy and catheter-based ablation who has been referred for surgical consultation to discuss further treatment options for management of atrial fibrillation. The patient's history of atrial fibrillation dates back to July 2012 when he was found to be in atrial fibrillation on routine follow-up EKG performed by his primary care physician. He was referred for cardiology consultation and initially evaluated by [name]. An echocardiogram performed at that time revealed normal left ventricular systolic function. The patient was suspected to have obstructive sleep apnea and referred for sleep consult which confirmed the presence of obstructive sleep apnea. He has been treated using CPAP ever since. He was eventually referred to [name] who has been following him for several years for atrial fibrillation. He has failed medical therapy on Tikosyn and has been cardioverted on 2 occasions, neither of which worked. He underwent catheter-based  Afibablation in 2014 and did well for approximately 6 months until atrial fibrillation recurred. He was seen in follow-up recently by [name] and echocardiogram revealed further enlargement of the left atrium with what appeared to be significant drop in left ventricular ejection fraction. Treatment options were discussed and the patient has subsequently been referred for surgical consultation to consider possible Maze procedure.The patient has been seen in consultation and counseled at length regarding the indications, risks and potential benefits of surgery.  All questions have been answered, and the patient provides full informed consent for the operation as described.    DETAILS OF THE OPERATIVE PROCEDURE  Preparation:  The patient is brought to the operating room on the above mentioned date and central monitoring was established by the anesthesia team including placement of central venous catheter through the left internal jugular vein.  A radial arterial line is placed. The patient is placed in the supine position on the operating table.  Intravenous antibiotics are administered. General endotracheal anesthesia is induced uneventfully. The patient is initially intubated using a dual lumen endotracheal tube.  A Foley catheter is placed.  Baseline transesophageal echocardiogram was performed.  Findings were notable for mild global left ventricular systolic dysfunction. Imaging of the left ventricle is suboptimal but there are no obvious wall motion abnormalities. There is moderate to severe left atrial enlargement. There is no clot in the left atrial appendage. There is trace central mitral regurgitation. There is a patent foramen ovale. The aortic valve appears normal. There is mild tricuspid regurgitation. The right atrium is dilated. Right ventricular size and function is normal.  A soft roll is placed behind the patient's left scapula and the neck gently extended and turned to  the left.   The  patient's right neck, chest, abdomen, both groins, and both lower extremities are prepared and draped in a sterile manner. A time out procedure is performed.   Surgical Approach:  A right miniature anterolateral thoracotomy incision is performed. The incision is placed just lateral to and superior to the right nipple. The pectoralis major muscle is retracted medially and completely preserved. The right pleural space is entered through the 3rd intercostal space. A soft tissue retractor is placed.  Two 11 mm ports are placed through separate stab incisions inferiorly. The right pleural space is insufflated continuously with carbon dioxide gas through the posterior port during the remainder of the operation.  A pledgeted sutures placed through the dome of the right hemidiaphragm and retracted inferiorly to facilitate exposure.  A longitudinal incision is made in the pericardium 3 cm anterior to the phrenic nerve and silk traction sutures are placed on either side of the incision for exposure.   Extracorporeal Cardiopulmonary Bypass:  A small incision is made in the right inguinal crease and the anterior surface of the right common femoral artery and right common femoral vein are identified.  The patient is placed in Trendelenburg position. The right internal jugular vein is cannulated with Seldinger technique and a guidewire advanced into the right atrium. The patient is heparinized systemically. The right internal jugular vein is cannulated with a 14 [place] pediatric femoral venous cannula. Pursestring sutures are placed on the anterior surface of the right common femoral vein and right common femoral artery. The right common femoral vein is cannulated with the Seldinger technique and a guidewire is advanced under transesophageal echocardiogram guidance through the right atrium. The femoral vein is cannulated with a long 22 French femoral venous cannula. The right common femoral artery is cannulated with  Seldinger technique and a flexible guidewire is advanced until it can be appreciated intraluminally in the descending thoracic aorta on transesophageal echocardiogram. The femoral artery is cannulated with an 18 French femoral arterial cannula.  Adequate heparinization is verified.     The entire pre-bypass portion of the operation was notable for stable hemodynamics.  Cardiopulmonary bypass was begun.  Vacuum assist venous drainage is utilized. The incision in the pericardium is extended in both directions. Venous drainage and exposure are notably excellent.    Clipping of Left Atrial Appendage:  The left atrial appendage is obliterated using an Atricure left atrial appendage clip (Atriclip, size 64mm).  The clip is applied under thoracoscopic visualization posterior to the aorta and pulmonary artery through the oblique sinus.  Transesophageal echocardiography was utilized to confirm that the clip satisfactorily obliterates the appendage.   Myocardial Protection:  An antegrade cardioplegia cannula is placed in the ascending aorta.  The patient is cooled to 28C systemic temperature.  The aortic cross clamp is applied and cold blood cardioplegia is delivered in an antegrade fashion through the aortic root.  The initial cardioplegic arrest is rapid with early diastolic arrest.  Repeat doses of cardioplegia are administered intermittently every 20 to 30 minutes throughout the entire cross clamp portion of the operation through the aortic root in order to maintain completely flat electrocardiogram.  Myocardial protection was felt to be excellent.   Closure of Patent Foramen Ovale:  Following placement of the aortic crossclamp and the administration of the initial arresting dose of cardioplegia, a left atriotomy incision was performed through the interatrial groove and extended partially across the back wall of the left atrium after opening the oblique sinus inferiorly.  Upon retraction of the  intra-atrial septum one can easily appreciate to distinctly separate holes in the fossa ovalis, one on each side, and each with overlying flaps consistent with patent foramen ovale. These did not have the appearance of iatrogenic perforations secondary to transseptal puncture during catheter-based ablation. Rather, these appeared consistent with typical patent foramen ovale. Each hole was closed individually using a 2 layer closure of running 4-0 Prolene suture.   Maze Procedure (left atrial lesion set):  The mitral valve and floor of the left atrium are exposed using a self-retaining retractor.  The AtriCure Synergy bipolar radiofrequency ablation clamp is utilized to create a series of linear lesions.  Initially, the right pulmonary veins are encircled with an umbilical tape and an elliptical lesion is placed around the base of the right-sided pulmonary veins using the bipolar radiofrequency ablation clamp. Next a linear lesion is placed across the dome of the left atrium.  After this a similar parallel lesion is placed across the back wall of the left atrium. Finally, one last bipolar lesion is placed extending from the inferior apex of the atriotomy incision towards the mitral valve. The Atricure CryoICE nitrous oxide cryothermy system is utilized for all cryothermy ablation lesions.  The left atrial lesion set of the Cox cryomaze procedure is now performed using 3 minute duration for all cryothermy lesions.  Initially a lesion is placed along the endocardial surface of the left atrium from the caudad apex of the atriotomy incision across the posterior wall of the left atrium onto the posterior mitral annulus.  A mirror image lesion along the epicardial surface is then performed with the probe posterior to the left atrium, crossing over the coronary sinus.  Another similar lesion is extended across the back wall of the right atrium to reach the lateral wall of the right atrium.  Two lesions are then  performed to create a box isolating all of the pulmonary veins from the remainder of the left atrium.  The first lesion is placed from the cephalad apex of the atriotomy incision across the dome of the left atrium to just anterior to the left sided pulmonary veins.  The second lesion completes the box from the caudad apex of the atriotomy incision across the back wall of the left atrium to connect with the previous lesion just anterior to the left sided pulmonary veins.    The atriotomy was closed using a 2-layer closure of running 3-0 Prolene suture after placing a sump drain across the mitral valve to serve as a left ventricular vent.  One final dose of warm retrograde "hot shot" cardioplegia was administered through the aortic root.  The aortic cross clamp was removed after a total cross clamp time of 69 minutes.   Maze Procedure (right atrial lesion set):  An oblique right atriotomy incision is performed.  The AtriCure Synergy bipolar radiofrequency ablation clamp is utilized to create a series of linear lesions in the right atrium, each with one limb of the clamp along the endocardial surface and the other along the epicardial surface. The first lesion is placed from the posterior apex of the atriotomy incision and along the lateral wall of the right atrium to reach the lateral aspect of the superior vena cava. A second lesion is placed in the opposite direction from the posterior apex of the atriotomy incision along the lateral wall to reach the lateral aspect of the inferior vena cava. A third lesion is placed from the midportion of the atriotomy incision  extending at a right angle to reach the tip of the right atrial appendage. A fourth lesion is placed from the anterior apex of the atriotomy incision in an anterior and inferior direction to reach the acute margin of the heart. Finally, the cryotherapy probe is utilized to complete the right atrial lesion set by placing the probe along the endocardial  surface of the right atrium from the anterior apex of the atriotomy incision to reach the tricuspid annulus at the 2:00 position. The atriotomy incision is closed with a 2 layer closure of running 4-0 Prolene suture.   Procedure Completion:  Epicardial pacing wires are fixed to the inferior wall of the right ventricule and to the right atrial appendage. The patient is rewarmed to 37C temperature. The left ventricular vent is removed.  The patient is ventilated and flow volumes turndown while the mitral valve repair is inspected using transesophageal echocardiogram. The valve repair appears intact with no residual leak. The antegrade cardioplegia cannula is now removed. The patient is weaned and disconnected from cardiopulmonary bypass.  The patient's rhythm at separation from bypass was junctional with atrial pacing.  The patient was weaned from bypass without any inotropic support. Total cardiopulmonary bypass time for the operation was 136 minutes.  Followup transesophageal echocardiogram performed after separation from bypass revealed no evidence for any residual communication across the intra-atrial septum.  Left ventricular function was unchanged from preoperatively.  There remained only trace central mitral regurgitation and mild central tricuspid regurgitation.  The femoral arterial and venous cannulae were removed uneventfully. There was a palpable pulse in the distal right common femoral artery after removal of the cannula. Protamine was administered to reverse the anticoagulation. The right internal jugular cannula was removed and manual pressure held on the neck for 15 minutes.  Single lung ventilation was begun. The atriotomy closure was inspected for hemostasis. The pericardial sac was drained using a 28 French Bard drain placed through the anterior port incision.  The pericardium was closed using a patch of core matrix bovine submucosal tissue patch. The right pleural space is irrigated with  saline solution and inspected for hemostasis. The right pleural space was drained using a 28 French Bard drain placed through the posterior port incision. The miniature thoracotomy incision was closed in multiple layers in routine fashion. The right groin incision was inspected for hemostasis and closed in multiple layers in routine fashion.  The post-bypass portion of the operation was notable for stable rhythm and hemodynamics.  No blood products were administered during the operation.   Disposition:  The patient tolerated the procedure well.  The patient was reintubated using a single lumen endotracheal tube and subsequently transported to the surgical intensive care unit in stable condition. There were no intraoperative complications. All sponge instrument and needle counts are verified correct at completion of the operation.    [name]. Roxy Manns MD 03/12/2016 1:50 PM

## 2016-03-12 NOTE — Anesthesia Preprocedure Evaluation (Signed)
Anesthesia Evaluation  Patient identified by MRN, date of birth, ID band Patient awake    Reviewed: Allergy & Precautions, NPO status , Patient's Chart, lab work & pertinent test results  Airway Mallampati: I  TM Distance: >3 FB Neck ROM: Full    Dental   Pulmonary sleep apnea ,    Pulmonary exam normal        Cardiovascular Normal cardiovascular exam+ dysrhythmias Atrial Fibrillation      Neuro/Psych Depression    GI/Hepatic GERD  Medicated and Controlled,  Endo/Other  diabetes  Renal/GU      Musculoskeletal   Abdominal   Peds  Hematology   Anesthesia Other Findings   Reproductive/Obstetrics                             Anesthesia Physical Anesthesia Plan  ASA: III  Anesthesia Plan: General   Post-op Pain Management:    Induction: Intravenous  Airway Management Planned: Double Lumen EBT  Additional Equipment: Arterial line, CVP, PA Cath, TEE and Ultrasound Guidance Line Placement  Intra-op Plan:   Post-operative Plan: Post-operative intubation/ventilation  Informed Consent: I have reviewed the patients History and Physical, chart, labs and discussed the procedure including the risks, benefits and alternatives for the proposed anesthesia with the patient or authorized representative who has indicated his/her understanding and acceptance.     Plan Discussed with: CRNA and Surgeon  Anesthesia Plan Comments:         Anesthesia Quick Evaluation

## 2016-03-12 NOTE — Transfer of Care (Signed)
Immediate Anesthesia Transfer of Care Note  Patient: Colin Wood  Procedure(s) Performed: Procedure(s): MINIMALLY INVASIVE MAZE PROCEDURE (N/A) TRANSESOPHAGEAL ECHOCARDIOGRAM (TEE) (N/A) CLIPPING OF ATRIAL APPENDAGE (Left) PATENT FORAMEN OVALE CLOSURE TIMES 2  Patient Location: SICU  Anesthesia Type:General  Level of Consciousness: Patient remains intubated per anesthesia plan  Airway & Oxygen Therapy: Patient remains intubated per anesthesia plan  Post-op Assessment: Report given to RN and Post -op Vital signs reviewed and stable  Post vital signs: Reviewed and stable  Last Vitals:  Vitals:   03/12/16 0612 03/12/16 1425  BP: 118/67 121/73  Pulse: 82 80  Resp: 18 19  Temp: 36.7 C     Last Pain:  Vitals:   03/12/16 0612  TempSrc: Oral         Complications: No apparent anesthesia complications

## 2016-03-12 NOTE — Progress Notes (Signed)
  Echocardiogram Echocardiogram Transesophageal has been performed.  Colin Wood M 03/12/2016, 8:35 AM

## 2016-03-13 ENCOUNTER — Inpatient Hospital Stay (HOSPITAL_COMMUNITY): Payer: 59

## 2016-03-13 LAB — POCT I-STAT, CHEM 8
BUN: 17 mg/dL (ref 6–20)
CHLORIDE: 100 mmol/L — AB (ref 101–111)
Calcium, Ion: 1.15 mmol/L (ref 1.15–1.40)
Creatinine, Ser: 1 mg/dL (ref 0.61–1.24)
Glucose, Bld: 248 mg/dL — ABNORMAL HIGH (ref 65–99)
HCT: 41 % (ref 39.0–52.0)
Hemoglobin: 13.9 g/dL (ref 13.0–17.0)
POTASSIUM: 3.9 mmol/L (ref 3.5–5.1)
SODIUM: 135 mmol/L (ref 135–145)
TCO2: 20 mmol/L (ref 0–100)

## 2016-03-13 LAB — CBC
HEMATOCRIT: 38.3 % — AB (ref 39.0–52.0)
HEMATOCRIT: 38.8 % — AB (ref 39.0–52.0)
HEMOGLOBIN: 13.3 g/dL (ref 13.0–17.0)
HEMOGLOBIN: 13.8 g/dL (ref 13.0–17.0)
MCH: 28.9 pg (ref 26.0–34.0)
MCH: 29.5 pg (ref 26.0–34.0)
MCHC: 34.7 g/dL (ref 30.0–36.0)
MCHC: 35.6 g/dL (ref 30.0–36.0)
MCV: 83.1 fL (ref 78.0–100.0)
MCV: 82.9 fL (ref 78.0–100.0)
Platelets: 182 10*3/uL (ref 150–400)
Platelets: 167 10*3/uL (ref 150–400)
RBC: 4.61 MIL/uL (ref 4.22–5.81)
RBC: 4.68 MIL/uL (ref 4.22–5.81)
RDW: 13.4 % (ref 11.5–15.5)
RDW: 13.5 % (ref 11.5–15.5)
WBC: 12.9 10*3/uL — ABNORMAL HIGH (ref 4.0–10.5)
WBC: 14.5 10*3/uL — ABNORMAL HIGH (ref 4.0–10.5)

## 2016-03-13 LAB — BASIC METABOLIC PANEL
Anion gap: 8 (ref 5–15)
BUN: 12 mg/dL (ref 6–20)
CHLORIDE: 107 mmol/L (ref 101–111)
CO2: 21 mmol/L — AB (ref 22–32)
CREATININE: 0.92 mg/dL (ref 0.61–1.24)
Calcium: 7.8 mg/dL — ABNORMAL LOW (ref 8.9–10.3)
GFR calc Af Amer: >60 mL/min (ref >60)
GFR calc non Af Amer: >60 mL/min (ref >60)
Glucose, Bld: 101 mg/dL — ABNORMAL HIGH (ref 65–99)
Potassium: 3.6 mmol/L (ref 3.5–5.1)
Sodium: 136 mmol/L (ref 135–145)

## 2016-03-13 LAB — MAGNESIUM
MAGNESIUM: 2.1 mg/dL (ref 1.7–2.4)
Magnesium: 2.2 mg/dL (ref 1.7–2.4)

## 2016-03-13 LAB — GLUCOSE, CAPILLARY
GLUCOSE-CAPILLARY: 100 mg/dL — AB (ref 65–99)
GLUCOSE-CAPILLARY: 105 mg/dL — AB (ref 65–99)
GLUCOSE-CAPILLARY: 113 mg/dL — AB (ref 65–99)
GLUCOSE-CAPILLARY: 193 mg/dL — AB (ref 65–99)
GLUCOSE-CAPILLARY: 224 mg/dL — AB (ref 65–99)
GLUCOSE-CAPILLARY: 256 mg/dL — AB (ref 65–99)
GLUCOSE-CAPILLARY: 87 mg/dL (ref 65–99)
Glucose-Capillary: 102 mg/dL — ABNORMAL HIGH (ref 65–99)
Glucose-Capillary: 117 mg/dL — ABNORMAL HIGH (ref 65–99)
Glucose-Capillary: 104 mg/dL — ABNORMAL HIGH (ref 65–99)
Glucose-Capillary: 103 mg/dL — ABNORMAL HIGH (ref 65–99)
Glucose-Capillary: 94 mg/dL (ref 65–99)
Glucose-Capillary: 111 mg/dL — ABNORMAL HIGH (ref 65–99)
Glucose-Capillary: 153 mg/dL — ABNORMAL HIGH (ref 65–99)
Glucose-Capillary: 152 mg/dL — ABNORMAL HIGH (ref 65–99)

## 2016-03-13 LAB — CREATININE, SERUM
Creatinine, Ser: 1.17 mg/dL (ref 0.61–1.24)
GFR calc Af Amer: >60 mL/min (ref >60)

## 2016-03-13 MED ORDER — POTASSIUM CHLORIDE 10 MEQ/50ML IV SOLN
10.0000 meq | INTRAVENOUS | Status: AC
  Administered 2016-03-13 (×3): 10 meq via INTRAVENOUS

## 2016-03-13 MED ORDER — FUROSEMIDE 10 MG/ML IJ SOLN
20.0000 mg | Freq: Two times a day (BID) | INTRAMUSCULAR | Status: DC
  Administered 2016-03-13 – 2016-03-14 (×4): 20 mg via INTRAVENOUS
  Filled 2016-03-13 (×4): qty 2

## 2016-03-13 MED ORDER — INSULIN ASPART 100 UNIT/ML ~~LOC~~ SOLN
0.0000 [IU] | SUBCUTANEOUS | Status: DC
  Administered 2016-03-13: 12 [IU] via SUBCUTANEOUS
  Administered 2016-03-13: 4 [IU] via SUBCUTANEOUS
  Administered 2016-03-13: 8 [IU] via SUBCUTANEOUS
  Administered 2016-03-14 (×4): 4 [IU] via SUBCUTANEOUS
  Administered 2016-03-15: 2 [IU] via SUBCUTANEOUS

## 2016-03-13 MED ORDER — METOCLOPRAMIDE HCL 5 MG/ML IJ SOLN
10.0000 mg | Freq: Four times a day (QID) | INTRAMUSCULAR | Status: DC
  Administered 2016-03-13 – 2016-03-19 (×21): 10 mg via INTRAVENOUS
  Filled 2016-03-13 (×21): qty 2

## 2016-03-13 MED ORDER — AMIODARONE HCL 200 MG PO TABS
200.0000 mg | ORAL_TABLET | Freq: Every day | ORAL | Status: DC
  Administered 2016-03-14 – 2016-03-19 (×6): 200 mg via ORAL
  Filled 2016-03-13 (×6): qty 1

## 2016-03-13 MED ORDER — ALLOPURINOL 300 MG PO TABS
300.0000 mg | ORAL_TABLET | Freq: Every day | ORAL | Status: DC
  Administered 2016-03-14 – 2016-03-19 (×6): 300 mg via ORAL
  Filled 2016-03-13 (×6): qty 1

## 2016-03-13 MED ORDER — MUPIROCIN 2 % EX OINT
TOPICAL_OINTMENT | Freq: Two times a day (BID) | CUTANEOUS | Status: DC
  Administered 2016-03-13 – 2016-03-16 (×8): via NASAL
  Administered 2016-03-17 (×2): 1 via NASAL
  Administered 2016-03-18 (×2): via NASAL
  Administered 2016-03-19: 1 via NASAL
  Filled 2016-03-13 (×4): qty 22

## 2016-03-13 MED ORDER — LACTATED RINGERS IV SOLN: INTRAVENOUS | Status: DC

## 2016-03-13 MED ORDER — ATORVASTATIN CALCIUM 10 MG PO TABS
10.0000 mg | ORAL_TABLET | Freq: Every day | ORAL | Status: DC
  Administered 2016-03-14 – 2016-03-19 (×6): 10 mg via ORAL
  Filled 2016-03-13 (×6): qty 1

## 2016-03-13 MED ORDER — KETOROLAC TROMETHAMINE 15 MG/ML IJ SOLN
15.0000 mg | Freq: Four times a day (QID) | INTRAMUSCULAR | Status: AC
  Administered 2016-03-13 – 2016-03-14 (×3): 15 mg via INTRAVENOUS
  Filled 2016-03-13 (×3): qty 1

## 2016-03-13 MED ORDER — INSULIN DETEMIR 100 UNIT/ML ~~LOC~~ SOLN
10.0000 [IU] | Freq: Two times a day (BID) | SUBCUTANEOUS | Status: DC
  Administered 2016-03-13 – 2016-03-14 (×4): 10 [IU] via SUBCUTANEOUS
  Filled 2016-03-13 (×5): qty 0.1

## 2016-03-13 NOTE — Progress Notes (Signed)
Social visit  Doing reasonably well post op  A paced  Feel free to call me at any time with any EP related concerns  Witham Grayer MD, [facility] 03/13/2016 2:47 PM

## 2016-03-13 NOTE — Progress Notes (Signed)
1 Day Post-Op Procedure(s) (LRB): MINIMALLY INVASIVE MAZE PROCEDURE (N/A) TRANSESOPHAGEAL ECHOCARDIOGRAM (TEE) (N/A) CLIPPING OF ATRIAL APPENDAGE (Left) PATENT FORAMEN OVALE CLOSURE TIMES 2 Subjective: Some difficulty with pain-we'll add low-dose Toradol Abdomen distended and tympanic-we'll start IV Reglan Sinus rhythm a paste Ambulated 100 feet  Objective: Vital signs in last 24 hours: Temp:  [99.5 F (37.5 C)-101.8 F (38.8 C)] 99.9 F (37.7 C) (10/14 1100) Pulse Rate:  [77-91] 88 (10/14 1100) Cardiac Rhythm: Atrial paced (10/14 1000) Resp:  [0-34] 17 (10/14 1100) BP: (91-121)/(63-85) 110/79 (10/14 1100) SpO2:  [92 %-100 %] 95 % (10/14 1100) Arterial Line BP: (82-123)/(49-74) 111/61 (10/14 1000) FiO2 (%):  [40 %-60 %] 40 % (10/13 1749) Weight:  [268 lb 15.4 oz (122 kg)] 268 lb 15.4 oz (122 kg) (10/14 0500)  Hemodynamic parameters for last 24 hours: CVP:  [1 mmHg-15 mmHg] 4 mmHg  Intake/Output from previous day: 10/13 0701 - 10/14 0700 In: 8129.2 [P.O.:140; I.V.:4449.2; Blood:1590; IV Piggyback:1950] Out: [id number] [Urine:3780; Blood:1625; Chest Tube:790] Intake/Output this shift: Total I/O In: 150 [I.V.:150] Out: 245 [Urine:175; Chest Tube:70]  Neuro intact Lungs clear  Lab Results:  Recent Labs  03/12/16 2030 03/12/16 2038 03/13/16 0400  WBC 14.2*  --  12.9*  HGB 14.2 13.3 13.3  HCT 40.5 39.0 38.3*  PLT 187  --  182   BMET:  Recent Labs  03/11/16 0913  03/12/16 2038 03/13/16 0400  NA 138  < > 140 136  K 4.0  < > 4.2 3.6  CL 105  < > 105 107  CO2 22  --   --  21*  GLUCOSE 174*  < > 121* 101*  BUN 19  < > 15 12  CREATININE 1.25*  < > 1.00 0.92  CALCIUM 10.0  --   --  7.8*  < > = values in this interval not displayed.  PT/INR:  Recent Labs  03/12/16 1439  LABPROT 15.5*  INR 1.22   ABG    Component Value Date/Time   PHART 7.323 (L) 03/12/2016 2034   HCO3 21.5 03/12/2016 2034   TCO2 23 03/12/2016 2038   ACIDBASEDEF 4.0 (H) 03/12/2016 2034   O2SAT 94.0 03/12/2016 2034   CBG (last 3)   Recent Labs  03/13/16 0853 03/13/16 0955 03/13/16 1115  GLUCAP 94 111* 153*    Assessment/Plan: S/P Procedure(s) (LRB): MINIMALLY INVASIVE MAZE PROCEDURE (N/A) TRANSESOPHAGEAL ECHOCARDIOGRAM (TEE) (N/A) CLIPPING OF ATRIAL APPENDAGE (Left) PATENT FORAMEN OVALE CLOSURE TIMES 2 Mobilize Diuresis Diabetes control See progression orders   LOS: 1 day    Tharon Aquas Trigt III 03/13/2016

## 2016-03-13 NOTE — Progress Notes (Signed)
Dr. Prescott Gum informed about fever, anxiety, labs, incentive use and neo and insulin being off. No new orders.

## 2016-03-14 ENCOUNTER — Inpatient Hospital Stay (HOSPITAL_COMMUNITY): Payer: 59

## 2016-03-14 LAB — CBC
HCT: 37.5 % — ABNORMAL LOW (ref 39.0–52.0)
Hemoglobin: 13 g/dL (ref 13.0–17.0)
MCH: 28.8 pg (ref 26.0–34.0)
MCHC: 34.7 g/dL (ref 30.0–36.0)
MCV: 83.1 fL (ref 78.0–100.0)
Platelets: 142 10*3/uL — ABNORMAL LOW (ref 150–400)
RBC: 4.51 MIL/uL (ref 4.22–5.81)
RDW: 13.7 % (ref 11.5–15.5)
WBC: 13 10*3/uL — ABNORMAL HIGH (ref 4.0–10.5)

## 2016-03-14 LAB — BASIC METABOLIC PANEL
Anion gap: 9 (ref 5–15)
BUN: 23 mg/dL — ABNORMAL HIGH (ref 6–20)
CO2: 25 mmol/L (ref 22–32)
Calcium: 8.7 mg/dL — ABNORMAL LOW (ref 8.9–10.3)
Chloride: 101 mmol/L (ref 101–111)
Creatinine, Ser: 1.17 mg/dL (ref 0.61–1.24)
GFR calc Af Amer: >60 mL/min (ref >60)
GFR calc non Af Amer: >60 mL/min (ref >60)
Glucose, Bld: 177 mg/dL — ABNORMAL HIGH (ref 65–99)
Potassium: 4.1 mmol/L (ref 3.5–5.1)
Sodium: 135 mmol/L (ref 135–145)

## 2016-03-14 LAB — GLUCOSE, CAPILLARY
GLUCOSE-CAPILLARY: 181 mg/dL — AB (ref 65–99)
GLUCOSE-CAPILLARY: 180 mg/dL — AB (ref 65–99)
GLUCOSE-CAPILLARY: 195 mg/dL — AB (ref 65–99)
Glucose-Capillary: 189 mg/dL — ABNORMAL HIGH (ref 65–99)
Glucose-Capillary: 159 mg/dL — ABNORMAL HIGH (ref 65–99)

## 2016-03-14 MED ORDER — ENOXAPARIN SODIUM 40 MG/0.4ML ~~LOC~~ SOLN
40.0000 mg | SUBCUTANEOUS | Status: DC
  Administered 2016-03-14 – 2016-03-17 (×4): 40 mg via SUBCUTANEOUS
  Filled 2016-03-14 (×4): qty 0.4

## 2016-03-14 NOTE — Progress Notes (Signed)
Patient placed on CPAP using his nasal pillows from home for HS.  Auto titration mode(min 4, max 20) used as patient is unsure of home settings.  Patient tolerated well and is familiar with equipment and procedure.

## 2016-03-14 NOTE — Progress Notes (Signed)
Patient refusing CPAP therapy at this time, stating that he doesn't think he will sleep well tonight. Patient says he will try tomorrow night.  Patient was instructed to notify RN or RT if he wants to wear.  CPAP machine left in patient's room.

## 2016-03-14 NOTE — Progress Notes (Signed)
2 Days Post-Op Procedure(s) (LRB): MINIMALLY INVASIVE MAZE PROCEDURE (N/A) TRANSESOPHAGEAL ECHOCARDIOGRAM (TEE) (N/A) CLIPPING OF ATRIAL APPENDAGE (Left) PATENT FORAMEN OVALE CLOSURE TIMES 2 Subjective: Incisional pain improved, patient ambulatory Sinus rhythm 60/m, currently A paced at 80 to assist with diuresis Chest tube drainage continues significant-250 cc first shift today  Objective: Vital signs in last 24 hours: Temp:  [97.4 F (36.3 C)-100.4 F (38 C)] 99 F (37.2 C) (10/15 1600) Pulse Rate:  [88-93] 89 (10/15 1900) Cardiac Rhythm: Atrial paced (10/15 1800) Resp:  [0-20] 11 (10/15 1900) BP: (95-120)/(54-95) 106/61 (10/15 1900) SpO2:  [92 %-100 %] 94 % (10/15 1900) Weight:  [269 lb (122 kg)] 269 lb (122 kg) (10/15 0500)  Hemodynamic parameters for last 24 hours: CVP:  [10 mmHg-16 mmHg] 16 mmHg  Intake/Output from previous day: 10/14 0701 - 10/15 0700 In: 1240.3 [P.O.:720; I.V.:390.3; IV Piggyback:50] Out: [id number] [Urine:2975; Chest Tube:850] Intake/Output this shift: No intake/output data recorded.  Mild edema Lungs clear Abdomen soft  Lab Results:  Recent Labs  03/13/16 1700 03/13/16 1832 03/14/16 0400  WBC 14.5*  --  13.0*  HGB 13.8 13.9 13.0  HCT 38.8* 41.0 37.5*  PLT 167  --  142*   BMET:  Recent Labs  03/13/16 0400  03/13/16 1832 03/14/16 0400  NA 136  --  135 135  K 3.6  --  3.9 4.1  CL 107  --  100* 101  CO2 21*  --   --  25  GLUCOSE 101*  --  248* 177*  BUN 12  --  17 23*  CREATININE 0.92  < > 1.00 1.17  CALCIUM 7.8*  --   --  8.7*  < > = values in this interval not displayed.  PT/INR:  Recent Labs  03/12/16 1439  LABPROT 15.5*  INR 1.22   ABG    Component Value Date/Time   PHART 7.323 (L) 03/12/2016 2034   HCO3 21.5 03/12/2016 2034   TCO2 20 03/13/2016 1832   ACIDBASEDEF 4.0 (H) 03/12/2016 2034   O2SAT 94.0 03/12/2016 2034   CBG (last 3)   Recent Labs  03/14/16 0802 03/14/16 1136 03/14/16 1648  GLUCAP 180* 189* 159*     Assessment/Plan: S/P Procedure(s) (LRB): MINIMALLY INVASIVE MAZE PROCEDURE (N/A) TRANSESOPHAGEAL ECHOCARDIOGRAM (TEE) (N/A) CLIPPING OF ATRIAL APPENDAGE (Left) PATENT FORAMEN OVALE CLOSURE TIMES 2 Continue current care Probably ready for transfer to 2 W. telemetry tomorrow   LOS: 2 days    Tharon Aquas Trigt III 03/14/2016

## 2016-03-15 ENCOUNTER — Inpatient Hospital Stay (HOSPITAL_COMMUNITY): Payer: 59

## 2016-03-15 ENCOUNTER — Encounter (HOSPITAL_COMMUNITY): Payer: Self-pay | Admitting: Thoracic Surgery (Cardiothoracic Vascular Surgery)

## 2016-03-15 LAB — GLUCOSE, CAPILLARY
GLUCOSE-CAPILLARY: 111 mg/dL — AB (ref 65–99)
GLUCOSE-CAPILLARY: 134 mg/dL — AB (ref 65–99)
GLUCOSE-CAPILLARY: 135 mg/dL — AB (ref 65–99)
Glucose-Capillary: 109 mg/dL — ABNORMAL HIGH (ref 65–99)
Glucose-Capillary: 115 mg/dL — ABNORMAL HIGH (ref 65–99)
Glucose-Capillary: 135 mg/dL — ABNORMAL HIGH (ref 65–99)

## 2016-03-15 LAB — CBC
HCT: 33.2 % — ABNORMAL LOW (ref 39.0–52.0)
Hemoglobin: 11.5 g/dL — ABNORMAL LOW (ref 13.0–17.0)
MCH: 28.8 pg (ref 26.0–34.0)
MCHC: 34.6 g/dL (ref 30.0–36.0)
MCV: 83.2 fL (ref 78.0–100.0)
Platelets: 138 10*3/uL — ABNORMAL LOW (ref 150–400)
RBC: 3.99 MIL/uL — ABNORMAL LOW (ref 4.22–5.81)
RDW: 13.5 % (ref 11.5–15.5)
WBC: 10.4 10*3/uL (ref 4.0–10.5)

## 2016-03-15 LAB — BASIC METABOLIC PANEL
Anion gap: 9 (ref 5–15)
BUN: 32 mg/dL — ABNORMAL HIGH (ref 6–20)
CO2: 25 mmol/L (ref 22–32)
Calcium: 8.4 mg/dL — ABNORMAL LOW (ref 8.9–10.3)
Chloride: 101 mmol/L (ref 101–111)
Creatinine, Ser: 1.15 mg/dL (ref 0.61–1.24)
GFR calc Af Amer: >60 mL/min (ref >60)
GFR calc non Af Amer: >60 mL/min (ref >60)
Glucose, Bld: 136 mg/dL — ABNORMAL HIGH (ref 65–99)
Potassium: 3.8 mmol/L (ref 3.5–5.1)
Sodium: 135 mmol/L (ref 135–145)

## 2016-03-15 MED ORDER — POTASSIUM CHLORIDE CRYS ER 20 MEQ PO TBCR
40.0000 meq | EXTENDED_RELEASE_TABLET | Freq: Once | ORAL | Status: AC
Start: 2016-03-15 — End: 2016-03-15
  Administered 2016-03-15: 40 meq via ORAL
  Filled 2016-03-15: qty 2

## 2016-03-15 MED ORDER — SODIUM CHLORIDE 0.9 % IV SOLN: 250.0000 mL | INTRAVENOUS | Status: DC | PRN

## 2016-03-15 MED ORDER — LISINOPRIL 5 MG PO TABS
5.0000 mg | ORAL_TABLET | Freq: Every day | ORAL | Status: DC
  Administered 2016-03-15 – 2016-03-19 (×5): 5 mg via ORAL
  Filled 2016-03-15 (×5): qty 1

## 2016-03-15 MED ORDER — MOVING RIGHT ALONG BOOK
Freq: Once | Status: AC
  Administered 2016-03-15: 12:00:00
  Filled 2016-03-15: qty 1

## 2016-03-15 MED ORDER — SODIUM CHLORIDE 0.9% FLUSH
3.0000 mL | Freq: Two times a day (BID) | INTRAVENOUS | Status: DC
  Administered 2016-03-15 – 2016-03-19 (×7): 3 mL via INTRAVENOUS

## 2016-03-15 MED ORDER — METOPROLOL SUCCINATE ER 25 MG PO TB24
25.0000 mg | ORAL_TABLET | Freq: Every day | ORAL | Status: DC
  Administered 2016-03-15 – 2016-03-19 (×5): 25 mg via ORAL
  Filled 2016-03-15 (×5): qty 1

## 2016-03-15 MED ORDER — FUROSEMIDE 40 MG PO TABS: 40.0000 mg | ORAL_TABLET | Freq: Two times a day (BID) | ORAL | Status: DC

## 2016-03-15 MED ORDER — INSULIN DETEMIR 100 UNIT/ML ~~LOC~~ SOLN
18.0000 [IU] | Freq: Two times a day (BID) | SUBCUTANEOUS | Status: DC
  Administered 2016-03-15 (×2): 18 [IU] via SUBCUTANEOUS
  Filled 2016-03-15 (×3): qty 0.18

## 2016-03-15 MED ORDER — INSULIN ASPART 100 UNIT/ML ~~LOC~~ SOLN
0.0000 [IU] | Freq: Three times a day (TID) | SUBCUTANEOUS | Status: DC
  Administered 2016-03-17 – 2016-03-18 (×3): 2 [IU] via SUBCUTANEOUS

## 2016-03-15 MED ORDER — METOPROLOL SUCCINATE ER 25 MG PO TB24: 50.0000 mg | ORAL_TABLET | Freq: Every day | ORAL | Status: DC

## 2016-03-15 MED ORDER — POTASSIUM CHLORIDE CRYS ER 20 MEQ PO TBCR
20.0000 meq | EXTENDED_RELEASE_TABLET | Freq: Two times a day (BID) | ORAL | Status: DC
  Administered 2016-03-15 (×2): 20 meq via ORAL
  Filled 2016-03-15 (×2): qty 1

## 2016-03-15 MED ORDER — SODIUM CHLORIDE 0.9% FLUSH: 3.0000 mL | INTRAVENOUS | Status: DC | PRN

## 2016-03-15 MED ORDER — FUROSEMIDE 10 MG/ML IJ SOLN
20.0000 mg | Freq: Four times a day (QID) | INTRAMUSCULAR | Status: AC
Start: 2016-03-15 — End: 2016-03-15
  Administered 2016-03-15 (×3): 20 mg via INTRAVENOUS
  Filled 2016-03-15 (×3): qty 2

## 2016-03-15 MED FILL — Heparin Sodium (Porcine) Inj 1000 Unit/ML: INTRAMUSCULAR | Qty: 30 | Status: AC

## 2016-03-15 MED FILL — Mannitol IV Soln 20%: INTRAVENOUS | Qty: 500 | Status: AC

## 2016-03-15 MED FILL — Electrolyte-R (PH 7.4) Solution: INTRAVENOUS | Qty: 4000 | Status: AC

## 2016-03-15 MED FILL — Lidocaine HCl IV Inj 20 MG/ML: INTRAVENOUS | Qty: 5 | Status: AC

## 2016-03-15 MED FILL — Sodium Bicarbonate IV Soln 8.4%: INTRAVENOUS | Qty: 150 | Status: AC

## 2016-03-15 MED FILL — Heparin Sodium (Porcine) Inj 1000 Unit/ML: INTRAMUSCULAR | Qty: 10 | Status: AC

## 2016-03-15 MED FILL — Sodium Chloride IV Soln 0.9%: INTRAVENOUS | Qty: 3000 | Status: AC

## 2016-03-15 NOTE — Progress Notes (Addendum)
      [address]       [place] [address]             [phone]        CARDIOTHORACIC SURGERY PROGRESS NOTE   R3 Days Post-Op Procedure(s) (LRB): MINIMALLY INVASIVE MAZE PROCEDURE (N/A) TRANSESOPHAGEAL ECHOCARDIOGRAM (TEE) (N/A) CLIPPING OF ATRIAL APPENDAGE (Left) PATENT FORAMEN OVALE CLOSURE TIMES 2  Subjective: Reports having had a good night.  No chest pain except w/ coughing.  No nausea but marginal appetite.  Has already walked twice this morning.  Objective: Vital signs: BP Readings from Last 1 Encounters:  03/15/16 113/76   Pulse Readings from Last 1 Encounters:  03/15/16 86   Resp Readings from Last 1 Encounters:  03/15/16 18   Temp Readings from Last 1 Encounters:  03/15/16 98.5 F (36.9 C) (Oral)    Hemodynamics:    Physical Exam:  Rhythm:   sinus  Breath sounds: clear  Heart sounds:  RRR  Incisions:  Clean and dry  Abdomen:  Soft, non-distended, non-tender  Extremities:  Warm, well-perfused  Chest tubes:  Decreasing but still significant volume thin serosanguinous output, no air leak    Intake/Output from previous day: 10/15 0701 - 10/16 0700 In: 1220 [P.O.:960; I.V.:260] Out: 3275 [Urine:2575; Chest Tube:700] Intake/Output this shift: No intake/output data recorded.  Lab Results:  CBC: Recent Labs  03/14/16 0400 03/15/16 0220  WBC 13.0* 10.4  HGB 13.0 11.5*  HCT 37.5* 33.2*  PLT 142* 138*    BMET:  Recent Labs  03/14/16 0400 03/15/16 0220  NA 135 135  K 4.1 3.8  CL 101 101  CO2 25 25  GLUCOSE 177* 136*  BUN 23* 32*  CREATININE 1.17 1.15  CALCIUM 8.7* 8.4*     PT/INR:   Recent Labs  03/12/16 1439  LABPROT 15.5*  INR 1.22    CBG (last 3)   Recent Labs  03/14/16 2109 03/15/16 0026 03/15/16 0323  GLUCAP 195* 135* 109*    ABG    Component Value Date/Time   PHART 7.323 (L) 03/12/2016 2034   PCO2ART 41.9 03/12/2016 2034   PO2ART 80.0 (L) 03/12/2016 2034   HCO3 21.5 03/12/2016 2034   TCO2 20 03/13/2016 1832   ACIDBASEDEF 4.0 (H) 03/12/2016 2034   O2SAT 94.0 03/12/2016 2034    CXR: Mild bibasilar atelectasis  Assessment/Plan: S/P Procedure(s) (LRB): MINIMALLY INVASIVE MAZE PROCEDURE (N/A) TRANSESOPHAGEAL ECHOCARDIOGRAM (TEE) (N/A) CLIPPING OF ATRIAL APPENDAGE (Left) PATENT FORAMEN OVALE CLOSURE TIMES 2  Overall doing well POD3 Maintaining sinus rhythm w/ stable BP Breathing comfortably w/ O2 sats 93-95% on RA Expected post op acute blood loss anemia, mild Expected post op atelectasis, mild Chronic diastolic CHF with expected post-op volume excess, diuresing well but weight still up 14 lbs Type II diabetes mellitus, adequate glycemic control [name] on CPAP Obesity   Mobilize  Diuresis  Leave chest tubes in until output decreases  Restart low dose beta blocker and ACE-I  Continue amiodarone  D/C pacing wires tomorrow if rhythm stable  Restart Xarelto prior to D/C once wires out  Continue levemir + SSI and resume oral agents once oral intake improves  Transfer step down   [name], MD 03/15/2016 7:45 AM

## 2016-03-15 NOTE — Progress Notes (Signed)
      [address]       [place] [address]             [phone]      Stable day, no new issues  BP 134/89   Pulse 82   Temp 99 F (37.2 C) (Oral)   Resp 15   Ht 6\' 2"  (1.88 m)   Wt 267 lb 1.6 oz (121.2 kg)   SpO2 94%   BMI 34.29 kg/m    Intake/Output Summary (Last 24 hours) at 03/15/16 1856 Last data filed at 03/15/16 1800  Gross per 24 hour  Intake             1470 ml  Output             3835 ml  Net            -2365 ml    Awaiting stepdown bed  Remo Lipps C. Roxan Hockey, MD [facility] [phone]

## 2016-03-16 LAB — BASIC METABOLIC PANEL
ANION GAP: 7 (ref 5–15)
BUN: 26 mg/dL — ABNORMAL HIGH (ref 6–20)
CALCIUM: 8.2 mg/dL — AB (ref 8.9–10.3)
CO2: 26 mmol/L (ref 22–32)
Chloride: 103 mmol/L (ref 101–111)
Creatinine, Ser: 1.09 mg/dL (ref 0.61–1.24)
GFR calc Af Amer: >60 mL/min (ref >60)
GLUCOSE: 120 mg/dL — AB (ref 65–99)
Potassium: 3.8 mmol/L (ref 3.5–5.1)
Sodium: 136 mmol/L (ref 135–145)

## 2016-03-16 LAB — CBC
HCT: 32 % — ABNORMAL LOW (ref 39.0–52.0)
Hemoglobin: 11.2 g/dL — ABNORMAL LOW (ref 13.0–17.0)
MCH: 29.1 pg (ref 26.0–34.0)
MCHC: 35 g/dL (ref 30.0–36.0)
MCV: 83.1 fL (ref 78.0–100.0)
PLATELETS: 170 10*3/uL (ref 150–400)
RBC: 3.85 MIL/uL — ABNORMAL LOW (ref 4.22–5.81)
RDW: 13.4 % (ref 11.5–15.5)
WBC: 7.5 10*3/uL (ref 4.0–10.5)

## 2016-03-16 LAB — GLUCOSE, CAPILLARY
GLUCOSE-CAPILLARY: 68 mg/dL (ref 65–99)
GLUCOSE-CAPILLARY: 73 mg/dL (ref 65–99)
Glucose-Capillary: 95 mg/dL (ref 65–99)
Glucose-Capillary: 110 mg/dL — ABNORMAL HIGH (ref 65–99)

## 2016-03-16 MED ORDER — POTASSIUM CHLORIDE CRYS ER 20 MEQ PO TBCR
40.0000 meq | EXTENDED_RELEASE_TABLET | Freq: Two times a day (BID) | ORAL | Status: DC
  Administered 2016-03-16 – 2016-03-17 (×4): 40 meq via ORAL
  Filled 2016-03-16 (×4): qty 2

## 2016-03-16 MED ORDER — CANAGLIFLOZIN 300 MG PO TABS
300.0000 mg | ORAL_TABLET | Freq: Every day | ORAL | Status: DC
  Administered 2016-03-16 – 2016-03-19 (×4): 300 mg via ORAL
  Filled 2016-03-16 (×4): qty 1

## 2016-03-16 MED ORDER — FUROSEMIDE 40 MG PO TABS
40.0000 mg | ORAL_TABLET | Freq: Two times a day (BID) | ORAL | Status: DC
  Administered 2016-03-17 (×2): 40 mg via ORAL
  Filled 2016-03-16 (×2): qty 1

## 2016-03-16 MED ORDER — GLIMEPIRIDE 4 MG PO TABS
4.0000 mg | ORAL_TABLET | Freq: Every day | ORAL | Status: DC
  Administered 2016-03-16 – 2016-03-19 (×4): 4 mg via ORAL
  Filled 2016-03-16 (×4): qty 1

## 2016-03-16 MED ORDER — FUROSEMIDE 10 MG/ML IJ SOLN
20.0000 mg | Freq: Four times a day (QID) | INTRAMUSCULAR | Status: AC
  Administered 2016-03-16 (×2): 20 mg via INTRAVENOUS
  Filled 2016-03-16 (×3): qty 2

## 2016-03-16 MED ORDER — POTASSIUM CHLORIDE CRYS ER 20 MEQ PO TBCR
40.0000 meq | EXTENDED_RELEASE_TABLET | Freq: Once | ORAL | Status: AC
  Administered 2016-03-16: 40 meq via ORAL
  Filled 2016-03-16: qty 2

## 2016-03-16 MED ORDER — ZOLPIDEM TARTRATE 5 MG PO TABS: 5.0000 mg | ORAL_TABLET | Freq: Every evening | ORAL | Status: DC | PRN

## 2016-03-16 NOTE — Progress Notes (Signed)
Attempted to call report. RN to call back. 

## 2016-03-16 NOTE — Progress Notes (Signed)
Temporary pacer wires pulled per order without complications.

## 2016-03-16 NOTE — Progress Notes (Signed)
Patient Tx 2W20 from 2S. Patient alert and oriented. Patient oriented to the room and unit. CT to wall suction 20 continuous. Personal belongings at the bedside. Patient lunch called for. Call bell within reach. Will continue to monitor.  Domingo Dimes RN

## 2016-03-16 NOTE — Progress Notes (Signed)
CARDIAC REHAB PHASE I   PRE:  Rate/Rhythm: 74 SR  BP:  Sitting: 121/72        SaO2: 98 RA  MODE:  Ambulation: 1280 ft   POST:  Rate/Rhythm: 87 SR  BP:  Sitting: 132/85         SaO2: 97 RA  Pt ambulated 1280 ft on RA, assist x1, steady gait, tolerated well with no complaints other than mild fatigue with distance, some mild DOE, declined rest stop. Pt to recliner after walk, call bell within reach. Will follow.    [place], RN, BSN 03/16/2016 3:03 PM

## 2016-03-16 NOTE — Progress Notes (Signed)
Pt refused cpap at this time.  RT will monitor. 

## 2016-03-16 NOTE — Progress Notes (Signed)
Patient transferred to 2W room 20. All belongings accounted for and taken with patient. E link notified of transfer.

## 2016-03-16 NOTE — Progress Notes (Signed)
      [address]       [place] [address]             [phone]        CARDIOTHORACIC SURGERY PROGRESS NOTE   R4 Days Post-Op Procedure(s) (LRB): MINIMALLY INVASIVE MAZE PROCEDURE (N/A) TRANSESOPHAGEAL ECHOCARDIOGRAM (TEE) (N/A) CLIPPING OF ATRIAL APPENDAGE (Left) PATENT FORAMEN OVALE CLOSURE TIMES 2  Subjective: No complaints.  Had a good night.  Objective: Vital signs: BP Readings from Last 1 Encounters:  03/16/16 123/78   Pulse Readings from Last 1 Encounters:  03/16/16 77   Resp Readings from Last 1 Encounters:  03/16/16 15   Temp Readings from Last 1 Encounters:  03/16/16 98.7 F (37.1 C) (Oral)    Hemodynamics:    Physical Exam:  Rhythm:   sinus  Breath sounds: clear  Heart sounds:  RRR  Incisions:  Clean and dry  Abdomen:  Soft, non-distended, non-tender  Extremities:  Warm, well-perfused  Chest tubes:  Decreasing but significant volume thin serosanguinous output, no air leak    Intake/Output from previous day: 10/16 0701 - 10/17 0700 In: 490 [P.O.:490] Out: 3380 [Urine:2900; Chest Tube:480] Intake/Output this shift: No intake/output data recorded.  Lab Results:  CBC: Recent Labs  03/15/16 0220 03/16/16 0439  WBC 10.4 7.5  HGB 11.5* 11.2*  HCT 33.2* 32.0*  PLT 138* 170    BMET:  Recent Labs  03/15/16 0220 03/16/16 0439  NA 135 136  K 3.8 3.8  CL 101 103  CO2 25 26  GLUCOSE 136* 120*  BUN 32* 26*  CREATININE 1.15 1.09  CALCIUM 8.4* 8.2*     PT/INR:  No results for input(s): LABPROT, INR in the last 72 hours.  CBG (last 3)   Recent Labs  03/15/16 1249 03/15/16 1642 03/15/16 2104  GLUCAP 135* 111* 134*    ABG    Component Value Date/Time   PHART 7.323 (L) 03/12/2016 2034   PCO2ART 41.9 03/12/2016 2034   PO2ART 80.0 (L) 03/12/2016 2034   HCO3 21.5 03/12/2016 2034   TCO2 20 03/13/2016 1832   ACIDBASEDEF 4.0 (H) 03/12/2016 2034   O2SAT 94.0 03/12/2016 2034     CXR: n/a  Assessment/Plan: S/P Procedure(s) (LRB): MINIMALLY INVASIVE MAZE PROCEDURE (N/A) TRANSESOPHAGEAL ECHOCARDIOGRAM (TEE) (N/A) CLIPPING OF ATRIAL APPENDAGE (Left) PATENT FORAMEN OVALE CLOSURE TIMES 2  Overall doing well POD4 Maintaining sinus rhythm w/ stable BP Breathing comfortably w/ O2 sats 93-95% on RA Expected post op acute blood loss anemia, mild Expected post op atelectasis, mild Chronic diastolic CHF with expected post-op volume excess, diuresing well but weight still up 9 lbs Type II diabetes mellitus, adequate glycemic control [name] on CPAP Obesity   Mobilize  Diuresis  Leave chest tubes in until output decreases  Continue ACE-I  Continue amiodarone  D/C pacing wires   Restart Xarelto prior to D/C once tubes out  Stop levemir + SS  Still awaiting bed for transfer  [name], MD 03/16/2016 7:44 AM

## 2016-03-16 NOTE — Progress Notes (Signed)
Report called to Lexi RN

## 2016-03-17 LAB — GLUCOSE, CAPILLARY
GLUCOSE-CAPILLARY: 87 mg/dL (ref 65–99)
Glucose-Capillary: 103 mg/dL — ABNORMAL HIGH (ref 65–99)
Glucose-Capillary: 136 mg/dL — ABNORMAL HIGH (ref 65–99)
Glucose-Capillary: 124 mg/dL — ABNORMAL HIGH (ref 65–99)

## 2016-03-17 LAB — BASIC METABOLIC PANEL
ANION GAP: 9 (ref 5–15)
BUN: 24 mg/dL — ABNORMAL HIGH (ref 6–20)
CO2: 25 mmol/L (ref 22–32)
Calcium: 8.8 mg/dL — ABNORMAL LOW (ref 8.9–10.3)
Chloride: 103 mmol/L (ref 101–111)
Creatinine, Ser: 1.12 mg/dL (ref 0.61–1.24)
GLUCOSE: 103 mg/dL — AB (ref 65–99)
POTASSIUM: 4.4 mmol/L (ref 3.5–5.1)
Sodium: 137 mmol/L (ref 135–145)

## 2016-03-17 MED ORDER — ALUM & MAG HYDROXIDE-SIMETH 200-200-20 MG/5ML PO SUSP
15.0000 mL | Freq: Three times a day (TID) | ORAL | Status: DC | PRN
  Administered 2016-03-17 – 2016-03-18 (×3): 15 mL via ORAL
  Filled 2016-03-17 (×3): qty 30

## 2016-03-17 MED ORDER — LACTULOSE 10 GM/15ML PO SOLN
30.0000 g | Freq: Once | ORAL | Status: DC
  Filled 2016-03-17: qty 45

## 2016-03-17 MED ORDER — ZOLPIDEM TARTRATE 5 MG PO TABS
5.0000 mg | ORAL_TABLET | Freq: Every day | ORAL | Status: DC
  Administered 2016-03-17 – 2016-03-18 (×2): 5 mg via ORAL
  Filled 2016-03-17 (×2): qty 1

## 2016-03-17 NOTE — Progress Notes (Signed)
CARDIAC REHAB PHASE I   PRE:  Rate/Rhythm: 83 SR  BP:  Sitting: 135/80        SaO2: 98 RA  MODE:  Ambulation: 980 ft   POST:  Rate/Rhythm: 98 SR  BP:  Sitting: 107/87         SaO2: 98 RA  Pt ambulated 980 ft on RA, chest tube, assist x1 (to carry chest tube, otherwise independent), steady gait, tolerated well with no complaints. Encouraged IS, additional ambulation x2 today. Pt to recliner after walk, feet elevated, call bell within reach. Will follow.   [id number] Lenna Sciara, RN, BSN 03/17/2016 10:51 AM

## 2016-03-17 NOTE — Discharge Summary (Signed)
Physician Discharge Summary       [address]       [place] [address]             [phone]    Patient ID: Colin Wood MRN: [id number] DOB/AGE: 1960-11-12 55 y.o.  Admit date: 03/12/2016 Discharge date: 03/19/2016  Admission Diagnoses: Long standing persistent atrial fibrillation  Active Diagnoses:  1. Patent foramen ovale 2.  DM (diabetes mellitus, type 2) (HCC) 3. Obesity 4. [name] (obstructive sleep apnea) 5. Atrial flutter ([facility]) 6. Depression 7. GERD (gastroesophageal reflux disease) 8. Hyperlipidemia 9. Hypothyroidism 10. Gout 11. ABL Anemia  Procedure (s):   Minimally-Invasive Maze Procedure             Complete bilateral atrial lesion set using cryothermy and bipolar radiofrequency ablation             Clipping of Left Atrial Appendage (Atriclip Pro245 left atrial clip, size 45 mm)   Closure of Patent Foramen Ovale by Dr. Roxy Manns on 03/12/2016.  History of Presenting Illness: Patient is a 55 year old obese white male with history of chronic persistent atrial fibrillation that has failed both medical therapy and catheter-based ablation who has been referred for surgical consultation to discuss further treatment options for management of atrial fibrillation. The patient's history of atrial fibrillation dates back to July 2012 when he was found to be in atrial fibrillation on routine follow-up EKG performed by his primary care physician. He was referred for cardiology consultation and initially evaluated by [name]. An echocardiogram performed at that time revealed normal left ventricular systolic function. The patient was suspected to have obstructive sleep apnea and referred for sleep consult which confirmed the presence of obstructive sleep apnea. He has been treated using CPAP ever since. He was eventually referred to [name] who has been following him for several years for atrial fibrillation. He has failed medical therapy on Tikosyn  and has been cardioverted on 2 occasions, neither of which worked. He underwent catheter-based Afibablation in 2014 and did well for approximately 6 months until atrial fibrillation recurred. He was seen in follow-up recently by [name] and echocardiogram revealed further enlargement of the left atrium with what appeared to be significant drop in left ventricular ejection fraction. Treatment options were discussed and the patient has subsequently been referred for surgical consultation to consider possible Maze procedure.  The patient is married and lives locally in [place] with his wife. 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 He lives a sedentary lifestyle and admits that he does not exercise at all on a regular basis. He has been moderately obese for much of his adult life. He has not been successful in losing any significant amount of weight. He describes a slow gradual progression of symptoms of decreased energy with occasional exertional shortness of breath. He has palpitations. He denies any history of chest pain or chest tightness either with activity or at rest. He has never had any resting shortness of breath, PND, orthopnea. He has chronic problems sleeping at night. He is uncertain whether not CPAP seems to help much. He also has chronic problems with reflux, chronic cough and chronic sinus congestion. He has been chronically anticoagulated using Xarelto without any significant bleeding complications.    Dr. Roxy Manns again discussed the indications, risks, and potential benefits of   Maze procedure with the patient and his wife in the office today. Alternative   treatment strategies of been discussed including continued medical therapy  versus another attempt at catheter-based ablation. The impact of comorbid   medical problems including obesity and obstructive sleep apnea were   discussed. Alternative surgical approaches for ablation have been discussed   including those performed with  and without the use of cardiopulmonary bypass anda comparison between conventional sternotomy and minimally-invasive techniques. The expected likelihood of long term freedom from recurrent symptomatic atrial fibrillation and/or atrial flutter following maze procedure has been discussed. The relative risks and benefits of each have been reviewed as they pertain to the patient's specific circumstances, and all of their questions have been addressed. The patient understands and accepts all potential associated risks of surgery. Patient was admitted on 03/12/2016 in order to undergo a minimally invasive maze procedure and closure of patent foramen ovale.   Brief Hospital Course:  The patient was extubated the evening of surgery without difficulty. He/she remained afebrile and hemodynamically stable. Gordy Councilman, a line, and foley were removed early in the post operative course. Chest tubes remained for several days until output decreased. They were removed on 03/18/2016. He was iintially A paced.He was volume over loaded and diuresed. He had ABL anemia. He did not require a post op transfusion. Last H and H was 11.2 and 32.  He was weaned off the insulin drip. The patient's HGA1C pre op was  7.5. Once he was tolerating a diet, home diabetic medicines (Invokana and Glimepiride) were restarted.  The patient's glucose remained well controlled. He will need further surveillance of his HGA1C by his medical doctor after discharge. Lopressor was started. The patient was felt surgically stable for transfer from the ICU to [facility] for further convalescence on 03/16/2016. He continues to progress with cardiac rehab. He was ambulating on room air. He has been tolerating a diet and has had a bowel movement. Epicardial pacing wires were removed on 03/16/2016. Chest tube sutures will be removed after discharge in the office on 03/30/2016. As discussed with Dr. Roxy Manns, will continue daily Lasix and potassium supplement for 6 more days.  Will decreased ecasa to 81 mg daily as is on Xarelto. The patient is felt surgically stable for discharge today.   Latest Vital Signs: Blood pressure 123/71, pulse 94, temperature 98.7 F (37.1 C), temperature source Oral, resp. rate 18, height 6\' 2"  (1.88 m), weight 247 lb 4.8 oz (112.2 kg), SpO2 95 %.  Physical Exam: Cardiovascular: RRR, no murmur Pulmonary: Clear to auscultation on left and slightly diminished at right base Abdomen: Soft, non tender, bowel sounds present. Extremities: Mild bilateral lower extremity edema. Wounds: Clean and dry.  No erythema or signs of infection.   Discharge Condition:Stable and discharged to home.  Recent laboratory studies:  Lab Results  Component Value Date   WBC 7.5 03/16/2016   HGB 11.2 (L) 03/16/2016   HCT 32.0 (L) 03/16/2016   MCV 83.1 03/16/2016   PLT 170 03/16/2016   Lab Results  Component Value Date   NA 137 03/17/2016   K 4.4 03/17/2016   CL 103 03/17/2016   CO2 25 03/17/2016   CREATININE 1.12 03/17/2016   GLUCOSE 103 (H) 03/17/2016    Diagnostic Studies:   EXAM: CHEST  2 VIEW  COMPARISON:  03/15/2016  FINDINGS: The stable cardiac silhouette with atrial clip. Volume loss in the RIGHT hemi thorax with RIGHT basilar atelectasis not changed. RIGHT apical pneumothorax is small measuring 17 mm from the apical chest wall compared to 16 mm for no significant change. LEFT lung clear. Chest tube removed.  IMPRESSION: 1. Stable  small RIGHT apical pneumothorax.  Chest tubes removed. 2. Volume loss in the RIGHT hemi thorax unchanged.   Electronically Signed   By: Suzy Bouchard M.D.   On: 03/19/2016 08:56  Ct Angio Chest Aorta W/cm &/or Wo/cm  Result Date: 03/01/2016 CLINICAL DATA:  Preoperative evaluation for upcoming minimally invasive Maze procedure EXAM: CT ANGIOGRAPHY CHEST, ABDOMEN AND PELVIS TECHNIQUE: Multidetector CT imaging through the chest, abdomen and pelvis was performed using the standard protocol  during bolus administration of intravenous contrast. Multiplanar reconstructed images and MIPs were obtained and reviewed to evaluate the vascular anatomy. CONTRAST:  75 mL Isovue 370 COMPARISON:  None. FINDINGS: CTA CHEST FINDINGS Cardiovascular: Truncus anomaly of the thoracic aorta is noted. The origins of the brachiocephalic vessels are otherwise within normal limits. No aneurysmal dilatation of the ascending aorta is seen. No evidence of dissection is noted. The pulmonary artery as visualized is within normal limits. Normal superior and inferior pulmonary veins are noted bilaterally. No filling defect is noted within the left atrium. A moderate size left atrial appendage is seen. No significant coronary calcifications are noted. Left main coronary artery and left anterior descending coronary artery is have a normal course posterior to the pulmonary artery. No downward course is identified. Mediastinum/Nodes: Thoracic inlet is within normal limits. No hilar or mediastinal adenopathy is identified. No significant axillary adenopathy is noted. The esophagus as visualize is within normal limits. Lungs/Pleura: Well-aerated bilaterally without focal infiltrate, effusion or pneumothorax. No sizable parenchymal nodule is seen. Musculoskeletal: Mild degenerative change of the thoracic spine is noted. No compression deformities are noted. Review of the MIP images confirms the above findings. CTA ABDOMEN AND PELVIS FINDINGS VASCULAR Aorta: The abdominal aorta is well visualized and demonstrates no aneurysmal dilatation. Normal bifurcation of the common iliac arteries is noted bilaterally. No significant aneurysmal dilatation it is seen. Minimal atherosclerotic calcifications are noted. Celiac: Widely patent SMA: Widely patent Renals: Single renal arteries are identified bilaterally which are widely patent. IMA: Widely patent. Outflow: The left common iliac artery measures approximately 16 mm in greatest dimension. The  right common iliac artery measures approximately 13 mm in greatest dimension. The common femoral arteries are relatively symmetrical measuring approximately 12 mm in diameter. Veins: The IVC is well visualized and within normal limits. No significant narrowing is seen. No definitive portal or hepatic venous abnormality is noted although visualization is limited due to the timing of the contrast bolus. Review of the MIP images confirms the above findings. NON-VASCULAR Hepatobiliary: Diffuse fatty infiltration of the liver is noted. The gallbladder is within normal limits. Pancreas: Unremarkable. No pancreatic ductal dilatation or surrounding inflammatory changes. Spleen: Normal in size without focal abnormality. Adrenals/Urinary Tract: The adrenal glands are within normal limits. No renal calculi or obstructive changes are seen. Bilateral renal cystic changes are noted. Stomach/Bowel: Stomach is within normal limits. Appendix appears normal. No evidence of bowel wall thickening, distention, or inflammatory changes. Scattered mild diverticular change is noted. Lymphatic: No significant lymphadenopathy is identified. Reproductive: Prostate is unremarkable. Other: No abdominal wall hernia or abnormality. No abdominopelvic ascites. Musculoskeletal: No acute or significant osseous findings. Review of the MIP images confirms the above findings. IMPRESSION: No evidence of aortic dissection or aneurysmal dilatation. Mild atherosclerotic calcifications are noted within the abdominal aorta. Normal pulmonary venous anatomy. Left atrial appendage is noted. No thrombus is seen. Fatty liver. Bilateral renal cystic change. Electronically Signed   By: Inez Catalina M.D.   On: 03/01/2016 16:37   Ct Angio Abd/pel W/ And/or  W/o  Result Date: 03/01/2016 CLINICAL DATA:  Preoperative evaluation for upcoming minimally invasive Maze procedure EXAM: CT ANGIOGRAPHY CHEST, ABDOMEN AND PELVIS TECHNIQUE: Multidetector CT imaging through the  chest, abdomen and pelvis was performed using the standard protocol during bolus administration of intravenous contrast. Multiplanar reconstructed images and MIPs were obtained and reviewed to evaluate the vascular anatomy. CONTRAST:  75 mL Isovue 370 COMPARISON:  None. FINDINGS: CTA CHEST FINDINGS Cardiovascular: Truncus anomaly of the thoracic aorta is noted. The origins of the brachiocephalic vessels are otherwise within normal limits. No aneurysmal dilatation of the ascending aorta is seen. No evidence of dissection is noted. The pulmonary artery as visualized is within normal limits. Normal superior and inferior pulmonary veins are noted bilaterally. No filling defect is noted within the left atrium. A moderate size left atrial appendage is seen. No significant coronary calcifications are noted. Left main coronary artery and left anterior descending coronary artery is have a normal course posterior to the pulmonary artery. No downward course is identified. Mediastinum/Nodes: Thoracic inlet is within normal limits. No hilar or mediastinal adenopathy is identified. No significant axillary adenopathy is noted. The esophagus as visualize is within normal limits. Lungs/Pleura: Well-aerated bilaterally without focal infiltrate, effusion or pneumothorax. No sizable parenchymal nodule is seen. Musculoskeletal: Mild degenerative change of the thoracic spine is noted. No compression deformities are noted. Review of the MIP images confirms the above findings. CTA ABDOMEN AND PELVIS FINDINGS VASCULAR Aorta: The abdominal aorta is well visualized and demonstrates no aneurysmal dilatation. Normal bifurcation of the common iliac arteries is noted bilaterally. No significant aneurysmal dilatation it is seen. Minimal atherosclerotic calcifications are noted. Celiac: Widely patent SMA: Widely patent Renals: Single renal arteries are identified bilaterally which are widely patent. IMA: Widely patent. Outflow: The left common  iliac artery measures approximately 16 mm in greatest dimension. The right common iliac artery measures approximately 13 mm in greatest dimension. The common femoral arteries are relatively symmetrical measuring approximately 12 mm in diameter. Veins: The IVC is well visualized and within normal limits. No significant narrowing is seen. No definitive portal or hepatic venous abnormality is noted although visualization is limited due to the timing of the contrast bolus. Review of the MIP images confirms the above findings. NON-VASCULAR Hepatobiliary: Diffuse fatty infiltration of the liver is noted. The gallbladder is within normal limits. Pancreas: Unremarkable. No pancreatic ductal dilatation or surrounding inflammatory changes. Spleen: Normal in size without focal abnormality. Adrenals/Urinary Tract: The adrenal glands are within normal limits. No renal calculi or obstructive changes are seen. Bilateral renal cystic changes are noted. Stomach/Bowel: Stomach is within normal limits. Appendix appears normal. No evidence of bowel wall thickening, distention, or inflammatory changes. Scattered mild diverticular change is noted. Lymphatic: No significant lymphadenopathy is identified. Reproductive: Prostate is unremarkable. Other: No abdominal wall hernia or abnormality. No abdominopelvic ascites. Musculoskeletal: No acute or significant osseous findings. Review of the MIP images confirms the above findings. IMPRESSION: No evidence of aortic dissection or aneurysmal dilatation. Mild atherosclerotic calcifications are noted within the abdominal aorta. Normal pulmonary venous anatomy. Left atrial appendage is noted. No thrombus is seen. Fatty liver. Bilateral renal cystic change. Electronically Signed   By: Inez Catalina M.D.   On: 03/01/2016 16:37       Discharge Instructions    Amb Referral to Cardiac Rehabilitation    Complete by:  As directed    Diagnosis:  Other Comment - PFO closure, MAZE      Discharge  Medications:   Medication List  STOP taking these medications   diazepam 5 MG tablet Commonly known as:  VALIUM   diltiazem 240 MG 24 hr capsule Commonly known as:  CARDIZEM CD   diphenhydrAMINE 25 mg capsule Commonly known as:  BENADRYL     TAKE these medications   allopurinol 300 MG tablet Commonly known as:  ZYLOPRIM Take 1 tablet (300 mg total) by mouth daily.   amiodarone 200 MG tablet Commonly known as:  PACERONE Take 1 tablet (200 mg total) by mouth daily. What changed:  when to take this   aspirin EC 81 MG tablet Take 4 tablets (325 mg total) by mouth daily.   atorvastatin 10 MG tablet Commonly known as:  LIPITOR Take 1 tablet (10 mg total) by mouth daily.   azelastine 0.1 % nasal spray Commonly known as:  ASTELIN Place 2 sprays into both nostrils 2 (two) times daily.   canagliflozin 300 MG Tabs tablet Commonly known as:  INVOKANA Take 1 tablet (300 mg total) by mouth daily before breakfast.   dexlansoprazole 60 MG capsule Commonly known as:  DEXILANT Take 1 capsule (60 mg total) by mouth daily.   famotidine 20 MG tablet Commonly known as:  PEPCID Take 1 tablet (20 mg total) by mouth at bedtime.   fenofibrate 160 MG tablet Take 1 tablet (160 mg total) by mouth daily.   fluticasone 50 MCG/ACT nasal spray Commonly known as:  FLONASE Place 2 sprays into both nostrils daily.   furosemide 40 MG tablet Commonly known as:  LASIX Take 1 tablet (40 mg total) by mouth daily. For 6 days then stop. Start taking on:  03/20/2016   glimepiride 4 MG tablet Commonly known as:  AMARYL Take 1 tablet (4 mg total) by mouth daily with breakfast.   levothyroxine 200 MCG tablet Commonly known as:  SYNTHROID, LEVOTHROID Take 1 tablet (200 mcg total) by mouth daily before breakfast.   lisinopril 5 MG tablet Commonly known as:  PRINIVIL,ZESTRIL Take 1 tablet (5 mg total) by mouth daily.   metFORMIN 1000 MG tablet Commonly known as:  GLUCOPHAGE Take 1 tablet  (1,000 mg total) by mouth 2 (two) times daily with a meal.   metoprolol succinate 25 MG 24 hr tablet Commonly known as:  TOPROL-XL Take 1 tablet (25 mg total) by mouth daily. What changed:  medication strength  how much to take   omega-3 acid ethyl esters 1 g capsule Commonly known as:  LOVAZA Take 2 capsules by mouth  two times daily  (4 gram total daily)   oxyCODONE 5 MG immediate release tablet Commonly known as:  Oxy IR/ROXICODONE Take 1 tablet (5 mg total) by mouth every 4 (four) hours as needed for severe pain.   potassium chloride SA 20 MEQ tablet Commonly known as:  K-DUR,KLOR-CON Take 1 tablet (20 mEq total) by mouth daily. For 6 days then stop. Start taking on:  03/20/2016   rivaroxaban 20 MG Tabs tablet Commonly known as:  XARELTO Take 1 tablet by mouth at  bedtime      The patient has been discharged on:   1.Beta Blocker:  Yes [  x ]                              No   [   ]  If No, reason:  2.Ace Inhibitor/ARB: Yes [x   ]                                     No  [    ]                                     If No, reason:  3.Statin:   Yes [  x ]                  No  [   ]                  If No, reason:  4.Ecasa:  Yes  [  x ]                  No   [   ]                  If No, reason:  Follow Up Appointments: Follow-up Information    Paige Grayer, MD .   Specialty:  Cardiology Why:  Call for a follow up appointment Contact information: [address] [place] [address] [phone]        [name], MD Follow up on 04/05/2016.   Specialty:  Cardiothoracic Surgery Why:  PA/LAT CXR to be taken (at [facility] which is in the same building as Dr. [name] office) on 04/05/2016 at 9:00 am ;Appoitnment time is at 9:30 am  Contact information: [address] [place] [address] [place], MD .   Specialty:  Internal Medicine Why:  Call for a follow up  appoitnment regarding further surveillance of HGA1C 7.5 and diabetes management Contact information: [address] [place] [address] [phone]        Nurse Follow up on 03/30/2016.   Why:  Appointment is with nurse only to have chest tube sutures removed Contact information: [place] [address]          Signed: Lars Pinks MPA-C 03/19/2016, 12:14 PM

## 2016-03-17 NOTE — Progress Notes (Addendum)
      [address]       [place] [address]             [phone]        5 Days Post-Op Procedure(s) (LRB): MINIMALLY INVASIVE MAZE PROCEDURE (N/A) TRANSESOPHAGEAL ECHOCARDIOGRAM (TEE) (N/A) CLIPPING OF ATRIAL APPENDAGE (Left) PATENT FORAMEN OVALE CLOSURE TIMES 2  Subjective: Patient's complaints include not sleeping well and no bowel movement yet.  Objective: Vital signs in last 24 hours: Temp:  [97.6 F (36.4 C)-98.9 F (37.2 C)] 98.4 F (36.9 C) (10/18 0825) Pulse Rate:  [73-89] 81 (10/18 0825) Cardiac Rhythm: Normal sinus rhythm (10/18 0735) Resp:  [20-23] 20 (10/18 0424) BP: (111-138)/(48-79) 138/79 (10/18 0825) SpO2:  [96 %-100 %] 97 % (10/18 0825) Weight:  [254 lb 4.8 oz (115.3 kg)] 254 lb 4.8 oz (115.3 kg) (10/18 0424)  Pre op weight 115 kg Current Weight  03/17/16 254 lb 4.8 oz (115.3 kg)      Intake/Output from previous day: 10/17 0701 - 10/18 0700 In: 480 [P.O.:480] Out: 3970 [Urine:3300; Chest Tube:670]   Physical Exam:  Cardiovascular: RRR, no murmur Pulmonary: Clear to auscultation on left and slightly diminished at right base Abdomen: Soft, non tender, bowel sounds present. Extremities: Mild bilateral lower extremity edema. Wounds: Clean and dry.  No erythema or signs of infection.  Lab Results: CBC: Recent Labs  03/15/16 0220 03/16/16 0439  WBC 10.4 7.5  HGB 11.5* 11.2*  HCT 33.2* 32.0*  PLT 138* 170   BMET:  Recent Labs  03/16/16 0439 03/17/16 0312  NA 136 137  K 3.8 4.4  CL 103 103  CO2 26 25  GLUCOSE 120* 103*  BUN 26* 24*  CREATININE 1.09 1.12  CALCIUM 8.2* 8.8*    PT/INR:  Lab Results  Component Value Date   INR 1.22 03/12/2016   INR 1.05 03/11/2016   INR 0.95 01/12/2016   ABG:  INR: Will add last result for INR, ABG once components are confirmed Will add last 4 CBG results once components are confirmed  Assessment/Plan:  1. CV - SR in the 80's. On Amiodarone 200 mg daily, lisinopril 5  mg dily, and Toprol XL 25 mg daily. Per Dr. Roxy Manns, will restart Xarelto at discharge. 2.  Pulmonary - On room air. Chest tube with 670 cc last 24 hours. Encourage incentive spirometer. 3. Chronic diastolic CHF- On Lasix 40 mg bid 4.  Acute blood loss anemia - Last H and H stable at 11.2 and 32 5. DM-CBGs 73/110/103. On Invokana 300 mg daily and Glimeperide 4 mg daily. Pre op HGA1C 7.5 6. LOC constipation 7. Per patient request, Ambien for sleep   ZIMMERMAN,DONIELLE MPA-C 03/17/2016,8:35 AM    I have seen and examined the patient and agree with the assessment and plan as outlined.  [name], MD 03/17/2016 3:53 PM

## 2016-03-17 NOTE — Discharge Instructions (Signed)
Activity: 1.May walk up steps                2.No lifting more than ten pounds for two weeks.                 3.No driving for two weeks.                4.Stop any activity that causes chest pain, shortness of breath, dizziness,  sweating or excessive weakness.                5.Avoid straining.                6.Continue with your breathing exercises daily.  Diet: Diabetic diet  Wound Care: May shower.  Clean wounds with mild soap and water daily. Contact the office at [phone] if any problems arise.

## 2016-03-18 LAB — GLUCOSE, CAPILLARY
GLUCOSE-CAPILLARY: 139 mg/dL — AB (ref 65–99)
Glucose-Capillary: 113 mg/dL — ABNORMAL HIGH (ref 65–99)
Glucose-Capillary: 104 mg/dL — ABNORMAL HIGH (ref 65–99)
Glucose-Capillary: 116 mg/dL — ABNORMAL HIGH (ref 65–99)

## 2016-03-18 MED ORDER — DEXLANSOPRAZOLE 60 MG PO CPDR
60.0000 mg | DELAYED_RELEASE_CAPSULE | Freq: Every day | ORAL | Status: DC
  Administered 2016-03-19: 60 mg via ORAL
  Filled 2016-03-18: qty 1

## 2016-03-18 MED ORDER — POTASSIUM CHLORIDE CRYS ER 20 MEQ PO TBCR
40.0000 meq | EXTENDED_RELEASE_TABLET | Freq: Every day | ORAL | Status: DC
Start: 2016-03-18 — End: 2016-03-19
  Administered 2016-03-18 – 2016-03-19 (×2): 40 meq via ORAL
  Filled 2016-03-18 (×2): qty 2

## 2016-03-18 MED ORDER — RIVAROXABAN 20 MG PO TABS
20.0000 mg | ORAL_TABLET | Freq: Every day | ORAL | Status: DC
  Administered 2016-03-18: 20 mg via ORAL
  Filled 2016-03-18: qty 1

## 2016-03-18 MED ORDER — FUROSEMIDE 40 MG PO TABS
40.0000 mg | ORAL_TABLET | Freq: Every day | ORAL | Status: DC
  Administered 2016-03-18 – 2016-03-19 (×2): 40 mg via ORAL
  Filled 2016-03-18 (×2): qty 1

## 2016-03-18 NOTE — Progress Notes (Signed)
[id number] Pt up in hall walking independently with steady gait. Tolerated well. Still with some pain from chest tube. Education completed with pt who voiced understanding. Encouraged IS. Wrote down how to view discharge video. Discussed CRP 2 and pt will have family check with insurance for coverage. Will refer to [place] [facility]. Gave diabetic diet and discussed carb counting. Graylon Good RN BSN 03/18/2016 10:07 AM

## 2016-03-18 NOTE — Progress Notes (Signed)
Patient walking and noticed wet tee shirt.  Chest tube dressing re-enforced and patient to monitor more closely. Payton Emerald, RN

## 2016-03-18 NOTE — Progress Notes (Signed)
Right side Y-chest tubes removed by PA Erin Barrett.  Resistance met and pain not able to tolerate removal by RN. Applied petroleum gauze, 4x4 drain sponge and hypofix tape.  Patient asked to call for drainage, shortness of breath or feeling bad. Pt resting with call bell within reach.  Will continue to monitor. Payton Emerald, RN

## 2016-03-18 NOTE — Progress Notes (Addendum)
      [address]       [place] [address]             [phone]        6 Days Post-Op Procedure(s) (LRB): MINIMALLY INVASIVE MAZE PROCEDURE (N/A) TRANSESOPHAGEAL ECHOCARDIOGRAM (TEE) (N/A) CLIPPING OF ATRIAL APPENDAGE (Left) PATENT FORAMEN OVALE CLOSURE TIMES 2  Subjective: Patient had bowel movement and slept better. He had bad heartburn yesterday.  Objective: Vital signs in last 24 hours: Temp:  [98.3 F (36.8 C)-99.4 F (37.4 C)] 98.4 F (36.9 C) (10/19 0511) Pulse Rate:  [81-88] 88 (10/19 0511) Cardiac Rhythm: Normal sinus rhythm (10/18 2004) Resp:  [18-21] 18 (10/19 0511) BP: (120-138)/(60-79) 120/70 (10/19 0511) SpO2:  [96 %-99 %] 99 % (10/19 0511) Weight:  [247 lb 6.4 oz (112.2 kg)] 247 lb 6.4 oz (112.2 kg) (10/19 0511)  Pre op weight 115 kg Current Weight  03/18/16 247 lb 6.4 oz (112.2 kg)      Intake/Output from previous day: 10/18 0701 - 10/19 0700 In: 600 [P.O.:600] Out: 310 [Chest Tube:310]   Physical Exam:  Cardiovascular: RRR Pulmonary: Clear to auscultation on left and slightly diminished at right base Abdomen: Soft, non tender, bowel sounds present. Extremities: Trace lower extremity edema. Wounds: Clean and dry.  No erythema or signs of infection.  Lab Results: CBC:  Recent Labs  03/16/16 0439  WBC 7.5  HGB 11.2*  HCT 32.0*  PLT 170   BMET:   Recent Labs  03/16/16 0439 03/17/16 0312  NA 136 137  K 3.8 4.4  CL 103 103  CO2 26 25  GLUCOSE 120* 103*  BUN 26* 24*  CREATININE 1.09 1.12  CALCIUM 8.2* 8.8*    PT/INR:  Lab Results  Component Value Date   INR 1.22 03/12/2016   INR 1.05 03/11/2016   INR 0.95 01/12/2016   ABG:  INR: Will add last result for INR, ABG once components are confirmed Will add last 4 CBG results once components are confirmed  Assessment/Plan:  1. CV - SR in the 80's. On Amiodarone 200 mg daily, Lisinopril 5 mg daily, and Toprol XL 25 mg daily. Per Dr. Roxy Manns, will restart  Xarelto later today. 2.  Pulmonary - On room air. Chest tube with 310 cc last 24 hours (120 cc last 12 hours). Will remove chest tubes.  Check CXR in am. Encourage incentive spirometer. 3. Chronic diastolic CHF- On Lasix 40 mg bid. Will decrease to daily 4.  Acute blood loss anemia - Last H and H stable at 11.2 and 32 5. DM-CBGs 136/124/113. On Invokana 300 mg daily and Glimeperide 4 mg daily. Pre op HGA1C 7.5 6. Patient states Protonix does not work. Given Maalox yesterday. His wife is to bring in his [place] for him to take today. 7. Hopefully, discharge in am.   ZIMMERMAN,DONIELLE MPA-C 03/18/2016,7:27 AM    I have seen and examined the patient and agree with the assessment and plan as outlined.  [name], MD 03/18/2016 8:26 PM

## 2016-03-19 ENCOUNTER — Inpatient Hospital Stay (HOSPITAL_COMMUNITY): Payer: 59

## 2016-03-19 LAB — GLUCOSE, CAPILLARY
GLUCOSE-CAPILLARY: 114 mg/dL — AB (ref 65–99)
GLUCOSE-CAPILLARY: 161 mg/dL — AB (ref 65–99)

## 2016-03-19 MED ORDER — AMIODARONE HCL 200 MG PO TABS: 200.0000 mg | ORAL_TABLET | Freq: Every day | ORAL | 0 refills | Status: DC

## 2016-03-19 MED ORDER — OXYCODONE HCL 5 MG PO TABS
5.0000 mg | ORAL_TABLET | ORAL | 0 refills | Status: DC | PRN
Start: 2016-03-19 — End: 2016-05-03

## 2016-03-19 MED ORDER — METOPROLOL SUCCINATE ER 25 MG PO TB24: 25.0000 mg | ORAL_TABLET | Freq: Every day | ORAL | 1 refills | Status: DC

## 2016-03-19 MED ORDER — ASPIRIN EC 81 MG PO TBEC: 325.0000 mg | DELAYED_RELEASE_TABLET | Freq: Every day | ORAL | Status: DC

## 2016-03-19 MED ORDER — FUROSEMIDE 40 MG PO TABS: 40.0000 mg | ORAL_TABLET | Freq: Every day | ORAL | 0 refills | Status: DC

## 2016-03-19 MED ORDER — POTASSIUM CHLORIDE CRYS ER 20 MEQ PO TBCR: 20.0000 meq | EXTENDED_RELEASE_TABLET | Freq: Every day | ORAL | 0 refills | Status: DC

## 2016-03-19 NOTE — Progress Notes (Signed)
Patient called this RN to change the dressing on his removed chest tube site. This RN noted moderate serosanguinous fluid oozing from the incision, petroleum gauge, 4X4 drain sponge and tape applied, will continue to monitor

## 2016-03-19 NOTE — Progress Notes (Addendum)
      [address]       [place] [address]             [phone]        7 Days Post-Op Procedure(s) (LRB): MINIMALLY INVASIVE MAZE PROCEDURE (N/A) TRANSESOPHAGEAL ECHOCARDIOGRAM (TEE) (N/A) CLIPPING OF ATRIAL APPENDAGE (Left) PATENT FORAMEN OVALE CLOSURE TIMES 2  Subjective: Patient has drainage but states it is slowing down.  Objective: Vital signs in last 24 hours: Temp:  [98.4 F (36.9 C)-98.7 F (37.1 C)] 98.7 F (37.1 C) (10/20 0500) Pulse Rate:  [83-94] 94 (10/20 0500) Cardiac Rhythm: Normal sinus rhythm;Bundle branch block (10/19 1900) Resp:  [18-20] 18 (10/20 0500) BP: (119-138)/(67-76) 123/71 (10/20 0500) SpO2:  [95 %-98 %] 95 % (10/20 0500) Weight:  [247 lb 4.8 oz (112.2 kg)] 247 lb 4.8 oz (112.2 kg) (10/20 0500)  Pre op weight 115 kg Current Weight  03/19/16 247 lb 4.8 oz (112.2 kg)      Intake/Output from previous day: No intake/output data recorded.   Physical Exam:  Cardiovascular: RRR Pulmonary: Clear to auscultation on left and slightly diminished at right base Abdomen: Soft, non tender, bowel sounds present. Extremities: No lower extremity edema. Wound: Clean and dry.  No erythema or signs of infection. 2 chest tube sites with serous drainage.  Lab Results: CBC: No results for input(s): WBC, HGB, HCT, PLT in the last 72 hours. BMET:   Recent Labs  03/17/16 0312  NA 137  K 4.4  CL 103  CO2 25  GLUCOSE 103*  BUN 24*  CREATININE 1.12  CALCIUM 8.8*    PT/INR:  Lab Results  Component Value Date   INR 1.22 03/12/2016   INR 1.05 03/11/2016   INR 0.95 01/12/2016   ABG:  INR: Will add last result for INR, ABG once components are confirmed Will add last 4 CBG results once components are confirmed  Assessment/Plan:  1. CV - SR in the 80's. On Amiodarone 200 mg daily, Lisinopril 5 mg daily, and Toprol XL 25 mg daily, and Xarelto 20 mg daily. 2.  Pulmonary - On room air. Chest tubes removed yesterday. CXR  appears to show stable,small right apical pneumothorax.  Encourage incentive spirometer. 3. Chronic diastolic CHF- On Lasix 40 mg daily. Will discuss with Dr. Roxy Manns how long to continue post op. 4.  Acute blood loss anemia - Last H and H stable at 11.2 and 32 5. DM-CBGs 104/116/114. On Invokana 300 mg daily and Glimeperide 4 mg daily. Pre op HGA1C 7.5 6. Hopefully discharge later today   ZIMMERMAN,DONIELLE MPA-C 03/19/2016,7:24 AM    I have seen and examined the patient and agree with the assessment and plan as outlined.  D/C home today.  [name], MD 03/19/2016 9:22 AM

## 2016-03-22 ENCOUNTER — Telehealth: Payer: Self-pay | Admitting: *Deleted

## 2016-03-22 NOTE — Telephone Encounter (Signed)
Unable to reach patient at time of TCM Call.  Left message for patient to return call when available.  Pt due for 30 min hospital follow-up appt w/ PCP on or before 04/02/16.

## 2016-03-23 NOTE — Telephone Encounter (Signed)
Transition Care Management Follow-up Telephone Call  Per Discharge Summary: Admission Diagnoses: Long standing persistent atrial fibrillation  Active Diagnoses:  1. Patent foramen ovale 2.  DM (diabetes mellitus, type 2) (HCC) 3. Obesity 4. [name] (obstructive sleep apnea) 5. Atrial flutter ([place]) 6. Depression 7. GERD (gastroesophageal reflux disease) 8. Hyperlipidemia 9. Hypothyroidism 10. Gout 11. ABL Anemia  Procedure (s):   Minimally-Invasive Maze Procedure Complete bilateral atrial lesion set using cryothermy and bipolar radiofrequency ablation Clippingof Left Atrial Appendage (Atriclip Pro245 left atrial clip, size 55mm)   Closure of Patent Foramen Ovale by Dr. Roxy Manns on 03/12/2016.  --   How have you been since you were released from the hospital? "Better everyday."   Do you understand why you were in the hospital? yes   Do you understand the discharge instructions? yes   Where were you discharged to? Home   Items Reviewed:  Medications reviewed: yes  Allergies reviewed: yes  Dietary changes reviewed: no, none made  Referrals reviewed: no, none made   Functional Questionnaire:   Activities of Daily Living (ADLs):   He states they are independent in the following: ambulation, bathing and hygiene, feeding, continence, grooming, toileting and dressing States they require assistance with the following: none   Any transportation issues/concerns?: no   Any patient concerns? no   Confirmed importance and date/time of follow-up visits scheduled yes  Provider Appointment booked with Dr. [name] 03/29/16 @ 11:30am  Confirmed with patient if condition begins to worsen call PCP or go to the ER.  Patient was given the office number and encouraged to call back with question or concerns.  : yes

## 2016-03-25 ENCOUNTER — Encounter: Payer: 59 | Admitting: Dietician

## 2016-03-29 ENCOUNTER — Ambulatory Visit (INDEPENDENT_AMBULATORY_CARE_PROVIDER_SITE_OTHER): Payer: 59 | Admitting: Internal Medicine

## 2016-03-29 ENCOUNTER — Encounter: Payer: Self-pay | Admitting: Internal Medicine

## 2016-03-29 ENCOUNTER — Other Ambulatory Visit: Payer: Self-pay | Admitting: Thoracic Surgery (Cardiothoracic Vascular Surgery)

## 2016-03-29 VITALS — BP 112/60 | HR 86 | Temp 98.2°F | Ht 74.0 in | Wt 244.0 lb

## 2016-03-29 DIAGNOSIS — I483 Typical atrial flutter: Secondary | ICD-10-CM

## 2016-03-29 DIAGNOSIS — G4700 Insomnia, unspecified: Secondary | ICD-10-CM | POA: Diagnosis not present

## 2016-03-29 DIAGNOSIS — E039 Hypothyroidism, unspecified: Secondary | ICD-10-CM | POA: Diagnosis not present

## 2016-03-29 MED ORDER — DEXLANSOPRAZOLE 60 MG PO CPDR: 60.0000 mg | DELAYED_RELEASE_CAPSULE | Freq: Every day | ORAL | 1 refills | Status: DC

## 2016-03-29 MED ORDER — FENOFIBRATE 160 MG PO TABS: 160.0000 mg | ORAL_TABLET | Freq: Every day | ORAL | 1 refills | Status: DC

## 2016-03-29 MED ORDER — FAMOTIDINE 20 MG PO TABS: 20.0000 mg | ORAL_TABLET | Freq: Every day | ORAL | 1 refills | Status: DC

## 2016-03-29 MED ORDER — CLONAZEPAM 0.5 MG PO TABS
0.5000 mg | ORAL_TABLET | Freq: Every evening | ORAL | 0 refills | Status: DC | PRN
Start: 2016-03-29 — End: 2016-05-03

## 2016-03-29 MED ORDER — LEVOTHYROXINE SODIUM 200 MCG PO TABS: 200.0000 ug | ORAL_TABLET | Freq: Every day | ORAL | 0 refills | Status: DC

## 2016-03-29 MED ORDER — FLUTICASONE PROPIONATE 50 MCG/ACT NA SUSP: 2.0000 | Freq: Every day | NASAL | 3 refills | Status: DC

## 2016-03-29 MED ORDER — ALLOPURINOL 300 MG PO TABS: 300.0000 mg | ORAL_TABLET | Freq: Every day | ORAL | 1 refills | Status: DC

## 2016-03-29 NOTE — Patient Instructions (Addendum)
GO TO THE LAB : Get the blood work     GO TO THE FRONT DESK Schedule your next appointment for a  checkup in 6 months  Please review carefully your discharge papers from the hospital and the take medications according to that list  Discuss your  medication list with the nurse at the cardiac surgery office  Try clonazepam at bedtime to help you with difficulty sleeping. You can take 1 or 1-1/2 tablet at bedtime.

## 2016-03-29 NOTE — Progress Notes (Signed)
Pre visit review using our clinic review tool, if applicable. No additional management support is needed unless otherwise documented below in the visit note. 

## 2016-03-29 NOTE — Progress Notes (Signed)
Subjective:    Patient ID: Colin Wood, male    DOB: [birth year], 55 y.o.   MRN: [id number]  DOS:  03/29/2016 Type of visit - description : TCM Interval history:  Admitted 03/12/2016 for one week, underwent a minimally invasive Maze procedure, PFO closure and left atrial appendage clipping. He is here because he has some questions regards the medications he is taking: Takes Pacerone 200 mg twice a day, also is not taking Lasix or potassium supplements. Otherwise good medication compliance, surgical wound pain has decrease, taking Tylenol and occasionally oxycodone. Having a difficult time sleeping, falls asleep okay but staying sleep is very difficult. Feeling "cold" all the time. Needs multiple refills    Review of Systems Denies difficulty breathing, lower extremity edema or palpitations No nausea, vomiting, diarrhea  Past Medical History:  Diagnosis Date  . Atrial flutter ([place] [facility])    typical appearing; s/p ablation 12-2012  . Depression    used to see psych, on no meds as of 03/2009  . Diabetes mellitus    TYPE 2  . Dysrhythmia   . GERD (gastroesophageal reflux disease)   . Gout   . Hyperlipidemia   . Hypothyroidism   . Persistent atrial fibrillation ([place])    PVI 12/2012  . Rhinitis    vasomotor  . S/P Minimally invasive maze operation for atrial fibrillation 03/12/2016   Complete bilateral atrial lesion set using cryothermy and bipolar radiofrequency ablation with clipping of LA appendage via right mini thoracotomy approach  . S/P patent foramen ovale closure 03/12/2016  . Sleep apnea    mild, now treated with CPAP since ~9-12    Past Surgical History:  Procedure Laterality Date  . ABLATION OF DYSRHYTHMIC FOCUS  01/05/2013   PVI and flutter ablation by [name]  . ATRIAL FIBRILLATION ABLATION N/A 01/05/2013   Procedure: ATRIAL FIBRILLATION ABLATION;  Surgeon: Lammert Grayer, MD;  Location: [facility] CATH LAB;  Service: Cardiovascular;  Laterality: N/A;  . CARDIAC  CATHETERIZATION N/A 01/12/2016   Procedure: Right/Left Heart Cath and Coronary Angiography;  Surgeon: [name], MD;  Location: [facility] CV LAB;  Service: Cardiovascular;  Laterality: N/A;  . CARDIOVERSION  03/01/11  . CLIPPING OF ATRIAL APPENDAGE Left 03/12/2016   Procedure: CLIPPING OF ATRIAL APPENDAGE;  Surgeon: [name], MD;  Location: [facility];  Service: Open Heart Surgery;  Laterality: Left;  . MINIMALLY INVASIVE MAZE PROCEDURE N/A 03/12/2016   Procedure: MINIMALLY INVASIVE MAZE PROCEDURE;  Surgeon: [name], MD;  Location: [facility];  Service: Open Heart Surgery;  Laterality: N/A;  . NASAL SINUS SURGERY    . TEE WITHOUT CARDIOVERSION N/A 01/04/2013   Procedure: TRANSESOPHAGEAL ECHOCARDIOGRAM (TEE);  Surgeon: [name], MD;  Location: [facility] ENDOSCOPY;  Service: Cardiovascular;  Laterality: N/A;  . TEE WITHOUT CARDIOVERSION N/A 03/12/2016   Procedure: TRANSESOPHAGEAL ECHOCARDIOGRAM (TEE);  Surgeon: [name], MD;  Location: [facility];  Service: Open Heart Surgery;  Laterality: N/A;  . TONSILLECTOMY    . VASECTOMY      Social History   Social History  . Marital status: Married    Spouse name: N/A  . Number of children: 2  . Years of education: N/A   Occupational History  . works at [employer] Topics  . Smoking status: Never Smoker  . Smokeless tobacco: Never Used  . Alcohol use No  . Drug use: No  . Sexual activity: Not on file   Other Topics Concern  .  Not on file   Social History Narrative   Pt lives in [place] with spouse and children 61 and 9 y/o.  Works in Engineer, technical sales at [employer].        Medication List       Accurate as of 03/29/16 12:06 PM. Always use your most recent med list.          allopurinol 300 MG tablet Commonly known as:  ZYLOPRIM Take 1 tablet (300 mg total) by mouth daily.   amiodarone 200 MG tablet Commonly known as:  PACERONE Take 1 tablet (200 mg total) by mouth daily.   aspirin EC 81 MG tablet Take 4  tablets (325 mg total) by mouth daily.   atorvastatin 10 MG tablet Commonly known as:  LIPITOR Take 1 tablet (10 mg total) by mouth daily.   canagliflozin 300 MG Tabs tablet Commonly known as:  INVOKANA Take 1 tablet (300 mg total) by mouth daily before breakfast.   dexlansoprazole 60 MG capsule Commonly known as:  DEXILANT Take 1 capsule (60 mg total) by mouth daily.   famotidine 20 MG tablet Commonly known as:  PEPCID Take 1 tablet (20 mg total) by mouth at bedtime.   fenofibrate 160 MG tablet Take 1 tablet (160 mg total) by mouth daily.   fluticasone 50 MCG/ACT nasal spray Commonly known as:  FLONASE Place 2 sprays into both nostrils daily.   furosemide 40 MG tablet Commonly known as:  LASIX Take 1 tablet (40 mg total) by mouth daily. For 6 days then stop.   glimepiride 4 MG tablet Commonly known as:  AMARYL Take 1 tablet (4 mg total) by mouth daily with breakfast.   levothyroxine 200 MCG tablet Commonly known as:  SYNTHROID, LEVOTHROID Take 1 tablet (200 mcg total) by mouth daily before breakfast.   lisinopril 5 MG tablet Commonly known as:  PRINIVIL,ZESTRIL Take 1 tablet (5 mg total) by mouth daily.   metFORMIN 1000 MG tablet Commonly known as:  GLUCOPHAGE Take 1 tablet (1,000 mg total) by mouth 2 (two) times daily with a meal.   metoprolol succinate 25 MG 24 hr tablet Commonly known as:  TOPROL-XL Take 1 tablet (25 mg total) by mouth daily.   omega-3 acid ethyl esters 1 g capsule Commonly known as:  LOVAZA Take 2 capsules by mouth  two times daily  (4 gram total daily)   oxyCODONE 5 MG immediate release tablet Commonly known as:  Oxy IR/ROXICODONE Take 1 tablet (5 mg total) by mouth every 4 (four) hours as needed for severe pain.   potassium chloride SA 20 MEQ tablet Commonly known as:  K-DUR,KLOR-CON Take 1 tablet (20 mEq total) by mouth daily. For 6 days then stop.   rivaroxaban 20 MG Tabs tablet Commonly known as:  XARELTO Take 1 tablet by mouth  at  bedtime          Objective:   Physical Exam BP 112/60 (BP Location: Left Arm, Patient Position: Sitting, Cuff Size: Large)   Pulse 86   Temp 98.2 F (36.8 C) (Oral)   Ht 6\' 2"  (1.88 m)   Wt 244 lb (110.7 kg)   SpO2 97%   BMI 31.33 kg/m  General:   Well developed, well nourished . NAD.  HEENT:  Normocephalic . Face symmetric, atraumatic Lungs:  CTA B Normal respiratory effort, no intercostal retractions, no accessory muscle use. Heart: RRR,  no murmur.  No pretibial edema bilaterally  Skin: Not pale. Not jaundice Neurologic:  alert & oriented X3.  Speech normal, gait appropriate for age and unassisted Psych--  Cognition and judgment appear intact.  Cooperative with normal attention span and concentration.  Behavior appropriate. No anxious or depressed appearing.      Assessment & Plan:   Assessment > DM Dr. Dwyane Dee since 07-2014 Hyperlipidemia Hypothyroidism A flutter, ablation 12-2012, then  Paroxysmal Afib, s/p Maze-PFO closure-Atriclip 02-2016 GERD Depression Gout [name], CPAP since 2012  PLAN A flutter: s/p Maze procedure, PFO closure. Seems to be doing well but we need to clarify his medications: Taking Pacerone twice a day, d/c summary states  once daily. Not taking Lasix or potassium, d/c summary states cont with them Plan: Check a BMP-CBC. Continue doing what he is doing, he needs to review his d/c summary paper and plans to talk w/ the nurse at the cardiac surgery office (has an appointment for stitches removal tomorrow). Hypothyroidism: Reports good med compliance, check aTSH Feeling "cold"-- checking labs . Insomnia: Previously Ambien did not help, trial with clonazepam. Needs multiple refills. RTC 6 months

## 2016-03-30 ENCOUNTER — Telehealth: Payer: Self-pay | Admitting: Internal Medicine

## 2016-03-30 ENCOUNTER — Encounter (INDEPENDENT_AMBULATORY_CARE_PROVIDER_SITE_OTHER): Payer: Self-pay

## 2016-03-30 DIAGNOSIS — Z4802 Encounter for removal of sutures: Secondary | ICD-10-CM

## 2016-03-30 DIAGNOSIS — Z9889 Other specified postprocedural states: Secondary | ICD-10-CM

## 2016-03-30 DIAGNOSIS — Z8679 Personal history of other diseases of the circulatory system: Secondary | ICD-10-CM

## 2016-03-30 LAB — BASIC METABOLIC PANEL
BUN: 18 mg/dL (ref 4–21)
CREATININE: 1 mg/dL (ref 0.6–1.3)
GLUCOSE: 98 mg/dL
POTASSIUM: 5 mmol/L (ref 3.4–5.3)
Sodium: 98 mmol/L — AB (ref 137–147)

## 2016-03-30 LAB — CBC AND DIFFERENTIAL
HCT: 33 % — AB (ref 41–53)
HEMOGLOBIN: 11 g/dL — AB (ref 13.5–17.5)
Neutrophils Absolute: 5 /uL
Platelets: 542 10*3/uL — AB (ref 150–399)
WBC: 7.6 10*3/mL

## 2016-03-30 LAB — TSH: TSH: 2.43 u[IU]/mL (ref 0.41–5.90)

## 2016-03-30 NOTE — Telephone Encounter (Signed)
Please check on the patient, was he able to clarify medication list with the nurse at surgery?.(see last OV)

## 2016-03-30 NOTE — Assessment & Plan Note (Signed)
A flutter: s/p Maze procedure, PFO closure. Seems to be doing well but we need to clarify his medications: Taking Pacerone twice a day, d/c summary states  once daily. Not taking Lasix or potassium, d/c summary states cont with them Plan: Check a BMP-CBC. Continue doing what he is doing, he needs to review his d/c summary paper and plans to talk w/ the nurse at the cardiac surgery office (has an appointment for stitches removal tomorrow). Hypothyroidism: Reports good med compliance, check aTSH Feeling "cold"-- checking labs . Insomnia: Previously Ambien did not help, trial with clonazepam. Needs multiple refills. RTC 6 months

## 2016-03-30 NOTE — Telephone Encounter (Signed)
LMOM informing Pt to return call.  

## 2016-03-31 ENCOUNTER — Encounter: Payer: Self-pay | Admitting: Internal Medicine

## 2016-03-31 ENCOUNTER — Ambulatory Visit (INDEPENDENT_AMBULATORY_CARE_PROVIDER_SITE_OTHER): Payer: 59 | Admitting: Internal Medicine

## 2016-03-31 ENCOUNTER — Telehealth: Payer: Self-pay | Admitting: Endocrinology

## 2016-03-31 VITALS — BP 114/68 | HR 84 | Ht 74.0 in | Wt 245.0 lb

## 2016-03-31 DIAGNOSIS — I481 Persistent atrial fibrillation: Secondary | ICD-10-CM | POA: Diagnosis not present

## 2016-03-31 DIAGNOSIS — I4819 Other persistent atrial fibrillation: Secondary | ICD-10-CM

## 2016-03-31 MED ORDER — AMIODARONE HCL 200 MG PO TABS: 200.0000 mg | ORAL_TABLET | Freq: Every day | ORAL | 3 refills | Status: DC

## 2016-03-31 MED ORDER — METOPROLOL SUCCINATE ER 25 MG PO TB24: 25.0000 mg | ORAL_TABLET | Freq: Every day | ORAL | 3 refills | Status: DC

## 2016-03-31 NOTE — Telephone Encounter (Signed)
Labs abstracted and sent for scanning. LMOM informing Pt of lab results and to have CBC w/ diff completed in 4 weeks to recheck platelet count.

## 2016-03-31 NOTE — Telephone Encounter (Signed)
Labs 03/30/2016: CBC: Hemoglobin 11, platelets 542 (likely reactive) Creatinine 1.0. Sodium 142. Potassium 5.0 (normal) TSH 2.4.  Advise patient: Labs are satisfactory. Platelet count is slightly elevated, recommend to be recheck a CBC n 4 weeks. Fax results to me

## 2016-03-31 NOTE — Patient Instructions (Signed)
Medication Instructions:  Your physician recommends that you continue on your current medications as directed. Please refer to the Current Medication list given to you today.   Labwork: None ordered   Testing/Procedures: None ordered   Follow-Up: Your physician recommends that you schedule a follow-up appointment in: 6 weeks with Dr Allred   Any Other Special Instructions Will Be Listed Below (If Applicable).     If you need a refill on your cardiac medications before your next appointment, please call your pharmacy.   

## 2016-03-31 NOTE — Telephone Encounter (Signed)
Patient need a one touch Delica,lansets  one touch ultra test strips. Send to  [place], [place] [phone] (Phone) [phone] (Fax)

## 2016-03-31 NOTE — Telephone Encounter (Signed)
Relation to [id number] Call back number:[phone]   Reason for call:  Patient wanted to inform you medication concern regarding dosage has been clarified and labs were taken yesterday.

## 2016-03-31 NOTE — Telephone Encounter (Signed)
Labs received and have been forwarded to PCP.

## 2016-03-31 NOTE — Progress Notes (Signed)
Electrophysiology Office Note   Date:  03/31/2016   ID:  Colin, Wood Oct 01, 1960, MRN [id number]  PCP:  [name], MD   Primary Electrophysiologist: Dedeaux Grayer, MD    Chief Complaint  Patient presents with  . Atrial Fibrillation     History of Present Illness: Colin Wood is a 55 y.o. male who presents today for electrophysiology evaluation.   Recovering slowly from recent MAZE.  Denies SOB.  No further afib.  Primary concern is with difficulty sleeping.  Energy is slowly improving.  Today, he denies symptoms of chest pain,  orthopnea, PND, lower extremity edema, claudication, dizziness, presyncope, syncope, bleeding, or neurologic sequela. The patient is tolerating medications without difficulties and is otherwise without complaint today.    Past Medical History:  Diagnosis Date  . Atrial flutter ([place])    typical appearing; s/p ablation 12-2012  . Depression    used to see psych, on no meds as of 03/2009  . Diabetes mellitus    TYPE 2  . Dysrhythmia   . GERD (gastroesophageal reflux disease)   . Gout   . Hyperlipidemia   . Hypothyroidism   . Persistent atrial fibrillation ([place])    PVI 12/2012  . Rhinitis    vasomotor  . S/P Minimally invasive maze operation for atrial fibrillation 03/12/2016   Complete bilateral atrial lesion set using cryothermy and bipolar radiofrequency ablation with clipping of LA appendage via right mini thoracotomy approach  . S/P patent foramen ovale closure 03/12/2016  . Sleep apnea    mild, now treated with CPAP since ~9-12   Past Surgical History:  Procedure Laterality Date  . ABLATION OF DYSRHYTHMIC FOCUS  01/05/2013   PVI and flutter ablation by [name]  . ATRIAL FIBRILLATION ABLATION N/A 01/05/2013   Procedure: ATRIAL FIBRILLATION ABLATION;  Surgeon: Passero Grayer, MD;  Location[facility] CATH LAB;  Service: Cardiovascular;  Laterality: N/A;  . CARDIAC CATHETERIZATION N/A 01/12/2016   Procedure: Right/Left Heart Cath and  Coronary Angiography;  Surgeon: [name], MD;  Location: [place] CV LAB;  Service: Cardiovascular;  Laterality: N/A;  . CARDIOVERSION  03/01/11  . CLIPPING OF ATRIAL APPENDAGE Left 03/12/2016   Procedure: CLIPPING OF ATRIAL APPENDAGE;  Surgeon: [name], MD;  Location: [place];  Service: Open Heart Surgery;  Laterality: Left;  . MINIMALLY INVASIVE MAZE PROCEDURE N/A 03/12/2016   Procedure: MINIMALLY INVASIVE MAZE PROCEDURE;  Surgeon: [name], MD;  Location: [place];  Service: Open Heart Surgery;  Laterality: N/A;  . NASAL SINUS SURGERY    . TEE WITHOUT CARDIOVERSION N/A 01/04/2013   Procedure: TRANSESOPHAGEAL ECHOCARDIOGRAM (TEE);  Surgeon: [name], MD;  Location: [facility] ENDOSCOPY;  Service: Cardiovascular;  Laterality: N/A;  . TEE WITHOUT CARDIOVERSION N/A 03/12/2016   Procedure: TRANSESOPHAGEAL ECHOCARDIOGRAM (TEE);  Surgeon: [name], MD;  Location: [place];  Service: Open Heart Surgery;  Laterality: N/A;  . TONSILLECTOMY    . VASECTOMY       Current Outpatient Prescriptions  Medication Sig Dispense Refill  . allopurinol (ZYLOPRIM) 300 MG tablet Take 1 tablet (300 mg total) by mouth daily. 90 tablet 1  . amiodarone (PACERONE) 200 MG tablet Take 1 tablet (200 mg total) by mouth daily. 30 tablet 0  . Ascorbic Acid (VITAMIN C PO) Take 2 tablets by mouth 2 (two) times daily.    [name] aspirin EC 81 MG tablet Take 4 tablets (325 mg total) by mouth daily.    [name] atorvastatin (LIPITOR) 10  MG tablet Take 1 tablet (10 mg total) by mouth daily. 90 tablet 1  . canagliflozin (INVOKANA) 300 MG TABS tablet Take 1 tablet (300 mg total) by mouth daily before breakfast. 90 tablet 1  . clonazePAM (KLONOPIN) 0.5 MG tablet Take 1-1.5 tablets (0.5-0.75 mg total) by mouth at bedtime as needed (insomnia). 30 tablet 0  . dexlansoprazole (DEXILANT) 60 MG capsule Take 1 capsule (60 mg total) by mouth daily. 90 capsule 1  . docusate sodium (COLACE) 100 MG capsule Take 100 mg by mouth daily.    .  famotidine (PEPCID) 20 MG tablet Take 1 tablet (20 mg total) by mouth at bedtime. 90 tablet 1  . fenofibrate 160 MG tablet Take 1 tablet (160 mg total) by mouth daily. 90 tablet 1  . fluticasone (FLONASE) 50 MCG/ACT nasal spray Place 2 sprays into both nostrils daily. 48 g 3  . glimepiride (AMARYL) 4 MG tablet Take 1 tablet (4 mg total) by mouth daily with breakfast. 90 tablet 1  . levothyroxine (SYNTHROID, LEVOTHROID) 200 MCG tablet Take 1 tablet (200 mcg total) by mouth daily before breakfast. 90 tablet 0  . lisinopril (PRINIVIL,ZESTRIL) 5 MG tablet Take 1 tablet (5 mg total) by mouth daily. 90 tablet 3  . metFORMIN (GLUCOPHAGE) 1000 MG tablet Take 1 tablet (1,000 mg total) by mouth 2 (two) times daily with a meal. 180 tablet 1  . metoprolol succinate (TOPROL-XL) 25 MG 24 hr tablet Take 1 tablet (25 mg total) by mouth daily. 30 tablet 1  . omega-3 acid ethyl esters (LOVAZA) 1 g capsule Take 2 capsules by mouth  two times daily  (4 gram total daily) 360 capsule 0  . rivaroxaban (XARELTO) 20 MG TABS tablet Take 1 tablet by mouth at  bedtime 90 tablet 3  . oxyCODONE (OXY IR/ROXICODONE) 5 MG immediate release tablet Take 1 tablet (5 mg total) by mouth every 4 (four) hours as needed for severe pain. (Patient not taking: Reported on 03/31/2016) 28 tablet 0   No current facility-administered medications for this visit.     Allergies:   No known allergies   Social History:  The patient  reports that he has never smoked. He has never used smokeless tobacco. He reports that he does not drink alcohol or use drugs.   Family History:  The patient's  family history includes Allergies in his daughter; Asthma in his mother; Colon cancer in his paternal grandfather; Diabetes in his father; Hypertension in his brother; Melanoma in his brother; Throat cancer in his maternal uncle.    ROS:  Please see the history of present illness.   All other systems are reviewed and negative.    PHYSICAL EXAM: VS:  BP  114/68   Pulse 84   Ht 6\' 2"  (1.88 m)   Wt 245 lb (111.1 kg)   BMI 31.46 kg/m  , BMI Body mass index is 31.46 kg/m. GEN: Well nourished, well developed, in no acute distress  HEENT: normal  Neck: no JVD, carotid bruits, or masses Cardiac: RRR; no murmurs, rubs, or gallops,no edema  Respiratory:  clear to auscultation bilaterally, normal work of breathing GI: soft, nontender, nondistended, + BS MS: no deformity or atrophy  Skin: incisions s/p recent surgery are slowly healing.  One small inframammary incision will require packing daily per Dr [name] staff Neuro:  Strength and sensation are intact Psych: euthymic mood, full affect  EKG:  EKG is ordered today. The ekg ordered today shows sinus rhythm 84 bpm, imcomplete RBBB  Recent Labs: 04/08/2015: TSH 8.050 03/11/2016: ALT 43 03/13/2016: Magnesium 2.1 03/16/2016: Hemoglobin 11.2; Platelets 170 03/17/2016: BUN 24; Creatinine, Ser 1.12; Potassium 4.4; Sodium 137    Lipid Panel     Component Value Date/Time   CHOL 179 03/19/2015   TRIG 238 (A) 03/19/2015   HDL 38 03/19/2015   LDLCALC 93 03/19/2015     Wt Readings from Last 3 Encounters:  03/31/16 245 lb (111.1 kg)  03/29/16 244 lb (110.7 kg)  03/19/16 247 lb 4.8 oz (112.2 kg)     ASSESSMENT AND PLAN:  Assessment and Plan: 1. Persistent AFIb Doing well s/p MAZE.  Maintaining sinus rhythm with amiodarone. Dr Roxy Manns to wean Amiodarone as able. Continue xarelto Stop ASA when ok with Dr Roxy Manns  2 Lifestyle risk factors for recurrent afib Weight loss, exercise program and consistent use of CPAP encouraged.  3. [name] stable  4. Chronic systolic dysfunction Likely tachycardia mediated Consider repeating echo in 3 months  Follow-up with Dr Roxy Manns as scheduled Return to see me in 6 weeks  Signed, Gilkey Grayer, MD  03/31/2016 9:46 AM     [facility] [address] [place] [address] [phone] (office) [phone] (fax)

## 2016-04-01 ENCOUNTER — Other Ambulatory Visit: Payer: Self-pay

## 2016-04-01 LAB — HM DIABETES EYE EXAM

## 2016-04-02 ENCOUNTER — Other Ambulatory Visit: Payer: Self-pay | Admitting: Thoracic Surgery (Cardiothoracic Vascular Surgery)

## 2016-04-02 DIAGNOSIS — Z8679 Personal history of other diseases of the circulatory system: Secondary | ICD-10-CM

## 2016-04-02 DIAGNOSIS — Z9889 Other specified postprocedural states: Principal | ICD-10-CM

## 2016-04-02 MED ORDER — GLUCOSE BLOOD VI STRP: ORAL_STRIP | 2 refills | Status: DC

## 2016-04-02 MED ORDER — ONETOUCH DELICA LANCETS 33G MISC: 2 refills | Status: DC

## 2016-04-02 NOTE — Telephone Encounter (Signed)
Refills submitted.  

## 2016-04-05 ENCOUNTER — Ambulatory Visit (INDEPENDENT_AMBULATORY_CARE_PROVIDER_SITE_OTHER): Payer: Self-pay | Admitting: Thoracic Surgery (Cardiothoracic Vascular Surgery)

## 2016-04-05 ENCOUNTER — Ambulatory Visit
Admission: RE | Admit: 2016-04-05 | Discharge: 2016-04-05 | Disposition: A | Discharge status: Home / Self Care | Payer: 59 | Source: Ambulatory Visit | Attending: Thoracic Surgery (Cardiothoracic Vascular Surgery) | Admitting: Thoracic Surgery (Cardiothoracic Vascular Surgery)

## 2016-04-05 ENCOUNTER — Encounter: Payer: Self-pay | Admitting: Thoracic Surgery (Cardiothoracic Vascular Surgery)

## 2016-04-05 VITALS — BP 105/68 | HR 86 | Resp 16 | Ht 74.0 in | Wt 242.2 lb

## 2016-04-05 DIAGNOSIS — Z8679 Personal history of other diseases of the circulatory system: Secondary | ICD-10-CM

## 2016-04-05 DIAGNOSIS — Q211 Atrial septal defect: Secondary | ICD-10-CM

## 2016-04-05 DIAGNOSIS — Z09 Encounter for follow-up examination after completed treatment for conditions other than malignant neoplasm: Secondary | ICD-10-CM

## 2016-04-05 DIAGNOSIS — Z9889 Other specified postprocedural states: Principal | ICD-10-CM

## 2016-04-05 DIAGNOSIS — I481 Persistent atrial fibrillation: Secondary | ICD-10-CM

## 2016-04-05 DIAGNOSIS — I4819 Other persistent atrial fibrillation: Secondary | ICD-10-CM

## 2016-04-05 DIAGNOSIS — Q2112 Patent foramen ovale: Secondary | ICD-10-CM

## 2016-04-05 NOTE — Progress Notes (Signed)
[address]       [place] [address]             [phone]     CARDIOTHORACIC SURGERY OFFICE NOTE  Referring Provider is Ellwanger Grayer, MD PCP is [name], MD   HPI:  Patient is a 55 year old obese white male with history of long-standing persistent atrial fibrillation who returns to the office today for routine follow-up status post minimally invasive Maze procedure with closure of patent foramen ovale on 03/12/2016.  The patient's early postoperative recovery in the hospital was uneventful and he was discharged home on the seventh postoperative day. Since hospital discharge the patient has done well. He did have skin separation involving one of the 2 chest tube incisions because the suture was not tied tightly. He has been packing this chest tube incision with gauze on a daily basis. He was recently seen in follow-up by [name] on 03/31/2016 at which time he was maintaining sinus rhythm. He returns to our office for routine follow-up today. He reports that overall he is doing well. He has minimal soreness in his chest and he has not been taking any sort of pain relievers other than Tylenol for the past 2 weeks. He denies any shortness of breath. He does report an intermittent cough which he states predates his surgery and is related to a long-standing problem with GE reflux disease. His cough has been nonproductive and not associated with fevers. Appetite has been slowly improving but is still well below baseline. Blood sugars have been under good control. He has not been walking much. His blood pressure has been running somewhat lower than he was accustomed to prior to surgery and he just realized that he was mistakenly taking diltiazem CD despite the fact that his discharge instructions clearly said to stop taking it because of his blood pressure. He has not had any palpitations or other symptoms to suggest a recurrence of atrial fibrillation. Overall he feels well  and he is satisfied with his progress.   Current Outpatient Prescriptions  Medication Sig Dispense Refill  . allopurinol (ZYLOPRIM) 300 MG tablet Take 1 tablet (300 mg total) by mouth daily. 90 tablet 1  . amiodarone (PACERONE) 200 MG tablet Take 1 tablet (200 mg total) by mouth daily. 90 tablet 3  . aspirin EC 81 MG tablet Take 4 tablets (325 mg total) by mouth daily.    [name] atorvastatin (LIPITOR) 10 MG tablet Take 1 tablet (10 mg total) by mouth daily. 90 tablet 1  . canagliflozin (INVOKANA) 300 MG TABS tablet Take 1 tablet (300 mg total) by mouth daily before breakfast. 90 tablet 1  . clonazePAM (KLONOPIN) 0.5 MG tablet Take 1-1.5 tablets (0.5-0.75 mg total) by mouth at bedtime as needed (insomnia). 30 tablet 0  . dexlansoprazole (DEXILANT) 60 MG capsule Take 1 capsule (60 mg total) by mouth daily. 90 capsule 1  . docusate sodium (COLACE) 100 MG capsule Take 100 mg by mouth daily.    . famotidine (PEPCID) 20 MG tablet Take 1 tablet (20 mg total) by mouth at bedtime. 90 tablet 1  . fenofibrate 160 MG tablet Take 1 tablet (160 mg total) by mouth daily. 90 tablet 1  . fluticasone (FLONASE) 50 MCG/ACT nasal spray Place 2 sprays into both nostrils daily. 48 g 3  . glimepiride (AMARYL) 4 MG tablet Take 1 tablet (4 mg total) by mouth daily with breakfast. 90 tablet 1  . glucose blood (ONE TOUCH  ULTRA TEST) test strip Use to check blood sugar 3 times per day 300 each 2  . levothyroxine (SYNTHROID, LEVOTHROID) 200 MCG tablet Take 1 tablet (200 mcg total) by mouth daily before breakfast. 90 tablet 0  . lisinopril (PRINIVIL,ZESTRIL) 5 MG tablet Take 1 tablet (5 mg total) by mouth daily. 90 tablet 3  . metFORMIN (GLUCOPHAGE) 1000 MG tablet Take 1 tablet (1,000 mg total) by mouth 2 (two) times daily with a meal. 180 tablet 1  . metoprolol succinate (TOPROL-XL) 25 MG 24 hr tablet Take 1 tablet (25 mg total) by mouth daily. 90 tablet 3  . omega-3 acid ethyl esters (LOVAZA) 1 g capsule Take 2 capsules by  mouth  two times daily  (4 gram total daily) 360 capsule 0  . ONETOUCH DELICA LANCETS 99991111 MISC Use to check blood sugar 3 times per day. 300 each 2  . rivaroxaban (XARELTO) 20 MG TABS tablet Take 1 tablet by mouth at  bedtime 90 tablet 3  . Ascorbic Acid (VITAMIN C PO) Take 2 tablets by mouth 2 (two) times daily.    [name] oxyCODONE (OXY IR/ROXICODONE) 5 MG immediate release tablet Take 1 tablet (5 mg total) by mouth every 4 (four) hours as needed for severe pain. (Patient not taking: Reported on 04/05/2016) 28 tablet 0   No current facility-administered medications for this visit.       Physical Exam:   BP 105/68   Pulse 86   Resp 16   Ht 6\' 2"  (1.88 m)   Wt 242 lb 3.2 oz (109.9 kg)   SpO2 97% Comment: ON RA  BMI 31.10 kg/m   General:  Well-appearing  Chest:   Clear to auscultation with slightly diminished breath sounds right lung base but good air movement on both sides  CV:   Regular rate and rhythm  Incisions:  Clean and dry and healing nicely, open medial chest tube incision is granulating in nicely  Abdomen:  Soft nontender  Extremities:  Warm and well-perfused  Diagnostic Tests:  2 channel telemetry rhythm strip demonstrates normal sinus rhythm   CHEST  2 VIEW  COMPARISON:  03/19/2016.  FINDINGS: Asymmetric elevation right hemidiaphragm persists with probable scarring medial right base. Cardiopericardial silhouette is at upper limits of normal for size. Left atrial appendage occluder device again noted. No edema or focal airspace consolidation. The visualized bony structures of the thorax are intact.  IMPRESSION: Interval improvement in right lung aeration with interval resolution right pneumothorax.  No acute findings on the current study.   Electronically Signed   By: Misty Stanley M.D.   On: 04/05/2016 09:01   Impression:  Patient is doing well less than 4 weeks status post minimally invasive Maze procedure. He is maintaining sinus rhythm and overall  making satisfactory progress.  Plan:  I have instructed the patient to stop taking diltiazem CD as we had previously instructed him at the time of hospital discharge. I've also instructed the patient to stop taking aspirin. I recommend that he remain on amiodarone for at least another 6 weeks as long as he tolerates it. I have instructed the patient to gradually increase his physical activity as tolerated with his primary limitation remaining only that he refrain from heavy lifting or strenuous use of his arms or shoulders for at least another month. I think he may resume driving an automobile. I have encouraged him to enroll and participate in outpatient cardiac rehabilitation program. We have discussed timing with regards to return to work.  Patient will return to our office in 2 months for routine follow-up and rhythm check. We will also obtain a follow-up chest x-ray at that time.    [name]. Roxy Manns, MD 04/05/2016 9:53 AM

## 2016-04-05 NOTE — Patient Instructions (Signed)
Stop taking diltiazem and aspirin  Continue taking amiodarone for at least 6 weeks  You may continue to gradually increase your physical activity as tolerated.  Refrain from any heavy lifting or strenuous use of your arms and shoulders until at least 8 weeks from the time of your surgery, and avoid activities that cause increased pain in your chest on the side of your surgical incision.  Otherwise you may continue to increase activities without any particular limitations.  Increase the intensity and duration of physical activity gradually.  You are encouraged to enroll and participate in the outpatient cardiac rehab program beginning as soon as practical.  You may return to driving an automobile as long as you are no longer requiring oral narcotic pain relievers during the daytime.  It would be wise to start driving only short distances during the daylight and gradually increase from there as you feel comfortable.

## 2016-04-08 ENCOUNTER — Encounter: Payer: Self-pay | Admitting: Internal Medicine

## 2016-04-16 ENCOUNTER — Telehealth (HOSPITAL_COMMUNITY): Payer: Self-pay | Admitting: *Deleted

## 2016-04-16 NOTE — Telephone Encounter (Signed)
Returned call to pt from message left on yesterday.  Pt indicated that he would like to get started in cardiac rehab.  Pt given CPT code for contacting his insurance provider [phone].  Pt to confirm benefits for rehab with the diagnosis of MAZE procedure and any financial responsibilities. Pt given orientation times and class times to consider.  Pt to call back once he has talked to the insurance company. Cherre Huger, BSN

## 2016-04-27 ENCOUNTER — Other Ambulatory Visit: Payer: Self-pay | Admitting: Endocrinology

## 2016-04-28 LAB — COMPREHENSIVE METABOLIC PANEL
ALT: 22 IU/L (ref 0–44)
AST: 18 IU/L (ref 0–40)
Albumin/Globulin Ratio: 2 (ref 1.2–2.2)
Albumin: 5 g/dL (ref 3.5–5.5)
Alkaline Phosphatase: 50 IU/L (ref 39–117)
BILIRUBIN TOTAL: 0.4 mg/dL (ref 0.0–1.2)
BUN/Creatinine Ratio: 16 (ref 9–20)
BUN: 18 mg/dL (ref 6–24)
CALCIUM: 10.6 mg/dL — AB (ref 8.7–10.2)
CHLORIDE: 100 mmol/L (ref 96–106)
CO2: 24 mmol/L (ref 18–29)
Creatinine, Ser: 1.16 mg/dL (ref 0.76–1.27)
GFR calc non Af Amer: 70 mL/min/{1.73_m2} (ref >59)
GFR, EST AFRICAN AMERICAN: 81 mL/min/{1.73_m2} (ref >59)
GLUCOSE: 103 mg/dL — AB (ref 65–99)
Globulin, Total: 2.5 g/dL (ref 1.5–4.5)
Potassium: 4.6 mmol/L (ref 3.5–5.2)
Sodium: 144 mmol/L (ref 134–144)
TOTAL PROTEIN: 7.5 g/dL (ref 6.0–8.5)

## 2016-04-28 LAB — LIPID PANEL
Chol/HDL Ratio: 3.5 ratio units (ref 0.0–5.0)
Cholesterol, Total: 175 mg/dL (ref 100–199)
HDL: 50 mg/dL (ref >39)
LDL Calculated: 100 mg/dL — ABNORMAL HIGH (ref 0–99)
TRIGLYCERIDES: 123 mg/dL (ref 0–149)
VLDL CHOLESTEROL CAL: 25 mg/dL (ref 5–40)

## 2016-04-28 LAB — TSH: TSH: 0.164 u[IU]/mL — AB (ref 0.450–4.500)

## 2016-04-28 LAB — MICROALBUMIN / CREATININE URINE RATIO
CREATININE, UR: 179.6 mg/dL
Microalb/Creat Ratio: 3 mg/g creat (ref 0.0–30.0)
Microalbumin, Urine: 5.4 ug/mL

## 2016-04-28 LAB — HEMOGLOBIN A1C
ESTIMATED AVERAGE GLUCOSE: 128 mg/dL
HEMOGLOBIN A1C: 6.1 % — AB (ref 4.8–5.6)

## 2016-05-01 ENCOUNTER — Other Ambulatory Visit: Payer: Self-pay | Admitting: Endocrinology

## 2016-05-03 ENCOUNTER — Ambulatory Visit (INDEPENDENT_AMBULATORY_CARE_PROVIDER_SITE_OTHER): Payer: 59 | Admitting: Endocrinology

## 2016-05-03 VITALS — BP 126/78 | HR 96 | Temp 98.6°F | Wt 238.0 lb

## 2016-05-03 DIAGNOSIS — E038 Other specified hypothyroidism: Secondary | ICD-10-CM | POA: Diagnosis not present

## 2016-05-03 DIAGNOSIS — E782 Mixed hyperlipidemia: Secondary | ICD-10-CM | POA: Diagnosis not present

## 2016-05-03 DIAGNOSIS — E1165 Type 2 diabetes mellitus with hyperglycemia: Secondary | ICD-10-CM

## 2016-05-03 DIAGNOSIS — E063 Autoimmune thyroiditis: Secondary | ICD-10-CM

## 2016-05-03 MED ORDER — LEVOTHYROXINE SODIUM 175 MCG PO TABS: 175.0000 ug | ORAL_TABLET | Freq: Every day | ORAL | 3 refills | Status: DC

## 2016-05-03 NOTE — Patient Instructions (Addendum)
Check blood sugars on waking up  2-3x per week  Also check blood sugars about 2 hours after a meal and do this after different meals by rotation  Recommended blood sugar levels on waking up is 90-130 and about 2 hours after meal is 130-160  Please bring your blood sugar monitor to each visit, thank you  Reduce Glimeperide to 1/2 daily in am

## 2016-05-03 NOTE — Progress Notes (Signed)
Patient ID: Colin Wood, male   DOB: 1961/03/19, 55 y.o.   MRN: [id number]           Reason for Appointment: Follow-up for Type 2 Diabetes  Referring physician: Larose Kells  History of Present Illness:          Diagnosis: Type 2 diabetes mellitus, date of diagnosis: ?  2011        Past history: He was not having any symptoms at the time of diagnosis and not clear what his initial blood sugars were He was started on metformin initially and had fairly good control Subsequently with higher sugars he was given Amaryl also in addition which has been continued.  A1c was 10.6% in 2012 Subsequently in 2014 his A1c was down to 6.5 but he had a regular follow-up subsequently Because of an A1c of 8.1 in 02/2014 he was given Tradjenta in addition to the above drugs but this was not effective  He was referred here because of a high A1c of 7.9 in 07/2014  Recent history:   Oral hypoglycemic drugs the patient is taking are: metformin 1 g twice a day, Amaryl 4 mg in a.m., Invokana 300 mg daily      He has previously had difficulty controlling his diabetes because of his poor compliance with diet, exercise and glucose monitoring  Previously his A1c had been 7 or below but now it is down to 6.1  Current management, blood sugar values and problems identified:  On his last visit his Invokana was increased up to 300 mg  Also he was advised to get new test strips to check his sugar and to monitor more often  FASTING blood sugars are excellent usually and occasionally low normal  Although he does not feel hypoglycemic significantly may sometimes feel like having a snack before lunch, does not check his sugar with symptoms  He has not done any readings after lunch or supper  He is able to start walking although has had difficulty being consistent with getting his cardiac procedure done and fatigue  He did not see the dietitian but on his own he is started reading about improving his diet and he thinks  he is doing better, has had some weight loss        Side effects from medications have been:None  Compliance with the medical regimen: Fair   Glucose monitoring:  done occasionally        Glucometer:   One Touch.      Blood Glucose readings by review of monitor:  Mean values apply above for all meters except median for One Touch  PRE-MEAL Fasting Lunch Dinner Bedtime Overall  Glucose range: 76-154  80, 166      Mean/median: 102     107    POST-MEAL PC Breakfast PC Lunch PC Dinner  Glucose range: 150-205     Mean/median:       Self-care: The diet that the patient has been following is: None, he has difficulty controlling portions     Meals: 3 meals per day. His breakfast  is usually a biscuit at a fast food restaurant.  He is snacking on apples and yogurt  snacks           Exercise: walking 15 minutes on some days         Dietician visit, most recent: none, previously had gone to a class          Weight history: Previous range 260-280  Wt Readings  from Last 3 Encounters:  05/03/16 238 lb (108 kg)  04/05/16 242 lb 3.2 oz (109.9 kg)  03/31/16 245 lb (111.1 kg)    Glycemic control:   Lab Results  Component Value Date   HGBA1C 6.1 (H) 04/27/2016   HGBA1C 7.5 03/02/2016   HGBA1C 7.0 (H) 04/14/2015   Lab Results  Component Value Date   LDLCALC 100 (H) 04/27/2016   CREATININE 1.16 04/27/2016        Medication List       Accurate as of 05/03/16 11:34 AM. Always use your most recent med list.          allopurinol 300 MG tablet Commonly known as:  ZYLOPRIM Take 1 tablet (300 mg total) by mouth daily.   amiodarone 200 MG tablet Commonly known as:  PACERONE Take 1 tablet (200 mg total) by mouth daily.   atorvastatin 10 MG tablet Commonly known as:  LIPITOR Take 1 tablet (10 mg total) by mouth daily.   canagliflozin 300 MG Tabs tablet Commonly known as:  INVOKANA Take 1 tablet (300 mg total) by mouth daily before breakfast.   dexlansoprazole 60 MG  capsule Commonly known as:  DEXILANT Take 1 capsule (60 mg total) by mouth daily.   famotidine 20 MG tablet Commonly known as:  PEPCID Take 1 tablet (20 mg total) by mouth at bedtime.   fenofibrate 160 MG tablet Take 1 tablet (160 mg total) by mouth daily.   fluticasone 50 MCG/ACT nasal spray Commonly known as:  FLONASE Place 2 sprays into both nostrils daily.   glimepiride 4 MG tablet Commonly known as:  AMARYL Take 1 tablet (4 mg total) by mouth daily with breakfast.   glucose blood test strip Commonly known as:  ONE TOUCH ULTRA TEST Use to check blood sugar 3 times per day   levothyroxine 175 MCG tablet Commonly known as:  SYNTHROID, LEVOTHROID Take 1 tablet (175 mcg total) by mouth daily before breakfast.   lisinopril 5 MG tablet Commonly known as:  PRINIVIL,ZESTRIL Take 1 tablet (5 mg total) by mouth daily.   metFORMIN 1000 MG tablet Commonly known as:  GLUCOPHAGE Take 1 tablet (1,000 mg total) by mouth 2 (two) times daily with a meal.   metoprolol succinate 25 MG 24 hr tablet Commonly known as:  TOPROL-XL Take 1 tablet (25 mg total) by mouth daily.   omega-3 acid ethyl esters 1 g capsule Commonly known as:  LOVAZA Take 2 capsules by mouth  two times daily  (4 gram total daily)   ONETOUCH DELICA LANCETS 99991111 Misc Use to check blood sugar 3 times per day.   rivaroxaban 20 MG Tabs tablet Commonly known as:  XARELTO Take 1 tablet by mouth at  bedtime   VITAMIN C PO Take 2 tablets by mouth 2 (two) times daily.       Allergies:  Allergies  Allergen Reactions  . No Known Allergies     Past Medical History:  Diagnosis Date  . Atrial flutter ([place])    typical appearing; s/p ablation 12-2012  . Depression    used to see psych, on no meds as of 03/2009  . Diabetes mellitus    TYPE 2  . Dysrhythmia   . GERD (gastroesophageal reflux disease)   . Gout   . Hyperlipidemia   . Hypothyroidism   . Persistent atrial fibrillation ([place])    PVI 12/2012  .  Rhinitis    vasomotor  . S/P Minimally invasive maze operation for atrial fibrillation 03/12/2016  Complete bilateral atrial lesion set using cryothermy and bipolar radiofrequency ablation with clipping of LA appendage via right mini thoracotomy approach  . S/P patent foramen ovale closure 03/12/2016  . Sleep apnea    mild, now treated with CPAP since ~9-12    Past Surgical History:  Procedure Laterality Date  . ABLATION OF DYSRHYTHMIC FOCUS  01/05/2013   PVI and flutter ablation by [name]  . ATRIAL FIBRILLATION ABLATION N/A 01/05/2013   Procedure: ATRIAL FIBRILLATION ABLATION;  Surgeon: Donegan Grayer, MD;  Location: [facility] CATH LAB;  Service: Cardiovascular;  Laterality: N/A;  . CARDIAC CATHETERIZATION N/A 01/12/2016   Procedure: Right/Left Heart Cath and Coronary Angiography;  Surgeon: [name], MD;  Location: [place] CV LAB;  Service: Cardiovascular;  Laterality: N/A;  . CARDIOVERSION  03/01/11  . CLIPPING OF ATRIAL APPENDAGE Left 03/12/2016   Procedure: CLIPPING OF ATRIAL APPENDAGE;  Surgeon: [name], MD;  Location: [place];  Service: Open Heart Surgery;  Laterality: Left;  . MINIMALLY INVASIVE MAZE PROCEDURE N/A 03/12/2016   Procedure: MINIMALLY INVASIVE MAZE PROCEDURE;  Surgeon: [name], MD;  Location: [place];  Service: Open Heart Surgery;  Laterality: N/A;  . NASAL SINUS SURGERY    . TEE WITHOUT CARDIOVERSION N/A 01/04/2013   Procedure: TRANSESOPHAGEAL ECHOCARDIOGRAM (TEE);  Surgeon: [name], MD;  Location: [facility] ENDOSCOPY;  Service: Cardiovascular;  Laterality: N/A;  . TEE WITHOUT CARDIOVERSION N/A 03/12/2016   Procedure: TRANSESOPHAGEAL ECHOCARDIOGRAM (TEE);  Surgeon: [name], MD;  Location: [place];  Service: Open Heart Surgery;  Laterality: N/A;  . TONSILLECTOMY    . VASECTOMY      Family History  Problem Relation Age of Onset  . Asthma Mother   . Diabetes Father   . Hypertension Brother   . Melanoma Brother   . Allergies Daughter   . Throat  cancer Maternal Uncle   . Colon cancer Paternal Grandfather   . Prostate cancer Neg Hx     Social History:  reports that he has never smoked. He has never used smokeless tobacco. He reports that he does not drink alcohol or use drugs.    Review of Systems   High calcium level: His calcium was 10.6, this is unusual, some point he had low normal calcium levels Does not take any calcium or vitamin D supplements  Most recent eye exam was in  7/15 Showing no retinopathy        Lipids:  has high triglycerides mostly previously as high as 681; LDL previously also consistently high.   On Fenofibrate and taking Lipitor 10 mg daily  Has not had his follow-up with PCP also       Lab Results  Component Value Date   CHOL 175 04/27/2016   HDL 50 04/27/2016   LDLCALC 100 (H) 04/27/2016   TRIG 123 04/27/2016   CHOLHDL 3.5 04/27/2016                   Thyroid: He was diagnosed to have hypothyroidism in 1994 has been on medication since then  He is compliant with his Synthroid and even though his TSH was normal about a month ago his level is now below He does not feel jittery or have any palpitations He is getting the Synthroid from the same So but is getting Generic again  However more recently has been started on amiodarone  Lab Results  Component Value Date   TSH 0.164 (L) 04/27/2016   TSH 2.43 03/30/2016  TSH 8.050 (H) 04/08/2015       The blood pressure has been Treated with low-dose lisinopril and metoprolol    Physical Examination:  BP 126/78 (BP Location: Left Arm, Patient Position: Sitting, Cuff Size: Large)   Pulse 96   Temp 98.6 F (37 C) (Oral)   Wt 238 lb (108 kg)   BMI 30.56 kg/m       ASSESSMENT:  Diabetes type 2, uncontrolled with obesity He is on a 3 drug regimen of metformin, Amaryl and Invokana 300 mg  See history of present illness for detailed discussion of his current management, blood sugar patterns and problems identified  He has difficulty  being consistent with diet And also does not exercise Still having difficulty losing weight He appears to have relatively high resting readings and does not monitor after meals Also showed him that his test strips are expired 3 years ago  HYPERTRIGLYCERIDEMIA: Much improved now, likely to be from improved diet in addition to his triple therapy HYPERCHOLESTEROLEMIA: LDL is borderline, may improve further with better diet and some weight loss  HYPOTHYROIDISM: Currently TSH is low, free T4 not available. Need to reduce his dose because of history of atrial arrhythmias His levothyroxine dose will be reduced down to 175 g, TSH needs to be rechecked on his next labs in about 6 weeks  PLAN:   Continue same regimen for his diabetes  Start checking readings after lunch or supper to help his dietary compliance  Trial of 4 mg Amaryl, only half tablet daily in the morning to see if he still has the same level of control and no potential hypoglycemia during the day  Regular exercise  He will have his thyroid levels checked again after reducing the dose as above, he will get this done at work in 6 weeks.  Check calcium again on the next visit and at least 2 vitamin D level.  Consider PTH if calcium still high  Continue medications for lipids unchanged but may need to increase dose of Lipitor  Patient Instructions  Check blood sugars on waking up  2-3x per week  Also check blood sugars about 2 hours after a meal and do this after different meals by rotation  Recommended blood sugar levels on waking up is 90-130 and about 2 hours after meal is 130-160  Please bring your blood sugar monitor to each visit, thank you  Reduce Glimeperide to 1/2 daily in am      Total visit time for evaluation and management of multiple problems, labs, counseling = 25 minutes  Emberlyn Burlison 05/03/2016, 11:34 AM   Note: This office note was prepared with Estate agent. Any  transcriptional errors that result from this process are unintentional.

## 2016-05-08 ENCOUNTER — Telehealth: Payer: Self-pay | Admitting: Internal Medicine

## 2016-05-08 NOTE — Telephone Encounter (Signed)
Advise patient, due for a repeat CBC, the last time we checked platelets were elevated. Usually does it at work, fax results to me.

## 2016-05-10 ENCOUNTER — Encounter: Payer: Self-pay | Admitting: Internal Medicine

## 2016-05-10 ENCOUNTER — Ambulatory Visit (INDEPENDENT_AMBULATORY_CARE_PROVIDER_SITE_OTHER): Payer: 59 | Admitting: Internal Medicine

## 2016-05-10 VITALS — BP 106/68 | HR 86 | Ht 74.0 in | Wt 239.8 lb

## 2016-05-10 DIAGNOSIS — I4819 Other persistent atrial fibrillation: Secondary | ICD-10-CM

## 2016-05-10 DIAGNOSIS — I481 Persistent atrial fibrillation: Secondary | ICD-10-CM | POA: Diagnosis not present

## 2016-05-10 NOTE — Progress Notes (Signed)
Electrophysiology Office Note   Date:  05/10/2016   ID:  Colin Wood, Colin Wood 07/21/1960, MRN [id number]  PCP:  [name], MD   Primary Electrophysiologist: Klausner Grayer, MD    Chief Complaint  Patient presents with  . Atrial Fibrillation     History of Present Illness: Colin Wood is a 55 y.o. male who presents today for electrophysiology evaluation.   Recovering slowly from recent MAZE.  Denies SOB.  No further afib.  Recent follow-up with PCP revealed low TSH.  He wants to come off of amiodarone at this time. Today, he denies symptoms of chest pain,  orthopnea, PND, lower extremity edema, claudication, dizziness, presyncope, syncope, bleeding, or neurologic sequela. The patient is tolerating medications without difficulties and is otherwise without complaint today.    Past Medical History:  Diagnosis Date  . Atrial flutter ([place])    typical appearing; s/p ablation 12-2012  . Depression    used to see psych, on no meds as of 03/2009  . Diabetes mellitus    TYPE 2  . Dysrhythmia   . GERD (gastroesophageal reflux disease)   . Gout   . Hyperlipidemia   . Hypothyroidism   . Persistent atrial fibrillation ([place])    PVI 12/2012  . Rhinitis    vasomotor  . S/P Minimally invasive maze operation for atrial fibrillation 03/12/2016   Complete bilateral atrial lesion set using cryothermy and bipolar radiofrequency ablation with clipping of LA appendage via right mini thoracotomy approach  . S/P patent foramen ovale closure 03/12/2016  . Sleep apnea    mild, now treated with CPAP since ~9-12   Past Surgical History:  Procedure Laterality Date  . ABLATION OF DYSRHYTHMIC FOCUS  01/05/2013   PVI and flutter ablation by [name]  . ATRIAL FIBRILLATION ABLATION N/A 01/05/2013   Procedure: ATRIAL FIBRILLATION ABLATION;  Surgeon: Danko Grayer, MD;  Location: [facility] CATH LAB;  Service: Cardiovascular;  Laterality: N/A;  . CARDIAC CATHETERIZATION N/A 01/12/2016   Procedure:  Right/Left Heart Cath and Coronary Angiography;  Surgeon: [name], MD;  Location: [place] CV LAB;  Service: Cardiovascular;  Laterality: N/A;  . CARDIOVERSION  03/01/11  . CLIPPING OF ATRIAL APPENDAGE Left 03/12/2016   Procedure: CLIPPING OF ATRIAL APPENDAGE;  Surgeon: [name], MD;  Location: [place];  Service: Open Heart Surgery;  Laterality: Left;  . MINIMALLY INVASIVE MAZE PROCEDURE N/A 03/12/2016   Procedure: MINIMALLY INVASIVE MAZE PROCEDURE;  Surgeon: [name], MD;  Location: [place];  Service: Open Heart Surgery;  Laterality: N/A;  . NASAL SINUS SURGERY    . TEE WITHOUT CARDIOVERSION N/A 01/04/2013   Procedure: TRANSESOPHAGEAL ECHOCARDIOGRAM (TEE);  Surgeon: [name], MD;  Location: [facility] ENDOSCOPY;  Service: Cardiovascular;  Laterality: N/A;  . TEE WITHOUT CARDIOVERSION N/A 03/12/2016   Procedure: TRANSESOPHAGEAL ECHOCARDIOGRAM (TEE);  Surgeon: [name], MD;  Location: [place];  Service: Open Heart Surgery;  Laterality: N/A;  . TONSILLECTOMY    . VASECTOMY       Current Outpatient Prescriptions  Medication Sig Dispense Refill  . allopurinol (ZYLOPRIM) 300 MG tablet Take 1 tablet (300 mg total) by mouth daily. 90 tablet 1  . amiodarone (PACERONE) 200 MG tablet Take 1 tablet (200 mg total) by mouth daily. 90 tablet 3  . Ascorbic Acid (VITAMIN C PO) Take 2 tablets by mouth 2 (two) times daily.    [name] atorvastatin (LIPITOR) 10 MG tablet Take 1 tablet (10 mg total) by mouth daily. Roy  tablet 1  . canagliflozin (INVOKANA) 300 MG TABS tablet Take 1 tablet (300 mg total) by mouth daily before breakfast. 90 tablet 1  . dexlansoprazole (DEXILANT) 60 MG capsule Take 1 capsule (60 mg total) by mouth daily. 90 capsule 1  . famotidine (PEPCID) 20 MG tablet Take 1 tablet (20 mg total) by mouth at bedtime. 90 tablet 1  . fenofibrate 160 MG tablet Take 1 tablet (160 mg total) by mouth daily. 90 tablet 1  . fluticasone (FLONASE) 50 MCG/ACT nasal spray Place 2 sprays into both  nostrils daily. 48 g 3  . glimepiride (AMARYL) 4 MG tablet Take 1 tablet (4 mg total) by mouth daily with breakfast. 90 tablet 1  . glucose blood (ONE TOUCH ULTRA TEST) test strip Use to check blood sugar 3 times per day 300 each 2  . levothyroxine (SYNTHROID, LEVOTHROID) 175 MCG tablet Take 1 tablet (175 mcg total) by mouth daily before breakfast. 30 tablet 3  . lisinopril (PRINIVIL,ZESTRIL) 5 MG tablet Take 1 tablet (5 mg total) by mouth daily. 90 tablet 3  . metFORMIN (GLUCOPHAGE) 1000 MG tablet Take 1 tablet (1,000 mg total) by mouth 2 (two) times daily with a meal. 180 tablet 1  . metoprolol succinate (TOPROL-XL) 25 MG 24 hr tablet Take 1 tablet (25 mg total) by mouth daily. 90 tablet 3  . omega-3 acid ethyl esters (LOVAZA) 1 g capsule Take 2 capsules by mouth  two times daily  (4 gram total daily) 360 capsule 0  . ONETOUCH DELICA LANCETS 99991111 MISC Use to check blood sugar 3 times per day. 300 each 2  . rivaroxaban (XARELTO) 20 MG TABS tablet Take 1 tablet by mouth at  bedtime 90 tablet 3   No current facility-administered medications for this visit.     Allergies:   No known allergies   Social History:  The patient  reports that he has never smoked. He has never used smokeless tobacco. He reports that he does not drink alcohol or use drugs.   Family History:  The patient's  family history includes Allergies in his daughter; Asthma in his mother; Colon cancer in his paternal grandfather; Diabetes in his father; Hypertension in his brother; Melanoma in his brother; Throat cancer in his maternal uncle.    ROS:  Please see the history of present illness.   All other systems are reviewed and negative.    PHYSICAL EXAM: VS:  BP 106/68   Pulse 86   Ht 6\' 2"  (1.88 m)   Wt 239 lb 12.8 oz (108.8 kg)   SpO2 95%   BMI 30.79 kg/m  , BMI Body mass index is 30.79 kg/m. GEN: Well nourished, well developed, in no acute distress  HEENT: normal  Neck: no JVD, carotid bruits, or masses Cardiac:  RRR; no murmurs, rubs, or gallops,no edema  Respiratory:  clear to auscultation bilaterally, normal work of breathing GI: soft, nontender, nondistended, + BS MS: no deformity or atrophy  Skin: incisions s/p recent surgery are slowly healing.  One small inframammary incision will require packing daily per Dr [name] staff Neuro:  Strength and sensation are intact Psych: euthymic mood, full affect  EKG:  EKG is ordered today. The ekg ordered today shows sinus rhythm 84 bpm, incomplete RBBB (QRS 122 Msec), LAD   Recent Labs: 03/13/2016: Magnesium 2.1 03/30/2016: Hemoglobin 11.0; Platelets 542 04/27/2016: ALT 22; BUN 18; Creatinine, Ser 1.16; Potassium 4.6; Sodium 144; TSH 0.164    Lipid Panel     Component  Value Date/Time   CHOL 175 04/27/2016 0747   TRIG 123 04/27/2016 0747   HDL 50 04/27/2016 0747   CHOLHDL 3.5 04/27/2016 0747   LDLCALC 100 (H) 04/27/2016 0747     Wt Readings from Last 3 Encounters:  05/10/16 239 lb 12.8 oz (108.8 kg)  05/03/16 238 lb (108 kg)  04/05/16 242 lb 3.2 oz (109.9 kg)     ASSESSMENT AND PLAN:  Assessment and Plan: 1. Persistent AFIb Doing well s/p MAZE.  Maintaining sinus rhythm. Given recent thyroid dysfunction, will stop amiodarone today. Continue xarelto  2 Lifestyle risk factors for recurrent afib Weight loss, exercise program and consistent use of CPAP encouraged.  3. [name] stable  4. Chronic systolic dysfunction Likely tachycardia mediated Repeat echo upon return  Follow-up with Dr Roxy Manns as scheduled Return to see me in 3 months  Signed, Sweetland Grayer, MD  05/10/2016 9:46 AM     [facility] [address] [place] [address] [phone] (office) [phone] (fax)

## 2016-05-10 NOTE — Patient Instructions (Addendum)
Medication Instructions: Your physician has recommended you make the following change in your medication:  --1. STOP AMIODORANE   Labwork: None Ordered  Procedures/Testing: Your physician has requested that you have an echocardiogram 1-2 says prior to the 3 month follow up appointment. Echocardiography is a painless test that uses sound waves to create images of your heart. It provides your doctor with information about the size and shape of your heart and how well your heart's chambers and valves are working. This procedure takes approximately one hour. There are no restrictions for this procedure.    Follow-Up: Your physician recommends that you schedule a follow-up appointment in: 3 MONTHS WITH DR. ALLRED.   Any Additional Special Instructions Will Be Listed Below (If Applicable).     If you need a refill on your cardiac medications before your next appointment, please call your pharmacy.

## 2016-05-10 NOTE — Telephone Encounter (Signed)
My Chart message sent

## 2016-05-17 ENCOUNTER — Other Ambulatory Visit: Payer: Self-pay | Admitting: Internal Medicine

## 2016-06-04 ENCOUNTER — Other Ambulatory Visit: Payer: Self-pay | Admitting: Thoracic Surgery (Cardiothoracic Vascular Surgery)

## 2016-06-04 DIAGNOSIS — I4891 Unspecified atrial fibrillation: Secondary | ICD-10-CM

## 2016-06-07 ENCOUNTER — Encounter: Payer: Self-pay | Admitting: Thoracic Surgery (Cardiothoracic Vascular Surgery)

## 2016-06-07 ENCOUNTER — Ambulatory Visit
Admission: RE | Admit: 2016-06-07 | Discharge: 2016-06-07 | Disposition: A | Discharge status: Home / Self Care | Payer: 59 | Source: Ambulatory Visit | Attending: Thoracic Surgery (Cardiothoracic Vascular Surgery) | Admitting: Thoracic Surgery (Cardiothoracic Vascular Surgery)

## 2016-06-07 ENCOUNTER — Ambulatory Visit (INDEPENDENT_AMBULATORY_CARE_PROVIDER_SITE_OTHER): Payer: Self-pay | Admitting: Thoracic Surgery (Cardiothoracic Vascular Surgery)

## 2016-06-07 VITALS — BP 131/77 | HR 99 | Resp 16 | Ht 74.0 in | Wt 239.0 lb

## 2016-06-07 DIAGNOSIS — J9811 Atelectasis: Secondary | ICD-10-CM | POA: Diagnosis not present

## 2016-06-07 DIAGNOSIS — Z9889 Other specified postprocedural states: Secondary | ICD-10-CM

## 2016-06-07 DIAGNOSIS — I4891 Unspecified atrial fibrillation: Secondary | ICD-10-CM

## 2016-06-07 DIAGNOSIS — Z8679 Personal history of other diseases of the circulatory system: Secondary | ICD-10-CM

## 2016-06-07 NOTE — Progress Notes (Signed)
[address]       [place] [address]             [phone]     CARDIOTHORACIC SURGERY OFFICE NOTE  Referring Provider is Kristiansen Grayer, MD PCP is [name], MD   HPI:  Patient is a 56 year old obese white male who returns to the office today for routine follow-up approximately 3 months status post minimally invasive Maze procedure and closure of patent foramen ovale on 03/12/2016 for recurrent persistent atrial fibrillation.  His postoperative recovery has been uneventful and he was last seen here in our office on 04/05/2016 at which time he was doing well and maintaining sinus rhythm. Since then he has been seen in follow-up by [name] on 05/10/2016. Amiodarone was stopped at that time. He returns to the office today and reports that he is doing quite well. He continues to maintain sinus rhythm. He no longer has any significant soreness in his chest. Exercise tolerance is good. He is pleased with his progress.    Current Outpatient Prescriptions  Medication Sig Dispense Refill  . allopurinol (ZYLOPRIM) 300 MG tablet Take 1 tablet (300 mg total) by mouth daily. 90 tablet 1  . Ascorbic Acid (VITAMIN C PO) Take 2 tablets by mouth 2 (two) times daily.    [name] atorvastatin (LIPITOR) 10 MG tablet Take 1 tablet (10 mg total) by mouth daily. 90 tablet 1  . canagliflozin (INVOKANA) 300 MG TABS tablet Take 1 tablet (300 mg total) by mouth daily before breakfast. 90 tablet 1  . dexlansoprazole (DEXILANT) 60 MG capsule Take 1 capsule (60 mg total) by mouth daily. 90 capsule 1  . fenofibrate 160 MG tablet Take 1 tablet (160 mg total) by mouth daily. 90 tablet 1  . fluticasone (FLONASE) 50 MCG/ACT nasal spray Place 2 sprays into both nostrils daily. 48 g 3  . glimepiride (AMARYL) 4 MG tablet Take 1 tablet (4 mg total) by mouth daily with breakfast. 90 tablet 1  . glucose blood (ONE TOUCH ULTRA TEST) test strip Use to check blood sugar 3 times per day 300 each 2  .  levothyroxine (SYNTHROID, LEVOTHROID) 175 MCG tablet Take 1 tablet (175 mcg total) by mouth daily before breakfast. 30 tablet 3  . lisinopril (PRINIVIL,ZESTRIL) 5 MG tablet Take 1 tablet (5 mg total) by mouth daily. 90 tablet 3  . metFORMIN (GLUCOPHAGE) 1000 MG tablet Take 1 tablet (1,000 mg total) by mouth 2 (two) times daily with a meal. 180 tablet 1  . metoprolol succinate (TOPROL-XL) 25 MG 24 hr tablet Take 1 tablet (25 mg total) by mouth daily. 90 tablet 3  . omega-3 acid ethyl esters (LOVAZA) 1 g capsule Take 2 capsules by mouth  two times daily  (4 gram total daily) 360 capsule 0  . ONETOUCH DELICA LANCETS 99991111 MISC Use to check blood sugar 3 times per day. 300 each 2  . rivaroxaban (XARELTO) 20 MG TABS tablet Take 1 tablet by mouth at  bedtime 90 tablet 3  . famotidine (PEPCID) 20 MG tablet Take 1 tablet (20 mg total) by mouth at bedtime. 90 tablet 1   No current facility-administered medications for this visit.       Physical Exam:   BP 131/77 (BP Location: Right Arm, Patient Position: Sitting, Cuff Size: Large)   Pulse 99   Resp 16   Ht 6\' 2"  (1.88 m)   Wt 239 lb (108.4 kg)   SpO2 97% Comment: ON  RA  BMI 30.69 kg/m   General:  Well-appearing  Chest:   Clear to auscultation  CV:   Regular rate and rhythm without murmur  Incisions:  Completely healed  Abdomen:  Soft nontender  Extremities:  Warm and well-perfused  Diagnostic Tests:  2 channel telemetry rhythm strip demonstrates normal sinus rhythm   Impression:  Patient is doing well and maintaining sinus rhythm approximately 3 months status post minimally invasive Maze procedure. He remains chronically anticoagulated using Xarelto. His CHADS-VASC score is 1 with relatively low risk for embolic stroke.  Plan:  I have instructed the patient that he may resume unrestricted physical activity at this time. We have not recommended any changes to his current medications. It might be reasonable to consider stopping  anticoagulation the future if he continues to maintain sinus rhythm. He will continue to follow-up with [name] and Roderic Palau in the atrial fibrillation clinic. He will return to our office for follow-up next October, approximately 1 year following his original surgery. During the interim he will call and return to see [place] only should specific problems or questions arise.    [name]. Roxy Manns, MD 06/07/2016 2:49 PM

## 2016-06-07 NOTE — Patient Instructions (Signed)
Continue all previous medications without any changes at this time  You may resume unrestricted physical activity without any particular limitations at this time.   

## 2016-06-14 ENCOUNTER — Other Ambulatory Visit: Payer: Self-pay | Admitting: Endocrinology

## 2016-06-14 DIAGNOSIS — E039 Hypothyroidism, unspecified: Secondary | ICD-10-CM | POA: Diagnosis not present

## 2016-06-15 LAB — T4, FREE: Free T4: 1.78 ng/dL — ABNORMAL HIGH (ref 0.82–1.77)

## 2016-06-15 LAB — VITAMIN D 25 HYDROXY (VIT D DEFICIENCY, FRACTURES): VIT D 25 HYDROXY: 19.5 ng/mL — AB (ref 30.0–100.0)

## 2016-06-15 LAB — TSH: TSH: 0.271 u[IU]/mL — AB (ref 0.450–4.500)

## 2016-06-15 LAB — CALCIUM: Calcium: 9.8 mg/dL (ref 8.7–10.2)

## 2016-06-30 ENCOUNTER — Telehealth: Payer: Self-pay | Admitting: Endocrinology

## 2016-06-30 ENCOUNTER — Other Ambulatory Visit: Payer: Self-pay

## 2016-06-30 MED ORDER — CANAGLIFLOZIN 300 MG PO TABS: 300.0000 mg | ORAL_TABLET | Freq: Every day | ORAL | 1 refills | Status: DC

## 2016-06-30 NOTE — Telephone Encounter (Signed)
rx submitted.  

## 2016-06-30 NOTE — Telephone Encounter (Signed)
Refill   canagliflozin (INVOKANA) 300 MG TABS tablet  30 supply  [id number] - [place], [place] - [place] [phone] (Phone) [phone] (Fax)

## 2016-07-02 ENCOUNTER — Other Ambulatory Visit: Payer: Self-pay | Admitting: Endocrinology

## 2016-07-07 DIAGNOSIS — G4733 Obstructive sleep apnea (adult) (pediatric): Secondary | ICD-10-CM | POA: Diagnosis not present

## 2016-07-20 ENCOUNTER — Telehealth (HOSPITAL_COMMUNITY): Payer: Self-pay | Admitting: Internal Medicine

## 2016-07-20 ENCOUNTER — Encounter (HOSPITAL_COMMUNITY): Payer: Self-pay | Admitting: Internal Medicine

## 2016-07-20 NOTE — Telephone Encounter (Signed)
Mailed letter with [facility] along with my chart message... KJ  °

## 2016-07-30 ENCOUNTER — Encounter: Payer: Self-pay | Admitting: Internal Medicine

## 2016-07-30 ENCOUNTER — Other Ambulatory Visit: Payer: Self-pay

## 2016-07-30 MED ORDER — LEVOTHYROXINE SODIUM 175 MCG PO TABS: 175.0000 ug | ORAL_TABLET | Freq: Every day | ORAL | 2 refills | Status: DC

## 2016-08-02 ENCOUNTER — Ambulatory Visit (HOSPITAL_COMMUNITY): Payer: 59 | Attending: Cardiovascular Disease

## 2016-08-02 ENCOUNTER — Other Ambulatory Visit: Payer: Self-pay

## 2016-08-02 DIAGNOSIS — I481 Persistent atrial fibrillation: Secondary | ICD-10-CM | POA: Diagnosis not present

## 2016-08-02 DIAGNOSIS — I071 Rheumatic tricuspid insufficiency: Secondary | ICD-10-CM | POA: Diagnosis not present

## 2016-08-02 DIAGNOSIS — I4819 Other persistent atrial fibrillation: Secondary | ICD-10-CM

## 2016-08-03 ENCOUNTER — Ambulatory Visit: Payer: 59 | Admitting: Endocrinology

## 2016-08-07 ENCOUNTER — Other Ambulatory Visit: Payer: Self-pay | Admitting: Internal Medicine

## 2016-08-09 ENCOUNTER — Encounter: Payer: Self-pay | Admitting: Internal Medicine

## 2016-08-09 ENCOUNTER — Ambulatory Visit (INDEPENDENT_AMBULATORY_CARE_PROVIDER_SITE_OTHER): Payer: 59 | Admitting: Internal Medicine

## 2016-08-09 VITALS — BP 131/78 | HR 91 | Ht 74.0 in | Wt 247.4 lb

## 2016-08-09 DIAGNOSIS — I481 Persistent atrial fibrillation: Secondary | ICD-10-CM | POA: Diagnosis not present

## 2016-08-09 DIAGNOSIS — I4819 Other persistent atrial fibrillation: Secondary | ICD-10-CM

## 2016-08-09 DIAGNOSIS — Z9889 Other specified postprocedural states: Secondary | ICD-10-CM | POA: Diagnosis not present

## 2016-08-09 DIAGNOSIS — Z8679 Personal history of other diseases of the circulatory system: Secondary | ICD-10-CM | POA: Diagnosis not present

## 2016-08-09 NOTE — Patient Instructions (Signed)
Medication Instructions:  Your physician recommends that you continue on your current medications as directed. Please refer to the Current Medication list given to you today.   Labwork: None ordered   Testing/Procedures: None ordered   Follow-Up:  Your physician recommends that you schedule a follow-up appointment in: 3 months with Donna Carroll, NP and 6 months with Dr Allred   Any Other Special Instructions Will Be Listed Below (If Applicable).     If you need a refill on your cardiac medications before your next appointment, please call your pharmacy.   

## 2016-08-09 NOTE — Telephone Encounter (Signed)
Received request for Xarelto 20mg ; pt age 56 yrs old, wt-108.4kg on 06/07/16, Crea-1.16 on 04/27/16, CrCl-109.02, last seen by cardiology on 04/2016; will send in Xarelto 20mg  to requested pharmacy.

## 2016-08-09 NOTE — Progress Notes (Signed)
Electrophysiology Office Note   Date:  08/09/2016   ID:  Colin, Wood March 21, 1961, MRN [id number]  PCP:  [name], MD   Primary Electrophysiologist: [name], MD    Chief Complaint  Patient presents with  . Atrial Fibrillation     History of Present Illness: Colin Wood is a 56 y.o. male who presents today for electrophysiology evaluation.  Remains in sinus rhythm.  EF has recovered.  He has URI symptoms right now which he attributes to "a cold".  Otherwise doing well.  Today, he denies symptoms of chest pain,  orthopnea, PND, lower extremity edema, claudication, dizziness, presyncope, syncope, bleeding, or neurologic sequela. The patient is tolerating medications without difficulties and is otherwise without complaint today.    Past Medical History:  Diagnosis Date  . Atrial flutter ([name])    typical appearing; s/p ablation 12-2012  . Depression    used to see psych, on no meds as of 03/2009  . Diabetes mellitus    TYPE 2  . GERD (gastroesophageal reflux disease)   . Gout   . Hyperlipidemia   . Hypothyroidism   . Persistent atrial fibrillation ([name])    PVI 12/2012  . Rhinitis    vasomotor  . S/P Minimally invasive maze operation for atrial fibrillation 03/12/2016   Complete bilateral atrial lesion set using cryothermy and bipolar radiofrequency ablation with clipping of LA appendage via right mini thoracotomy approach  . S/P patent foramen ovale closure 03/12/2016  . Sleep apnea    mild, now treated with CPAP since ~9-12   Past Surgical History:  Procedure Laterality Date  . ABLATION OF DYSRHYTHMIC FOCUS  01/05/2013   PVI and flutter ablation by [name]  . ATRIAL FIBRILLATION ABLATION N/A 01/05/2013   Procedure: ATRIAL FIBRILLATION ABLATION;  Surgeon: [name], MD;  Location: [facility] CATH LAB;  Service: Cardiovascular;  Laterality: N/A;  . CARDIAC CATHETERIZATION N/A 01/12/2016   Procedure: Right/Left Heart Cath and Coronary Angiography;  Surgeon:  [name], MD;  Location: [facility] CV LAB;  Service: Cardiovascular;  Laterality: N/A;  . CARDIOVERSION  03/01/11  . CLIPPING OF ATRIAL APPENDAGE Left 03/12/2016   Procedure: CLIPPING OF ATRIAL APPENDAGE;  Surgeon: [name], MD;  Location: [facility];  Service: Open Heart Surgery;  Laterality: Left;  . MINIMALLY INVASIVE MAZE PROCEDURE N/A 03/12/2016   Procedure: MINIMALLY INVASIVE MAZE PROCEDURE;  Surgeon: [name], MD;  Location: [facility];  Service: Open Heart Surgery;  Laterality: N/A;  . NASAL SINUS SURGERY    . TEE WITHOUT CARDIOVERSION N/A 01/04/2013   Procedure: TRANSESOPHAGEAL ECHOCARDIOGRAM (TEE);  Surgeon: [name], MD;  Location: [facility] ENDOSCOPY;  Service: Cardiovascular;  Laterality: N/A;  . TEE WITHOUT CARDIOVERSION N/A 03/12/2016   Procedure: TRANSESOPHAGEAL ECHOCARDIOGRAM (TEE);  Surgeon: [name], MD;  Location: [place];  Service: Open Heart Surgery;  Laterality: N/A;  . TONSILLECTOMY    . VASECTOMY       Current Outpatient Prescriptions  Medication Sig Dispense Refill  . allopurinol (ZYLOPRIM) 300 MG tablet Take 1 tablet (300 mg total) by mouth daily. 90 tablet 1  . Ascorbic Acid (VITAMIN C PO) Take 2 tablets by mouth 2 (two) times daily.    [name] atorvastatin (LIPITOR) 10 MG tablet Take 1 tablet (10 mg total) by mouth daily. 90 tablet 1  . canagliflozin (INVOKANA) 300 MG TABS tablet Take 1 tablet (300 mg total) by mouth daily before breakfast. 90 tablet 1  . dexlansoprazole (DEXILANT) 60  MG capsule Take 1 capsule (60 mg total) by mouth daily. 90 capsule 1  . famotidine (PEPCID) 20 MG tablet Take 1 tablet (20 mg total) by mouth at bedtime. 90 tablet 1  . fenofibrate 160 MG tablet Take 1 tablet (160 mg total) by mouth daily. 90 tablet 1  . fluticasone (FLONASE) 50 MCG/ACT nasal spray Place 2 sprays into both nostrils daily. 48 g 3  . glimepiride (AMARYL) 4 MG tablet Take 1 tablet (4 mg total) by mouth daily with breakfast. 90 tablet 1  . glucose blood (ONE  TOUCH ULTRA TEST) test strip Use to check blood sugar 3 times per day 300 each 2  . levothyroxine (SYNTHROID, LEVOTHROID) 200 MCG tablet Take 200 mcg by mouth daily before breakfast.    . lisinopril (PRINIVIL,ZESTRIL) 5 MG tablet Take 1 tablet (5 mg total) by mouth daily. 90 tablet 3  . metFORMIN (GLUCOPHAGE) 1000 MG tablet TAKE 1 TABLET BY MOUTH TWO  TIMES DAILY WITH A MEAL 180 tablet 1  . metoprolol succinate (TOPROL-XL) 25 MG 24 hr tablet Take 1 tablet (25 mg total) by mouth daily. 90 tablet 3  . omega-3 acid ethyl esters (LOVAZA) 1 g capsule Take 2 capsules by mouth  two times daily  (4 gram total daily) 360 capsule 0  . ONETOUCH DELICA LANCETS 19E MISC Use to check blood sugar 3 times per day. 300 each 2  . XARELTO 20 MG TABS tablet TAKE 1 TABLET BY MOUTH AT  BEDTIME 90 tablet 1   No current facility-administered medications for this visit.     Allergies:   No known allergies   Social History:  The patient  reports that he has never smoked. He has never used smokeless tobacco. He reports that he does not drink alcohol or use drugs.   Family History:  The patient's  family history includes Allergies in his daughter; Asthma in his mother; Colon cancer in his paternal grandfather; Diabetes in his father; Hypertension in his brother; Melanoma in his brother; Throat cancer in his maternal uncle.    ROS:  Please see the history of present illness.   All other systems are reviewed and negative.    PHYSICAL EXAM: VS:  BP 131/78   Pulse 91   Ht 6\' 2"  (1.88 m)   Wt 247 lb 6 oz (112.2 kg)   SpO2 97%   BMI 31.76 kg/m  , BMI Body mass index is 31.76 kg/m. GEN: Well nourished, well developed, in no acute distress , wearing a mask today HEENT: normal  Neck: no JVD, carotid bruits, or masses Cardiac: RRR; no murmurs, rubs, or gallops,no edema  Respiratory:  clear to auscultation bilaterally, normal work of breathing GI: soft, nontender, nondistended, + BS MS: no deformity or atrophy  Skin:  incisions s/p recent surgery are slowly healing.  One small inframammary incision will require packing daily per Dr [name] staff Neuro:  Strength and sensation are intact Psych: euthymic mood, full affect  EKG:  EKG is ordered today. The ekg ordered today shows sinus rhythm 91 bpm, incomplete RBBB (QRS 128 Msec), LAD   Recent Labs: 03/13/2016: Magnesium 2.1 03/30/2016: Hemoglobin 11.0; Platelets 542 04/27/2016: ALT 22; BUN 18; Creatinine, Ser 1.16; Potassium 4.6; Sodium 144 06/14/2016: TSH 0.271    Lipid Panel     Component Value Date/Time   CHOL 175 04/27/2016 0747   TRIG 123 04/27/2016 0747   HDL 50 04/27/2016 0747   CHOLHDL 3.5 04/27/2016 0747   LDLCALC 100 (H) 04/27/2016  0747     Wt Readings from Last 3 Encounters:  08/09/16 247 lb 6 oz (112.2 kg)  06/07/16 239 lb (108.4 kg)  05/10/16 239 lb 12.8 oz (108.8 kg)     ASSESSMENT AND PLAN:  Assessment and Plan: 1. Persistent AFIb Doing well s/p MAZE.  Maintaining sinus rhythm off AAD therapy Continue xarelto for chadsvasc score of at least 2 (DM, HTN)  2 Lifestyle risk factors for recurrent afib Weight loss, exercise program and compliance with use of CPAP encouraged.  3. [name] stable  4. Chronic systolic dysfunction EF has returned to normal with sinus  Follow-up with Dr Roxy Manns as scheduled Return to AF clinic in 3 months I will see in 6 months  Signed, Giuffre Grayer, MD  08/09/2016 8:55 AM     [place] [address] [address] [place] [address] [phone] (office) [phone] (fax)

## 2016-08-13 ENCOUNTER — Ambulatory Visit (INDEPENDENT_AMBULATORY_CARE_PROVIDER_SITE_OTHER): Payer: 59 | Admitting: Family Medicine

## 2016-08-13 ENCOUNTER — Encounter: Payer: Self-pay | Admitting: Family Medicine

## 2016-08-13 VITALS — BP 128/60 | HR 106 | Temp 98.9°F | Ht 74.0 in | Wt 249.0 lb

## 2016-08-13 DIAGNOSIS — J0100 Acute maxillary sinusitis, unspecified: Secondary | ICD-10-CM | POA: Diagnosis not present

## 2016-08-13 DIAGNOSIS — R05 Cough: Secondary | ICD-10-CM | POA: Diagnosis not present

## 2016-08-13 DIAGNOSIS — R059 Cough, unspecified: Secondary | ICD-10-CM

## 2016-08-13 MED ORDER — BENZONATATE 100 MG PO CAPS: 100.0000 mg | ORAL_CAPSULE | Freq: Three times a day (TID) | ORAL | 0 refills | Status: DC | PRN

## 2016-08-13 MED ORDER — AMOXICILLIN-POT CLAVULANATE 875-125 MG PO TABS: 1.0000 | ORAL_TABLET | Freq: Two times a day (BID) | ORAL | 0 refills | Status: DC

## 2016-08-13 NOTE — Progress Notes (Signed)
Chief Complaint  Patient presents with  . Sinus Problem    blowing yellow,brown,green,cough(product-clear,green,brown),st-sxs started 1 week ago Wed and getting worse.    Loel Lofty here for URI complaints.  Duration: 9 days  Associated symptoms: sinus congestion, sinus pain, rhinorrhea, ST, sweating and cough Denies: ear pain, ear drainage, shortness of breath, myalgia and fevers/rigors Treatment to date: Benadryl at night, ibuprofen Sick contacts: No  ROS:  Const: Denies fevers HEENT: As noted in HPI Lungs: No SOB  Past Medical History:  Diagnosis Date  . Atrial flutter ([place])    typical appearing; s/p ablation 12-2012  . Depression    used to see psych, on no meds as of 03/2009  . Diabetes mellitus    TYPE 2  . GERD (gastroesophageal reflux disease)   . Gout   . Hyperlipidemia   . Hypothyroidism   . Persistent atrial fibrillation ([place])    PVI 12/2012  . Rhinitis    vasomotor  . S/P Minimally invasive maze operation for atrial fibrillation 03/12/2016   Complete bilateral atrial lesion set using cryothermy and bipolar radiofrequency ablation with clipping of LA appendage via right mini thoracotomy approach  . S/P patent foramen ovale closure 03/12/2016  . Sleep apnea    mild, now treated with CPAP since ~9-12   Family History  Problem Relation Age of Onset  . Asthma Mother   . Diabetes Father   . Hypertension Brother   . Melanoma Brother   . Allergies Daughter   . Throat cancer Maternal Uncle   . Colon cancer Paternal Grandfather   . Prostate cancer Neg Hx     BP 128/60 (BP Location: Left Arm, Patient Position: Sitting, Cuff Size: Large)   Pulse (!) 106   Temp 98.9 F (37.2 C) (Oral)   Ht 6\' 2"  (1.88 m)   Wt 249 lb (112.9 kg)   SpO2 98%   BMI 31.97 kg/m  General: Awake, alert, appears stated age HEENT: AT, [place], ears patent b/l and TM's neg, nares patent w/o discharge, mild TTP over max sinuses b/l, pharynx pink and without exudates, MMM Neck: No  masses or asymmetry Heart: RRR, no murmurs, no bruits Lungs: CTAB, no accessory muscle use Psych: Age appropriate judgment and insight, normal mood and affect  Acute maxillary sinusitis, recurrence not specified - Plan: amoxicillin-clavulanate (AUGMENTIN) 875-125 MG tablet  Cough - Plan: benzonatate (TESSALON) 100 MG capsule  Orders as above. If worsening by tomorrow or no improvement by day 14, OK to use abx. Dry sounding cough during exam, caused tachycardia, surprised it did not cause increase in BP. OK to tx for this. Continue to push fluids, practice good hand hygiene, cover mouth when coughing. F/u prn. If starting to experience fevers, shaking, or shortness of breath, seek immediate care. Pt voiced understanding and agreement to the plan.  [place], DO 08/13/16 2:03 PM

## 2016-08-13 NOTE — Patient Instructions (Addendum)
Continue to push fluids, practice good hand hygiene, and cover your mouth if you cough.  If you start having fevers, shaking or shortness of breath, seek immediate care.  Most sinus infections are viral in etiology and antibiotics will not be helpful. That being said, if you start having worsening symptoms over 3 days, you are worsening by day 10 or not improving by day 14, go ahead and take it. You are on Day 9 as of now.

## 2016-08-13 NOTE — Progress Notes (Signed)
Pre visit review using our clinic review tool, if applicable. No additional management support is needed unless otherwise documented below in the visit note. 

## 2016-08-16 ENCOUNTER — Other Ambulatory Visit: Payer: Self-pay | Admitting: Endocrinology

## 2016-08-17 ENCOUNTER — Telehealth: Payer: Self-pay

## 2016-08-17 NOTE — Telephone Encounter (Signed)
Left message on patients voicemail of normal and stable echo results. Encouraged patient to call office if any questions or concerns.

## 2016-09-09 ENCOUNTER — Other Ambulatory Visit: Payer: Self-pay

## 2016-09-09 ENCOUNTER — Other Ambulatory Visit: Payer: Self-pay | Admitting: Endocrinology

## 2016-09-09 ENCOUNTER — Other Ambulatory Visit: Payer: Self-pay | Admitting: Internal Medicine

## 2016-09-28 ENCOUNTER — Ambulatory Visit (INDEPENDENT_AMBULATORY_CARE_PROVIDER_SITE_OTHER): Payer: 59 | Admitting: Internal Medicine

## 2016-09-28 ENCOUNTER — Encounter: Payer: Self-pay | Admitting: Internal Medicine

## 2016-09-28 VITALS — BP 132/70 | HR 79 | Temp 97.9°F | Resp 14 | Ht 74.0 in | Wt 246.4 lb

## 2016-09-28 DIAGNOSIS — D649 Anemia, unspecified: Secondary | ICD-10-CM

## 2016-09-28 DIAGNOSIS — I483 Typical atrial flutter: Secondary | ICD-10-CM

## 2016-09-28 DIAGNOSIS — Z0000 Encounter for general adult medical examination without abnormal findings: Secondary | ICD-10-CM

## 2016-09-28 DIAGNOSIS — Z125 Encounter for screening for malignant neoplasm of prostate: Secondary | ICD-10-CM

## 2016-09-28 DIAGNOSIS — K219 Gastro-esophageal reflux disease without esophagitis: Secondary | ICD-10-CM

## 2016-09-28 DIAGNOSIS — M109 Gout, unspecified: Secondary | ICD-10-CM | POA: Diagnosis not present

## 2016-09-28 DIAGNOSIS — Z1211 Encounter for screening for malignant neoplasm of colon: Secondary | ICD-10-CM

## 2016-09-28 MED ORDER — DEXLANSOPRAZOLE 60 MG PO CPDR: 60.0000 mg | DELAYED_RELEASE_CAPSULE | Freq: Every day | ORAL | 3 refills | Status: DC

## 2016-09-28 MED ORDER — FLUTICASONE PROPIONATE 50 MCG/ACT NA SUSP: 2.0000 | Freq: Every day | NASAL | 3 refills | Status: DC

## 2016-09-28 MED ORDER — FENOFIBRATE 160 MG PO TABS: 160.0000 mg | ORAL_TABLET | Freq: Every day | ORAL | 3 refills | Status: DC

## 2016-09-28 MED ORDER — FAMOTIDINE 20 MG PO TABS: 20.0000 mg | ORAL_TABLET | Freq: Every day | ORAL | 0 refills | Status: DC

## 2016-09-28 MED ORDER — ALLOPURINOL 300 MG PO TABS: 300.0000 mg | ORAL_TABLET | Freq: Every day | ORAL | 3 refills | Status: DC

## 2016-09-28 NOTE — Assessment & Plan Note (Signed)
Refer for colonoscopy Prostate cancer screening: DRE at baseline compared to 2015, check a PSA CPX on RTC

## 2016-09-28 NOTE — Progress Notes (Signed)
Subjective:    Patient ID: Colin Wood, male    DOB: November 27, 1960, 56 y.o.   MRN: [id number]  DOS:  09/28/2016 Type of visit - description : Routine checkup Interval history: History of a flutter, cardiology note reviewed Request a referral for a colonoscopy Gout: On allopurinol, no recent episodes  GERD: On 2 medications, unable to stop PPIs due to resurface of symptoms Obesity: Unable to lose weight, alternatives?   Review of Systems Denies chest pain, difficulty breathing or palpitations. No dysuria, gross hematuria difficulty urinating  Past Medical History:  Diagnosis Date  . Atrial flutter ([place])    typical appearing; s/p ablation 12-2012  . Depression    used to see psych, on no meds as of 03/2009  . Diabetes mellitus    TYPE 2  . GERD (gastroesophageal reflux disease)   . Gout   . Hyperlipidemia   . Hypothyroidism   . Persistent atrial fibrillation ([place])    PVI 12/2012  . Rhinitis    vasomotor  . S/P Minimally invasive maze operation for atrial fibrillation 03/12/2016   Complete bilateral atrial lesion set using cryothermy and bipolar radiofrequency ablation with clipping of LA appendage via right mini thoracotomy approach  . S/P patent foramen ovale closure 03/12/2016  . Sleep apnea    mild, now treated with CPAP since ~9-12    Past Surgical History:  Procedure Laterality Date  . ABLATION OF DYSRHYTHMIC FOCUS  01/05/2013   PVI and flutter ablation by [name]  . ATRIAL FIBRILLATION ABLATION N/A 01/05/2013   Procedure: ATRIAL FIBRILLATION ABLATION;  Surgeon: Nauman Grayer, MD;  Location: [facility] CATH LAB;  Service: Cardiovascular;  Laterality: N/A;  . CARDIAC CATHETERIZATION N/A 01/12/2016   Procedure: Right/Left Heart Cath and Coronary Angiography;  Surgeon: [name], MD;  Location: [facility] CV LAB;  Service: Cardiovascular;  Laterality: N/A;  . CARDIOVERSION  03/01/11  . CLIPPING OF ATRIAL APPENDAGE Left 03/12/2016   Procedure: CLIPPING OF ATRIAL  APPENDAGE;  Surgeon: [name], MD;  Location: [facility];  Service: Open Heart Surgery;  Laterality: Left;  . MINIMALLY INVASIVE MAZE PROCEDURE N/A 03/12/2016   Procedure: MINIMALLY INVASIVE MAZE PROCEDURE;  Surgeon: [name], MD;  Location: [facility];  Service: Open Heart Surgery;  Laterality: N/A;  . NASAL SINUS SURGERY    . TEE WITHOUT CARDIOVERSION N/A 01/04/2013   Procedure: TRANSESOPHAGEAL ECHOCARDIOGRAM (TEE);  Surgeon: [name], MD;  Location: [facility] ENDOSCOPY;  Service: Cardiovascular;  Laterality: N/A;  . TEE WITHOUT CARDIOVERSION N/A 03/12/2016   Procedure: TRANSESOPHAGEAL ECHOCARDIOGRAM (TEE);  Surgeon: [name], MD;  Location: [facility] [place];  Service: Open Heart Surgery;  Laterality: N/A;  . TONSILLECTOMY    . VASECTOMY      Social History   Social History  . Marital status: Married    Spouse name: N/A  . Number of children: 2  . Years of education: N/A   Occupational History  . works at [employer] Topics  . Smoking status: Never Smoker  . Smokeless tobacco: Never Used  . Alcohol use No  . Drug use: No  . Sexual activity: Not on file   Other Topics Concern  . Not on file   Social History Narrative   Pt lives in [place] with spouse and children 50 and 11 y/o.  Works in Engineer, technical sales at [employer].      Allergies as of 09/28/2016   No Known Allergies     Medication  List       Accurate as of 09/28/16 11:59 PM. Always use your most recent med list.          allopurinol 300 MG tablet Commonly known as:  ZYLOPRIM Take 1 tablet (300 mg total) by mouth daily.   atorvastatin 10 MG tablet Commonly known as:  LIPITOR TAKE 1 TABLET BY MOUTH  DAILY   canagliflozin 300 MG Tabs tablet Commonly known as:  INVOKANA Take 1 tablet (300 mg total) by mouth daily before breakfast.   dexlansoprazole 60 MG capsule Commonly known as:  DEXILANT Take 1 capsule (60 mg total) by mouth daily.   famotidine 20 MG tablet Commonly known as:  PEPCID Take 1 tablet  (20 mg total) by mouth at bedtime.   fenofibrate 160 MG tablet Take 1 tablet (160 mg total) by mouth daily.   fluticasone 50 MCG/ACT nasal spray Commonly known as:  FLONASE Place 2 sprays into both nostrils daily.   glimepiride 4 MG tablet Commonly known as:  AMARYL TAKE 1 TABLET BY MOUTH  DAILY WITH BREAKFAST   glucose blood test strip Commonly known as:  ONE TOUCH ULTRA TEST Use to check blood sugar 3 times per day   levothyroxine 200 MCG tablet Commonly known as:  SYNTHROID, LEVOTHROID Take 200 mcg by mouth daily before breakfast.   lisinopril 5 MG tablet Commonly known as:  PRINIVIL,ZESTRIL TAKE 1 TABLET BY MOUTH  DAILY   metFORMIN 1000 MG tablet Commonly known as:  GLUCOPHAGE TAKE 1 TABLET BY MOUTH TWO  TIMES DAILY WITH A MEAL   metoprolol succinate 25 MG 24 hr tablet Commonly known as:  TOPROL-XL Take 1 tablet (25 mg total) by mouth daily.   multivitamin tablet Take 1 tablet by mouth daily.   omega-3 acid ethyl esters 1 g capsule Commonly known as:  LOVAZA TAKE 2 CAPSULES BY MOUTH  TWO TIMES DAILY   ONETOUCH DELICA LANCETS 03U Misc Use to check blood sugar 3 times per day.   VITAMIN C PO Take 2 tablets by mouth 2 (two) times daily.   XARELTO 20 MG Tabs tablet Generic drug:  rivaroxaban TAKE 1 TABLET BY MOUTH AT  BEDTIME          Objective:   Physical Exam BP 132/70 (BP Location: Left Arm, Patient Position: Sitting, Cuff Size: Normal)   Pulse 79   Temp 97.9 F (36.6 C) (Oral)   Resp 14   Ht 6\' 2"  (1.88 m)   Wt 246 lb 6 oz (111.8 kg)   SpO2 97%   BMI 31.63 kg/m  General:   Well developed, well nourished . NAD.  HEENT:  Normocephalic . Face symmetric, atraumatic Lungs:  CTA B Normal respiratory effort, no intercostal retractions, no accessory muscle use. Heart: RRR,  no murmur.  no pretibial edema bilaterally  Abdomen:  Not distended, soft, non-tender. No rebound or rigidity.  Rectal:  External abnormalities: none. Normal sphincter  tone. No rectal masses or tenderness.  Stool brown  Prostate: Left side seems slightly smaller and more frequent, unchanged from 2015. Skin: Not pale. Not jaundice Neurologic:  alert & oriented X3.  Speech normal, gait appropriate for age and unassisted Psych--  Cognition and judgment appear intact.  Cooperative with normal attention span and concentration.  Behavior appropriate. No anxious or depressed appearing.    Assessment & Plan:   Assessment > DM Dr. Dwyane Dee since 07-2014 Hyperlipidemia Hypothyroidism A flutter, ablation 12-2012, then  Paroxysmal Afib, s/p Maze-PFO closure-[place] 02-2016 GERD Depression Obesity Gout  [name], CPAP since 2012  PLAN DM: Due to see Dr. Dwyane Dee, A1c requested in preparation for that visit  Hyperlipidemia: Last LDL is satisfactory on Lipitor, fenofibrate and Lovaza. Hypothyroidism: Due to see Dr. Dwyane Dee, check a TSH in preparation for that visit A flutter: Last seen by cardiology 07-2016, on sinus rhythm, on Xarelto. Gout: On allopurinol, check a CMP and uric acid. No recent events Obesity: Room for improvement on exercise, needs help eating properly. We talk about alternatives and elected to see the bariatric clinic, information provided.  Anemia: Post op, recheck a CBC GERD: Refill medications. Labs to be done at work faxed to me RTC 6 months CPX

## 2016-09-28 NOTE — Patient Instructions (Addendum)
GO TO THE LAB : Get the blood work     GO TO THE FRONT DESK Schedule your next appointment for a  physical exam in 6 months   LABS:  CMP, uric Acid, PSA, A1C TSH

## 2016-09-28 NOTE — Progress Notes (Signed)
Pre visit review using our clinic review tool, if applicable. No additional management support is needed unless otherwise documented below in the visit note. 

## 2016-09-29 NOTE — Assessment & Plan Note (Signed)
DM: Due to see Dr. Dwyane Dee, A1c requested in preparation for that visit  Hyperlipidemia: Last LDL is satisfactory on Lipitor, fenofibrate and Lovaza. Hypothyroidism: Due to see Dr. Dwyane Dee, check a TSH in preparation for that visit A flutter: Last seen by cardiology 07-2016, on sinus rhythm, on Xarelto. Gout: On allopurinol, check a CMP and uric acid. No recent events Obesity: Room for improvement on exercise, needs help eating properly. We talk about alternatives and elected to see the bariatric clinic, information provided.  Anemia: Post op, recheck a CBC GERD: Refill medications. Labs to be done at work faxed to me RTC 6 months CPX

## 2016-10-15 ENCOUNTER — Other Ambulatory Visit: Payer: Self-pay

## 2016-10-15 ENCOUNTER — Telehealth: Payer: Self-pay | Admitting: Endocrinology

## 2016-10-15 MED ORDER — LEVOTHYROXINE SODIUM 200 MCG PO TABS: 200.0000 ug | ORAL_TABLET | Freq: Every day | ORAL | 0 refills | Status: DC

## 2016-10-15 NOTE — Telephone Encounter (Signed)
[facility] [address]

## 2016-10-15 NOTE — Telephone Encounter (Signed)
This medicine has [facility] [facility].

## 2016-10-27 ENCOUNTER — Encounter: Payer: Self-pay | Admitting: Endocrinology

## 2016-10-28 ENCOUNTER — Other Ambulatory Visit: Payer: Self-pay

## 2016-10-29 ENCOUNTER — Other Ambulatory Visit: Payer: Self-pay

## 2016-10-29 ENCOUNTER — Encounter: Payer: Self-pay | Admitting: Internal Medicine

## 2016-10-29 MED ORDER — LEVOTHYROXINE SODIUM 200 MCG PO TABS: 200.0000 ug | ORAL_TABLET | Freq: Every day | ORAL | 0 refills | Status: DC

## 2016-11-08 ENCOUNTER — Encounter: Payer: Self-pay | Admitting: Endocrinology

## 2016-11-08 ENCOUNTER — Ambulatory Visit (INDEPENDENT_AMBULATORY_CARE_PROVIDER_SITE_OTHER): Payer: 59 | Admitting: Endocrinology

## 2016-11-08 VITALS — BP 124/88 | HR 84 | Ht 74.0 in | Wt 249.2 lb

## 2016-11-08 DIAGNOSIS — E1165 Type 2 diabetes mellitus with hyperglycemia: Secondary | ICD-10-CM | POA: Diagnosis not present

## 2016-11-08 DIAGNOSIS — E063 Autoimmune thyroiditis: Secondary | ICD-10-CM

## 2016-11-08 LAB — POCT GLYCOSYLATED HEMOGLOBIN (HGB A1C): HEMOGLOBIN A1C: 6.8

## 2016-11-08 NOTE — Progress Notes (Signed)
Patient ID: Colin Wood, male   DOB: [birth year], 56 y.o.   MRN: [id number]           Reason for Appointment: Follow-up for Type 2 Diabetes  Referring physician: Larose Kells  History of Present Illness:          Diagnosis: Type 2 diabetes mellitus, date of diagnosis: ?  2011        Past history: He was not having any symptoms at the time of diagnosis and not clear what his initial blood sugars were He was started on metformin initially and had fairly good control Subsequently with higher sugars he was given Amaryl also in addition which has been continued.  A1c was 10.6% in 2012 Subsequently in 2014 his A1c was down to 6.5 but he had a regular follow-up subsequently Because of an A1c of 8.1 in 02/2014 he was given Tradjenta in addition to the above drugs but this was not effective  He was referred here because of a high A1c of 7.9 in 07/2014  Recent history:   Oral hypoglycemic drugs the patient is taking are: metformin 1 g twice a day, Amaryl 4 mg in a.m., Invokana 300 mg daily      He has had difficulty controlling his diabetes because of his poor compliance with diet, exercise and glucose monitoring Organ has been irregular with his follow-up  A1c had been 7 or below but now it is higher at 6.7, previously was down to 6.1  Current management, blood sugar values and problems identified:  He says his main difficulty is not being paying attention to his diet and also not trying to find time to exercise  Although his weight is about the same as before his A1c being higher indicates probably sporadic high readings after meals  He has check blood sugars in the morning only at the last 3 days and these are about 135-1 45 range; he thinks his readings are higher in the morning if he goes off the diet the night before  He does not find time to exercise because of working long hours, does not want to exercise in the gym at work because of not being able to shower subsequently  Also on his  last visit he was having tendency to occasional hypoglycemia before lunch but does not feel this now because he thinks that he is eating more snacks and not watching his diet as before  He refuses to consider seeing the dietitian as he thinks he can try to implement meal planning on his own, he thinks he has enough literature to go by  He has not done any readings after lunch or supper  He thinks he is taking medications consistently as directed        Side effects from medications have been:None  Compliance with the medical regimen: Fair   Glucose monitoring:  done occasionally        Glucometer:   One Touch.      Blood Glucose readings by review of monitor: As above   Self-care: The diet that the patient has been following is: None, he has difficulty controlling portions     Meals: 3 meals per day. His breakfast  is usually a biscuit at a fast food restaurant.   He is snacking on apples and yogurt  snacks           Exercise: walking 15 minutes on some days          Dietician visit, most recent:  none, previously had gone to a class          Weight history: Previous range 260-280  Wt Readings from Last 3 Encounters:  11/08/16 249 lb 3.2 oz (113 kg)  09/28/16 246 lb 6 oz (111.8 kg)  08/13/16 249 lb (112.9 kg)    Glycemic control:   Lab Results  Component Value Date   HGBA1C 6.1 (H) 04/27/2016   HGBA1C 7.5 03/02/2016   HGBA1C 7.0 (H) 04/14/2015   Lab Results  Component Value Date   LDLCALC 100 (H) 04/27/2016   CREATININE 1.16 04/27/2016      Allergies as of 11/08/2016   No Known Allergies     Medication List       Accurate as of 11/08/16 10:36 AM. Always use your most recent med list.          allopurinol 300 MG tablet Commonly known as:  ZYLOPRIM Take 1 tablet (300 mg total) by mouth daily.   atorvastatin 10 MG tablet Commonly known as:  LIPITOR TAKE 1 TABLET BY MOUTH  DAILY   canagliflozin 300 MG Tabs tablet Commonly known as:  INVOKANA Take 1 tablet  (300 mg total) by mouth daily before breakfast.   dexlansoprazole 60 MG capsule Commonly known as:  DEXILANT Take 1 capsule (60 mg total) by mouth daily.   famotidine 20 MG tablet Commonly known as:  PEPCID Take 1 tablet (20 mg total) by mouth at bedtime.   fenofibrate 160 MG tablet Take 1 tablet (160 mg total) by mouth daily.   fluticasone 50 MCG/ACT nasal spray Commonly known as:  FLONASE Place 2 sprays into both nostrils daily.   glimepiride 4 MG tablet Commonly known as:  AMARYL TAKE 1 TABLET BY MOUTH  DAILY WITH BREAKFAST   glucose blood test strip Commonly known as:  ONE TOUCH ULTRA TEST Use to check blood sugar 3 times per day   levothyroxine 200 MCG tablet Commonly known as:  SYNTHROID, LEVOTHROID Take 1 tablet (200 mcg total) by mouth daily before breakfast.   lisinopril 5 MG tablet Commonly known as:  PRINIVIL,ZESTRIL TAKE 1 TABLET BY MOUTH  DAILY   metFORMIN 1000 MG tablet Commonly known as:  GLUCOPHAGE TAKE 1 TABLET BY MOUTH TWO  TIMES DAILY WITH A MEAL   metoprolol succinate 25 MG 24 hr tablet Commonly known as:  TOPROL-XL Take 1 tablet (25 mg total) by mouth daily.   multivitamin tablet Take 1 tablet by mouth daily.   omega-3 acid ethyl esters 1 g capsule Commonly known as:  LOVAZA TAKE 2 CAPSULES BY MOUTH  TWO TIMES DAILY   ONETOUCH DELICA LANCETS 97L Misc Use to check blood sugar 3 times per day.   VITAMIN C PO Take 2 tablets by mouth 2 (two) times daily.   XARELTO 20 MG Tabs tablet Generic drug:  rivaroxaban TAKE 1 TABLET BY MOUTH AT  BEDTIME       Allergies:  No Known Allergies  Past Medical History:  Diagnosis Date  . Atrial flutter ([place])    typical appearing; s/p ablation 12-2012  . Depression    used to see psych, on no meds as of 03/2009  . Diabetes mellitus    TYPE 2  . GERD (gastroesophageal reflux disease)   . Gout   . Hyperlipidemia   . Hypothyroidism   . Persistent atrial fibrillation ([place])    PVI 12/2012  .  Rhinitis    vasomotor  . S/P Minimally invasive maze operation for atrial fibrillation 03/12/2016  Complete bilateral atrial lesion set using cryothermy and bipolar radiofrequency ablation with clipping of LA appendage via right mini thoracotomy approach  . S/P patent foramen ovale closure 03/12/2016  . Sleep apnea    mild, now treated with CPAP since ~9-12    Past Surgical History:  Procedure Laterality Date  . ABLATION OF DYSRHYTHMIC FOCUS  01/05/2013   PVI and flutter ablation by [name]  . ATRIAL FIBRILLATION ABLATION N/A 01/05/2013   Procedure: ATRIAL FIBRILLATION ABLATION;  Surgeon: Hink Grayer, MD;  Location: [facility] CATH LAB;  Service: Cardiovascular;  Laterality: N/A;  . CARDIAC CATHETERIZATION N/A 01/12/2016   Procedure: Right/Left Heart Cath and Coronary Angiography;  Surgeon: [name], MD;  Location: [place] CV LAB;  Service: Cardiovascular;  Laterality: N/A;  . CARDIOVERSION  03/01/11  . CLIPPING OF ATRIAL APPENDAGE Left 03/12/2016   Procedure: CLIPPING OF ATRIAL APPENDAGE;  Surgeon: [name], MD;  Location: [place];  Service: Open Heart Surgery;  Laterality: Left;  . MINIMALLY INVASIVE MAZE PROCEDURE N/A 03/12/2016   Procedure: MINIMALLY INVASIVE MAZE PROCEDURE;  Surgeon: [name], MD;  Location: [place];  Service: Open Heart Surgery;  Laterality: N/A;  . NASAL SINUS SURGERY    . TEE WITHOUT CARDIOVERSION N/A 01/04/2013   Procedure: TRANSESOPHAGEAL ECHOCARDIOGRAM (TEE);  Surgeon: [name], MD;  Location: [facility] ENDOSCOPY;  Service: Cardiovascular;  Laterality: N/A;  . TEE WITHOUT CARDIOVERSION N/A 03/12/2016   Procedure: TRANSESOPHAGEAL ECHOCARDIOGRAM (TEE);  Surgeon: [name], MD;  Location: [place];  Service: Open Heart Surgery;  Laterality: N/A;  . TONSILLECTOMY    . VASECTOMY      Family History  Problem Relation Age of Onset  . Asthma Mother   . Diabetes Father   . Hypertension Brother   . Melanoma Brother   . Allergies Daughter   . Throat  cancer Maternal Uncle   . Colon cancer Paternal Grandfather   . Prostate cancer Neg Hx     Social History:  reports that he has never smoked. He has never used smokeless tobacco. He reports that he does not drink alcohol or use drugs.    Review of Systems         Lipids:  has high triglycerides mostly previously as high as 681; LDL previously also consistently high.   On Fenofibrate and taking Lipitor 10 mg daily  Has not had his follow-up fasting labs recently       Lab Results  Component Value Date   CHOL 175 04/27/2016   HDL 50 04/27/2016   LDLCALC 100 (H) 04/27/2016   TRIG 123 04/27/2016   CHOLHDL 3.5 04/27/2016                   Thyroid: He was diagnosed to have hypothyroidism in 1994 has been on medication since then  He is compliant with his Synthroid and Since last year has been on generic No complaints of palpitations However her TSH has been persistently low He was told to reduce the dose down to 175 but is still taking 200 No recent labs available    Lab Results  Component Value Date   TSH 0.271 (L) 06/14/2016   TSH 0.164 (L) 04/27/2016   TSH 2.43 03/30/2016   FREET4 1.78 (H) 06/14/2016       The blood pressure has been Treated with low-dose lisinopril and metoprolol He has not checked at home even though he has a meter  BP Readings from Last 3 Encounters:  11/08/16 124/88  09/28/16 132/70  08/13/16 128/60        Physical Examination:  BP 124/88   Pulse 84   Ht 6\' 2"  (1.88 m)   Wt 249 lb 3.2 oz (113 kg)   SpO2 95%   BMI 32.00 kg/m      Repeat blood pressure unchanged  Diabetic Foot Exam - Simple   Simple Foot Form Diabetic Foot exam was performed with the following findings:  Yes   Visual Inspection No deformities, no ulcerations, no other skin breakdown bilaterally:  Yes Sensation Testing Intact to touch and monofilament testing bilaterally:  Yes Pulse Check Posterior Tibialis and Dorsalis pulse intact bilaterally:   Yes Comments     ASSESSMENT:  Diabetes type 2, uncontrolled with obesity He is on a 3 drug regimen of metformin, Amaryl and Invokana 300 mg  See history of present illness for detailed discussion of his current management, blood sugar patterns and problems identified  He has difficulty being consistent with diet and again has not made efforts to exercise Still having difficulty losing weight Have recommended that he see the dietitian but he does not want to do this and wants to do it on his own Discussed that weight loss option would be lifestyle changes and weight loss medications but needs to change his lifestyle first  He does not monitor his blood sugars much and has no readings after meals  HYPERTRIGLYCERIDEMIA: Needs fasting labs   HYPOTHYROIDISM: Currently TSH has been low, he has not changed his dose and will recheck labs again   PLAN:   Continue same regimen for his diabetes  Start checking readings regularly with some readings fasting, and especially after lunch or supper to help his dietary compliance  May consider GLP-1 drug if he is not able to improve his diet on his own and this was emphasized  Again asked him to call if he wants to see the dietitian  Regular exercise, he can walk in the evenings  He will have his thyroid levels checked with his lab work  Check chemistry and lipids  Follow-up in 3 months  Patient Instructions  Check blood sugars on waking up  2=3/7 days  Also check blood sugars about 2 hours after a meal and do this after different meals by rotation  Recommended blood sugar levels on waking up is 90-130 and about 2 hours after meal is 130-160  Please bring your blood sugar monitor to each visit, thank you  Walk daily       [facility] 11/08/2016, 10:36 AM   Note: This office note was prepared with Dragon voice recognition system technology. Any transcriptional errors that result from this process are unintentional.

## 2016-11-08 NOTE — Patient Instructions (Addendum)
Check blood sugars on waking up  2=3/7 days  Also check blood sugars about 2 hours after a meal and do this after different meals by rotation  Recommended blood sugar levels on waking up is 90-130 and about 2 hours after meal is 130-160  Please bring your blood sugar monitor to each visit, thank you  Walk daily   Check Bp weekly

## 2016-11-09 ENCOUNTER — Ambulatory Visit (HOSPITAL_COMMUNITY)
Admission: RE | Admit: 2016-11-09 | Discharge: 2016-11-09 | Disposition: A | Discharge status: Home / Self Care | Payer: 59 | Source: Ambulatory Visit | Attending: Nurse Practitioner | Admitting: Nurse Practitioner

## 2016-11-09 ENCOUNTER — Encounter (HOSPITAL_COMMUNITY): Payer: Self-pay | Admitting: Nurse Practitioner

## 2016-11-09 VITALS — BP 122/70 | HR 91 | Ht 74.0 in | Wt 251.2 lb

## 2016-11-09 DIAGNOSIS — Z9889 Other specified postprocedural states: Secondary | ICD-10-CM | POA: Insufficient documentation

## 2016-11-09 DIAGNOSIS — M109 Gout, unspecified: Secondary | ICD-10-CM | POA: Insufficient documentation

## 2016-11-09 DIAGNOSIS — Z7901 Long term (current) use of anticoagulants: Secondary | ICD-10-CM | POA: Insufficient documentation

## 2016-11-09 DIAGNOSIS — I481 Persistent atrial fibrillation: Secondary | ICD-10-CM | POA: Diagnosis not present

## 2016-11-09 DIAGNOSIS — I10 Essential (primary) hypertension: Secondary | ICD-10-CM | POA: Insufficient documentation

## 2016-11-09 DIAGNOSIS — F329 Major depressive disorder, single episode, unspecified: Secondary | ICD-10-CM | POA: Insufficient documentation

## 2016-11-09 DIAGNOSIS — E039 Hypothyroidism, unspecified: Secondary | ICD-10-CM | POA: Diagnosis not present

## 2016-11-09 DIAGNOSIS — E119 Type 2 diabetes mellitus without complications: Secondary | ICD-10-CM | POA: Diagnosis not present

## 2016-11-09 DIAGNOSIS — E785 Hyperlipidemia, unspecified: Secondary | ICD-10-CM | POA: Diagnosis not present

## 2016-11-09 DIAGNOSIS — I4892 Unspecified atrial flutter: Secondary | ICD-10-CM | POA: Diagnosis not present

## 2016-11-09 DIAGNOSIS — K219 Gastro-esophageal reflux disease without esophagitis: Secondary | ICD-10-CM | POA: Diagnosis not present

## 2016-11-09 DIAGNOSIS — I4819 Other persistent atrial fibrillation: Secondary | ICD-10-CM

## 2016-11-09 DIAGNOSIS — Z7984 Long term (current) use of oral hypoglycemic drugs: Secondary | ICD-10-CM | POA: Insufficient documentation

## 2016-11-09 DIAGNOSIS — I480 Paroxysmal atrial fibrillation: Secondary | ICD-10-CM | POA: Insufficient documentation

## 2016-11-09 NOTE — Progress Notes (Signed)
Patient ID: Colin Wood, male   DOB: 1961-02-06, 56 y.o.   MRN: [id number]     Primary Care Physician: Colon Branch, MD Referring Physician: Dr. Faythe Ghee is a 56 y.o. male with a h/o PAF. Failing tikosyn in the past and as well as s/p ablation in 2014,who  developed  persistent afib. He had a Maze procedure, with clipping of left atrial appendage, last October. He has not had any afib since the 2nd day of surgery. It took about one month to recover but he is now back to baseline and is feeling much better.  Today, he denies symptoms of palpitations, chest pain, shortness of breath, orthopnea, PND, lower extremity edema, dizziness, presyncope, syncope, or neurologic sequela. The patient is tolerating medications without difficulties and is otherwise without complaint today.   Past Medical History:  Diagnosis Date  . Atrial flutter ([place])    typical appearing; s/p ablation 12-2012  . Depression    used to see psych, on no meds as of 03/2009  . Diabetes mellitus    TYPE 2  . GERD (gastroesophageal reflux disease)   . Gout   . Hyperlipidemia   . Hypothyroidism   . Persistent atrial fibrillation ([place])    PVI 12/2012  . Rhinitis    vasomotor  . S/P Minimally invasive maze operation for atrial fibrillation 03/12/2016   Complete bilateral atrial lesion set using cryothermy and bipolar radiofrequency ablation with clipping of LA appendage via right mini thoracotomy approach  . S/P patent foramen ovale closure 03/12/2016  . Sleep apnea    mild, now treated with CPAP since ~9-12   Past Surgical History:  Procedure Laterality Date  . ABLATION OF DYSRHYTHMIC FOCUS  01/05/2013   PVI and flutter ablation by [name]  . ATRIAL FIBRILLATION ABLATION N/A 01/05/2013   Procedure: ATRIAL FIBRILLATION ABLATION;  Surgeon: Tomich Grayer, MD;  Location: [facility] CATH LAB;  Service: Cardiovascular;  Laterality: N/A;  . CARDIAC CATHETERIZATION N/A 01/12/2016   Procedure: Right/Left Heart  Cath and Coronary Angiography;  Surgeon: [name], MD;  Location: [place] CV LAB;  Service: Cardiovascular;  Laterality: N/A;  . CARDIOVERSION  03/01/11  . CLIPPING OF ATRIAL APPENDAGE Left 03/12/2016   Procedure: CLIPPING OF ATRIAL APPENDAGE;  Surgeon: [name], MD;  Location: [place];  Service: Open Heart Surgery;  Laterality: Left;  . MINIMALLY INVASIVE MAZE PROCEDURE N/A 03/12/2016   Procedure: MINIMALLY INVASIVE MAZE PROCEDURE;  Surgeon: [name], MD;  Location: [place];  Service: Open Heart Surgery;  Laterality: N/A;  . NASAL SINUS SURGERY    . TEE WITHOUT CARDIOVERSION N/A 01/04/2013   Procedure: TRANSESOPHAGEAL ECHOCARDIOGRAM (TEE);  Surgeon: [name], MD;  Location[facility] ENDOSCOPY;  Service: Cardiovascular;  Laterality: N/A;  . TEE WITHOUT CARDIOVERSION N/A 03/12/2016   Procedure: TRANSESOPHAGEAL ECHOCARDIOGRAM (TEE);  Surgeon: [name], MD;  Location: [place];  Service: Open Heart Surgery;  Laterality: N/A;  . TONSILLECTOMY    . VASECTOMY      Current Outpatient Prescriptions  Medication Sig Dispense Refill  . allopurinol (ZYLOPRIM) 300 MG tablet Take 1 tablet (300 mg total) by mouth daily. 90 tablet 3  . Ascorbic Acid (VITAMIN C PO) Take 2 tablets by mouth 2 (two) times daily.    [name] atorvastatin (LIPITOR) 10 MG tablet TAKE 1 TABLET BY MOUTH  DAILY 90 tablet 1  . canagliflozin (INVOKANA) 300 MG TABS tablet Take 1 tablet (300 mg total) by mouth daily  before breakfast. 90 tablet 1  . dexlansoprazole (DEXILANT) 60 MG capsule Take 1 capsule (60 mg total) by mouth daily. 90 capsule 3  . famotidine (PEPCID) 20 MG tablet Take 1 tablet (20 mg total) by mouth at bedtime. 90 tablet 0  . fenofibrate 160 MG tablet Take 1 tablet (160 mg total) by mouth daily. 90 tablet 3  . fluticasone (FLONASE) 50 MCG/ACT nasal spray Place 2 sprays into both nostrils daily. 48 g 3  . glimepiride (AMARYL) 4 MG tablet TAKE 1 TABLET BY MOUTH  DAILY WITH BREAKFAST 90 tablet 1  . glucose  blood (ONE TOUCH ULTRA TEST) test strip Use to check blood sugar 3 times per day 300 each 2  . levothyroxine (SYNTHROID, LEVOTHROID) 200 MCG tablet Take 1 tablet (200 mcg total) by mouth daily before breakfast. 15 tablet 0  . lisinopril (PRINIVIL,ZESTRIL) 5 MG tablet TAKE 1 TABLET BY MOUTH  DAILY 90 tablet 2  . metFORMIN (GLUCOPHAGE) 1000 MG tablet TAKE 1 TABLET BY MOUTH TWO  TIMES DAILY WITH A MEAL 180 tablet 1  . metoprolol succinate (TOPROL-XL) 25 MG 24 hr tablet Take 1 tablet (25 mg total) by mouth daily. 90 tablet 3  . Multiple Vitamin (MULTIVITAMIN) tablet Take 1 tablet by mouth daily.    [name] omega-3 acid ethyl esters (LOVAZA) 1 g capsule TAKE 2 CAPSULES BY MOUTH  TWO TIMES DAILY 360 capsule 1  . ONETOUCH DELICA LANCETS 66Y MISC Use to check blood sugar 3 times per day. 300 each 2  . XARELTO 20 MG TABS tablet TAKE 1 TABLET BY MOUTH AT  BEDTIME 90 tablet 1   No current facility-administered medications for this encounter.     No Known Allergies  Social History   Social History  . Marital status: Married    Spouse name: N/A  . Number of children: 2  . Years of education: N/A   Occupational History  . works at [employer] Topics  . Smoking status: Never Smoker  . Smokeless tobacco: Never Used  . Alcohol use No  . Drug use: No  . Sexual activity: Not on file   Other Topics Concern  . Not on file   Social History Narrative   Pt lives in [place] with spouse and children 1 and 42 y/o.  Works in Engineer, technical sales at [employer].    Family History  Problem Relation Age of Onset  . Asthma Mother   . Diabetes Father   . Hypertension Brother   . Melanoma Brother   . Allergies Daughter   . Throat cancer Maternal Uncle   . Colon cancer Paternal Grandfather   . Prostate cancer Neg Hx     ROS- All systems are reviewed and negative except as per the HPI above  Physical Exam: Vitals:   11/09/16 0831  BP: 122/70  Pulse: 91  Weight: 251 lb 3.2 oz (113.9 kg)  Height:  6\' 2"  (1.88 m)    GEN- The patient is well appearing, alert and oriented x 3 today.   Head- normocephalic, atraumatic Eyes-  Sclera clear, conjunctiva pink Ears- hearing intact Oropharynx- clear Neck- supple, no JVP Lymph- no cervical lymphadenopathy Lungs- Clear to ausculation bilaterally, normal work of breathing Heart-Irregular rate and rhythm, no murmurs, rubs or gallops, PMI not laterally displaced GI- soft, NT, ND, + BS Extremities- no clubbing, cyanosis, or edema MS- no significant deformity or atrophy Skin- no rash or lesion Psych- euthymic mood, full affect Neuro- strength and sensation  are intact  EKG-NSR at 91 bpm, LAD, PR int 166 ms, qrs int 128 ms, qtc 472 ms Epic records reviewed  Assessment and Plan: 1. Persistent  asymptomatic AF s/p maze procedure Maintaining SR since procedure off AAD therapy No changes in medication Continue xarelto with chadsvasc score of 2 (htn, DM)  2. Lifestyle factors Encouraged exercise, weight loss Use cpap  Will see back in one year for long term surveillance of maze procedure  Butch Penny C. Lynton Crescenzo, [facility] [address] [place], [place] [address] [phone]

## 2016-11-10 ENCOUNTER — Other Ambulatory Visit: Payer: Self-pay | Admitting: Endocrinology

## 2016-11-10 DIAGNOSIS — E039 Hypothyroidism, unspecified: Secondary | ICD-10-CM | POA: Diagnosis not present

## 2016-11-11 LAB — MICROALBUMIN / CREATININE URINE RATIO
CREATININE, UR: 87.8 mg/dL
Microalb/Creat Ratio: <3.4 mg/g creat (ref 0.0–30.0)
Microalbumin, Urine: <3 ug/mL

## 2016-11-11 LAB — LIPID PANEL
CHOL/HDL RATIO: 3.9 ratio (ref 0.0–5.0)
Cholesterol, Total: 167 mg/dL (ref 100–199)
HDL: 43 mg/dL (ref >39)
LDL Calculated: 83 mg/dL (ref 0–99)
TRIGLYCERIDES: 203 mg/dL — AB (ref 0–149)
VLDL CHOLESTEROL CAL: 41 mg/dL — AB (ref 5–40)

## 2016-11-11 LAB — TSH: TSH: 0.198 u[IU]/mL — ABNORMAL LOW (ref 0.450–4.500)

## 2016-11-27 ENCOUNTER — Telehealth: Payer: Self-pay | Admitting: Internal Medicine

## 2016-11-27 NOTE — Telephone Encounter (Signed)
At the last OV I requested the following labs: CMP, uric Acid, PSA, A1C TSH .  I have not seen a CMP, PSA or uric acid. Please call the patient and arrange Cannot refill medications without his labs

## 2016-11-29 NOTE — Telephone Encounter (Signed)
[facility] requesting call back. Also, MyChart message sent to Pt.

## 2016-11-30 NOTE — Telephone Encounter (Signed)
Also, letter printed and mailed to Pt.

## 2016-12-03 ENCOUNTER — Other Ambulatory Visit: Payer: Self-pay | Admitting: Endocrinology

## 2016-12-03 ENCOUNTER — Other Ambulatory Visit: Payer: Self-pay | Admitting: Internal Medicine

## 2016-12-17 ENCOUNTER — Other Ambulatory Visit: Payer: Self-pay

## 2016-12-17 MED ORDER — LEVOTHYROXINE SODIUM 200 MCG PO TABS: 200.0000 ug | ORAL_TABLET | Freq: Every day | ORAL | 0 refills | Status: DC

## 2016-12-17 MED ORDER — LEVOTHYROXINE SODIUM 200 MCG PO TABS: 200.0000 ug | ORAL_TABLET | Freq: Every day | ORAL | 1 refills | Status: DC

## 2017-01-07 ENCOUNTER — Telehealth: Payer: Self-pay | Admitting: Internal Medicine

## 2017-01-07 MED ORDER — RIVAROXABAN 20 MG PO TABS: 20.0000 mg | ORAL_TABLET | Freq: Every day | ORAL | 0 refills | Status: DC

## 2017-01-07 NOTE — Telephone Encounter (Signed)
New message    Patient calling the office for samples of medication:   1.  What medication and dosage are you requesting samples for?  Xarelto  20mg   2.  Are you currently out of this medication?  Yes   If you do no have samples coul you call in a 15 day supply til the mail order company can get it to him     [facility] [address] [facility] [place]

## 2017-01-07 NOTE — Telephone Encounter (Signed)
RX sent

## 2017-01-08 ENCOUNTER — Other Ambulatory Visit: Payer: Self-pay | Admitting: Endocrinology

## 2017-01-27 ENCOUNTER — Encounter (INDEPENDENT_AMBULATORY_CARE_PROVIDER_SITE_OTHER): Payer: Self-pay

## 2017-02-07 ENCOUNTER — Ambulatory Visit (INDEPENDENT_AMBULATORY_CARE_PROVIDER_SITE_OTHER): Payer: Self-pay | Admitting: Family Medicine

## 2017-02-07 DIAGNOSIS — Z0289 Encounter for other administrative examinations: Secondary | ICD-10-CM

## 2017-02-10 ENCOUNTER — Encounter (INDEPENDENT_AMBULATORY_CARE_PROVIDER_SITE_OTHER): Payer: Self-pay | Admitting: Family Medicine

## 2017-02-10 ENCOUNTER — Ambulatory Visit (INDEPENDENT_AMBULATORY_CARE_PROVIDER_SITE_OTHER): Payer: 59 | Admitting: Family Medicine

## 2017-02-10 VITALS — BP 113/67 | HR 72 | Temp 98.3°F | Ht 73.0 in | Wt 244.0 lb

## 2017-02-10 DIAGNOSIS — Z9189 Other specified personal risk factors, not elsewhere classified: Secondary | ICD-10-CM

## 2017-02-10 DIAGNOSIS — Z6832 Body mass index (BMI) 32.0-32.9, adult: Secondary | ICD-10-CM | POA: Diagnosis not present

## 2017-02-10 DIAGNOSIS — E038 Other specified hypothyroidism: Secondary | ICD-10-CM

## 2017-02-10 DIAGNOSIS — Z8679 Personal history of other diseases of the circulatory system: Secondary | ICD-10-CM

## 2017-02-10 DIAGNOSIS — E66811 Obesity, class 1: Secondary | ICD-10-CM

## 2017-02-10 DIAGNOSIS — E784 Other hyperlipidemia: Secondary | ICD-10-CM | POA: Diagnosis not present

## 2017-02-10 DIAGNOSIS — E7849 Other hyperlipidemia: Secondary | ICD-10-CM

## 2017-02-10 DIAGNOSIS — R5383 Other fatigue: Secondary | ICD-10-CM | POA: Diagnosis not present

## 2017-02-10 DIAGNOSIS — Z1331 Encounter for screening for depression: Secondary | ICD-10-CM

## 2017-02-10 DIAGNOSIS — I10 Essential (primary) hypertension: Secondary | ICD-10-CM | POA: Diagnosis not present

## 2017-02-10 DIAGNOSIS — E119 Type 2 diabetes mellitus without complications: Secondary | ICD-10-CM | POA: Diagnosis not present

## 2017-02-10 DIAGNOSIS — E669 Obesity, unspecified: Secondary | ICD-10-CM | POA: Diagnosis not present

## 2017-02-10 DIAGNOSIS — Z1389 Encounter for screening for other disorder: Secondary | ICD-10-CM

## 2017-02-10 DIAGNOSIS — R0602 Shortness of breath: Secondary | ICD-10-CM | POA: Diagnosis not present

## 2017-02-10 NOTE — Progress Notes (Signed)
Office: [phone]  /  Fax: [phone]   Dear Dr. Larose Kells,   Thank you for referring Colin Wood to our clinic. The following note includes my evaluation and treatment recommendations.  HPI:   Chief Complaint: OBESITY    Colin Wood has been referred by Colon Branch, MD for consultation regarding his obesity and obesity related comorbidities.    Colin Wood (MR# [id number]) is a 56 y.o. male who presents on 02/10/2017 for obesity evaluation and treatment. Current BMI is Body mass index is 32.19 kg/m.[name] Colin Wood has been struggling with his weight for many years and has been unsuccessful in either losing weight, maintaining weight loss, or reaching his healthy weight goal.     Colin Wood attended our information session and states he is currently in the action stage of change and ready to dedicate time achieving and maintaining a healthier weight. Colin Wood is interested in becoming our patient and working on intensive lifestyle modifications including (but not limited to) diet, exercise and weight loss.  Colin Wood states his family eats together he thinks his family will eat healthier   Colin Wood states his desired weight loss is 56 lbs he has been heavy most of  his life he started gaining weight approximately 56 yrs old his heaviest weight ever was 280 lbs. he has significant food cravings issues  he snacks frequently in the evenings he has binge eating behavior he frequently eats larger portions than normal  he struggles with poor food choices he struggles with emotional eating   Fatigue Colin Wood feels his energy is lower than it should be. This has worsened with weight gain and has not worsened recently. Colin Wood admits to daytime somnolence and  admits to waking up still tired. Patient is at risk for obstructive sleep apnea. Patent has a history of symptoms of daytime fatigue. Patient generally gets 6 or 7 hours of sleep per night, and states they generally have difficulty  falling asleep. Snoring is present. Apneic episodes is present. Epworth Sleepiness Score is 7  Dyspnea on exertion Colin Wood notes increasing shortness of breath with exercising and seems to be worsening over time with weight gain. He notes getting out of breath sooner with activity than he used to. This has not gotten worse recently. Colin Wood denies orthopnea.  Diabetes II Colin Wood has a diagnosis of diabetes type II. Colin Wood states patient does not check BGs at home. He is on metformin, glimepiride, and inokana. He denies any hypoglycemic episodes. He states last A1c was 6.8 on 11/08/16. Hemoglobin A1C 11/08/2016 04/27/2016 03/02/2016  HGBA1C 6.8 6.1(H) 7.5  Some recent data might be hidden    He has been working on intensive lifestyle modifications including diet, exercise, and weight loss to help control his blood glucose levels.  Hypothyroid Colin Wood has a diagnosis of hypothyroidism. He is on levothyroxine. He still notes fatigue and some cold intolerance but he denies palpitations.  Hyperlipidemia Colin Wood has hyperlipidemia and has been trying to improve his cholesterol levels with intensive lifestyle modification including a low saturated fat diet, exercise and weight loss. He is on lipitor. He denies any chest pain, claudication or myalgias.  Hypertension Colin Wood is a 56 y.o. male with hypertension. Colin Wood is lisinopril and metoprolol. He denies chest pain. He is working weight loss to help control his blood pressure with the goal of decreasing his risk of heart attack and stroke. [name]'s blood pressure is currently controlled.  History of Atrial Flutter Colin Wood has a history of atrial flutter and he  is status post ablation. EKG shows right branch bundle block. He is followed by cardiology. Colin Wood denies chest pains or palpitations.  At risk for cardiovascular disease Colin Wood is at a higher than average risk for cardiovascular disease due to obesity, hyperlipidemia,  hypertension, history of atrial flutter, and diabetes. He currently denies any chest pain.  Depression Screen Colin Wood's Food and Mood (modified PHQ-9) score was  Depression screen PHQ 2/9 02/10/2017  Decreased Interest 2  Down, Depressed, Hopeless 1  PHQ - 2 Score 3  Altered sleeping 3  Tired, decreased energy 3  Change in appetite 0  Feeling bad or failure about yourself  2  Trouble concentrating 1  Moving slowly or fidgety/restless 0  Suicidal thoughts 0  PHQ-9 Score 12  Difficult doing work/chores Not difficult at all  Some recent data might be hidden    ALLERGIES: No Known Allergies  MEDICATIONS: Current Outpatient Prescriptions on File Prior to Visit  Medication Sig Dispense Refill  . allopurinol (ZYLOPRIM) 300 MG tablet Take 1 tablet (300 mg total) by mouth daily. 90 tablet 3  . Ascorbic Acid (VITAMIN C PO) Take 2 tablets by mouth 2 (two) times daily.    [name] atorvastatin (LIPITOR) 10 MG tablet TAKE 1 TABLET BY MOUTH  DAILY 90 tablet 0  . canagliflozin (INVOKANA) 300 MG TABS tablet Take 1 tablet (300 mg total) by mouth daily before breakfast. 90 tablet 1  . famotidine (PEPCID) 20 MG tablet Take 1 tablet (20 mg total) by mouth at bedtime. 90 tablet 3  . fenofibrate 160 MG tablet Take 1 tablet (160 mg total) by mouth daily. 90 tablet 3  . glimepiride (AMARYL) 4 MG tablet TAKE 1 TABLET BY MOUTH  DAILY WITH BREAKFAST 90 tablet 1  . glucose blood (ONE TOUCH ULTRA TEST) test strip Use to check blood sugar 3 times per day 300 each 2  . levothyroxine (SYNTHROID, LEVOTHROID) 200 MCG tablet Take 1 tablet (200 mcg total) by mouth daily before breakfast. 90 tablet 1  . lisinopril (PRINIVIL,ZESTRIL) 5 MG tablet TAKE 1 TABLET BY MOUTH  DAILY 90 tablet 2  . metFORMIN (GLUCOPHAGE) 1000 MG tablet TAKE 1 TABLET BY MOUTH TWO  TIMES DAILY WITH A MEAL 180 tablet 1  . metoprolol succinate (TOPROL-XL) 25 MG 24 hr tablet Take 1 tablet (25 mg total) by mouth daily. 90 tablet 3  . Multiple Vitamin  (MULTIVITAMIN) tablet Take 1 tablet by mouth daily.    [name] omega-3 acid ethyl esters (LOVAZA) 1 g capsule TAKE 2 CAPSULES BY MOUTH  TWO TIMES DAILY 360 capsule 1  . ONETOUCH DELICA LANCETS 09N MISC Use to check blood sugar 3 times per day. 300 each 2  . rivaroxaban (XARELTO) 20 MG TABS tablet Take 1 tablet (20 mg total) by mouth daily with supper. 15 tablet 0  . dexlansoprazole (DEXILANT) 60 MG capsule Take 1 capsule (60 mg total) by mouth daily. (Patient not taking: Reported on 02/10/2017) 90 capsule 3  . fluticasone (FLONASE) 50 MCG/ACT nasal spray Place 2 sprays into both nostrils daily. (Patient not taking: Reported on 02/10/2017) 48 g 3   No current facility-administered medications on file prior to visit.     PAST MEDICAL HISTORY: Past Medical History:  Diagnosis Date  . Anxiety   . Atrial flutter ([place])    typical appearing; s/p ablation 12-2012  . Constipation   . Depression    used to see psych, on no meds as of 03/2009  . Diabetes mellitus    TYPE  2  . Fatty liver   . GERD (gastroesophageal reflux disease)   . Gout   . Hyperlipidemia   . Hypothyroidism   . Leg edema   . Obesity   . Persistent atrial fibrillation ([place])    PVI 12/2012  . Prediabetes   . Rhinitis    vasomotor  . S/P Minimally invasive maze operation for atrial fibrillation 03/12/2016   Complete bilateral atrial lesion set using cryothermy and bipolar radiofrequency ablation with clipping of LA appendage via right mini thoracotomy approach  . S/P patent foramen ovale closure 03/12/2016  . Sleep apnea    mild, now treated with CPAP since ~9-12    PAST SURGICAL HISTORY: Past Surgical History:  Procedure Laterality Date  . ABLATION OF DYSRHYTHMIC FOCUS  01/05/2013   PVI and flutter ablation by [name]  . ATRIAL FIBRILLATION ABLATION N/A 01/05/2013   Procedure: ATRIAL FIBRILLATION ABLATION;  Surgeon: Ly Grayer, MD;  Location: [facility] CATH LAB;  Service: Cardiovascular;  Laterality: N/A;  . CARDIAC  CATHETERIZATION N/A 01/12/2016   Procedure: Right/Left Heart Cath and Coronary Angiography;  Surgeon: [name], MD;  Location: [place] CV LAB;  Service: Cardiovascular;  Laterality: N/A;  . CARDIOVERSION  03/01/11  . CLIPPING OF ATRIAL APPENDAGE Left 03/12/2016   Procedure: CLIPPING OF ATRIAL APPENDAGE;  Surgeon: [name], MD;  Location: [place];  Service: Open Heart Surgery;  Laterality: Left;  . MINIMALLY INVASIVE MAZE PROCEDURE N/A 03/12/2016   Procedure: MINIMALLY INVASIVE MAZE PROCEDURE;  Surgeon: [name], MD;  Location: [place];  Service: Open Heart Surgery;  Laterality: N/A;  . NASAL SINUS SURGERY    . TEE WITHOUT CARDIOVERSION N/A 01/04/2013   Procedure: TRANSESOPHAGEAL ECHOCARDIOGRAM (TEE);  Surgeon: [name], MD;  Location[facility] ENDOSCOPY;  Service: Cardiovascular;  Laterality: N/A;  . TEE WITHOUT CARDIOVERSION N/A 03/12/2016   Procedure: TRANSESOPHAGEAL ECHOCARDIOGRAM (TEE);  Surgeon: [name], MD;  Location: [place];  Service: Open Heart Surgery;  Laterality: N/A;  . TONSILLECTOMY    . VASECTOMY      SOCIAL HISTORY: Social History  Substance Use Topics  . Smoking status: Never Smoker  . Smokeless tobacco: Never Used  . Alcohol use No    FAMILY HISTORY: Family History  Problem Relation Age of Onset  . Asthma Mother   . Cancer Mother   . Diabetes Father   . Hyperlipidemia Father   . Heart disease Father   . Obesity Father   . Hypertension Brother   . Melanoma Brother   . Allergies Daughter   . Throat cancer Maternal Uncle   . Colon cancer Paternal Grandfather   . Prostate cancer Neg Hx     ROS: Review of Systems  Constitutional: Positive for malaise/fatigue.  Eyes:       Wear glasses or contacts  Respiratory: Positive for shortness of breath (on exertion).   Cardiovascular: Negative for chest pain, palpitations, orthopnea and claudication.       Leg cramping  Genitourinary: Positive for frequency.  Musculoskeletal: Negative for  myalgias.  Endo/Heme/Allergies:       Negative hypoglycemia Positive cold intolerance  Psychiatric/Behavioral: The patient has insomnia.     PHYSICAL EXAM: Blood pressure 113/67, pulse 72, temperature 98.3 F (36.8 C), temperature source Oral, height 6\' 1"  (1.854 m), weight 244 lb (110.7 kg), SpO2 98 %. Body mass index is 32.19 kg/m. Physical Exam  Constitutional: He is oriented to person, place, and time. He appears well-developed and well-nourished.  Cardiovascular: Normal  rate.   Pulmonary/Chest: Effort normal.  Musculoskeletal: Normal range of motion.  Neurological: He is oriented to person, place, and time.  Skin: Skin is warm and dry.  Psychiatric: He has a normal mood and affect. His behavior is normal.  Vitals reviewed.   RECENT LABS AND TESTS: BMET    Component Value Date/Time   NA 144 04/27/2016 0747   K 4.6 04/27/2016 0747   CL 100 04/27/2016 0747   CO2 24 04/27/2016 0747   GLUCOSE 103 (H) 04/27/2016 0747   GLUCOSE 103 (H) 03/17/2016 0312   GLUCOSE 154 04/01/2009 0000   BUN 18 04/27/2016 0747   CREATININE 1.16 04/27/2016 0747   CALCIUM 9.8 06/14/2016 0727   GFRNONAA 70 04/27/2016 0747   GFRAA 81 04/27/2016 0747   Lab Results  Component Value Date   HGBA1C 6.8 11/08/2016   No results found for: INSULIN CBC    Component Value Date/Time   WBC 7.6 03/30/2016   WBC 7.5 03/16/2016 0439   RBC 3.85 (L) 03/16/2016 0439   HGB 11.0 (A) 03/30/2016   HGB 15.5 01/05/2016 0815   HCT 33 (A) 03/30/2016   HCT 44.4 01/05/2016 0815   PLT 542 (A) 03/30/2016   PLT 192 01/05/2016 0815   MCV 83.1 03/16/2016 0439   MCV 85 01/05/2016 0815   MCH 29.1 03/16/2016 0439   MCHC 35.0 03/16/2016 0439   RDW 13.4 03/16/2016 0439   RDW 13.6 01/05/2016 0815   LYMPHSABS 2.0 01/05/2016 0815   EOSABS 0.1 01/05/2016 0815   BASOSABS 0.0 01/05/2016 0815   Iron/TIBC/Ferritin/ %Sat No results found for: IRON, TIBC, FERRITIN, IRONPCTSAT Lipid Panel     Component Value Date/Time    CHOL 167 11/10/2016 0945   TRIG 203 (H) 11/10/2016 0945   HDL 43 11/10/2016 0945   CHOLHDL 3.9 11/10/2016 0945   LDLCALC 83 11/10/2016 0945   Hepatic Function Panel     Component Value Date/Time   PROT 7.5 04/27/2016 0747   ALBUMIN 5.0 04/27/2016 0747   AST 18 04/27/2016 0747   ALT 22 04/27/2016 0747   ALKPHOS 50 04/27/2016 0747   BILITOT 0.4 04/27/2016 0747      Component Value Date/Time   TSH 0.198 (L) 11/10/2016 0945   TSH 0.271 (L) 06/14/2016 0727   TSH 0.164 (L) 04/27/2016 0747    ECG  shows NSR with a rate of 74 INDIRECT CALORIMETER done today shows a VO2 of 395 and a REE of 2754.  His calculated basal metabolic rate is 2703 thus his basal metabolic rate is better than expected.    ASSESSMENT AND PLAN: Other fatigue - Plan: EKG 12-Lead, Vitamin B12, CBC With Differential, Folate, VITAMIN D 25 Hydroxy (Vit-D Deficiency, Fractures)  Shortness of breath on exertion  Type 2 diabetes mellitus without complication, without long-term current use of insulin (HCC) - Plan: Comprehensive metabolic panel, Hemoglobin A1c, Insulin, random, Microalbumin / creatinine urine ratio  Other specified hypothyroidism - Plan: T3, T4, free, TSH  History of atrial flutter  Other hyperlipidemia - Plan: Lipid Panel With LDL/HDL Ratio  Essential hypertension  Depression screening  At risk for heart disease  Class 1 obesity with serious comorbidity and body mass index (BMI) of 32.0 to 32.9 in adult, unspecified obesity type  PLAN:  Fatigue Brentyn was informed that his fatigue may be related to obesity, depression or many other causes. Labs will be ordered, and in the meanwhile Colin Wood has agreed to work on diet, exercise and weight loss to help with fatigue.  Proper sleep hygiene was discussed including the need for 7-8 hours of quality sleep each night. A sleep study was not ordered based on symptoms and Epworth score.  Dyspnea on exertion Sergey's shortness of breath appears to be  obesity related and exercise induced. He has agreed to work on weight loss and gradually increase exercise to treat his exercise induced shortness of breath. If Zymarion follows our instructions and loses weight without improvement of his shortness of breath, we will plan to refer to pulmonology. We will monitor this condition regularly. Colin Wood agrees to this plan.  Diabetes II Colin Wood has been given extensive diabetes education by myself today including ideal fasting and post-prandial blood glucose readings, individual ideal HgA1c goals  and hypoglycemia prevention. We discussed the importance of good blood sugar control to decrease the likelihood of diabetic complications such as nephropathy, neuropathy, limb loss, blindness, coronary artery disease, and death. We discussed the importance of intensive lifestyle modification including diet, exercise and weight loss as the first line treatment for diabetes. Stony agrees to work on diet, exercise, and weight loss and continue taking his diabetes medications as prescribed and he will follow up with our clinic in 2 weeks.  Hypothyroid Kue was informed of the importance of good thyroid control to help with weight loss efforts. He was also informed that supertheraputic thyroid levels are dangerous and will not improve weight loss results. Colin Wood agrees to continue taking his medications as prescribed. We will check labs and he will follow up with our clinic in 2 weeks.  Hyperlipidemia Colin Wood was informed of the American Heart Association Guidelines emphasizing intensive lifestyle modifications as the first line treatment for hyperlipidemia. We discussed many lifestyle modifications today in depth, and Domnique will continue to work on decreasing saturated fats such as fatty red meat, butter and many fried foods. He will also increase vegetables and lean protein in his diet and continue to work on exercise and weight loss efforts. Colin Wood agrees to  continue taking his medications as prescribed. We will check labs and he will follow up with our clinic in 2 weeks.  Hypertension We discussed sodium restriction, working on healthy weight loss, diet, and a regular exercise program as the means to achieve improved blood pressure control. Colin Wood agreed with this plan and agreed to follow up as directed. We will continue to monitor his blood pressure as well as his progress with the above lifestyle modifications. Kelvis agrees to continue taking his medications as prescribed and will watch for signs of hypotension as he continues his lifestyle modifications. We will check labs and he will follow up with our clinic in 2 weeks.  History of Atrial Flutter Brookes agrees to work on diet, exercise, and weight loss and will follow up with our clinic in 2 weeks.  Cardiovascular risk counselling Keilyn was given extended (15 minutes) coronary artery disease prevention counseling today. He is 56 y.o. male and has risk factors for heart disease including obesity, hyperlipidemia, hypertension, history of atrial flutter, and diabetes. We discussed intensive lifestyle modifications today with an emphasis on specific weight loss instructions and strategies. Pt was also informed of the importance of increasing exercise and decreasing saturated fats to help prevent heart disease.  Depression Screen Brynden had a moderately positive depression screening. Depression is commonly associated with obesity and often results in emotional eating behaviors. We will monitor this closely and work on CBT to help improve the non-hunger eating patterns. Referral to Psychology may be required if no improvement is  seen as he continues in our clinic.   Obesity Wacey is currently in the action stage of change and his goal is to continue with weight loss efforts. I recommend Westley begin the structured treatment plan as follows:  He has agreed to follow the Category 3 plan + 200  calories Jantz has been instructed to eventually work up to a goal of 150 minutes of combined cardio and strengthening exercise per week for weight loss and overall health benefits. We discussed the following Behavioral Modification Strategies today: increasing lean protein intake, decreasing simple carbohydrates, and work on meal planning and easy cooking plans   He was informed of the importance of frequent follow up visits to maximize his success with intensive lifestyle modifications for his multiple health conditions. He was informed we would discuss his lab results at his next visit unless there is a critical issue that needs to be addressed sooner. Tommey agreed to keep his next visit at the agreed upon time to discuss these results.  I, Trixie Dredge, am acting as transcriptionist for  Dennard Nip, MD  I have reviewed the above documentation for accuracy and completeness, and I agree with the above. -Dennard Nip, MD     OBESITY BEHAVIORAL INTERVENTION VISIT  Today's visit was # 1 out of 22.  Starting weight: 244 lbs Starting date: 02/10/17 Today's weight: 244 lbs  Today's date: 02/10/17 Total lbs lost to date: 0 (Patients must lose 7 lbs in the first 6 months to continue with counseling)   ASK: We discussed the diagnosis of obesity with Loel Lofty today and Colin Wood agreed to give [place] permission to discuss obesity behavioral modification therapy today.  ASSESS: Dominico has the diagnosis of obesity and his BMI today is 32.2 Draysen is in the action stage of change   ADVISE: Meziah was educated on the multiple health risks of obesity as well as the benefit of weight loss to improve his health. He was advised of the need for long term treatment and the importance of lifestyle modifications.  AGREE: Multiple dietary modification options and treatment options were discussed and  Kupono agreed to follow the Category 3 plan + 200 calories  We discussed the  following Behavioral Modification Strategies today: increasing lean protein intake, decreasing simple carbohydrates, and work on meal planning and easy cooking plans

## 2017-02-11 LAB — COMPREHENSIVE METABOLIC PANEL
ALT: 32 IU/L (ref 0–44)
AST: 22 IU/L (ref 0–40)
Albumin/Globulin Ratio: 2.8 — ABNORMAL HIGH (ref 1.2–2.2)
Albumin: 5.4 g/dL (ref 3.5–5.5)
Alkaline Phosphatase: 43 IU/L (ref 39–117)
BILIRUBIN TOTAL: 0.5 mg/dL (ref 0.0–1.2)
BUN/Creatinine Ratio: 16 (ref 9–20)
BUN: 15 mg/dL (ref 6–24)
CO2: 21 mmol/L (ref 20–29)
CREATININE: 0.91 mg/dL (ref 0.76–1.27)
Calcium: 10.2 mg/dL (ref 8.7–10.2)
Chloride: 102 mmol/L (ref 96–106)
GFR calc Af Amer: 108 mL/min/{1.73_m2} (ref >59)
GFR, EST NON AFRICAN AMERICAN: 93 mL/min/{1.73_m2} (ref >59)
GLOBULIN, TOTAL: 1.9 g/dL (ref 1.5–4.5)
GLUCOSE: 100 mg/dL — AB (ref 65–99)
POTASSIUM: 4.6 mmol/L (ref 3.5–5.2)
Sodium: 143 mmol/L (ref 134–144)
TOTAL PROTEIN: 7.3 g/dL (ref 6.0–8.5)

## 2017-02-11 LAB — CBC WITH DIFFERENTIAL
BASOS ABS: 0 10*3/uL (ref 0.0–0.2)
Basos: 0 %
EOS (ABSOLUTE): 0.1 10*3/uL (ref 0.0–0.4)
EOS: 2 %
HEMATOCRIT: 43 % (ref 37.5–51.0)
HEMOGLOBIN: 15.1 g/dL (ref 13.0–17.7)
IMMATURE GRANS (ABS): 0 10*3/uL (ref 0.0–0.1)
Immature Granulocytes: 0 %
LYMPHS: 36 %
Lymphocytes Absolute: 2.2 10*3/uL (ref 0.7–3.1)
MCH: 28.9 pg (ref 26.6–33.0)
MCHC: 35.1 g/dL (ref 31.5–35.7)
MCV: 82 fL (ref 79–97)
Monocytes Absolute: 0.5 10*3/uL (ref 0.1–0.9)
Monocytes: 8 %
NEUTROS ABS: 3.3 10*3/uL (ref 1.4–7.0)
Neutrophils: 54 %
RBC: 5.22 x10E6/uL (ref 4.14–5.80)
RDW: 13.5 % (ref 12.3–15.4)
WBC: 6.1 10*3/uL (ref 3.4–10.8)

## 2017-02-11 LAB — MICROALBUMIN / CREATININE URINE RATIO
Creatinine, Urine: 90 mg/dL
Microalbumin, Urine: <3 ug/mL

## 2017-02-11 LAB — LIPID PANEL WITH LDL/HDL RATIO
Cholesterol, Total: 171 mg/dL (ref 100–199)
HDL: 47 mg/dL (ref >39)
LDL Calculated: 104 mg/dL — ABNORMAL HIGH (ref 0–99)
LDL/HDL RATIO: 2.2 ratio (ref 0.0–3.6)
TRIGLYCERIDES: 102 mg/dL (ref 0–149)
VLDL Cholesterol Cal: 20 mg/dL (ref 5–40)

## 2017-02-11 LAB — INSULIN, RANDOM: INSULIN: 15.6 u[IU]/mL (ref 2.6–24.9)

## 2017-02-11 LAB — T4, FREE: Free T4: 1.93 ng/dL — ABNORMAL HIGH (ref 0.82–1.77)

## 2017-02-11 LAB — T3: T3, Total: 93 ng/dL (ref 71–180)

## 2017-02-11 LAB — VITAMIN B12: Vitamin B-12: 656 pg/mL (ref 232–1245)

## 2017-02-11 LAB — FOLATE: Folate: >20 ng/mL (ref >3.0)

## 2017-02-11 LAB — HEMOGLOBIN A1C
Est. average glucose Bld gHb Est-mCnc: 143 mg/dL
Hgb A1c MFr Bld: 6.6 % — ABNORMAL HIGH (ref 4.8–5.6)

## 2017-02-11 LAB — TSH: TSH: 0.208 u[IU]/mL — AB (ref 0.450–4.500)

## 2017-02-11 LAB — VITAMIN D 25 HYDROXY (VIT D DEFICIENCY, FRACTURES): VIT D 25 HYDROXY: 29.8 ng/mL — AB (ref 30.0–100.0)

## 2017-03-02 ENCOUNTER — Ambulatory Visit (INDEPENDENT_AMBULATORY_CARE_PROVIDER_SITE_OTHER): Payer: 59 | Admitting: Family Medicine

## 2017-03-02 VITALS — BP 112/71 | HR 70 | Temp 98.1°F | Ht 73.0 in | Wt 242.0 lb

## 2017-03-02 DIAGNOSIS — E669 Obesity, unspecified: Secondary | ICD-10-CM

## 2017-03-02 DIAGNOSIS — E559 Vitamin D deficiency, unspecified: Secondary | ICD-10-CM | POA: Diagnosis not present

## 2017-03-02 DIAGNOSIS — Z9189 Other specified personal risk factors, not elsewhere classified: Secondary | ICD-10-CM | POA: Diagnosis not present

## 2017-03-02 DIAGNOSIS — F3289 Other specified depressive episodes: Secondary | ICD-10-CM

## 2017-03-02 DIAGNOSIS — E119 Type 2 diabetes mellitus without complications: Secondary | ICD-10-CM | POA: Diagnosis not present

## 2017-03-02 DIAGNOSIS — Z6832 Body mass index (BMI) 32.0-32.9, adult: Secondary | ICD-10-CM | POA: Diagnosis not present

## 2017-03-02 DIAGNOSIS — E038 Other specified hypothyroidism: Secondary | ICD-10-CM

## 2017-03-02 MED ORDER — VITAMIN D (ERGOCALCIFEROL) 1.25 MG (50000 UNIT) PO CAPS: 50000.0000 [IU] | ORAL_CAPSULE | ORAL | 0 refills | Status: DC

## 2017-03-02 MED ORDER — LEVOTHYROXINE SODIUM 175 MCG PO TABS: 175.0000 ug | ORAL_TABLET | Freq: Every day | ORAL | 0 refills | Status: DC

## 2017-03-02 MED ORDER — BUPROPION HCL ER (SR) 150 MG PO TB12: 150.0000 mg | ORAL_TABLET | Freq: Every day | ORAL | 0 refills | Status: DC

## 2017-03-02 NOTE — Progress Notes (Signed)
Office: [phone]  /  Fax: [phone]   HPI:   Chief Complaint: OBESITY Colin Wood is here to discuss his progress with his obesity treatment plan. He is on the Category 3 plan and is following his eating plan approximately 25 % of the time. He states he is exercising 0 minutes 0 times per week. Colin Wood states he struggled with the category 3 plan. He had increased emotional eating with increased stress and struggled with meal planning. Colin Wood still was able to lose 2 pounds. His weight is 242 lb (109.8 kg) today and has had a weight loss of 2 pounds over a period of 3 weeks since his last visit. He has lost 2 lbs since starting treatment with [place].  Vitamin D deficiency Colin Wood has a new diagnosis of vitamin D deficiency. He is not currently taking vit D and admits fatigue but denies nausea, vomiting or muscle weakness.  Hypothyroid Colin Wood has a diagnosis of hypothyroidism. He is over-replaced on levothyroxine at 200 mcg. He denies hot or cold intolerance or palpitations, but does have a history of Afib. Colin Wood denies chest pain.  Diabetes II Colin Wood has a diagnosis of diabetes type II. Colin Wood states BGs range between 70 and 226 and denies any hypoglycemic episodes but does admit polyphagia and polyuria. Last A1c was improved to 6.6 on Invokana, glimepiride and metformin. He has been working on intensive lifestyle modifications including diet, exercise, and weight loss to help control his blood glucose levels.  At risk for cardiovascular disease Colin Wood is at a higher than average risk for cardiovascular disease due to obesity and diabetes. He currently denies any chest pain.  Depression with emotional eating behaviors Colin Wood struggles with increased stress and emotional eating, worse in the evening and he is frustrated that he can't seem to control this. Colin Wood struggles with emotional eating and using food for comfort to the extent that it is negatively impacting his health. He  often snacks when he is not hungry. Colin Wood sometimes feels he is out of control and then feels guilty that he made poor food choices. He has been working on behavior modification techniques to help reduce his emotional eating and has been somewhat successful. He shows no sign of suicidal or homicidal ideations.  Depression screen Colin Wood 2/9 02/10/2017 12/20/2014  Decreased Interest 2 0  Down, Depressed, Hopeless 1 0  PHQ - 2 Score 3 0  Altered sleeping 3 -  Tired, decreased energy 3 -  Change in appetite 0 -  Feeling bad or failure about yourself  2 -  Trouble concentrating 1 -  Moving slowly or fidgety/restless 0 -  Suicidal thoughts 0 -  PHQ-9 Score 12 -  Difficult doing work/chores Not difficult at all -  Some recent data might be hidden     ALLERGIES: No Known Allergies  MEDICATIONS: Current Outpatient Prescriptions on File Prior to Visit  Medication Sig Dispense Refill  . allopurinol (ZYLOPRIM) 300 MG tablet Take 1 tablet (300 mg total) by mouth daily. 90 tablet 3  . Ascorbic Acid (VITAMIN C PO) Take 2 tablets by mouth 2 (two) times daily.    [name] atorvastatin (LIPITOR) 10 MG tablet TAKE 1 TABLET BY MOUTH  DAILY 90 tablet 0  . canagliflozin (INVOKANA) 300 MG TABS tablet Take 1 tablet (300 mg total) by mouth daily before breakfast. 90 tablet 1  . docusate sodium (COLACE) 100 MG capsule Take 100 mg by mouth daily.    . famotidine (PEPCID) 20 MG tablet Take 1 tablet (20  mg total) by mouth at bedtime. 90 tablet 3  . fenofibrate 160 MG tablet Take 1 tablet (160 mg total) by mouth daily. 90 tablet 3  . glimepiride (AMARYL) 4 MG tablet TAKE 1 TABLET BY MOUTH  DAILY WITH BREAKFAST (Patient taking differently: Take one tab twice daily of 2 mg) 90 tablet 1  . glucose blood (ONE TOUCH ULTRA TEST) test strip Use to check blood sugar 3 times per day 300 each 2  . lisinopril (PRINIVIL,ZESTRIL) 5 MG tablet TAKE 1 TABLET BY MOUTH  DAILY 90 tablet 2  . metFORMIN (GLUCOPHAGE) 1000 MG tablet TAKE 1  TABLET BY MOUTH TWO  TIMES DAILY WITH A MEAL 180 tablet 1  . metoprolol succinate (TOPROL-XL) 25 MG 24 hr tablet Take 1 tablet (25 mg total) by mouth daily. 90 tablet 3  . Multiple Vitamin (MULTIVITAMIN) tablet Take 1 tablet by mouth daily.    [name] omega-3 acid ethyl esters (LOVAZA) 1 g capsule TAKE 2 CAPSULES BY MOUTH  TWO TIMES DAILY 360 capsule 1  . ONETOUCH DELICA LANCETS 45W MISC Use to check blood sugar 3 times per day. 300 each 2  . rivaroxaban (XARELTO) 20 MG TABS tablet Take 1 tablet (20 mg total) by mouth daily with supper. 15 tablet 0   No current facility-administered medications on file prior to visit.     PAST MEDICAL HISTORY: Past Medical History:  Diagnosis Date  . Anxiety   . Atrial flutter ([place])    typical appearing; s/p ablation 12-2012  . Constipation   . Depression    used to see psych, on no meds as of 03/2009  . Diabetes mellitus    TYPE 2  . Fatty liver   . GERD (gastroesophageal reflux disease)   . Gout   . Hyperlipidemia   . Hypothyroidism   . Leg edema   . Obesity   . Persistent atrial fibrillation ([place])    PVI 12/2012  . Prediabetes   . Rhinitis    vasomotor  . S/P Minimally invasive maze operation for atrial fibrillation 03/12/2016   Complete bilateral atrial lesion set using cryothermy and bipolar radiofrequency ablation with clipping of LA appendage via right mini thoracotomy approach  . S/P patent foramen ovale closure 03/12/2016  . Sleep apnea    mild, now treated with CPAP since ~9-12    PAST SURGICAL HISTORY: Past Surgical History:  Procedure Laterality Date  . ABLATION OF DYSRHYTHMIC FOCUS  01/05/2013   PVI and flutter ablation by [name]  . ATRIAL FIBRILLATION ABLATION N/A 01/05/2013   Procedure: ATRIAL FIBRILLATION ABLATION;  Surgeon: Liskey Grayer, MD;  Location: [facility] CATH LAB;  Service: Cardiovascular;  Laterality: N/A;  . CARDIAC CATHETERIZATION N/A 01/12/2016   Procedure: Right/Left Heart Cath and Coronary Angiography;  Surgeon:  [name], MD;  Location: [facility] CV LAB;  Service: Cardiovascular;  Laterality: N/A;  . CARDIOVERSION  03/01/11  . CLIPPING OF ATRIAL APPENDAGE Left 03/12/2016   Procedure: CLIPPING OF ATRIAL APPENDAGE;  Surgeon: [name], MD;  Location: [facility];  Service: Open Heart Surgery;  Laterality: Left;  . MINIMALLY INVASIVE MAZE PROCEDURE N/A 03/12/2016   Procedure: MINIMALLY INVASIVE MAZE PROCEDURE;  Surgeon: [name], MD;  Location: [facility];  Service: Open Heart Surgery;  Laterality: N/A;  . NASAL SINUS SURGERY    . TEE WITHOUT CARDIOVERSION N/A 01/04/2013   Procedure: TRANSESOPHAGEAL ECHOCARDIOGRAM (TEE);  Surgeon: [name], MD;  Location: [facility];  Service: Cardiovascular;  Laterality: N/A;  .  TEE WITHOUT CARDIOVERSION N/A 03/12/2016   Procedure: TRANSESOPHAGEAL ECHOCARDIOGRAM (TEE);  Surgeon: [name], MD;  Location: [facility];  Service: Open Heart Surgery;  Laterality: N/A;  . TONSILLECTOMY    . VASECTOMY      SOCIAL HISTORY: Social History  Substance Use Topics  . Smoking status: Never Smoker  . Smokeless tobacco: Never Used  . Alcohol use No    FAMILY HISTORY: Family History  Problem Relation Age of Onset  . Asthma Mother   . Cancer Mother   . Diabetes Father   . Hyperlipidemia Father   . Heart disease Father   . Obesity Father   . Hypertension Brother   . Melanoma Brother   . Allergies Daughter   . Throat cancer Maternal Uncle   . Colon cancer Paternal Grandfather   . Prostate cancer Neg Hx     ROS: Review of Systems  Constitutional: Positive for malaise/fatigue and weight loss.  Cardiovascular: Negative for chest pain and palpitations.  Gastrointestinal: Negative for nausea and vomiting.  Genitourinary: Negative for frequency.  Musculoskeletal:       Negative muscle weakness  Endo/Heme/Allergies:       Negative Heat/Cold Intolerance Positive polyphagia Negative hypoglycemia  Psychiatric/Behavioral: Positive for depression. Negative  for suicidal ideas.    PHYSICAL EXAM: Blood pressure 112/71, pulse 70, temperature 98.1 F (36.7 C), temperature source Oral, height 6\' 1"  (1.854 m), weight 242 lb (109.8 kg), SpO2 96 %. Body mass index is 31.93 kg/m. Physical Exam  Constitutional: He is oriented to person, place, and time. He appears well-developed and well-nourished.  Cardiovascular: Normal rate.   Pulmonary/Chest: Effort normal.  Musculoskeletal: Normal range of motion.  Neurological: He is oriented to person, place, and time.  Skin: Skin is warm and dry.  Psychiatric: He has a normal mood and affect. His behavior is normal.  Vitals reviewed.   RECENT LABS AND TESTS: BMET    Component Value Date/Time   NA 143 02/10/2017 1131   K 4.6 02/10/2017 1131   CL 102 02/10/2017 1131   CO2 21 02/10/2017 1131   GLUCOSE 100 (H) 02/10/2017 1131   GLUCOSE 103 (H) 03/17/2016 0312   GLUCOSE 154 04/01/2009 0000   BUN 15 02/10/2017 1131   CREATININE 0.91 02/10/2017 1131   CALCIUM 10.2 02/10/2017 1131   GFRNONAA 93 02/10/2017 1131   GFRAA 108 02/10/2017 1131   Lab Results  Component Value Date   HGBA1C 6.6 (H) 02/10/2017   HGBA1C 6.8 11/08/2016   HGBA1C 6.1 (H) 04/27/2016   HGBA1C 7.5 03/02/2016   HGBA1C 7.0 (H) 04/14/2015   Lab Results  Component Value Date   INSULIN 15.6 02/10/2017   CBC    Component Value Date/Time   WBC 6.1 02/10/2017 1131   WBC 7.5 03/16/2016 0439   RBC 5.22 02/10/2017 1131   RBC 3.85 (L) 03/16/2016 0439   HGB 15.1 02/10/2017 1131   HCT 43.0 02/10/2017 1131   PLT 542 (A) 03/30/2016   PLT 192 01/05/2016 0815   MCV 82 02/10/2017 1131   MCH 28.9 02/10/2017 1131   MCH 29.1 03/16/2016 0439   MCHC 35.1 02/10/2017 1131   MCHC 35.0 03/16/2016 0439   RDW 13.5 02/10/2017 1131   LYMPHSABS 2.2 02/10/2017 1131   EOSABS 0.1 02/10/2017 1131   BASOSABS 0.0 02/10/2017 1131   Iron/TIBC/Ferritin/ %Sat No results found for: IRON, TIBC, FERRITIN, IRONPCTSAT Lipid Panel     Component Value  Date/Time   CHOL 171 02/10/2017 1131   TRIG 102  02/10/2017 1131   HDL 47 02/10/2017 1131   CHOLHDL 3.9 11/10/2016 0945   LDLCALC 104 (H) 02/10/2017 1131   Hepatic Function Panel     Component Value Date/Time   PROT 7.3 02/10/2017 1131   ALBUMIN 5.4 02/10/2017 1131   AST 22 02/10/2017 1131   ALT 32 02/10/2017 1131   ALKPHOS 43 02/10/2017 1131   BILITOT 0.5 02/10/2017 1131      Component Value Date/Time   TSH 0.208 (L) 02/10/2017 1131   TSH 0.198 (L) 11/10/2016 0945   TSH 0.271 (L) 06/14/2016 0727    ASSESSMENT AND PLAN: Vitamin D deficiency - Plan: Vitamin D, Ergocalciferol, (DRISDOL) 50000 units CAPS capsule  Other specified hypothyroidism - Plan: levothyroxine (SYNTHROID, LEVOTHROID) 175 MCG tablet  Type 2 diabetes mellitus without complication, without long-term current use of insulin (HCC)  Other depression - with emotional eating - Plan: buPROPion (WELLBUTRIN SR) 150 MG 12 hr tablet  At risk for heart disease  Class 1 obesity with serious comorbidity and body mass index (BMI) of 32.0 to 32.9 in adult, unspecified obesity type  PLAN:  Vitamin D Deficiency Colin Wood was informed that low vitamin D levels contributes to fatigue and are associated with obesity, breast, and colon cancer. He agrees to start to take prescription Vit D @50 ,000 IU every week #4 with no refills and will follow up for routine testing of vitamin D, at least 2-3 times per year. He was informed of the risk of over-replacement of vitamin D and agrees to not increase his dose unless he discusses this with [place] first. Colin Wood agrees to follow up with our clinic in 2 to 3 weeks.  Hypothyroid Colin Wood was informed of the importance of good thyroid control to help with weight loss efforts. He was also informed that supertheraputic thyroid levels are dangerous and will not improve weight loss results. We will recheck labs in 6 weeks and Colin Wood agrees to decrease levothyroxine to 175 mcg qd #30 with no refills  and will follow up with our clinic in 2 to 3 weeks.  Diabetes II Colin Wood has been given extensive diabetes education by myself today including ideal fasting and post-prandial blood glucose readings, individual ideal Hgb A1c goals  and hypoglycemia prevention. We discussed the importance of good blood sugar control to decrease the likelihood of diabetic complications such as nephropathy, neuropathy, limb loss, blindness, coronary artery disease, and death. We discussed the importance of intensive lifestyle modification including diet, exercise and weight loss as the first line treatment for diabetes. Colin Wood agrees to change glimepiride to 2 mg bid (no refill needed) and will continue other medications as prescribed. He will get back to diet with the goal to discontinue Glimepiride and change Invokana to a GLP-1 eventually. Colin Wood agrees to follow up at the agreed upon time.  Cardiovascular risk counseling Colin Wood was given extended (30 minutes) coronary artery disease prevention counseling today. He is 56 y.o. male and has risk factors for heart disease including obesity and diabetes. We discussed intensive lifestyle modifications today with an emphasis on specific weight loss instructions and strategies. Pt was also informed of the importance of increasing exercise and decreasing saturated fats to help prevent heart disease.  Depression with Emotional Eating Behaviors We discussed behavior modification techniques today to help Colin Wood deal with his emotional eating and depression. He has agreed to start Wellbutrin SR 150 mg qam #30 with no refills and will follow up as directed.  Obesity Colin Wood is currently in the action stage of change.  As such, his goal is to continue with weight loss efforts He has agreed to follow the Category 3 plan Colin Wood has been instructed to work up to a goal of 150 minutes of combined cardio and strengthening exercise per week for weight loss and overall health  benefits. We discussed the following Behavioral Modification Strategies today: keeping healthy foods in the home, increasing lean protein intake, work on meal planning and easy cooking plans and emotional eating strategies  Colin Wood has agreed to follow up with our clinic in 2 to 3 weeks. He was informed of the importance of frequent follow up visits to maximize his success with intensive lifestyle modifications for his multiple health conditions.  I, Doreene Nest, am acting as transcriptionist for Dennard Nip, MD  I have reviewed the above documentation for accuracy and completeness, and I agree with the above. -Dennard Nip, MD   OBESITY BEHAVIORAL INTERVENTION VISIT  Today's visit was # 2 out of 82.  Starting weight: 244 lbs Starting date: 02/10/17 Today's weight : 242 lbs  Today's date: 03/02/2017 Total lbs lost to date: 2 (Patients must lose 7 lbs in the first 6 months to continue with counseling)   ASK: We discussed the diagnosis of obesity with Colin Wood today and Colin Wood agreed to give [place] permission to discuss obesity behavioral modification therapy today.  ASSESS: Camilo has the diagnosis of obesity and his BMI today is 31.93 Bless is in the action stage of change   ADVISE: Arley was educated on the multiple health risks of obesity as well as the benefit of weight loss to improve his health. He was advised of the need for long term treatment and the importance of lifestyle modifications.  AGREE: Multiple dietary modification options and treatment options were discussed and  Kristin agreed to follow the Category 3 plan We discussed the following Behavioral Modification Strategies today: keeping healthy foods in the home, increasing lean protein intake, work on meal planning and easy cooking plans and emotional eating strategies

## 2017-03-07 ENCOUNTER — Encounter: Payer: 59 | Admitting: Thoracic Surgery (Cardiothoracic Vascular Surgery)

## 2017-03-10 ENCOUNTER — Other Ambulatory Visit: Payer: Self-pay | Admitting: Endocrinology

## 2017-03-10 ENCOUNTER — Other Ambulatory Visit: Payer: Self-pay | Admitting: Internal Medicine

## 2017-03-14 ENCOUNTER — Encounter: Payer: Self-pay | Admitting: Thoracic Surgery (Cardiothoracic Vascular Surgery)

## 2017-03-14 ENCOUNTER — Ambulatory Visit (INDEPENDENT_AMBULATORY_CARE_PROVIDER_SITE_OTHER): Payer: Self-pay | Admitting: Thoracic Surgery (Cardiothoracic Vascular Surgery)

## 2017-03-14 VITALS — BP 110/72 | HR 74 | Ht 73.0 in | Wt 255.0 lb

## 2017-03-14 DIAGNOSIS — Z8679 Personal history of other diseases of the circulatory system: Secondary | ICD-10-CM

## 2017-03-14 DIAGNOSIS — Z9889 Other specified postprocedural states: Secondary | ICD-10-CM

## 2017-03-14 NOTE — Progress Notes (Signed)
[address]       [place] [address]             [phone]     CARDIOTHORACIC SURGERY OFFICE NOTE  Referring Provider is Curvin Grayer, MD PCP is Colon Branch, MD   HPI:  Patient is a 56 year old moderately obese male with history of hypertension and type 2 diabetes mellitus who returns to the office today for routine follow-up approximately one year status post minimally invasive Maze procedure with closure of patent foramen ovale on 03/12/2016 for recurrent persistent atrial fibrillation.  He was last seen here in our office on 06/07/2016 at which time he was doing well and maintaining sinus rhythm.  He was last seen in the atrial fibrillation clinic on 11/09/2016 at which time he continues to maintain sinus rhythm. He returns for office today and reports that he is doing very well. He has never had any palpitations or other symptoms to suggest a recurrence of atrial fibrillation. He states that he does not exercise on a regular basis but he has no physical limitations. He specifically denies any symptoms of exertional shortness of breath or chest discomfort. He remains chronically anticoagulated using Xarelto.  His diabetes has been under good control. Overall he has no complaints and he feels much better than he did prior to surgery.   Current Outpatient Prescriptions  Medication Sig Dispense Refill  . allopurinol (ZYLOPRIM) 300 MG tablet Take 1 tablet (300 mg total) by mouth daily. 90 tablet 3  . Ascorbic Acid (VITAMIN C PO) Take 2 tablets by mouth 2 (two) times daily.    [name] atorvastatin (LIPITOR) 10 MG tablet TAKE 1 TABLET BY MOUTH  DAILY 90 tablet 0  . buPROPion (WELLBUTRIN SR) 150 MG 12 hr tablet Take 1 tablet (150 mg total) by mouth daily. 30 tablet 0  . canagliflozin (INVOKANA) 300 MG TABS tablet Take 1 tablet (300 mg total) by mouth daily before breakfast. 90 tablet 1  . docusate sodium (COLACE) 100 MG capsule Take 100 mg by mouth daily.    . famotidine  (PEPCID) 20 MG tablet Take 1 tablet (20 mg total) by mouth at bedtime. 90 tablet 3  . fenofibrate 160 MG tablet Take 1 tablet (160 mg total) by mouth daily. 90 tablet 3  . glimepiride (AMARYL) 4 MG tablet TAKE 1 TABLET BY MOUTH  DAILY WITH BREAKFAST 90 tablet 1  . glucose blood (ONE TOUCH ULTRA TEST) test strip Use to check blood sugar 3 times per day 300 each 2  . INVOKANA 300 MG TABS tablet TAKE 1 TABLET BY MOUTH  DAILY BEFORE BREAKFAST 90 tablet 1  . levothyroxine (SYNTHROID, LEVOTHROID) 175 MCG tablet Take 1 tablet (175 mcg total) by mouth daily before breakfast. 30 tablet 0  . lisinopril (PRINIVIL,ZESTRIL) 5 MG tablet TAKE 1 TABLET BY MOUTH  DAILY 90 tablet 2  . metFORMIN (GLUCOPHAGE) 1000 MG tablet TAKE 1 TABLET BY MOUTH TWO  TIMES DAILY WITH A MEAL 180 tablet 1  . metoprolol succinate (TOPROL-XL) 25 MG 24 hr tablet TAKE 1 TABLET BY MOUTH  DAILY 90 tablet 0  . Multiple Vitamin (MULTIVITAMIN) tablet Take 1 tablet by mouth daily.    [name] omega-3 acid ethyl esters (LOVAZA) 1 g capsule TAKE 2 CAPSULES BY MOUTH  TWO TIMES DAILY 360 capsule 1  . ONETOUCH DELICA LANCETS 62Z MISC Use to check blood sugar 3 times per day. 300 each 2  . rivaroxaban (XARELTO) 20 MG TABS  tablet Take 1 tablet (20 mg total) by mouth daily with supper. 90 tablet 1  . Vitamin D, Ergocalciferol, (DRISDOL) 50000 units CAPS capsule Take 1 capsule (50,000 Units total) by mouth every 7 (seven) days. 4 capsule 0   No current facility-administered medications for this visit.       Physical Exam:   BP 110/72   Pulse 74   Ht 6\' 1"  (1.854 m)   Wt 255 lb (115.7 kg)   SpO2 98%   BMI 33.64 kg/m   General:  Well-appearing  Chest:   Clear to auscultation  CV:   Regular rate and rhythm without murmur  Incisions:  Completely healed  Abdomen:  Soft nontender  Extremities:  Warm and well-perfused  Diagnostic Tests:  2 channel telemetry rhythm strip demonstrates normal sinus rhythm   Impression:  Patient is doing well and  maintaining sinus rhythm approximately 1 year status post minimally invasive Maze procedure  Plan:  We have not recommended any changes to the patient's current medications. We discussed the relative risks and benefits of continued long-term pharmacologic anticoagulation due to the history of atrial fibrillation. All of his questions been addressed  The patient will continue to follow-up with [name] in the atrial fibrillation clinic.  In the future he will call and return to see [place] only should specific problems or questions arise.  I spent in excess of 15 minutes during the conduct of this office consultation and >50% of this time involved direct face-to-face encounter with the patient for counseling and/or coordination of their care.   [name]. Roxy Manns, MD 03/14/2017 3:44 PM

## 2017-03-14 NOTE — Patient Instructions (Signed)
Continue all previous medications without any changes at this time  

## 2017-03-15 NOTE — Progress Notes (Signed)
Patient ID: Colin Wood, male   DOB: 1961/05/19, 56 y.o.   MRN: [id number]           Reason for Appointment: Follow-up for Type 2 Diabetes  Referring physician: Larose Kells  History of Present Illness:          Diagnosis: Type 2 diabetes mellitus, date of diagnosis: ?  2011        Past history: He was not having any symptoms at the time of diagnosis and not clear what his initial blood sugars were He was started on metformin initially and had fairly good control Subsequently with higher sugars he was given Amaryl also in addition which has been continued.  A1c was 10.6% in 2012 Subsequently in 2014 his A1c was down to 6.5 but he had a regular follow-up subsequently Because of an A1c of 8.1 in 02/2014 he was given Tradjenta in addition to the above drugs but this was not effective  He was referred here because of a high A1c of 7.9 in 07/2014  Recent history:   Oral hypoglycemic drugs the patient is taking are: metformin 1 g twice a day, Amaryl 4 mg in a.m., Invokana 300 mg daily      He has had difficulty controlling his diabetes because of his poor compliance with diet, exercise and glucose monitoring  A1c had been as low as 6.1 and now 6.6  Current management, blood sugar values and problems identified:  He says his main difficulty is not being able to control stress eating and probably getting too much carbohydrates and portions in the evenings  He has checked his sugars somewhat more than before but mostly fasting and these are mostly mildly increased  Occasionally may have a high reading after breakfast or lunch but has only one reading after supper which is good  If he does cut back on his portions and carbohydrates during the day may feel weak or shaky in the afternoon, lowest blood sugar 71 on his monitor  Still not able to find time to exercise even on weekends when he is mostly doing yardwork  He has started seeing Dr. Leafy Ro for weight management and is now on  Wellbutrin for a couple of weeks  He is about the same weight as on his last visit in June but his weight has been fluctuating recently higher        Side effects from medications have been:None  Compliance with the medical regimen: Fair   Glucose monitoring:  done occasionally        Glucometer:   One Touch.      Blood Glucose readings by review of monitor:   Mean values apply above for all meters except median for One Touch  PRE-MEAL Fasting Lunch Dinner Bedtime Overall  Glucose range: 127-179       Mean/median: 138     134    POST-MEAL PC Breakfast PC Lunch PC Dinner  Glucose range: 89-239    Mean/median:   71-214       Self-care: The diet that the patient has been following is: None, he has difficulty controlling portions     Meals: 3 meals per day. His breakfast  is frequently a biscuit at a fast food restaurant.   He is snacking on apples and yogurt  snacks           Exercise: No programmed exercise, some yardwork          Dietician visit, most recent: none, previously had gone  to a class          Weight history: Previous range 260-280  Wt Readings from Last 3 Encounters:  03/16/17 248 lb 9.6 oz (112.8 kg)  03/14/17 255 lb (115.7 kg)  03/02/17 242 lb (109.8 kg)    Glycemic control:   Lab Results  Component Value Date   HGBA1C 6.6 (H) 02/10/2017   HGBA1C 6.8 11/08/2016   HGBA1C 6.1 (H) 04/27/2016   Lab Results  Component Value Date   LDLCALC 104 (H) 02/10/2017   CREATININE 0.91 02/10/2017      Allergies as of 03/16/2017   No Known Allergies     Medication List       Accurate as of 03/16/17  8:21 AM. Always use your most recent med list.          allopurinol 300 MG tablet Commonly known as:  ZYLOPRIM Take 1 tablet (300 mg total) by mouth daily.   atorvastatin 10 MG tablet Commonly known as:  LIPITOR TAKE 1 TABLET BY MOUTH  DAILY   buPROPion 150 MG 12 hr tablet Commonly known as:  WELLBUTRIN SR Take 1 tablet (150 mg total) by mouth  daily.   docusate sodium 100 MG capsule Commonly known as:  COLACE Take 100 mg by mouth daily.   famotidine 20 MG tablet Commonly known as:  PEPCID Take 1 tablet (20 mg total) by mouth at bedtime.   fenofibrate 160 MG tablet Take 1 tablet (160 mg total) by mouth daily.   glimepiride 4 MG tablet Commonly known as:  AMARYL TAKE 1 TABLET BY MOUTH  DAILY WITH BREAKFAST   glucose blood test strip Commonly known as:  ONE TOUCH ULTRA TEST Use to check blood sugar 3 times per day   INVOKANA 300 MG Tabs tablet Generic drug:  canagliflozin TAKE 1 TABLET BY MOUTH  DAILY BEFORE BREAKFAST   levothyroxine 175 MCG tablet Commonly known as:  SYNTHROID, LEVOTHROID Take 1 tablet (175 mcg total) by mouth daily before breakfast.   lisinopril 5 MG tablet Commonly known as:  PRINIVIL,ZESTRIL TAKE 1 TABLET BY MOUTH  DAILY   metFORMIN 1000 MG tablet Commonly known as:  GLUCOPHAGE TAKE 1 TABLET BY MOUTH TWO  TIMES DAILY WITH A MEAL   metoprolol succinate 25 MG 24 hr tablet Commonly known as:  TOPROL-XL TAKE 1 TABLET BY MOUTH  DAILY   multivitamin tablet Take 1 tablet by mouth daily.   omega-3 acid ethyl esters 1 g capsule Commonly known as:  LOVAZA TAKE 2 CAPSULES BY MOUTH  TWO TIMES DAILY   ONETOUCH DELICA LANCETS 16S Misc Use to check blood sugar 3 times per day.   rivaroxaban 20 MG Tabs tablet Commonly known as:  XARELTO Take 1 tablet (20 mg total) by mouth daily with supper.   VITAMIN C PO Take 2 tablets by mouth 2 (two) times daily.   Vitamin D (Ergocalciferol) 50000 units Caps capsule Commonly known as:  DRISDOL Take 1 capsule (50,000 Units total) by mouth every 7 (seven) days.       Allergies:  No Known Allergies  Past Medical History:  Diagnosis Date  . Anxiety   . Atrial flutter ([place])    typical appearing; s/p ablation 12-2012  . Constipation   . Depression    used to see psych, on no meds as of 03/2009  . Diabetes mellitus    TYPE 2  . Fatty liver   .  GERD (gastroesophageal reflux disease)   . Gout   . Hyperlipidemia   .  Hypothyroidism   . Leg edema   . Obesity   . Persistent atrial fibrillation ([place])    PVI 12/2012  . Prediabetes   . Rhinitis    vasomotor  . S/P Minimally invasive maze operation for atrial fibrillation 03/12/2016   Complete bilateral atrial lesion set using cryothermy and bipolar radiofrequency ablation with clipping of LA appendage via right mini thoracotomy approach  . S/P patent foramen ovale closure 03/12/2016  . Sleep apnea    mild, now treated with CPAP since ~9-12    Past Surgical History:  Procedure Laterality Date  . ABLATION OF DYSRHYTHMIC FOCUS  01/05/2013   PVI and flutter ablation by [name]  . ATRIAL FIBRILLATION ABLATION N/A 01/05/2013   Procedure: ATRIAL FIBRILLATION ABLATION;  Surgeon: Johannes Grayer, MD;  Location: [facility] CATH LAB;  Service: Cardiovascular;  Laterality: N/A;  . CARDIAC CATHETERIZATION N/A 01/12/2016   Procedure: Right/Left Heart Cath and Coronary Angiography;  Surgeon: [name], MD;  Location: [facility] CV LAB;  Service: Cardiovascular;  Laterality: N/A;  . CARDIOVERSION  03/01/11  . CLIPPING OF ATRIAL APPENDAGE Left 03/12/2016   Procedure: CLIPPING OF ATRIAL APPENDAGE;  Surgeon: [name], MD;  Location: [place];  Service: Open Heart Surgery;  Laterality: Left;  . MINIMALLY INVASIVE MAZE PROCEDURE N/A 03/12/2016   Procedure: MINIMALLY INVASIVE MAZE PROCEDURE;  Surgeon: [name], MD;  Location: [place];  Service: Open Heart Surgery;  Laterality: N/A;  . NASAL SINUS SURGERY    . TEE WITHOUT CARDIOVERSION N/A 01/04/2013   Procedure: TRANSESOPHAGEAL ECHOCARDIOGRAM (TEE);  Surgeon: [name], MD;  Location: [facility] ENDOSCOPY;  Service: Cardiovascular;  Laterality: N/A;  . TEE WITHOUT CARDIOVERSION N/A 03/12/2016   Procedure: TRANSESOPHAGEAL ECHOCARDIOGRAM (TEE);  Surgeon: [name], MD;  Location: [place];  Service: Open Heart Surgery;  Laterality: N/A;  . TONSILLECTOMY     . VASECTOMY      Family History  Problem Relation Age of Onset  . Asthma Mother   . Cancer Mother   . Diabetes Father   . Hyperlipidemia Father   . Heart disease Father   . Obesity Father   . Hypertension Brother   . Melanoma Brother   . Allergies Daughter   . Throat cancer Maternal Uncle   . Colon cancer Paternal Grandfather   . Prostate cancer Neg Hx     Social History:  reports that he has never smoked. He has never used smokeless tobacco. He reports that he does not drink alcohol or use drugs.    Review of Systems         Lipids:  has high triglycerides mostly previously as high as 681; LDL previously also consistently high.   On Fenofibrate and taking Lipitor 10 mg daily  Has not had his follow-up fasting labs recently       Lab Results  Component Value Date   CHOL 171 02/10/2017   HDL 47 02/10/2017   LDLCALC 104 (H) 02/10/2017   TRIG 102 02/10/2017   CHOLHDL 3.9 11/10/2016                   Thyroid: He was diagnosed to have hypothyroidism in 1994 has been on medication since then  He is compliant with his Synthroid and  has been on generic No complaints of palpitations However her TSH has been persistently low He was told to reduce the dose down to 175 On his last visit but apparently he did not get this prescription and  only last month had any prescription done by Dr. Leafy Ro Has been on 175 for about 2 weeks now   Lab Results  Component Value Date   TSH 0.208 (L) 02/10/2017   TSH 0.198 (L) 11/10/2016   TSH 0.271 (L) 06/14/2016   FREET4 1.93 (H) 02/10/2017   FREET4 1.78 (H) 06/14/2016       The blood pressure has been Treated with low-dose lisinopril and metoprolol   BP Readings from Last 3 Encounters:  03/16/17 124/76  03/14/17 110/72  03/02/17 112/71        Physical Examination:  BP 124/76   Pulse 69   Ht 6\' 1"  (1.854 m)   Wt 248 lb 9.6 oz (112.8 kg)   SpO2 97%   BMI 32.80 kg/m     ASSESSMENT:  Diabetes type 2,  uncontrolled with obesity He is on a 3 drug regimen of metformin, Amaryl and Invokana 300 mg  See history of present illness for detailed discussion of his current management, blood sugar patterns and problems identified  He has difficulty  keeping his weight down and controlling diet as before Recently has gained weight He is not finding the time to exercise either Fasting readings are mostly high but somewhat variable Postprandial readings have been done occasionally only and mostly in the mornings with only one high reading so far Discussed that he needs to be on a better regimen long-term and also may benefit from a GLP-1 drug instead of Amaryl  HYPERTRIGLYCERIDEMIA:  improved but LDL is still slightly high recently    HYPOTHYROIDISM: TSH has been low, he Recently is on a lower dose and will need labs again in 2 months   PLAN:  Discussed with the patient the nature of GLP-1 drugs, the action on various organ systems, how they benefit blood glucose control, as well as the benefit of weight loss and  increase satiety . Explained possible side effects, particularly nausea and vomiting that usually resolve over time; discussed safety information in package insert. Demonstrated the medication injection device and injection technique to the patient.  Showed patient where to inject the medication. To start with 0.75 mg dosage weekly for the first 4 weeks, he will call at the end of the first prescription and if no nausea he will go up to 1.5 mg He can start injecting this Saturday Patient brochure on [employer] with enclosed co-pay card given  More blood sugars after meals  Start regular exercise  Follow-up 2 months     Micheil Klaus 03/16/2017, 8:21 AM   Note: This office note was prepared with Estate agent. Any transcriptional errors that result from this process are unintentional.

## 2017-03-16 ENCOUNTER — Encounter: Payer: Self-pay | Admitting: Endocrinology

## 2017-03-16 ENCOUNTER — Ambulatory Visit (INDEPENDENT_AMBULATORY_CARE_PROVIDER_SITE_OTHER): Payer: 59 | Admitting: Endocrinology

## 2017-03-16 ENCOUNTER — Other Ambulatory Visit: Payer: Self-pay

## 2017-03-16 ENCOUNTER — Ambulatory Visit (INDEPENDENT_AMBULATORY_CARE_PROVIDER_SITE_OTHER): Payer: 59 | Admitting: Family Medicine

## 2017-03-16 VITALS — BP 124/76 | HR 69 | Ht 73.0 in | Wt 248.6 lb

## 2017-03-16 VITALS — BP 107/67 | HR 70 | Temp 97.9°F | Ht 73.0 in | Wt 244.0 lb

## 2017-03-16 DIAGNOSIS — Z6832 Body mass index (BMI) 32.0-32.9, adult: Secondary | ICD-10-CM

## 2017-03-16 DIAGNOSIS — Z9189 Other specified personal risk factors, not elsewhere classified: Secondary | ICD-10-CM

## 2017-03-16 DIAGNOSIS — F3289 Other specified depressive episodes: Secondary | ICD-10-CM | POA: Diagnosis not present

## 2017-03-16 DIAGNOSIS — E669 Obesity, unspecified: Secondary | ICD-10-CM

## 2017-03-16 DIAGNOSIS — E038 Other specified hypothyroidism: Secondary | ICD-10-CM

## 2017-03-16 DIAGNOSIS — E119 Type 2 diabetes mellitus without complications: Secondary | ICD-10-CM

## 2017-03-16 DIAGNOSIS — E1165 Type 2 diabetes mellitus with hyperglycemia: Secondary | ICD-10-CM

## 2017-03-16 MED ORDER — METFORMIN HCL 1000 MG PO TABS: ORAL_TABLET | ORAL | 1 refills | Status: DC

## 2017-03-16 MED ORDER — CANAGLIFLOZIN 300 MG PO TABS: ORAL_TABLET | ORAL | 1 refills | Status: DC

## 2017-03-16 MED ORDER — GLIMEPIRIDE 4 MG PO TABS: 4.0000 mg | ORAL_TABLET | Freq: Every day | ORAL | 1 refills | Status: DC

## 2017-03-16 MED ORDER — ATORVASTATIN CALCIUM 10 MG PO TABS: 10.0000 mg | ORAL_TABLET | Freq: Every day | ORAL | 0 refills | Status: DC

## 2017-03-16 MED ORDER — ONETOUCH DELICA LANCETS 33G MISC: 2 refills | Status: DC

## 2017-03-16 MED ORDER — GLUCOSE BLOOD VI STRP: ORAL_STRIP | 2 refills | Status: DC

## 2017-03-16 MED ORDER — OMEGA-3-ACID ETHYL ESTERS 1 G PO CAPS: ORAL_CAPSULE | ORAL | 1 refills | Status: DC

## 2017-03-16 MED ORDER — LEVOTHYROXINE SODIUM 175 MCG PO TABS: 175.0000 ug | ORAL_TABLET | Freq: Every day | ORAL | 0 refills | Status: DC

## 2017-03-16 MED ORDER — DULAGLUTIDE 0.75 MG/0.5ML ~~LOC~~ SOAJ: SUBCUTANEOUS | 0 refills | Status: DC

## 2017-03-16 MED ORDER — BUPROPION HCL ER (SR) 200 MG PO TB12: 200.0000 mg | ORAL_TABLET | Freq: Every day | ORAL | 0 refills | Status: DC

## 2017-03-16 NOTE — Patient Instructions (Signed)
Check blood sugars on waking up 3/7   Also check blood sugars about 2 hours after a meal and do this after different meals by rotation  Recommended blood sugar levels on waking up is 90-130 and about 2 hours after meal is 130-160  Please bring your blood sugar monitor to each visit, thank you  After 1 week stop Amaryl, now do 12 am and pm   Start TRULICITYwith the pen as shown once weekly on the same day of the week.   You may inject in the stomach, thigh or arm as indicated in the brochure given.  You will feel fullness of the stomach with starting the medication and should try to keep the portions at meals small.  You may experience nausea in the first few days which usually gets better over time   If any questions or concerns are present call the office or the  [place] at [phone]. Also visit [email or web address] website for more useful information

## 2017-03-17 NOTE — Progress Notes (Signed)
Office: [phone]  /  Fax: [phone]   HPI:   Chief Complaint: OBESITY Colin Wood is here to discuss his progress with his obesity treatment plan. He is on the Category 3 plan and is following his eating plan approximately 20 % of the time. He states he is exercising 0 minutes 0 times per week. Colin Wood has been off track in the last 2 weeks with increased stress and emotional eating.  His weight is 244 lb (110.7 kg) today and has gained 2 pounds since his last visit. He has lost 0 lbs since starting treatment with [place].  Diabetes II Colin Wood has a diagnosis of diabetes type II. Colin Wood is ready to start Trulicity, he has had some elevated BGs with increased celebration. He denies any hypoglycemic episodes. Last A1c was 6.6 on 02/10/17. He has been working on intensive lifestyle modifications including diet, exercise, and weight loss to help control his blood glucose levels.  At risk for cardiovascular disease Colin Wood is at a higher than average risk for cardiovascular disease due to obesity and diabetes. He currently denies any chest pain.  Depression with emotional eating behaviors Colin Wood is struggling with emotional eating and using food for comfort to the extent that it is negatively impacting his health. He often snacks when he is not hungry. Colin Wood sometimes feels he is out of control and then feels guilty that he made poor food choices. Colin Wood has started Wellbutrin but he doesn't feel it has helped, he still notes emotional eating. He has been working on behavior modification techniques to help reduce his emotional eating and has been somewhat successful. He shows no sign of suicidal or homicidal ideations.  Depression screen Colin Wood 2/9 02/10/2017 12/20/2014  Decreased Interest 2 0  Down, Depressed, Hopeless 1 0  PHQ - 2 Score 3 0  Altered sleeping 3 -  Tired, decreased energy 3 -  Change in appetite 0 -  Feeling bad or failure about yourself  2 -  Trouble concentrating 1 -    Moving slowly or fidgety/restless 0 -  Suicidal thoughts 0 -  PHQ-9 Score 12 -  Difficult doing work/chores Not difficult at all -  Some recent data might be hidden   ALLERGIES: No Known Allergies  MEDICATIONS: Current Outpatient Prescriptions on File Prior to Visit  Medication Sig Dispense Refill  . allopurinol (ZYLOPRIM) 300 MG tablet Take 1 tablet (300 mg total) by mouth daily. 90 tablet 3  . Ascorbic Acid (VITAMIN C PO) Take 2 tablets by mouth 2 (two) times daily.    [name] docusate sodium (COLACE) 100 MG capsule Take 100 mg by mouth daily.    . Dulaglutide (TRULICITY) 5.39 JQ/7.3AL SOPN Inject in the abdominal skin as directed once a week 4 pen 0  . famotidine (PEPCID) 20 MG tablet Take 1 tablet (20 mg total) by mouth at bedtime. 90 tablet 3  . fenofibrate 160 MG tablet Take 1 tablet (160 mg total) by mouth daily. 90 tablet 3  . lisinopril (PRINIVIL,ZESTRIL) 5 MG tablet TAKE 1 TABLET BY MOUTH  DAILY 90 tablet 2  . metoprolol succinate (TOPROL-XL) 25 MG 24 hr tablet TAKE 1 TABLET BY MOUTH  DAILY 90 tablet 0  . Multiple Vitamin (MULTIVITAMIN) tablet Take 1 tablet by mouth daily.    . rivaroxaban (XARELTO) 20 MG TABS tablet Take 1 tablet (20 mg total) by mouth daily with supper. 90 tablet 1  . Vitamin D, Ergocalciferol, (DRISDOL) 50000 units CAPS capsule Take 1 capsule (50,000 Units total) by mouth  every 7 (seven) days. 4 capsule 0   No current facility-administered medications on file prior to visit.     PAST MEDICAL HISTORY: Past Medical History:  Diagnosis Date  . Anxiety   . Atrial flutter ([place])    typical appearing; s/p ablation 12-2012  . Constipation   . Depression    used to see psych, on no meds as of 03/2009  . Diabetes mellitus    TYPE 2  . Fatty liver   . GERD (gastroesophageal reflux disease)   . Gout   . Hyperlipidemia   . Hypothyroidism   . Leg edema   . Obesity   . Persistent atrial fibrillation ([place])    PVI 12/2012  . Prediabetes   . Rhinitis     vasomotor  . S/P Minimally invasive maze operation for atrial fibrillation 03/12/2016   Complete bilateral atrial lesion set using cryothermy and bipolar radiofrequency ablation with clipping of LA appendage via right mini thoracotomy approach  . S/P patent foramen ovale closure 03/12/2016  . Sleep apnea    mild, now treated with CPAP since ~9-12    PAST SURGICAL HISTORY: Past Surgical History:  Procedure Laterality Date  . ABLATION OF DYSRHYTHMIC FOCUS  01/05/2013   PVI and flutter ablation by [name]  . ATRIAL FIBRILLATION ABLATION N/A 01/05/2013   Procedure: ATRIAL FIBRILLATION ABLATION;  Surgeon: Mccumbers Grayer, MD;  Location: [facility] CATH LAB;  Service: Cardiovascular;  Laterality: N/A;  . CARDIAC CATHETERIZATION N/A 01/12/2016   Procedure: Right/Left Heart Cath and Coronary Angiography;  Surgeon: [name], MD;  Location: [place] CV LAB;  Service: Cardiovascular;  Laterality: N/A;  . CARDIOVERSION  03/01/11  . CLIPPING OF ATRIAL APPENDAGE Left 03/12/2016   Procedure: CLIPPING OF ATRIAL APPENDAGE;  Surgeon: [name], MD;  Location: [place];  Service: Open Heart Surgery;  Laterality: Left;  . MINIMALLY INVASIVE MAZE PROCEDURE N/A 03/12/2016   Procedure: MINIMALLY INVASIVE MAZE PROCEDURE;  Surgeon: [name], MD;  Location: [place];  Service: Open Heart Surgery;  Laterality: N/A;  . NASAL SINUS SURGERY    . TEE WITHOUT CARDIOVERSION N/A 01/04/2013   Procedure: TRANSESOPHAGEAL ECHOCARDIOGRAM (TEE);  Surgeon: [name], MD;  Location: [facility] ENDOSCOPY;  Service: Cardiovascular;  Laterality: N/A;  . TEE WITHOUT CARDIOVERSION N/A 03/12/2016   Procedure: TRANSESOPHAGEAL ECHOCARDIOGRAM (TEE);  Surgeon: [name], MD;  Location: [place];  Service: Open Heart Surgery;  Laterality: N/A;  . TONSILLECTOMY    . VASECTOMY      SOCIAL HISTORY: Social History  Substance Use Topics  . Smoking status: Never Smoker  . Smokeless tobacco: Never Used  . Alcohol use No    FAMILY  HISTORY: Family History  Problem Relation Age of Onset  . Asthma Mother   . Cancer Mother   . Diabetes Father   . Hyperlipidemia Father   . Heart disease Father   . Obesity Father   . Hypertension Brother   . Melanoma Brother   . Allergies Daughter   . Throat cancer Maternal Uncle   . Colon cancer Paternal Grandfather   . Prostate cancer Neg Hx     ROS: Review of Systems  Constitutional: Negative for weight loss.  Cardiovascular: Negative for chest pain.  Endo/Heme/Allergies:       Negative hypoglycemia  Psychiatric/Behavioral: Positive for depression. Negative for suicidal ideas.    PHYSICAL EXAM: Blood pressure 107/67, pulse 70, temperature 97.9 F (36.6 C), temperature source Oral, height 6\' 1"  (1.854 m), weight 244  lb (110.7 kg), SpO2 97 %. Body mass index is 32.19 kg/m. Physical Exam  Constitutional: He is oriented to person, place, and time. He appears well-developed and well-nourished.  Cardiovascular: Normal rate.   Pulmonary/Chest: Effort normal.  Musculoskeletal: Normal range of motion.  Neurological: He is oriented to person, place, and time.  Skin: Skin is warm and dry.  Psychiatric: He has a normal mood and affect.  Vitals reviewed.   RECENT LABS AND TESTS: BMET    Component Value Date/Time   NA 143 02/10/2017 1131   K 4.6 02/10/2017 1131   CL 102 02/10/2017 1131   CO2 21 02/10/2017 1131   GLUCOSE 100 (H) 02/10/2017 1131   GLUCOSE 103 (H) 03/17/2016 0312   GLUCOSE 154 04/01/2009 0000   BUN 15 02/10/2017 1131   CREATININE 0.91 02/10/2017 1131   CALCIUM 10.2 02/10/2017 1131   GFRNONAA 93 02/10/2017 1131   GFRAA 108 02/10/2017 1131   Lab Results  Component Value Date   HGBA1C 6.6 (H) 02/10/2017   HGBA1C 6.8 11/08/2016   HGBA1C 6.1 (H) 04/27/2016   HGBA1C 7.5 03/02/2016   HGBA1C 7.0 (H) 04/14/2015   Lab Results  Component Value Date   INSULIN 15.6 02/10/2017   CBC    Component Value Date/Time   WBC 6.1 02/10/2017 1131   WBC 7.5  03/16/2016 0439   RBC 5.22 02/10/2017 1131   RBC 3.85 (L) 03/16/2016 0439   HGB 15.1 02/10/2017 1131   HCT 43.0 02/10/2017 1131   PLT 542 (A) 03/30/2016   PLT 192 01/05/2016 0815   MCV 82 02/10/2017 1131   MCH 28.9 02/10/2017 1131   MCH 29.1 03/16/2016 0439   MCHC 35.1 02/10/2017 1131   MCHC 35.0 03/16/2016 0439   RDW 13.5 02/10/2017 1131   LYMPHSABS 2.2 02/10/2017 1131   EOSABS 0.1 02/10/2017 1131   BASOSABS 0.0 02/10/2017 1131   Iron/TIBC/Ferritin/ %Sat No results found for: IRON, TIBC, FERRITIN, IRONPCTSAT Lipid Panel     Component Value Date/Time   CHOL 171 02/10/2017 1131   TRIG 102 02/10/2017 1131   HDL 47 02/10/2017 1131   CHOLHDL 3.9 11/10/2016 0945   LDLCALC 104 (H) 02/10/2017 1131   Hepatic Function Panel     Component Value Date/Time   PROT 7.3 02/10/2017 1131   ALBUMIN 5.4 02/10/2017 1131   AST 22 02/10/2017 1131   ALT 32 02/10/2017 1131   ALKPHOS 43 02/10/2017 1131   BILITOT 0.5 02/10/2017 1131      Component Value Date/Time   TSH 0.208 (L) 02/10/2017 1131   TSH 0.198 (L) 11/10/2016 0945   TSH 0.271 (L) 06/14/2016 0727    ASSESSMENT AND PLAN: Type 2 diabetes mellitus without complication, without long-term current use of insulin (HCC)  Other depression - with emotional eating - Plan: buPROPion (WELLBUTRIN SR) 200 MG 12 hr tablet  At risk for heart disease  Class 1 obesity with serious comorbidity and body mass index (BMI) of 32.0 to 32.9 in adult, unspecified obesity type  PLAN:  Diabetes II Karmelo has been given extensive diabetes education by myself today including ideal fasting and post-prandial blood glucose readings, individual ideal Hgb A1c goals  and hypoglycemia prevention. Colin Wood has been educated on Musician. We discussed the importance of good blood sugar control to decrease the likelihood of diabetic complications such as nephropathy, neuropathy, limb loss, blindness, coronary artery disease, and death. We discussed the importance  of intensive lifestyle modification and encouraged him to get back to diet, exercise and weight  loss as the first line treatment for diabetes. Colin Wood agrees to continue his diabetes medications as prescribed and will follow up with our clinic in 2 weeks.  Cardiovascular risk counselling Colin Wood was given extended (15 minutes) coronary artery disease prevention counseling today. He is 56 y.o. male and has risk factors for heart disease including obesity and diabetes. We discussed intensive lifestyle modifications today with an emphasis on specific weight loss instructions and strategies. Pt was also informed of the importance of increasing exercise and decreasing saturated fats to help prevent heart disease.  Depression with Emotional Eating Behaviors We discussed behavior modification techniques today to help Colin Wood deal with his emotional eating and depression. Kayven agrees to increase Wellbutrin SR at 200 mg q AM #30 with no refills and he agrees to follow up with our clinic in 2 weeks.  Obesity Colin Wood is currently in the action stage of change. As such, his goal is to continue with weight loss efforts He has agreed to change to keep a food journal with 1500-1800 calories and 100 grams of protein daily Colin Wood has been instructed to work up to a goal of 150 minutes of combined cardio and strengthening exercise per week for weight loss and overall health benefits. We discussed the following Behavioral Modification Strategies today: increasing lean protein intake, decreasing simple carbohydrates, and decrease eating out   Colin Wood has agreed to follow up with our clinic in 2 weeks. He was informed of the importance of frequent follow up visits to maximize his success with intensive lifestyle modifications for his multiple health conditions.  Colin Wood, Colin Wood, am acting as transcriptionist for Colin Nip, MD  Colin Wood have reviewed the above documentation for accuracy and completeness, and Colin Wood agree  with the above. -Colin Nip, MD     Today's visit was # 3 out of 22.  Starting weight: 244 lbs Starting date: 02/10/17 Today's weight : 244 lbs Today's date: 03/16/2017 Total lbs lost to date: 0 (Patients must lose 7 lbs in the first 6 months to continue with counseling)   ASK: We discussed the diagnosis of obesity with Colin Wood today and Colin Wood agreed to give [place] permission to discuss obesity behavioral modification therapy today.  ASSESS: Colin Wood has the diagnosis of obesity and his BMI today is 23 Colin Wood is in the action stage of change   ADVISE: Colin Wood was educated on the multiple health risks of obesity as well as the benefit of weight loss to improve his health. He was advised of the need for long term treatment and the importance of lifestyle modifications.  AGREE: Multiple dietary modification options and treatment options were discussed and  Kamron agreed to keep a food journal with 1500-1800 calories and 100 grams of protein daily We discussed the following Behavioral Modification Strategies today: increasing lean protein intake, decreasing simple carbohydrates, and decrease eating out

## 2017-03-29 ENCOUNTER — Other Ambulatory Visit (INDEPENDENT_AMBULATORY_CARE_PROVIDER_SITE_OTHER): Payer: Self-pay | Admitting: Family Medicine

## 2017-03-29 DIAGNOSIS — E559 Vitamin D deficiency, unspecified: Secondary | ICD-10-CM

## 2017-03-29 DIAGNOSIS — E038 Other specified hypothyroidism: Secondary | ICD-10-CM

## 2017-03-30 ENCOUNTER — Ambulatory Visit (INDEPENDENT_AMBULATORY_CARE_PROVIDER_SITE_OTHER): Payer: 59 | Admitting: Family Medicine

## 2017-04-02 ENCOUNTER — Other Ambulatory Visit (INDEPENDENT_AMBULATORY_CARE_PROVIDER_SITE_OTHER): Payer: Self-pay | Admitting: Family Medicine

## 2017-04-02 DIAGNOSIS — E559 Vitamin D deficiency, unspecified: Secondary | ICD-10-CM

## 2017-04-06 ENCOUNTER — Encounter: Payer: Self-pay | Admitting: Internal Medicine

## 2017-04-06 ENCOUNTER — Ambulatory Visit (INDEPENDENT_AMBULATORY_CARE_PROVIDER_SITE_OTHER): Payer: Self-pay | Admitting: Internal Medicine

## 2017-04-06 ENCOUNTER — Ambulatory Visit (INDEPENDENT_AMBULATORY_CARE_PROVIDER_SITE_OTHER): Payer: 59 | Admitting: Family Medicine

## 2017-04-06 VITALS — BP 100/67 | HR 84 | Temp 98.3°F | Ht 73.0 in | Wt 238.0 lb

## 2017-04-06 VITALS — BP 124/68 | HR 81 | Temp 98.0°F | Resp 14 | Ht 73.0 in | Wt 240.1 lb

## 2017-04-06 DIAGNOSIS — E038 Other specified hypothyroidism: Secondary | ICD-10-CM

## 2017-04-06 DIAGNOSIS — E559 Vitamin D deficiency, unspecified: Secondary | ICD-10-CM | POA: Diagnosis not present

## 2017-04-06 DIAGNOSIS — Z6831 Body mass index (BMI) 31.0-31.9, adult: Secondary | ICD-10-CM

## 2017-04-06 DIAGNOSIS — Z9189 Other specified personal risk factors, not elsewhere classified: Secondary | ICD-10-CM | POA: Diagnosis not present

## 2017-04-06 DIAGNOSIS — Z23 Encounter for immunization: Secondary | ICD-10-CM

## 2017-04-06 DIAGNOSIS — E669 Obesity, unspecified: Secondary | ICD-10-CM | POA: Diagnosis not present

## 2017-04-06 DIAGNOSIS — E119 Type 2 diabetes mellitus without complications: Secondary | ICD-10-CM | POA: Diagnosis not present

## 2017-04-06 DIAGNOSIS — Z0000 Encounter for general adult medical examination without abnormal findings: Secondary | ICD-10-CM | POA: Diagnosis not present

## 2017-04-06 MED ORDER — LEVOTHYROXINE SODIUM 175 MCG PO TABS: 175.0000 ug | ORAL_TABLET | Freq: Every day | ORAL | 0 refills | Status: DC

## 2017-04-06 MED ORDER — VITAMIN D (ERGOCALCIFEROL) 1.25 MG (50000 UNIT) PO CAPS: 50000.0000 [IU] | ORAL_CAPSULE | ORAL | 0 refills | Status: DC

## 2017-04-06 MED ORDER — ALLOPURINOL 300 MG PO TABS: 300.0000 mg | ORAL_TABLET | Freq: Every day | ORAL | 3 refills | Status: DC

## 2017-04-06 MED ORDER — FAMOTIDINE 20 MG PO TABS: 20.0000 mg | ORAL_TABLET | Freq: Every day | ORAL | 3 refills | Status: DC

## 2017-04-06 MED ORDER — FENOFIBRATE 160 MG PO TABS: 160.0000 mg | ORAL_TABLET | Freq: Every day | ORAL | 3 refills | Status: DC

## 2017-04-06 MED ORDER — DULAGLUTIDE 1.5 MG/0.5ML ~~LOC~~ SOAJ: 1.5000 mg | SUBCUTANEOUS | 0 refills | Status: DC

## 2017-04-06 NOTE — Assessment & Plan Note (Addendum)
-   Td today, flu shot at work -CCS: Cscope 08-2005: int.  hemorrhoids, next due, referral failed but plans to call GI -Prostate cancer screening: DRE wnl 09/2016, check a PSA -doing great w/ diet, + wt loss, praised

## 2017-04-06 NOTE — Progress Notes (Signed)
Subjective:    Patient ID: Colin Wood, male    DOB: [birth year], 56 y.o.   MRN: [id number]  DOS:  04/06/2017 Type of visit - description : cpx Interval history: In general feels great, doing much better with diet, has lost some weight.  Wt Readings from Last 3 Encounters:  04/06/17 238 lb (108 kg)  04/06/17 240 lb 2 oz (108.9 kg)  03/16/17 244 lb (110.7 kg)     Review of Systems + Stress at work but in general mood is well controlled.  On Wellbutrin.   Other than above, a 14 point review of systems is negative    Past Medical History:  Diagnosis Date  . Anxiety   . Atrial flutter ([place])    typical appearing; s/p ablation 12-2012  . Constipation   . Depression    used to see psych, on no meds as of 03/2009  . Diabetes mellitus    TYPE 2  . Fatty liver   . GERD (gastroesophageal reflux disease)   . Gout   . Hyperlipidemia   . Hypothyroidism   . Leg edema   . Obesity   . Persistent atrial fibrillation ([place])    PVI 12/2012  . Prediabetes   . Rhinitis    vasomotor  . S/P Minimally invasive maze operation for atrial fibrillation 03/12/2016   Complete bilateral atrial lesion set using cryothermy and bipolar radiofrequency ablation with clipping of LA appendage via right mini thoracotomy approach  . S/P patent foramen ovale closure 03/12/2016  . Sleep apnea    mild, now treated with CPAP since ~9-12    Past Surgical History:  Procedure Laterality Date  . ABLATION OF DYSRHYTHMIC FOCUS  01/05/2013   PVI and flutter ablation by [name]  . CARDIOVERSION  03/01/11  . NASAL SINUS SURGERY    . TONSILLECTOMY    . VASECTOMY      Social History   Socioeconomic History  . Marital status: Married    Spouse name: Mardene Celeste  . Number of children: 2  . Years of education: Not on file  . Highest education level: Not on file  Social Needs  . Financial resource strain: Not on file  . Food insecurity - worry: Not on file  . Food insecurity - inability: Not on file  .  Transportation needs - medical: Not on file  . Transportation needs - non-medical: Not on file  Occupational History  . Occupation: [employer]- [employer]  Tobacco Use  . Smoking status: Never Smoker  . Smokeless tobacco: Never Used  Substance and Sexual Activity  . Alcohol use: No  . Drug use: No  . Sexual activity: Not on file  Other Topics Concern  . Not on file  Social History Narrative   Pt lives in [place] with spouse and 1  Child   2 children  1988, 2000 .     Works in Engineer, technical sales at [employer].     Family History  Problem Relation Age of Onset  . Asthma Mother   . Breast cancer Mother   . Dementia Mother   . Diabetes Father   . Hyperlipidemia Father   . Obesity Father   . Atrial fibrillation Father   . Hypertension Brother   . Melanoma Brother   . Allergies Daughter   . Throat cancer Maternal Uncle   . Colon cancer Paternal Grandfather   . Prostate cancer Neg Hx      Allergies as of 04/06/2017   No Known  Allergies     Medication List        Accurate as of 04/06/17  5:06 PM. Always use your most recent med list.          allopurinol 300 MG tablet Commonly known as:  ZYLOPRIM Take 1 tablet (300 mg total) daily by mouth.   atorvastatin 10 MG tablet Commonly known as:  LIPITOR Take 1 tablet (10 mg total) by mouth daily.   buPROPion 200 MG 12 hr tablet Commonly known as:  WELLBUTRIN SR Take 1 tablet (200 mg total) by mouth daily at 12 noon.   canagliflozin 300 MG Tabs tablet Commonly known as:  INVOKANA TAKE 1 TABLET BY MOUTH  DAILY BEFORE BREAKFAST   docusate sodium 100 MG capsule Commonly known as:  COLACE Take 100 mg by mouth daily.   Dulaglutide 1.5 MG/0.5ML Sopn Commonly known as:  TRULICITY Inject 1.5 mg every 7 (seven) days into the skin.   famotidine 20 MG tablet Commonly known as:  PEPCID Take 1 tablet (20 mg total) at bedtime by mouth.   fenofibrate 160 MG tablet Take 1 tablet (160 mg total) daily by mouth.   glucose blood test strip Commonly  known as:  ONE TOUCH ULTRA TEST Use to check blood sugar 3 times per day   levothyroxine 175 MCG tablet Commonly known as:  SYNTHROID, LEVOTHROID Take 1 tablet (175 mcg total) daily before breakfast by mouth.   lisinopril 5 MG tablet Commonly known as:  PRINIVIL,ZESTRIL TAKE 1 TABLET BY MOUTH  DAILY   metFORMIN 1000 MG tablet Commonly known as:  GLUCOPHAGE TAKE 1 TABLET BY MOUTH TWO  TIMES DAILY WITH A MEAL   metoprolol succinate 25 MG 24 hr tablet Commonly known as:  TOPROL-XL TAKE 1 TABLET BY MOUTH  DAILY   multivitamin tablet Take 1 tablet by mouth daily.   omega-3 acid ethyl esters 1 g capsule Commonly known as:  LOVAZA TAKE 2 CAPSULES BY MOUTH  TWO TIMES DAILY   ONETOUCH DELICA LANCETS 76E Misc Use to check blood sugar 3 times per day.   rivaroxaban 20 MG Tabs tablet Commonly known as:  XARELTO Take 1 tablet (20 mg total) by mouth daily with supper.   VITAMIN C PO Take 2 tablets by mouth 2 (two) times daily.   Vitamin D (Ergocalciferol) 50000 units Caps capsule Commonly known as:  DRISDOL Take 1 capsule (50,000 Units total) every 7 (seven) days by mouth.          Objective:   Physical Exam BP 124/68 (BP Location: Left Arm, Patient Position: Sitting, Cuff Size: Normal)   Pulse 81   Temp 98 F (36.7 C) (Oral)   Resp 14   Ht 6\' 1"  (1.854 m)   Wt 240 lb 2 oz (108.9 kg)   SpO2 98%   BMI 31.68 kg/m  General:   Well developed, well nourished . NAD.  Neck: No  thyromegaly  HEENT:  Normocephalic . Face symmetric, atraumatic Lungs:  CTA B Normal respiratory effort, no intercostal retractions, no accessory muscle use. Heart: RRR,  no murmur.  No pretibial edema bilaterally  Abdomen:  Not distended, soft, non-tender. No rebound or rigidity.   Skin: Exposed areas without rash. Not pale. Not jaundice Neurologic:  alert & oriented X3.  Speech normal, gait appropriate for age and unassisted Strength symmetric and appropriate for age.  Psych: Cognition  and judgment appear intact.  Cooperative with normal attention span and concentration.  Behavior appropriate. No anxious or depressed appearing.  Assessment & Plan:   Assessment  DM Dr. Dwyane Dee since 07-2014 Hyperlipidemia Hypothyroidism A flutter, ablation 12-2012, then  Paroxysmal Afib, s/p Maze-PFO closure-[place] 02-2016 GERD Depression Obesity Gout [name], CPAP since 2012  PLAN DM, hyperlipidemia, hypothyroidism, obesity: Recent labs reviewed, all very good, follow-up by Dr. Dwyane Dee and  now also by Dr. Leafy Ro at the bariatric clinic. H/o A.  flutter, S/P ablation, seems to be in sinus rhythm, no sxs, on Xarelto Depression: On Wellbutrin, + his stress at work but doing okay. Vitamin D deficiency: Per last labs, follow-up by Dr. Leafy Ro. RTC 1 year.

## 2017-04-06 NOTE — Patient Instructions (Signed)
GO TO THE LAB : Get the blood work     GO TO THE FRONT DESK Schedule your next appointment for a  Physical exam in 1 year 

## 2017-04-06 NOTE — Progress Notes (Signed)
Office: [phone]  /  Fax: [phone]   HPI:   Chief Complaint: OBESITY Colin Wood is here to discuss his progress with his obesity treatment plan. He is on the keep a food journal with 1500-1800 calories and 100 grams of protein daily and is following his eating plan approximately 25 % of the time. He states he is exercising 0 minutes 0 times per week. Colin Wood continues to do very well with weight loss efforts. He journals 20-50% of the time and tried to portion control and make smarter choices the rest of the time.  His weight is 238 lb (108 kg) today and has had a weight loss of 6 pounds over a period of 3 weeks since his last visit. He has lost 6 lbs since starting treatment with [place].  Diabetes II Colin Wood has a diagnosis of diabetes type II. Colin Wood discontinued glimepiride due to hypoglycemia. His fasting BGs generally range between 90-130's, unless he makes poor food choices. He still notes polyphagia. Last A1c was 6.6 on 02/10/17. He has been working on intensive lifestyle modifications including diet, exercise, and weight loss to help control his blood glucose levels.  At risk for cardiovascular disease Colin Wood is at a higher than average risk for cardiovascular disease due to obesity and diabetes II. He currently denies any chest pain.  Vitamin D deficiency Colin Wood has a diagnosis of vitamin D deficiency. He is stable on prescription Vit D, but not yet at goal. He denies nausea, vomiting or muscle weakness.  Hypothyroid Colin Wood has a diagnosis of hypothyroidism. He decreased levothyroxine dose as he was over replaced last labs . He denies hot or cold intolerance or palpitations, but does admit to excessive fatigue.  ALLERGIES: No Known Allergies  MEDICATIONS: Current Outpatient Medications on File Prior to Visit  Medication Sig Dispense Refill  . allopurinol (ZYLOPRIM) 300 MG tablet Take 1 tablet (300 mg total) daily by mouth. 90 tablet 3  . Ascorbic Acid (VITAMIN C PO) Take  2 tablets by mouth 2 (two) times daily.    [name] atorvastatin (LIPITOR) 10 MG tablet Take 1 tablet (10 mg total) by mouth daily. 90 tablet 0  . buPROPion (WELLBUTRIN SR) 200 MG 12 hr tablet Take 1 tablet (200 mg total) by mouth daily at 12 noon. 30 tablet 0  . canagliflozin (INVOKANA) 300 MG TABS tablet TAKE 1 TABLET BY MOUTH  DAILY BEFORE BREAKFAST 90 tablet 1  . docusate sodium (COLACE) 100 MG capsule Take 100 mg by mouth daily.    . famotidine (PEPCID) 20 MG tablet Take 1 tablet (20 mg total) at bedtime by mouth. 90 tablet 3  . fenofibrate 160 MG tablet Take 1 tablet (160 mg total) daily by mouth. 90 tablet 3  . glucose blood (ONE TOUCH ULTRA TEST) test strip Use to check blood sugar 3 times per day 300 each 2  . lisinopril (PRINIVIL,ZESTRIL) 5 MG tablet TAKE 1 TABLET BY MOUTH  DAILY 90 tablet 2  . metFORMIN (GLUCOPHAGE) 1000 MG tablet TAKE 1 TABLET BY MOUTH TWO  TIMES DAILY WITH A MEAL 180 tablet 1  . metoprolol succinate (TOPROL-XL) 25 MG 24 hr tablet TAKE 1 TABLET BY MOUTH  DAILY 90 tablet 0  . Multiple Vitamin (MULTIVITAMIN) tablet Take 1 tablet by mouth daily.    [name] omega-3 acid ethyl esters (LOVAZA) 1 g capsule TAKE 2 CAPSULES BY MOUTH  TWO TIMES DAILY 360 capsule 1  . ONETOUCH DELICA LANCETS 59F MISC Use to check blood sugar 3 times per day.  300 each 2  . rivaroxaban (XARELTO) 20 MG TABS tablet Take 1 tablet (20 mg total) by mouth daily with supper. 90 tablet 1   No current facility-administered medications on file prior to visit.     PAST MEDICAL HISTORY: Past Medical History:  Diagnosis Date  . Anxiety   . Atrial flutter ([place])    typical appearing; s/p ablation 12-2012  . Constipation   . Depression    used to see psych, on no meds as of 03/2009  . Diabetes mellitus    TYPE 2  . Fatty liver   . GERD (gastroesophageal reflux disease)   . Gout   . Hyperlipidemia   . Hypothyroidism   . Leg edema   . Obesity   . Persistent atrial fibrillation ([place])    PVI 12/2012  .  Prediabetes   . Rhinitis    vasomotor  . S/P Minimally invasive maze operation for atrial fibrillation 03/12/2016   Complete bilateral atrial lesion set using cryothermy and bipolar radiofrequency ablation with clipping of LA appendage via right mini thoracotomy approach  . S/P patent foramen ovale closure 03/12/2016  . Sleep apnea    mild, now treated with CPAP since ~9-12    PAST SURGICAL HISTORY: Past Surgical History:  Procedure Laterality Date  . ABLATION OF DYSRHYTHMIC FOCUS  01/05/2013   PVI and flutter ablation by [name]  . CARDIOVERSION  03/01/11  . NASAL SINUS SURGERY    . TONSILLECTOMY    . VASECTOMY      SOCIAL HISTORY: Social History   Tobacco Use  . Smoking status: Never Smoker  . Smokeless tobacco: Never Used  Substance Use Topics  . Alcohol use: No  . Drug use: No    FAMILY HISTORY: Family History  Problem Relation Age of Onset  . Asthma Mother   . Breast cancer Mother   . Dementia Mother   . Diabetes Father   . Hyperlipidemia Father   . Obesity Father   . Atrial fibrillation Father   . Hypertension Brother   . Melanoma Brother   . Allergies Daughter   . Throat cancer Maternal Uncle   . Colon cancer Paternal Grandfather   . Prostate cancer Neg Hx     ROS: Review of Systems  Constitutional: Positive for malaise/fatigue and weight loss.       Negative hot/cold intolerance  Cardiovascular: Negative for chest pain and palpitations.  Gastrointestinal: Negative for nausea and vomiting.  Musculoskeletal:       Negative muscle weakness  Endo/Heme/Allergies:       Positive hypoglycemia Positive polyphagia    PHYSICAL EXAM: Blood pressure 100/67, pulse 84, temperature 98.3 F (36.8 C), temperature source Oral, height 6\' 1"  (1.854 m), weight 238 lb (108 kg), SpO2 98 %. Body mass index is 31.4 kg/m. Physical Exam  Constitutional: He is oriented to person, place, and time. He appears well-developed and well-nourished.  Cardiovascular: Normal  rate.  Pulmonary/Chest: Effort normal.  Musculoskeletal: Normal range of motion.  Neurological: He is oriented to person, place, and time.  Skin: Skin is warm and dry.  Psychiatric: He has a normal mood and affect. His behavior is normal.  Vitals reviewed.   RECENT LABS AND TESTS: BMET    Component Value Date/Time   NA 143 02/10/2017 1131   K 4.6 02/10/2017 1131   CL 102 02/10/2017 1131   CO2 21 02/10/2017 1131   GLUCOSE 100 (H) 02/10/2017 1131   GLUCOSE 103 (H) 03/17/2016 0312   GLUCOSE  154 04/01/2009 0000   BUN 15 02/10/2017 1131   CREATININE 0.91 02/10/2017 1131   CALCIUM 10.2 02/10/2017 1131   GFRNONAA 93 02/10/2017 1131   GFRAA 108 02/10/2017 1131   Lab Results  Component Value Date   HGBA1C 6.6 (H) 02/10/2017   HGBA1C 6.8 11/08/2016   HGBA1C 6.1 (H) 04/27/2016   HGBA1C 7.5 03/02/2016   HGBA1C 7.0 (H) 04/14/2015   Lab Results  Component Value Date   INSULIN 15.6 02/10/2017   CBC    Component Value Date/Time   WBC 6.1 02/10/2017 1131   WBC 7.5 03/16/2016 0439   RBC 5.22 02/10/2017 1131   RBC 3.85 (L) 03/16/2016 0439   HGB 15.1 02/10/2017 1131   HCT 43.0 02/10/2017 1131   PLT 542 (A) 03/30/2016   PLT 192 01/05/2016 0815   MCV 82 02/10/2017 1131   MCH 28.9 02/10/2017 1131   MCH 29.1 03/16/2016 0439   MCHC 35.1 02/10/2017 1131   MCHC 35.0 03/16/2016 0439   RDW 13.5 02/10/2017 1131   LYMPHSABS 2.2 02/10/2017 1131   EOSABS 0.1 02/10/2017 1131   BASOSABS 0.0 02/10/2017 1131   Iron/TIBC/Ferritin/ %Sat No results found for: IRON, TIBC, FERRITIN, IRONPCTSAT Lipid Panel     Component Value Date/Time   CHOL 171 02/10/2017 1131   TRIG 102 02/10/2017 1131   HDL 47 02/10/2017 1131   CHOLHDL 3.9 11/10/2016 0945   LDLCALC 104 (H) 02/10/2017 1131   Hepatic Function Panel     Component Value Date/Time   PROT 7.3 02/10/2017 1131   ALBUMIN 5.4 02/10/2017 1131   AST 22 02/10/2017 1131   ALT 32 02/10/2017 1131   ALKPHOS 43 02/10/2017 1131   BILITOT 0.5  02/10/2017 1131      Component Value Date/Time   TSH 0.208 (L) 02/10/2017 1131   TSH 0.198 (L) 11/10/2016 0945   TSH 0.271 (L) 06/14/2016 0727    ASSESSMENT AND PLAN: Type 2 diabetes mellitus without complication, without long-term current use of insulin (HCC) - Plan: Dulaglutide (TRULICITY) 1.5 XI/3.3AS SOPN  Vitamin D deficiency - Plan: Vitamin D, Ergocalciferol, (DRISDOL) 50000 units CAPS capsule  Other specified hypothyroidism - Plan: levothyroxine (SYNTHROID, LEVOTHROID) 175 MCG tablet  At risk for heart disease  Class 1 obesity with serious comorbidity and body mass index (BMI) of 31.0 to 31.9 in adult, unspecified obesity type  PLAN:  Diabetes II Canio has been given extensive diabetes education by myself today including ideal fasting and post-prandial blood glucose readings, individual ideal Hgb A1c goals  and hypoglycemia prevention. We discussed the importance of good blood sugar control to decrease the likelihood of diabetic complications such as nephropathy, neuropathy, limb loss, blindness, coronary artery disease, and death. We discussed the importance of intensive lifestyle modification including diet, exercise and weight loss as the first line treatment for diabetes. Colin Wood agrees to increase trulicity to 1.5 mg qw #4 with no refills. Colin Wood agrees to follow up with our clinic in 3 weeks.  Cardiovascular risk counselling Colin Wood was given extended (15 minutes) coronary artery disease prevention counseling today. He is 56 y.o. male and has risk factors for heart disease including obesity and diabetes II. We discussed intensive lifestyle modifications today with an emphasis on specific weight loss instructions and strategies. Pt was also informed of the importance of increasing exercise and decreasing saturated fats to help prevent heart disease.  Vitamin D Deficiency Colin Wood was informed that low vitamin D levels contributes to fatigue and are associated with obesity,  breast, and colon  cancer. Colin Wood agrees to continue taking prescription Vit D @50 ,000 IU every week #4 and we will refill for 1 month. She will follow up for routine testing of vitamin D, at least 2-3 times per year. He was informed of the risk of over-replacement of vitamin D and agrees to not increase his dose unless he discusses this with [place] first. Colin Wood agrees to follow up with our clinic in 3 weeks.   Hypothyroid Colin Wood was informed of the importance of good thyroid control to help with weight loss efforts. He was also informed that supertheraputic thyroid levels are dangerous and will not improve weight loss results. Colin Wood agrees to continue taking levothyroxine 175 mcg q AM and we will refill for 1 month. We will recheck labs in 1 month and Colin Wood agrees to follow up with our clinic in 3 weeks.  Obesity Colin Wood is currently in the action stage of change. As such, his goal is to continue with weight loss efforts He has agreed to keep a food journal with 1500-1800 calories and 100 grams of protein daily Colin Wood has been instructed to work up to a goal of 150 minutes of combined cardio and strengthening exercise per week for weight loss and overall health benefits. We discussed the following Behavioral Modification Strategies today: increasing lean protein intake, decreasing simple carbohydrates, work on meal planning and easy cooking plans and holiday eating strategies    Colin Wood has agreed to follow up with our clinic in 3 weeks. He was informed of the importance of frequent follow up visits to maximize his success with intensive lifestyle modifications for his multiple health conditions.  I, Colin Wood, am acting as transcriptionist for Colin Nip, MD  I have reviewed the above documentation for accuracy and completeness, and I agree with the above. -Colin Nip, MD      Today's visit was # 4 out of 22.  Starting weight: 244 lbs Starting date: 02/10/17 Today's weight :  238 lbs  Today's date: 04/06/2017 Total lbs lost to date: 6 (Patients must lose 7 lbs in the first 6 months to continue with counseling)   ASK: We discussed the diagnosis of obesity with Colin Wood today and Colin Wood agreed to give [place] permission to discuss obesity behavioral modification therapy today.  ASSESS: Colin Wood has the diagnosis of obesity and his BMI today is 31.41 Colin Wood is in the action stage of change   ADVISE: Jabarri was educated on the multiple health risks of obesity as well as the benefit of weight loss to improve his health. He was advised of the need for long term treatment and the importance of lifestyle modifications.  AGREE: Multiple dietary modification options and treatment options were discussed and  Neale agreed to keep a food journal with 1500-1800 calories and 100 grams of protein daily We discussed the following Behavioral Modification Strategies today: increasing lean protein intake, decreasing simple carbohydrates, work on meal planning and easy cooking plans and holiday eating strategies

## 2017-04-06 NOTE — Progress Notes (Signed)
Pre visit review using our clinic review tool, if applicable. No additional management support is needed unless otherwise documented below in the visit note. 

## 2017-04-06 NOTE — Assessment & Plan Note (Signed)
DM, hyperlipidemia, hypothyroidism, obesity: Recent labs reviewed, all very good, follow-up by Dr. Dwyane Dee and  now also by Dr. Leafy Ro at the bariatric clinic. H/o A.  flutter, S/P ablation, seems to be in sinus rhythm, no sxs, on Xarelto Depression: On Wellbutrin, + his stress at work but doing okay. Vitamin D deficiency: Per last labs, follow-up by Dr. Leafy Ro. RTC 1 year.

## 2017-04-07 ENCOUNTER — Telehealth: Payer: Self-pay | Admitting: *Deleted

## 2017-04-07 LAB — PSA: PROSTATE SPECIFIC AG, SERUM: 1.2 ng/mL (ref 0.0–4.0)

## 2017-04-07 NOTE — Telephone Encounter (Signed)
Received results from [place]; forwarded to provider/SLS 11/08

## 2017-04-11 ENCOUNTER — Encounter: Payer: Self-pay | Admitting: Internal Medicine

## 2017-04-15 ENCOUNTER — Other Ambulatory Visit (INDEPENDENT_AMBULATORY_CARE_PROVIDER_SITE_OTHER): Payer: Self-pay | Admitting: Family Medicine

## 2017-04-15 DIAGNOSIS — F3289 Other specified depressive episodes: Secondary | ICD-10-CM

## 2017-04-20 ENCOUNTER — Ambulatory Visit: Payer: 59 | Admitting: Internal Medicine

## 2017-04-20 ENCOUNTER — Encounter: Payer: Self-pay | Admitting: Internal Medicine

## 2017-04-20 VITALS — BP 122/70 | HR 85 | Ht 73.0 in | Wt 233.6 lb

## 2017-04-20 DIAGNOSIS — E6609 Other obesity due to excess calories: Secondary | ICD-10-CM | POA: Diagnosis not present

## 2017-04-20 DIAGNOSIS — I4819 Other persistent atrial fibrillation: Secondary | ICD-10-CM

## 2017-04-20 DIAGNOSIS — I481 Persistent atrial fibrillation: Secondary | ICD-10-CM | POA: Diagnosis not present

## 2017-04-20 DIAGNOSIS — Z6830 Body mass index (BMI) 30.0-30.9, adult: Secondary | ICD-10-CM

## 2017-04-20 DIAGNOSIS — Z9889 Other specified postprocedural states: Secondary | ICD-10-CM | POA: Diagnosis not present

## 2017-04-20 DIAGNOSIS — Z8679 Personal history of other diseases of the circulatory system: Secondary | ICD-10-CM | POA: Diagnosis not present

## 2017-04-20 MED ORDER — METOPROLOL SUCCINATE ER 25 MG PO TB24: 25.0000 mg | ORAL_TABLET | Freq: Every day | ORAL | 0 refills | Status: DC

## 2017-04-20 MED ORDER — FAMOTIDINE 20 MG PO TABS: 20.0000 mg | ORAL_TABLET | Freq: Every day | ORAL | 3 refills | Status: DC

## 2017-04-20 MED ORDER — RIVAROXABAN 20 MG PO TABS: 20.0000 mg | ORAL_TABLET | Freq: Every day | ORAL | 1 refills | Status: DC

## 2017-04-20 NOTE — Progress Notes (Signed)
PCP: Colon Branch, MD   Primary EP: [name]  Colin Wood is a 56 y.o. male who presents today for routine electrophysiology followup.  Since last being seen in our clinic, the patient reports doing very well.   No afib since his MAZE.  he has begun a wellness program and has lost 15 lbs!  Today, he denies symptoms of palpitations, chest pain, shortness of breath,  lower extremity edema, dizziness, presyncope, or syncope.  The patient is otherwise without complaint today.   Past Medical History:  Diagnosis Date  . Anxiety   . Atrial flutter ([place])    typical appearing; s/p ablation 12-2012  . Constipation   . Depression    used to see psych, on no meds as of 03/2009  . Diabetes mellitus    TYPE 2  . Fatty liver   . GERD (gastroesophageal reflux disease)   . Gout   . Hyperlipidemia   . Hypothyroidism   . Leg edema   . Obesity   . Persistent atrial fibrillation ([place])    PVI 12/2012  . Prediabetes   . Rhinitis    vasomotor  . S/P Minimally invasive maze operation for atrial fibrillation 03/12/2016   Complete bilateral atrial lesion set using cryothermy and bipolar radiofrequency ablation with clipping of LA appendage via right mini thoracotomy approach  . S/P patent foramen ovale closure 03/12/2016  . Sleep apnea    mild, now treated with CPAP since ~9-12   Past Surgical History:  Procedure Laterality Date  . ABLATION OF DYSRHYTHMIC FOCUS  01/05/2013   PVI and flutter ablation by [name]  . ATRIAL FIBRILLATION ABLATION N/A 01/05/2013   Procedure: ATRIAL FIBRILLATION ABLATION;  Surgeon: Kram Grayer, MD;  Location: [facility] CATH LAB;  Service: Cardiovascular;  Laterality: N/A;  . CARDIAC CATHETERIZATION N/A 01/12/2016   Procedure: Right/Left Heart Cath and Coronary Angiography;  Surgeon: [name], MD;  Location: [place] CV LAB;  Service: Cardiovascular;  Laterality: N/A;  . CARDIOVERSION  03/01/11  . CLIPPING OF ATRIAL APPENDAGE Left 03/12/2016   Procedure: CLIPPING  OF ATRIAL APPENDAGE;  Surgeon: [name], MD;  Location: [place];  Service: Open Heart Surgery;  Laterality: Left;  . MINIMALLY INVASIVE MAZE PROCEDURE N/A 03/12/2016   Procedure: MINIMALLY INVASIVE MAZE PROCEDURE;  Surgeon: [name], MD;  Location: [place];  Service: Open Heart Surgery;  Laterality: N/A;  . NASAL SINUS SURGERY    . TEE WITHOUT CARDIOVERSION N/A 01/04/2013   Procedure: TRANSESOPHAGEAL ECHOCARDIOGRAM (TEE);  Surgeon: [name], MD;  Location: [facility] ENDOSCOPY;  Service: Cardiovascular;  Laterality: N/A;  . TEE WITHOUT CARDIOVERSION N/A 03/12/2016   Procedure: TRANSESOPHAGEAL ECHOCARDIOGRAM (TEE);  Surgeon: [name], MD;  Location: [place];  Service: Open Heart Surgery;  Laterality: N/A;  . TONSILLECTOMY    . VASECTOMY      ROS- all systems are reviewed and negatives except as per HPI above  Current Outpatient Medications  Medication Sig Dispense Refill  . allopurinol (ZYLOPRIM) 300 MG tablet Take 1 tablet (300 mg total) daily by mouth. 90 tablet 3  . Ascorbic Acid (VITAMIN C PO) Take 2 tablets by mouth 2 (two) times daily.    [name] atorvastatin (LIPITOR) 10 MG tablet Take 1 tablet (10 mg total) by mouth daily. 90 tablet 0  . buPROPion (WELLBUTRIN SR) 200 MG 12 hr tablet TAKE 1 TABLET (200 MG TOTAL) BY MOUTH DAILY AT 12 NOON. 30 tablet 0  . canagliflozin (INVOKANA) 300  MG TABS tablet TAKE 1 TABLET BY MOUTH  DAILY BEFORE BREAKFAST 90 tablet 1  . docusate sodium (COLACE) 100 MG capsule Take 100 mg by mouth daily.    . Dulaglutide (TRULICITY) 1.5 HE/5.2DP SOPN Inject 1.5 mg every 7 (seven) days into the skin. 4 pen 0  . famotidine (PEPCID) 20 MG tablet Take 1 tablet (20 mg total) at bedtime by mouth. 90 tablet 3  . fenofibrate 160 MG tablet Take 1 tablet (160 mg total) daily by mouth. 90 tablet 3  . glucose blood (ONE TOUCH ULTRA TEST) test strip Use to check blood sugar 3 times per day 300 each 2  . levothyroxine (SYNTHROID, LEVOTHROID) 175 MCG tablet Take 1 tablet (175  mcg total) daily before breakfast by mouth. 30 tablet 0  . lisinopril (PRINIVIL,ZESTRIL) 5 MG tablet TAKE 1 TABLET BY MOUTH  DAILY 90 tablet 2  . metFORMIN (GLUCOPHAGE) 1000 MG tablet TAKE 1 TABLET BY MOUTH TWO  TIMES DAILY WITH A MEAL 180 tablet 1  . metoprolol succinate (TOPROL-XL) 25 MG 24 hr tablet TAKE 1 TABLET BY MOUTH  DAILY 90 tablet 0  . Multiple Vitamin (MULTIVITAMIN) tablet Take 1 tablet by mouth daily.    [name] omega-3 acid ethyl esters (LOVAZA) 1 g capsule TAKE 2 CAPSULES BY MOUTH  TWO TIMES DAILY 360 capsule 1  . ONETOUCH DELICA LANCETS 82U MISC Use to check blood sugar 3 times per day. 300 each 2  . rivaroxaban (XARELTO) 20 MG TABS tablet Take 1 tablet (20 mg total) by mouth daily with supper. 90 tablet 1  . Vitamin D, Ergocalciferol, (DRISDOL) 50000 units CAPS capsule Take 1 capsule (50,000 Units total) every 7 (seven) days by mouth. 4 capsule 0   No current facility-administered medications for this visit.     Physical Exam: Vitals:   04/20/17 1531  BP: 122/70  Pulse: 85  SpO2: 97%  Weight: 233 lb 9.6 oz (106 kg)  Height: 6\' 1"  (1.854 m)    GEN- The patient is well appearing, alert and oriented x 3 today.   Head- normocephalic, atraumatic Eyes-  Sclera clear, conjunctiva pink Ears- hearing intact Oropharynx- clear Lungs- Clear to ausculation bilaterally, normal work of breathing Heart- Regular rate and rhythm, no murmurs, rubs or gallops, PMI not laterally displaced GI- soft, NT, ND, + BS Extremities- no clubbing, cyanosis, or edema  EKG tracing ordered today is personally reviewed and shows sinus rhythm 85 bpm, RBBB  Assessment and Plan:  1. Persistent afib Well controlled off AAD therapy post MAZE chads2vasc score is 2.  Continue on xarelto  2. [name] Stable  3. Obesity Body mass index is 30.82 kg/m. Making significant improvement with wellness coaching  4. Chronic systolic dysfunction EF has normalized with sinus rhythm  Return to see me in 6  months  Tijerino Grayer MD, [facility] 04/20/2017 3:56 PM

## 2017-04-20 NOTE — Patient Instructions (Addendum)
Medication Instructions:  Your physician recommends that you continue on your current medications as directed. Please refer to the Current Medication list given to you today.  -- If you need a refill on your cardiac medications before your next appointment, please call your pharmacy. --  Labwork: None ordered  Testing/Procedures: None ordered  Follow-Up:  Your physician wants you to follow-up in: 6 months with [name].     You will receive a reminder letter in the mail two months in advance. If you don't receive a letter, please call our office to schedule the follow-up appointment.  Thank you for choosing [facility]!!   Frederik Schmidt, RN [phone]  Any Other Special Instructions Will Be Listed Below (If Applicable).

## 2017-04-26 ENCOUNTER — Ambulatory Visit (INDEPENDENT_AMBULATORY_CARE_PROVIDER_SITE_OTHER): Payer: 59 | Admitting: Physician Assistant

## 2017-04-26 VITALS — BP 111/71 | HR 83 | Temp 98.5°F | Ht 73.0 in | Wt 226.0 lb

## 2017-04-26 DIAGNOSIS — E669 Obesity, unspecified: Secondary | ICD-10-CM | POA: Diagnosis not present

## 2017-04-26 DIAGNOSIS — E119 Type 2 diabetes mellitus without complications: Secondary | ICD-10-CM

## 2017-04-26 DIAGNOSIS — Z6830 Body mass index (BMI) 30.0-30.9, adult: Secondary | ICD-10-CM | POA: Diagnosis not present

## 2017-04-26 NOTE — Progress Notes (Signed)
Office: [phone]  /  Fax: [phone]   HPI:   Chief Complaint: OBESITY Colin Wood is here to discuss his progress with his obesity treatment plan. He is on the keep a food journal with 1500-1800 calories and 100 grams of protein daily and is following his eating plan approximately 75 % of the time. He states he is exercising 0 minutes 0 times per week. Colin Wood continue to do well with weight loss. He has been incorporating variety into his meals. He states he would like more meal planning ideas.  His weight is 226 lb (102.5 kg) today and has had a weight loss of 12 pounds over a period of 3 weeks since his last visit. He has lost 18 lbs since starting treatment with [place].  Diabetes II Colin Wood has a diagnosis of diabetes type II. Colin Wood states he has not checked his fasting BGs. Fasting BGs range between 90-130's and he denies any hypoglycemic episodes. Last A1c was 6.6 on 02/10/17. He has been working on intensive lifestyle modifications including diet, exercise, and weight loss to help control his blood glucose levels.  ALLERGIES: No Known Allergies  MEDICATIONS: Current Outpatient Medications on File Prior to Visit  Medication Sig Dispense Refill  . allopurinol (ZYLOPRIM) 300 MG tablet Take 1 tablet (300 mg total) daily by mouth. 90 tablet 3  . Ascorbic Acid (VITAMIN C PO) Take 2 tablets by mouth 2 (two) times daily.    [name] atorvastatin (LIPITOR) 10 MG tablet Take 1 tablet (10 mg total) by mouth daily. 90 tablet 0  . buPROPion (WELLBUTRIN SR) 200 MG 12 hr tablet TAKE 1 TABLET (200 MG TOTAL) BY MOUTH DAILY AT 12 NOON. 30 tablet 0  . canagliflozin (INVOKANA) 300 MG TABS tablet TAKE 1 TABLET BY MOUTH  DAILY BEFORE BREAKFAST 90 tablet 1  . docusate sodium (COLACE) 100 MG capsule Take 100 mg by mouth daily.    . Dulaglutide (TRULICITY) 1.5 DU/2.0UR SOPN Inject 1.5 mg every 7 (seven) days into the skin. 4 pen 0  . famotidine (PEPCID) 20 MG tablet Take 1 tablet (20 mg total) by mouth at  bedtime. 90 tablet 3  . fenofibrate 160 MG tablet Take 1 tablet (160 mg total) daily by mouth. 90 tablet 3  . glucose blood (ONE TOUCH ULTRA TEST) test strip Use to check blood sugar 3 times per day 300 each 2  . levothyroxine (SYNTHROID, LEVOTHROID) 175 MCG tablet Take 1 tablet (175 mcg total) daily before breakfast by mouth. 30 tablet 0  . lisinopril (PRINIVIL,ZESTRIL) 5 MG tablet TAKE 1 TABLET BY MOUTH  DAILY 90 tablet 2  . metFORMIN (GLUCOPHAGE) 1000 MG tablet TAKE 1 TABLET BY MOUTH TWO  TIMES DAILY WITH A MEAL 180 tablet 1  . metoprolol succinate (TOPROL-XL) 25 MG 24 hr tablet Take 1 tablet (25 mg total) by mouth daily. 90 tablet 0  . Multiple Vitamin (MULTIVITAMIN) tablet Take 1 tablet by mouth daily.    [name] omega-3 acid ethyl esters (LOVAZA) 1 g capsule TAKE 2 CAPSULES BY MOUTH  TWO TIMES DAILY 360 capsule 1  . ONETOUCH DELICA LANCETS 42H MISC Use to check blood sugar 3 times per day. 300 each 2  . rivaroxaban (XARELTO) 20 MG TABS tablet Take 1 tablet (20 mg total) by mouth daily with supper. 90 tablet 1  . Vitamin D, Ergocalciferol, (DRISDOL) 50000 units CAPS capsule Take 1 capsule (50,000 Units total) every 7 (seven) days by mouth. 4 capsule 0   No current facility-administered medications on file  prior to visit.     PAST MEDICAL HISTORY: Past Medical History:  Diagnosis Date  . Anxiety   . Atrial flutter ([place])    typical appearing; s/p ablation 12-2012  . Constipation   . Depression    used to see psych, on no meds as of 03/2009  . Diabetes mellitus    TYPE 2  . Fatty liver   . GERD (gastroesophageal reflux disease)   . Gout   . Hyperlipidemia   . Hypothyroidism   . Leg edema   . Obesity   . Persistent atrial fibrillation ([place])    PVI 12/2012  . Prediabetes   . Rhinitis    vasomotor  . S/P Minimally invasive maze operation for atrial fibrillation 03/12/2016   Complete bilateral atrial lesion set using cryothermy and bipolar radiofrequency ablation with clipping of LA  appendage via right mini thoracotomy approach  . S/P patent foramen ovale closure 03/12/2016  . Sleep apnea    mild, now treated with CPAP since ~9-12    PAST SURGICAL HISTORY: Past Surgical History:  Procedure Laterality Date  . ABLATION OF DYSRHYTHMIC FOCUS  01/05/2013   PVI and flutter ablation by [name]  . ATRIAL FIBRILLATION ABLATION N/A 01/05/2013   Procedure: ATRIAL FIBRILLATION ABLATION;  Surgeon: Tena Grayer, MD;  Location: [facility] CATH LAB;  Service: Cardiovascular;  Laterality: N/A;  . CARDIAC CATHETERIZATION N/A 01/12/2016   Procedure: Right/Left Heart Cath and Coronary Angiography;  Surgeon: [name], MD;  Location: [place] CV LAB;  Service: Cardiovascular;  Laterality: N/A;  . CARDIOVERSION  03/01/11  . CLIPPING OF ATRIAL APPENDAGE Left 03/12/2016   Procedure: CLIPPING OF ATRIAL APPENDAGE;  Surgeon: [name], MD;  Location: [place];  Service: Open Heart Surgery;  Laterality: Left;  . MINIMALLY INVASIVE MAZE PROCEDURE N/A 03/12/2016   Procedure: MINIMALLY INVASIVE MAZE PROCEDURE;  Surgeon: [name], MD;  Location: [place];  Service: Open Heart Surgery;  Laterality: N/A;  . NASAL SINUS SURGERY    . TEE WITHOUT CARDIOVERSION N/A 01/04/2013   Procedure: TRANSESOPHAGEAL ECHOCARDIOGRAM (TEE);  Surgeon: [name], MD;  Location: [name] ENDOSCOPY;  Service: Cardiovascular;  Laterality: N/A;  . TEE WITHOUT CARDIOVERSION N/A 03/12/2016   Procedure: TRANSESOPHAGEAL ECHOCARDIOGRAM (TEE);  Surgeon: [name], MD;  Location: [place];  Service: Open Heart Surgery;  Laterality: N/A;  . TONSILLECTOMY    . VASECTOMY      SOCIAL HISTORY: Social History   Tobacco Use  . Smoking status: Never Smoker  . Smokeless tobacco: Never Used  Substance Use Topics  . Alcohol use: No  . Drug use: No    FAMILY HISTORY: Family History  Problem Relation Age of Onset  . Asthma Mother   . Breast cancer Mother   . Dementia Mother   . Diabetes Father   . Hyperlipidemia Father     . Obesity Father   . Atrial fibrillation Father   . Hypertension Brother   . Melanoma Brother   . Allergies Daughter   . Throat cancer Maternal Uncle   . Colon cancer Paternal Grandfather   . Prostate cancer Neg Hx     ROS: Review of Systems  Constitutional: Positive for weight loss.  Endo/Heme/Allergies:       Negative hypoglycemia    PHYSICAL EXAM: Blood pressure 111/71, pulse 83, temperature 98.5 F (36.9 C), temperature source Oral, height 6\' 1"  (1.854 m), weight 226 lb (102.5 kg), SpO2 98 %. Body mass index is 29.82 kg/m. Physical Exam  Constitutional: He is oriented to person, place, and time. He appears well-developed and well-nourished.  Cardiovascular: Normal rate.  Pulmonary/Chest: Effort normal.  Musculoskeletal: Normal range of motion.  Neurological: He is oriented to person, place, and time.  Skin: Skin is warm and dry.  Psychiatric: He has a normal mood and affect. His behavior is normal.  Vitals reviewed.   RECENT LABS AND TESTS: BMET    Component Value Date/Time   NA 143 02/10/2017 1131   K 4.6 02/10/2017 1131   CL 102 02/10/2017 1131   CO2 21 02/10/2017 1131   GLUCOSE 100 (H) 02/10/2017 1131   GLUCOSE 103 (H) 03/17/2016 0312   GLUCOSE 154 04/01/2009 0000   BUN 15 02/10/2017 1131   CREATININE 0.91 02/10/2017 1131   CALCIUM 10.2 02/10/2017 1131   GFRNONAA 93 02/10/2017 1131   GFRAA 108 02/10/2017 1131   Lab Results  Component Value Date   HGBA1C 6.6 (H) 02/10/2017   HGBA1C 6.8 11/08/2016   HGBA1C 6.1 (H) 04/27/2016   HGBA1C 7.5 03/02/2016   HGBA1C 7.0 (H) 04/14/2015   Lab Results  Component Value Date   INSULIN 15.6 02/10/2017   CBC    Component Value Date/Time   WBC 6.1 02/10/2017 1131   WBC 7.5 03/16/2016 0439   RBC 5.22 02/10/2017 1131   RBC 3.85 (L) 03/16/2016 0439   HGB 15.1 02/10/2017 1131   HCT 43.0 02/10/2017 1131   PLT 542 (A) 03/30/2016   PLT 192 01/05/2016 0815   MCV 82 02/10/2017 1131   MCH 28.9 02/10/2017 1131    MCH 29.1 03/16/2016 0439   MCHC 35.1 02/10/2017 1131   MCHC 35.0 03/16/2016 0439   RDW 13.5 02/10/2017 1131   LYMPHSABS 2.2 02/10/2017 1131   EOSABS 0.1 02/10/2017 1131   BASOSABS 0.0 02/10/2017 1131   Iron/TIBC/Ferritin/ %Sat No results found for: IRON, TIBC, FERRITIN, IRONPCTSAT Lipid Panel     Component Value Date/Time   CHOL 171 02/10/2017 1131   TRIG 102 02/10/2017 1131   HDL 47 02/10/2017 1131   CHOLHDL 3.9 11/10/2016 0945   LDLCALC 104 (H) 02/10/2017 1131   Hepatic Function Panel     Component Value Date/Time   PROT 7.3 02/10/2017 1131   ALBUMIN 5.4 02/10/2017 1131   AST 22 02/10/2017 1131   ALT 32 02/10/2017 1131   ALKPHOS 43 02/10/2017 1131   BILITOT 0.5 02/10/2017 1131      Component Value Date/Time   TSH 0.208 (L) 02/10/2017 1131   TSH 0.198 (L) 11/10/2016 0945   TSH 0.271 (L) 06/14/2016 0727    ASSESSMENT AND PLAN: Type 2 diabetes mellitus without complication, without long-term current use of insulin (HCC)  Class 1 obesity with serious comorbidity and body mass index (BMI) of 30.0 to 30.9 in adult, unspecified obesity type - Starting BMI greater then 30  PLAN:  Diabetes II Colin Wood has been given extensive diabetes education by myself today including ideal fasting and post-prandial blood glucose readings, individual ideal Hgb A1c goals and hypoglycemia prevention. We discussed the importance of good blood sugar control to decrease the likelihood of diabetic complications such as nephropathy, neuropathy, limb loss, blindness, coronary artery disease, and death. We discussed the importance of intensive lifestyle modification including diet, exercise and weight loss as the first line treatment for diabetes. Colin Wood agrees to continue his diabetes medications as prescribed and he will follow up with our clinic in 3 weeks.  We spent > than 50% of the 15 minute visit on the counseling as  documented in the note.  Obesity Colin Wood is currently in the action stage  of change. As such, his goal is to continue with weight loss efforts He has agreed to keep a food journal with 1500-1800 calories and 100 grams of protein daily Colin Wood has been instructed to work up to a goal of 150 minutes of combined cardio and strengthening exercise per week for weight loss and overall health benefits. We discussed the following Behavioral Modification Strategies today: increasing lean protein intake and work on meal planning and easy cooking plans   Colin Wood has agreed to follow up with our clinic in 3 weeks. He was informed of the importance of frequent follow up visits to maximize his success with intensive lifestyle modifications for his multiple health conditions.  I, Colin Wood, am acting as transcriptionist for Colin Duverney, PA-C  I have reviewed the above documentation for accuracy and completeness, and I agree with the above. -Colin Duverney, PA-C  I have reviewed the above note and agree with the plan. -Colin Nip, MD     Today's visit was # 5 out of 22.  Starting weight: 244 lbs Starting date: 02/10/17 Today's weight : 226 lbs  Today's date: 04/26/2017 Total lbs lost to date: 67 (Patients must lose 7 lbs in the first 6 months to continue with counseling)   ASK: We discussed the diagnosis of obesity with Colin Wood today and Colin Wood agreed to give [place] permission to discuss obesity behavioral modification therapy today.  ASSESS: Colin Wood has the diagnosis of obesity and his BMI today is 29.82 Colin Wood is in the action stage of change   ADVISE: Colin Wood was educated on the multiple health risks of obesity as well as the benefit of weight loss to improve his health. He was advised of the need for long term treatment and the importance of lifestyle modifications.  AGREE: Multiple dietary modification options and treatment options were discussed and  Colin Wood agreed to keep a food journal with 1500-1800 calories and 100 grams of protein daily We  discussed the following Behavioral Modification Strategies today: increasing lean protein intake and work on meal planning and easy cooking plans

## 2017-04-30 ENCOUNTER — Other Ambulatory Visit (INDEPENDENT_AMBULATORY_CARE_PROVIDER_SITE_OTHER): Payer: Self-pay | Admitting: Family Medicine

## 2017-04-30 DIAGNOSIS — E559 Vitamin D deficiency, unspecified: Secondary | ICD-10-CM

## 2017-05-02 ENCOUNTER — Other Ambulatory Visit (INDEPENDENT_AMBULATORY_CARE_PROVIDER_SITE_OTHER): Payer: Self-pay | Admitting: Family Medicine

## 2017-05-02 DIAGNOSIS — E559 Vitamin D deficiency, unspecified: Secondary | ICD-10-CM

## 2017-05-02 MED ORDER — VITAMIN D (ERGOCALCIFEROL) 1.25 MG (50000 UNIT) PO CAPS: 50000.0000 [IU] | ORAL_CAPSULE | ORAL | 0 refills | Status: DC

## 2017-05-10 ENCOUNTER — Ambulatory Visit: Payer: Self-pay | Admitting: Endocrinology

## 2017-05-10 ENCOUNTER — Other Ambulatory Visit: Payer: Self-pay | Admitting: Endocrinology

## 2017-05-10 DIAGNOSIS — E1165 Type 2 diabetes mellitus with hyperglycemia: Secondary | ICD-10-CM | POA: Diagnosis not present

## 2017-05-11 LAB — BASIC METABOLIC PANEL
BUN / CREAT RATIO: 21 — AB (ref 9–20)
BUN: 24 mg/dL (ref 6–24)
CALCIUM: 10.2 mg/dL (ref 8.7–10.2)
CHLORIDE: 102 mmol/L (ref 96–106)
CO2: 25 mmol/L (ref 20–29)
CREATININE: 1.14 mg/dL (ref 0.76–1.27)
GFR calc Af Amer: 82 mL/min/{1.73_m2} (ref >59)
GFR calc non Af Amer: 71 mL/min/{1.73_m2} (ref >59)
GLUCOSE: 106 mg/dL — AB (ref 65–99)
Potassium: 5.1 mmol/L (ref 3.5–5.2)
Sodium: 141 mmol/L (ref 134–144)

## 2017-05-11 LAB — HGB A1C W/O EAG: HEMOGLOBIN A1C: 5.8 % — AB (ref 4.8–5.6)

## 2017-05-11 LAB — TSH: TSH: 0.417 u[IU]/mL — ABNORMAL LOW (ref 0.450–4.500)

## 2017-05-16 ENCOUNTER — Ambulatory Visit (INDEPENDENT_AMBULATORY_CARE_PROVIDER_SITE_OTHER): Payer: 59 | Admitting: Endocrinology

## 2017-05-16 ENCOUNTER — Ambulatory Visit (INDEPENDENT_AMBULATORY_CARE_PROVIDER_SITE_OTHER): Payer: 59 | Admitting: Physician Assistant

## 2017-05-16 ENCOUNTER — Encounter: Payer: Self-pay | Admitting: Endocrinology

## 2017-05-16 ENCOUNTER — Other Ambulatory Visit (INDEPENDENT_AMBULATORY_CARE_PROVIDER_SITE_OTHER): Payer: Self-pay | Admitting: Family Medicine

## 2017-05-16 VITALS — BP 117/72 | HR 80 | Temp 98.1°F | Ht 73.0 in | Wt 220.0 lb

## 2017-05-16 VITALS — BP 110/78 | HR 79 | Ht 73.0 in | Wt 223.0 lb

## 2017-05-16 DIAGNOSIS — E669 Obesity, unspecified: Secondary | ICD-10-CM

## 2017-05-16 DIAGNOSIS — Z9189 Other specified personal risk factors, not elsewhere classified: Secondary | ICD-10-CM | POA: Diagnosis not present

## 2017-05-16 DIAGNOSIS — E559 Vitamin D deficiency, unspecified: Secondary | ICD-10-CM

## 2017-05-16 DIAGNOSIS — Z6830 Body mass index (BMI) 30.0-30.9, adult: Secondary | ICD-10-CM

## 2017-05-16 DIAGNOSIS — F3289 Other specified depressive episodes: Secondary | ICD-10-CM

## 2017-05-16 DIAGNOSIS — E038 Other specified hypothyroidism: Secondary | ICD-10-CM

## 2017-05-16 DIAGNOSIS — E1165 Type 2 diabetes mellitus with hyperglycemia: Secondary | ICD-10-CM | POA: Diagnosis not present

## 2017-05-16 DIAGNOSIS — E119 Type 2 diabetes mellitus without complications: Secondary | ICD-10-CM | POA: Diagnosis not present

## 2017-05-16 MED ORDER — LEVOTHYROXINE SODIUM 175 MCG PO TABS: 175.0000 ug | ORAL_TABLET | Freq: Every day | ORAL | 2 refills | Status: DC

## 2017-05-16 MED ORDER — BUPROPION HCL ER (SR) 200 MG PO TB12: 200.0000 mg | ORAL_TABLET | Freq: Every day | ORAL | 0 refills | Status: DC

## 2017-05-16 MED ORDER — DULAGLUTIDE 1.5 MG/0.5ML ~~LOC~~ SOAJ: SUBCUTANEOUS | 1 refills | Status: DC

## 2017-05-16 MED ORDER — VITAMIN D (ERGOCALCIFEROL) 1.25 MG (50000 UNIT) PO CAPS: 50000.0000 [IU] | ORAL_CAPSULE | ORAL | 0 refills | Status: DC

## 2017-05-16 NOTE — Progress Notes (Addendum)
Patient ID: Colin Wood, male   DOB: 1961-03-03, 56 y.o.   MRN: [id number]           Reason for Appointment: Follow-up for Type 2 Diabetes  Referring physician: Larose Kells  History of Present Illness:          Diagnosis: Type 2 diabetes mellitus, date of diagnosis: ?  2011        Past history: He was not having any symptoms at the time of diagnosis and not clear what his initial blood sugars were He was started on metformin initially and had fairly good control Subsequently with higher sugars he was given Amaryl also in addition which has been continued.  A1c was 10.6% in 2012 Subsequently in 2014 his A1c was down to 6.5 but he had a regular follow-up subsequently Because of an A1c of 8.1 in 02/2014 he was given Tradjenta in addition to the above drugs but this was not effective  He was referred here because of a high A1c of 7.9 in 07/2014  Recent history:   Oral hypoglycemic drugs the patient is taking are: metformin 1 g twice a day, Trulicity 1.5 mg weekly, Invokana 300 mg daily      A1c has improved significantly from before, now 5.8 compared to 6.6  Current management, blood sugar values and problems identified:  He was started on Trulicity because of his difficulty with controlling portions, snacks and stress eating  He was started on 0.75 and this was increased by his weight loss specialist to 1.5 mg last month  He says occasionally has had vomiting especially with the initial change but this is more related to his overeating and for the last 3-4 weeks has not had any significant nausea or vomiting with controlling portions  He has had marked improvement in his weight  Also not taking AMARYL now  His blood sugars are excellent throughout the day and he is doing very well with checking blood sugars at least once a day including in the evening after eating   Highest reading was only once at night after dinner when he was not eating well  Currently has not exercised because  of busy work schedule         Side effects from medications have been:None  Compliance with the medical regimen: Fair   Glucose monitoring:  done occasionally        Glucometer:   One Touch.      Blood Glucose readings by review of monitor:   Mean values apply above for all meters except median for One Touch  PRE-MEAL Fasting Lunch Dinner Bedtime Overall  Glucose range:  99-130    9 6-1 85   Mean/median: 111 100    118  112     previous blood sugar readings:  Mean values apply above for all meters except median for One Touch  PRE-MEAL Fasting Lunch Dinner Bedtime Overall  Glucose range: 127-179       Mean/median: 138     134    POST-MEAL PC Breakfast PC Lunch PC Dinner  Glucose range: 89-239    Mean/median:   71-214       Self-care: The diet that the patient has been following is: None, he has difficulty controlling portions     Meals: 3 meals per day. His breakfast  is frequently a biscuit at a fast food restaurant.   He is snacking on apples and yogurt  snacks  Exercise: No programmed exercise           Dietician visit, most recent: none, previously had gone to a class          Weight history: Previous range 260-280  Wt Readings from Last 3 Encounters:  05/16/17 223 lb (101.2 kg)  04/26/17 226 lb (102.5 kg)  04/20/17 233 lb 9.6 oz (106 kg)    Glycemic control:   Lab Results  Component Value Date   HGBA1C 5.8 (H) 05/10/2017   HGBA1C 6.6 (H) 02/10/2017   HGBA1C 6.8 11/08/2016   Lab Results  Component Value Date   LDLCALC 104 (H) 02/10/2017   CREATININE 1.14 05/10/2017      Allergies as of 05/16/2017   No Known Allergies     Medication List        Accurate as of 05/16/17  8:21 AM. Always use your most recent med list.          allopurinol 300 MG tablet Commonly known as:  ZYLOPRIM Take 1 tablet (300 mg total) daily by mouth.   atorvastatin 10 MG tablet Commonly known as:  LIPITOR Take 1 tablet (10 mg total) by mouth daily.     buPROPion 200 MG 12 hr tablet Commonly known as:  WELLBUTRIN SR TAKE 1 TABLET (200 MG TOTAL) BY MOUTH DAILY AT 12 NOON.   canagliflozin 300 MG Tabs tablet Commonly known as:  INVOKANA TAKE 1 TABLET BY MOUTH  DAILY BEFORE BREAKFAST   docusate sodium 100 MG capsule Commonly known as:  COLACE Take 100 mg by mouth daily.   Dulaglutide 1.5 MG/0.5ML Sopn Commonly known as:  TRULICITY Inject 1.5 mg every 7 (seven) days into the skin.   famotidine 20 MG tablet Commonly known as:  PEPCID Take 1 tablet (20 mg total) by mouth at bedtime.   fenofibrate 160 MG tablet Take 1 tablet (160 mg total) daily by mouth.   glucose blood test strip Commonly known as:  ONE TOUCH ULTRA TEST Use to check blood sugar 3 times per day   levothyroxine 175 MCG tablet Commonly known as:  SYNTHROID, LEVOTHROID Take 1 tablet (175 mcg total) daily before breakfast by mouth.   lisinopril 5 MG tablet Commonly known as:  PRINIVIL,ZESTRIL TAKE 1 TABLET BY MOUTH  DAILY   metFORMIN 1000 MG tablet Commonly known as:  GLUCOPHAGE TAKE 1 TABLET BY MOUTH TWO  TIMES DAILY WITH A MEAL   metoprolol succinate 25 MG 24 hr tablet Commonly known as:  TOPROL-XL Take 1 tablet (25 mg total) by mouth daily.   multivitamin tablet Take 1 tablet by mouth daily.   omega-3 acid ethyl esters 1 g capsule Commonly known as:  LOVAZA TAKE 2 CAPSULES BY MOUTH  TWO TIMES DAILY   ONETOUCH DELICA LANCETS 84Z Misc Use to check blood sugar 3 times per day.   rivaroxaban 20 MG Tabs tablet Commonly known as:  XARELTO Take 1 tablet (20 mg total) by mouth daily with supper.   VITAMIN C PO Take 2 tablets by mouth 2 (two) times daily.   Vitamin D (Ergocalciferol) 50000 units Caps capsule Commonly known as:  DRISDOL Take 1 capsule (50,000 Units total) by mouth every 7 (seven) days.       Allergies:  No Known Allergies  Past Medical History:  Diagnosis Date  . Anxiety   . Atrial flutter ([place])    typical appearing; s/p  ablation 12-2012  . Constipation   . Depression    used to see psych, on  no meds as of 03/2009  . Diabetes mellitus    TYPE 2  . Fatty liver   . GERD (gastroesophageal reflux disease)   . Gout   . Hyperlipidemia   . Hypothyroidism   . Leg edema   . Obesity   . Persistent atrial fibrillation ([place])    PVI 12/2012  . Prediabetes   . Rhinitis    vasomotor  . S/P Minimally invasive maze operation for atrial fibrillation 03/12/2016   Complete bilateral atrial lesion set using cryothermy and bipolar radiofrequency ablation with clipping of LA appendage via right mini thoracotomy approach  . S/P patent foramen ovale closure 03/12/2016  . Sleep apnea    mild, now treated with CPAP since ~9-12    Past Surgical History:  Procedure Laterality Date  . ABLATION OF DYSRHYTHMIC FOCUS  01/05/2013   PVI and flutter ablation by [name]  . ATRIAL FIBRILLATION ABLATION N/A 01/05/2013   Procedure: ATRIAL FIBRILLATION ABLATION;  Surgeon: Tamura Grayer, MD;  Location[facility] CATH LAB;  Service: Cardiovascular;  Laterality: N/A;  . CARDIAC CATHETERIZATION N/A 01/12/2016   Procedure: Right/Left Heart Cath and Coronary Angiography;  Surgeon: [name], MD;  Location: [place] CV LAB;  Service: Cardiovascular;  Laterality: N/A;  . CARDIOVERSION  03/01/11  . CLIPPING OF ATRIAL APPENDAGE Left 03/12/2016   Procedure: CLIPPING OF ATRIAL APPENDAGE;  Surgeon: [name], MD;  Location: [place];  Service: Open Heart Surgery;  Laterality: Left;  . MINIMALLY INVASIVE MAZE PROCEDURE N/A 03/12/2016   Procedure: MINIMALLY INVASIVE MAZE PROCEDURE;  Surgeon: [name], MD;  Location: [place];  Service: Open Heart Surgery;  Laterality: N/A;  . NASAL SINUS SURGERY    . TEE WITHOUT CARDIOVERSION N/A 01/04/2013   Procedure: TRANSESOPHAGEAL ECHOCARDIOGRAM (TEE);  Surgeon: [name], MD;  Location: [facility] ENDOSCOPY;  Service: Cardiovascular;  Laterality: N/A;  . TEE WITHOUT CARDIOVERSION N/A 03/12/2016   Procedure:  TRANSESOPHAGEAL ECHOCARDIOGRAM (TEE);  Surgeon: [name], MD;  Location: [place];  Service: Open Heart Surgery;  Laterality: N/A;  . TONSILLECTOMY    . VASECTOMY      Family History  Problem Relation Age of Onset  . Asthma Mother   . Breast cancer Mother   . Dementia Mother   . Diabetes Father   . Hyperlipidemia Father   . Obesity Father   . Atrial fibrillation Father   . Hypertension Brother   . Melanoma Brother   . Allergies Daughter   . Throat cancer Maternal Uncle   . Colon cancer Paternal Grandfather   . Prostate cancer Neg Hx     Social History:  reports that  has never smoked. he has never used smokeless tobacco. He reports that he does not drink alcohol or use drugs.    Review of Systems         Lipids:  has high triglycerides mostly previously as high as 681; LDL previously also consistently high.   On Fenofibrate and taking Lipitor 10 mg daily  Has not had his follow-up fasting labs recently       Lab Results  Component Value Date   CHOL 171 02/10/2017   HDL 47 02/10/2017   LDLCALC 104 (H) 02/10/2017   TRIG 102 02/10/2017   CHOLHDL 3.9 11/10/2016                   Thyroid: He was diagnosed to have hypothyroidism in 1994 has been on medication since then  He is compliant with  his Synthroid and  has been on generic  He has had requirements lower than before  Has been on 175 micrograms dosage more recently with slight improvement in his TSH level, this was done from [facility] No shakiness or palpitations   Lab Results  Component Value Date   TSH 0.417 (L) 05/10/2017   TSH 0.208 (L) 02/10/2017   TSH 0.198 (L) 11/10/2016   FREET4 1.93 (H) 02/10/2017   FREET4 1.78 (H) 06/14/2016       The blood pressure has been Treated with low-dose lisinopril and metoprolol No lightheadedness  BP Readings from Last 3 Encounters:  05/16/17 110/78  04/26/17 111/71  04/20/17 122/70      Physical Examination:  BP 110/78   Pulse 79   Ht 6\' 1"  (1.854  m)   Wt 223 lb (101.2 kg)   SpO2 97%   BMI 29.42 kg/m     ASSESSMENT:  Diabetes type 2, uncontrolled with obesity He is on a 3 drug regimen of metformin, Trulicity and Invokana 301 mg  See history of present illness for detailed discussion of his current management, blood sugar patterns and problems identified  He has an excellent A1c of 5.8, lower than usual Also has been able to maintain weight loss with taking Trulicity 1.5 mg along with working with the weight loss program However still not exercising Blood sugars are fairly good at home most of the time  HYPERTRIGLYCERIDEMIA:  improved but LDL is still slightly high recently, target 100   HYPOTHYROIDISM: TSH has been low, this is now nearly normal with taking 175 mcg  He will need labs again in 2 months   PLAN:   No change in medication regimen including Trulicity Encouraged him to start walking for exercise More blood sugar testing after meals  Elayne Snare 05/16/2017, 8:21 AM   Note: This office note was prepared with Dragon voice recognition system technology. Any transcriptional errors that result from this process are unintentional.

## 2017-05-16 NOTE — Patient Instructions (Signed)
Walk or exercise daily  Synthroid  Just  1 day take 1/2 pill

## 2017-05-16 NOTE — Progress Notes (Signed)
Office: [phone]  /  Fax: [phone]   HPI:   Chief Complaint: OBESITY Colin Wood is here to discuss his progress with his obesity treatment plan. He is on the keep a food journal with 1500-1800 calories and 100+ grams of protein daily and is following his eating plan approximately 67 % of the time. He states he is exercising 0 minutes 0 times per week. Colin Wood continues to do well with weight loss. He plans his meals well and incorporates variety. He would like more meal planning ideas.  His weight is 220 lb (99.8 kg) today and has had a weight loss of 6 pounds over a period of 2 to 3 weeks since his last visit. He has lost 24 lbs since starting treatment with [place].  Diabetes II without Insulin, without complications Colin Wood has a diagnosis of diabetes type II. Colin Wood states fasting BGs range between 90's and 110's and denies any hypoglycemic episodes. Last A1c was 5.8 on 05/10/17. He has been working on intensive lifestyle modifications including diet, exercise, and weight loss to help control his blood glucose levels.  Vitamin D deficiency Colin Wood has a diagnosis of vitamin D deficiency. He is currently taking prescription Vit D and denies nausea, vomiting or muscle weakness.  At risk for osteopenia and osteoporosis Colin Wood is at higher risk of osteopenia and osteoporosis due to vitamin D deficiency.   Depression with emotional eating behaviors Colin Wood is struggling with emotional eating and using food for comfort to the extent that it is negatively impacting his health. He often snacks when he is not hungry. Colin Wood sometimes feels he is out of control and then feels guilty that he made poor food choices. He has been working on behavior modification techniques to help reduce his emotional eating and has been somewhat successful. His mood is stable and he shows no sign of suicidal or homicidal ideations.  Depression screen Colin Wood 2/9 02/10/2017 12/20/2014  Decreased Interest 2 0  Down,  Depressed, Hopeless 1 0  PHQ - 2 Score 3 0  Altered sleeping 3 -  Tired, decreased energy 3 -  Change in appetite 0 -  Feeling bad or failure about yourself  2 -  Trouble concentrating 1 -  Moving slowly or fidgety/restless 0 -  Suicidal thoughts 0 -  PHQ-9 Score 12 -  Difficult doing work/chores Not difficult at all -  Some recent data might be hidden   ALLERGIES: No Known Allergies  MEDICATIONS: Current Outpatient Medications on File Prior to Visit  Medication Sig Dispense Refill  . allopurinol (ZYLOPRIM) 300 MG tablet Take 1 tablet (300 mg total) daily by mouth. 90 tablet 3  . Ascorbic Acid (VITAMIN C PO) Take 2 tablets by mouth 2 (two) times daily.    [name] atorvastatin (LIPITOR) 10 MG tablet Take 1 tablet (10 mg total) by mouth daily. 90 tablet 0  . buPROPion (WELLBUTRIN SR) 200 MG 12 hr tablet TAKE 1 TABLET (200 MG TOTAL) BY MOUTH DAILY AT 12 NOON. 30 tablet 0  . canagliflozin (INVOKANA) 300 MG TABS tablet TAKE 1 TABLET BY MOUTH  DAILY BEFORE BREAKFAST 90 tablet 1  . docusate sodium (COLACE) 100 MG capsule Take 100 mg by mouth daily.    . Dulaglutide (TRULICITY) 1.5 WE/9.9BZ SOPN Inject weekly 12 pen 1  . famotidine (PEPCID) 20 MG tablet Take 1 tablet (20 mg total) by mouth at bedtime. 90 tablet 3  . fenofibrate 160 MG tablet Take 1 tablet (160 mg total) daily by mouth. 90 tablet 3  .  glucose blood (ONE TOUCH ULTRA TEST) test strip Use to check blood sugar 3 times per day 300 each 2  . levothyroxine (SYNTHROID) 175 MCG tablet Take 1 tablet (175 mcg total) by mouth daily before breakfast. 90 tablet 2  . levothyroxine (SYNTHROID, LEVOTHROID) 175 MCG tablet Take 1 tablet (175 mcg total) daily before breakfast by mouth. 30 tablet 0  . lisinopril (PRINIVIL,ZESTRIL) 5 MG tablet TAKE 1 TABLET BY MOUTH  DAILY 90 tablet 2  . metFORMIN (GLUCOPHAGE) 1000 MG tablet TAKE 1 TABLET BY MOUTH TWO  TIMES DAILY WITH A MEAL 180 tablet 1  . metoprolol succinate (TOPROL-XL) 25 MG 24 hr tablet Take 1  tablet (25 mg total) by mouth daily. 90 tablet 0  . Multiple Vitamin (MULTIVITAMIN) tablet Take 1 tablet by mouth daily.    [name] omega-3 acid ethyl esters (LOVAZA) 1 g capsule TAKE 2 CAPSULES BY MOUTH  TWO TIMES DAILY 360 capsule 1  . ONETOUCH DELICA LANCETS 55D MISC Use to check blood sugar 3 times per day. 300 each 2  . rivaroxaban (XARELTO) 20 MG TABS tablet Take 1 tablet (20 mg total) by mouth daily with supper. 90 tablet 1  . Vitamin D, Ergocalciferol, (DRISDOL) 50000 units CAPS capsule Take 1 capsule (50,000 Units total) by mouth every 7 (seven) days. 4 capsule 0   No current facility-administered medications on file prior to visit.     PAST MEDICAL HISTORY: Past Medical History:  Diagnosis Date  . Anxiety   . Atrial flutter ([place])    typical appearing; s/p ablation 12-2012  . Constipation   . Depression    used to see psych, on no meds as of 03/2009  . Diabetes mellitus    TYPE 2  . Fatty liver   . GERD (gastroesophageal reflux disease)   . Gout   . Hyperlipidemia   . Hypothyroidism   . Leg edema   . Obesity   . Persistent atrial fibrillation ([place])    PVI 12/2012  . Prediabetes   . Rhinitis    vasomotor  . S/P Minimally invasive maze operation for atrial fibrillation 03/12/2016   Complete bilateral atrial lesion set using cryothermy and bipolar radiofrequency ablation with clipping of LA appendage via right mini thoracotomy approach  . S/P patent foramen ovale closure 03/12/2016  . Sleep apnea    mild, now treated with CPAP since ~9-12    PAST SURGICAL HISTORY: Past Surgical History:  Procedure Laterality Date  . ABLATION OF DYSRHYTHMIC FOCUS  01/05/2013   PVI and flutter ablation by [name]  . ATRIAL FIBRILLATION ABLATION N/A 01/05/2013   Procedure: ATRIAL FIBRILLATION ABLATION;  Surgeon: Elwood Grayer, MD;  Location[facility] CATH LAB;  Service: Cardiovascular;  Laterality: N/A;  . CARDIAC CATHETERIZATION N/A 01/12/2016   Procedure: Right/Left Heart Cath and Coronary  Angiography;  Surgeon: [name], MD;  Location: [place] CV LAB;  Service: Cardiovascular;  Laterality: N/A;  . CARDIOVERSION  03/01/11  . CLIPPING OF ATRIAL APPENDAGE Left 03/12/2016   Procedure: CLIPPING OF ATRIAL APPENDAGE;  Surgeon: [name], MD;  Location: [place];  Service: Open Heart Surgery;  Laterality: Left;  . MINIMALLY INVASIVE MAZE PROCEDURE N/A 03/12/2016   Procedure: MINIMALLY INVASIVE MAZE PROCEDURE;  Surgeon: [name], MD;  Location: [place];  Service: Open Heart Surgery;  Laterality: N/A;  . NASAL SINUS SURGERY    . TEE WITHOUT CARDIOVERSION N/A 01/04/2013   Procedure: TRANSESOPHAGEAL ECHOCARDIOGRAM (TEE);  Surgeon: [name], MD;  Location: [facility]  ENDOSCOPY;  Service: Cardiovascular;  Laterality: N/A;  . TEE WITHOUT CARDIOVERSION N/A 03/12/2016   Procedure: TRANSESOPHAGEAL ECHOCARDIOGRAM (TEE);  Surgeon: [name], MD;  Location: [place];  Service: Open Heart Surgery;  Laterality: N/A;  . TONSILLECTOMY    . VASECTOMY      SOCIAL HISTORY: Social History   Tobacco Use  . Smoking status: Never Smoker  . Smokeless tobacco: Never Used  Substance Use Topics  . Alcohol use: No  . Drug use: No    FAMILY HISTORY: Family History  Problem Relation Age of Onset  . Asthma Mother   . Breast cancer Mother   . Dementia Mother   . Diabetes Father   . Hyperlipidemia Father   . Obesity Father   . Atrial fibrillation Father   . Hypertension Brother   . Melanoma Brother   . Allergies Daughter   . Throat cancer Maternal Uncle   . Colon cancer Paternal Grandfather   . Prostate cancer Neg Hx     ROS: Review of Systems  Constitutional: Positive for weight loss.  Gastrointestinal: Negative for nausea and vomiting.  Musculoskeletal:       Negative muscle weakness  Endo/Heme/Allergies:       Negative hypoglycemia  Psychiatric/Behavioral: Positive for depression. Negative for suicidal ideas.    PHYSICAL EXAM: Blood pressure 117/72, pulse 80,  temperature 98.1 F (36.7 C), temperature source Oral, height 6\' 1"  (1.854 m), weight 220 lb (99.8 kg), SpO2 97 %. Body mass index is 29.03 kg/m. Physical Exam  Constitutional: He is oriented to person, place, and time. He appears well-developed and well-nourished.  Cardiovascular: Normal rate.  Pulmonary/Chest: Effort normal.  Musculoskeletal: Normal range of motion.  Neurological: He is oriented to person, place, and time.  Skin: Skin is warm and dry.  Psychiatric: He has a normal mood and affect.  Vitals reviewed.   RECENT LABS AND TESTS: BMET    Component Value Date/Time   NA 141 05/10/2017 1041   K 5.1 05/10/2017 1041   CL 102 05/10/2017 1041   CO2 25 05/10/2017 1041   GLUCOSE 106 (H) 05/10/2017 1041   GLUCOSE 103 (H) 03/17/2016 0312   GLUCOSE 154 04/01/2009 0000   BUN 24 05/10/2017 1041   CREATININE 1.14 05/10/2017 1041   CALCIUM 10.2 05/10/2017 1041   GFRNONAA 71 05/10/2017 1041   GFRAA 82 05/10/2017 1041   Lab Results  Component Value Date   HGBA1C 5.8 (H) 05/10/2017   HGBA1C 6.6 (H) 02/10/2017   HGBA1C 6.8 11/08/2016   HGBA1C 6.1 (H) 04/27/2016   HGBA1C 7.5 03/02/2016   Lab Results  Component Value Date   INSULIN 15.6 02/10/2017   CBC    Component Value Date/Time   WBC 6.1 02/10/2017 1131   WBC 7.5 03/16/2016 0439   RBC 5.22 02/10/2017 1131   RBC 3.85 (L) 03/16/2016 0439   HGB 15.1 02/10/2017 1131   HCT 43.0 02/10/2017 1131   PLT 542 (A) 03/30/2016   PLT 192 01/05/2016 0815   MCV 82 02/10/2017 1131   MCH 28.9 02/10/2017 1131   MCH 29.1 03/16/2016 0439   MCHC 35.1 02/10/2017 1131   MCHC 35.0 03/16/2016 0439   RDW 13.5 02/10/2017 1131   LYMPHSABS 2.2 02/10/2017 1131   EOSABS 0.1 02/10/2017 1131   BASOSABS 0.0 02/10/2017 1131   Iron/TIBC/Ferritin/ %Sat No results found for: IRON, TIBC, FERRITIN, IRONPCTSAT Lipid Panel     Component Value Date/Time   CHOL 171 02/10/2017 1131   TRIG 102 02/10/2017 1131  HDL 47 02/10/2017 1131   CHOLHDL 3.9  11/10/2016 0945   LDLCALC 104 (H) 02/10/2017 1131   Hepatic Function Panel     Component Value Date/Time   PROT 7.3 02/10/2017 1131   ALBUMIN 5.4 02/10/2017 1131   AST 22 02/10/2017 1131   ALT 32 02/10/2017 1131   ALKPHOS 43 02/10/2017 1131   BILITOT 0.5 02/10/2017 1131      Component Value Date/Time   TSH 0.417 (L) 05/10/2017 1041   TSH 0.208 (L) 02/10/2017 1131   TSH 0.198 (L) 11/10/2016 0945    ASSESSMENT AND PLAN: Vitamin D deficiency - Plan: Vitamin D, Ergocalciferol, (DRISDOL) 50000 units CAPS capsule  Type 2 diabetes mellitus without complication, without long-term current use of insulin (HCC)  Other depression - with emotional eating - Plan: buPROPion (WELLBUTRIN SR) 200 MG 12 hr tablet  Class 1 obesity with serious comorbidity and body mass index (BMI) of 30.0 to 30.9 in adult, unspecified obesity type - starting BMI greater than 30  PLAN:  Diabetes II without Insulin, without complications Colin Wood has been given extensive diabetes education by myself today including ideal fasting and post-prandial blood glucose readings, individual ideal Hgb A1c goals and hypoglycemia prevention. We discussed the importance of good blood sugar control to decrease the likelihood of diabetic complications such as nephropathy, neuropathy, limb loss, blindness, coronary artery disease, and death. We discussed the importance of intensive lifestyle modification including diet, exercise and weight loss as the first line treatment for diabetes. Colin Wood agrees to continue his diabetes medications as prescribed and he will follow up with our clinic in 3 weeks.  Vitamin D Deficiency Colin Wood was informed that low vitamin D levels contributes to fatigue and are associated with obesity, breast, and colon cancer. Colin Wood agrees to continue taking prescription Vit D @50 ,000 IU every week #4 and we will refill for 1 month. He will follow up for routine testing of vitamin D, at least 2-3 times per year. He  was informed of the risk of over-replacement of vitamin D and agrees to not increase his dose unless he discusses this with [place] first. Colin Wood agrees to follow up with our clinic in 3 weeks.  At risk for osteopenia and osteoporosis Colin Wood is at risk for osteopenia and osteoporsis due to his vitamin D deficiency. He was encouraged to take his vitamin D and follow his higher calcium diet and increase strengthening exercise to help strengthen his bones and decrease his risk of osteopenia and osteoporosis.  Depression with Emotional Eating Behaviors We discussed behavior modification techniques today to help Colin Wood deal with his emotional eating and depression. Colin Wood agrees to continue taking Wellbutrin SR 200 mg qd #30 and we will refill for 1 month. Colin Wood agrees to follow up with our clinic in 3 weeks.  Obesity Colin Wood is currently in the action stage of change. As such, his goal is to continue with weight loss efforts He has agreed to keep a food journal with 1500-1800 calories and 100+ grams of protein daily Colin Wood has been instructed to work up to a goal of 150 minutes of combined cardio and strengthening exercise per week for weight loss and overall health benefits. We discussed the following Behavioral Modification Strategies today: increasing lean protein intake and work on meal planning and easy cooking plans   Colin Wood has agreed to follow up with our clinic in 3 weeks. He was informed of the importance of frequent follow up visits to maximize his success with intensive lifestyle modifications for his multiple  health conditions.  I, Colin Wood, am acting as transcriptionist for Lacy Duverney, PA-C  I have reviewed the above documentation for accuracy and completeness, and I agree with the above. -Lacy Duverney, PA-C  I have reviewed the above note and agree with the plan. -Dennard Nip, MD     Today's visit was # 6 out of 22.  Starting weight: 244 lbs Starting date:  02/10/17 Today's weight : 220 lbs  Today's date: 05/16/2017 Total lbs lost to date: 24 (Patients must lose 7 lbs in the first 6 months to continue with counseling)   ASK: We discussed the diagnosis of obesity with Loel Lofty today and Leory Plowman agreed to give [place] permission to discuss obesity behavioral modification therapy today.  ASSESS: Hiro has the diagnosis of obesity and his BMI today is 29.03 Zackary is in the action stage of change   ADVISE: Adar was educated on the multiple health risks of obesity as well as the benefit of weight loss to improve his health. He was advised of the need for long term treatment and the importance of lifestyle modifications.  AGREE: Multiple dietary modification options and treatment options were discussed and  Jager agreed to keep a food journal with 1500-1800 calories and 100+ grams of protein daily We discussed the following Behavioral Modification Strategies today: increasing lean protein intake and work on meal planning and easy cooking plans

## 2017-05-18 ENCOUNTER — Other Ambulatory Visit: Payer: Self-pay | Admitting: Endocrinology

## 2017-05-18 DIAGNOSIS — E038 Other specified hypothyroidism: Secondary | ICD-10-CM

## 2017-05-22 ENCOUNTER — Other Ambulatory Visit: Payer: Self-pay | Admitting: Endocrinology

## 2017-05-22 ENCOUNTER — Other Ambulatory Visit: Payer: Self-pay | Admitting: Internal Medicine

## 2017-06-07 ENCOUNTER — Ambulatory Visit (INDEPENDENT_AMBULATORY_CARE_PROVIDER_SITE_OTHER): Payer: 59 | Admitting: Physician Assistant

## 2017-06-07 VITALS — BP 101/69 | HR 83 | Temp 98.7°F | Ht 73.0 in | Wt 220.0 lb

## 2017-06-07 DIAGNOSIS — F3289 Other specified depressive episodes: Secondary | ICD-10-CM

## 2017-06-07 DIAGNOSIS — E669 Obesity, unspecified: Secondary | ICD-10-CM

## 2017-06-07 DIAGNOSIS — Z9189 Other specified personal risk factors, not elsewhere classified: Secondary | ICD-10-CM

## 2017-06-07 DIAGNOSIS — Z6830 Body mass index (BMI) 30.0-30.9, adult: Secondary | ICD-10-CM | POA: Diagnosis not present

## 2017-06-07 DIAGNOSIS — E559 Vitamin D deficiency, unspecified: Secondary | ICD-10-CM | POA: Diagnosis not present

## 2017-06-07 MED ORDER — BUPROPION HCL ER (SR) 200 MG PO TB12: 200.0000 mg | ORAL_TABLET | Freq: Every day | ORAL | 0 refills | Status: DC

## 2017-06-07 MED ORDER — VITAMIN D (ERGOCALCIFEROL) 1.25 MG (50000 UNIT) PO CAPS: 50000.0000 [IU] | ORAL_CAPSULE | ORAL | 0 refills | Status: DC

## 2017-06-07 NOTE — Progress Notes (Addendum)
Office: [phone]  /  Fax: [phone]   HPI:   Chief Complaint: OBESITY Colin Wood is here to discuss his progress with his obesity treatment plan. He is on the keep a food journal with 1500-1800 calories and 100+ grams of protein daily and is following his eating plan approximately 25 % of the time. He states he is exercising 0 minutes 0 times per week. Colin Wood maintained his weight over the holidays. He is mindful of his eating and states his hunger is well controlled.  His weight is 220 lb (99.8 kg) today and has not lost weight since his last visit. He has lost 24 lbs since starting treatment with [place].  Vitamin D deficiency Colin Wood has a diagnosis of vitamin D deficiency. He is currently taking prescription Vit D and denies nausea, vomiting or muscle weakness.  At risk for osteopenia and osteoporosis Colin Wood is at higher risk of osteopenia and osteoporosis due to vitamin D deficiency.   Depression with emotional eating behaviors Colin Wood is struggling with emotional eating and using food for comfort to the extent that it is negatively impacting his health. He often snacks when he is not hungry. Colin Wood sometimes feels he is out of control and then feels guilty that he made poor food choices. He has been working on behavior modification techniques to help reduce his emotional eating and has been somewhat successful. His mood is stable and he shows no sign of suicidal or homicidal ideations.  Depression screen [facility] 2/9 02/10/2017 12/20/2014  Decreased Interest 2 0  Down, Depressed, Hopeless 1 0  PHQ - 2 Score 3 0  Altered sleeping 3 -  Tired, decreased energy 3 -  Change in appetite 0 -  Feeling bad or failure about yourself  2 -  Trouble concentrating 1 -  Moving slowly or fidgety/restless 0 -  Suicidal thoughts 0 -  PHQ-9 Score 12 -  Difficult doing work/chores Not difficult at all -  Some recent data might be hidden   ALLERGIES: No Known Allergies  MEDICATIONS: Current  Outpatient Medications on File Prior to Visit  Medication Sig Dispense Refill  . allopurinol (ZYLOPRIM) 300 MG tablet Take 1 tablet (300 mg total) daily by mouth. 90 tablet 3  . Ascorbic Acid (VITAMIN C PO) Take 2 tablets by mouth 2 (two) times daily.    [name] atorvastatin (LIPITOR) 10 MG tablet TAKE 1 TABLET BY MOUTH  DAILY 90 tablet 0  . canagliflozin (INVOKANA) 300 MG TABS tablet TAKE 1 TABLET BY MOUTH  DAILY BEFORE BREAKFAST 90 tablet 1  . docusate sodium (COLACE) 100 MG capsule Take 100 mg by mouth daily.    . Dulaglutide (TRULICITY) 1.5 PN/3.6RW SOPN Inject weekly 12 pen 1  . famotidine (PEPCID) 20 MG tablet Take 1 tablet (20 mg total) by mouth at bedtime. 90 tablet 3  . fenofibrate 160 MG tablet Take 1 tablet (160 mg total) daily by mouth. 90 tablet 3  . glucose blood (ONE TOUCH ULTRA TEST) test strip Use to check blood sugar 3 times per day 300 each 2  . levothyroxine (SYNTHROID) 175 MCG tablet Take 1 tablet (175 mcg total) by mouth daily before breakfast. 90 tablet 2  . levothyroxine (SYNTHROID, LEVOTHROID) 175 MCG tablet Take 1 tablet (175 mcg total) daily before breakfast by mouth. 30 tablet 0  . levothyroxine (SYNTHROID, LEVOTHROID) 175 MCG tablet TAKE 1 TABLET BY MOUTH  DAILY BEFORE BREAKFAST 30 tablet 0  . lisinopril (PRINIVIL,ZESTRIL) 5 MG tablet Take 1 tablet (5 mg total)  by mouth daily. 90 tablet 3  . metFORMIN (GLUCOPHAGE) 1000 MG tablet TAKE 1 TABLET BY MOUTH TWO  TIMES DAILY WITH A MEAL 180 tablet 1  . metoprolol succinate (TOPROL-XL) 25 MG 24 hr tablet Take 1 tablet (25 mg total) by mouth daily. 90 tablet 0  . Multiple Vitamin (MULTIVITAMIN) tablet Take 1 tablet by mouth daily.    [name] omega-3 acid ethyl esters (LOVAZA) 1 g capsule TAKE 2 CAPSULES BY MOUTH  TWO TIMES DAILY 360 capsule 1  . ONETOUCH DELICA LANCETS 31D MISC Use to check blood sugar 3 times per day. 300 each 2  . rivaroxaban (XARELTO) 20 MG TABS tablet Take 1 tablet (20 mg total) by mouth daily with supper. 90 tablet 1    No current facility-administered medications on file prior to visit.     PAST MEDICAL HISTORY: Past Medical History:  Diagnosis Date  . Anxiety   . Atrial flutter ([place])    typical appearing; s/p ablation 12-2012  . Constipation   . Depression    used to see psych, on no meds as of 03/2009  . Diabetes mellitus    TYPE 2  . Fatty liver   . GERD (gastroesophageal reflux disease)   . Gout   . Hyperlipidemia   . Hypothyroidism   . Leg edema   . Obesity   . Persistent atrial fibrillation ([place])    PVI 12/2012  . Prediabetes   . Rhinitis    vasomotor  . S/P Minimally invasive maze operation for atrial fibrillation 03/12/2016   Complete bilateral atrial lesion set using cryothermy and bipolar radiofrequency ablation with clipping of LA appendage via right mini thoracotomy approach  . S/P patent foramen ovale closure 03/12/2016  . Sleep apnea    mild, now treated with CPAP since ~9-12    PAST SURGICAL HISTORY: Past Surgical History:  Procedure Laterality Date  . ABLATION OF DYSRHYTHMIC FOCUS  01/05/2013   PVI and flutter ablation by [name]  . ATRIAL FIBRILLATION ABLATION N/A 01/05/2013   Procedure: ATRIAL FIBRILLATION ABLATION;  Surgeon: Kataoka Grayer, MD;  Location: [facility] CATH LAB;  Service: Cardiovascular;  Laterality: N/A;  . CARDIAC CATHETERIZATION N/A 01/12/2016   Procedure: Right/Left Heart Cath and Coronary Angiography;  Surgeon: [name], MD;  Location: [place] CV LAB;  Service: Cardiovascular;  Laterality: N/A;  . CARDIOVERSION  03/01/11  . CLIPPING OF ATRIAL APPENDAGE Left 03/12/2016   Procedure: CLIPPING OF ATRIAL APPENDAGE;  Surgeon: [name], MD;  Location: [place];  Service: Open Heart Surgery;  Laterality: Left;  . MINIMALLY INVASIVE MAZE PROCEDURE N/A 03/12/2016   Procedure: MINIMALLY INVASIVE MAZE PROCEDURE;  Surgeon: [name], MD;  Location: [place];  Service: Open Heart Surgery;  Laterality: N/A;  . NASAL SINUS SURGERY    . TEE WITHOUT  CARDIOVERSION N/A 01/04/2013   Procedure: TRANSESOPHAGEAL ECHOCARDIOGRAM (TEE);  Surgeon: [name], MD;  Location: [facility]OSCOPY;  Service: Cardiovascular;  Laterality: N/A;  . TEE WITHOUT CARDIOVERSION N/A 03/12/2016   Procedure: TRANSESOPHAGEAL ECHOCARDIOGRAM (TEE);  Surgeon: [name], MD;  Location: [place];  Service: Open Heart Surgery;  Laterality: N/A;  . TONSILLECTOMY    . VASECTOMY      SOCIAL HISTORY: Social History   Tobacco Use  . Smoking status: Never Smoker  . Smokeless tobacco: Never Used  Substance Use Topics  . Alcohol use: No  . Drug use: No    FAMILY HISTORY: Family History  Problem Relation Age of Onset  .  Asthma Mother   . Breast cancer Mother   . Dementia Mother   . Diabetes Father   . Hyperlipidemia Father   . Obesity Father   . Atrial fibrillation Father   . Hypertension Brother   . Melanoma Brother   . Allergies Daughter   . Throat cancer Maternal Uncle   . Colon cancer Paternal Grandfather   . Prostate cancer Neg Hx     ROS: Review of Systems  Constitutional: Negative for weight loss.  Gastrointestinal: Negative for nausea and vomiting.  Musculoskeletal:       Negative muscle weakness  Psychiatric/Behavioral: Positive for depression. Negative for suicidal ideas.    PHYSICAL EXAM: Blood pressure 101/69, pulse 83, temperature 98.7 F (37.1 C), temperature source Oral, height 6\' 1"  (1.854 m), weight 220 lb (99.8 kg), SpO2 96 %. Body mass index is 29.03 kg/m. Physical Exam  Constitutional: He is oriented to person, place, and time. He appears well-developed and well-nourished.  Cardiovascular: Normal rate.  Pulmonary/Chest: Effort normal.  Musculoskeletal: Normal range of motion.  Neurological: He is oriented to person, place, and time.  Skin: Skin is warm and dry.  Psychiatric: He has a normal mood and affect. His behavior is normal.  Vitals reviewed.   RECENT LABS AND TESTS: BMET    Component Value Date/Time   NA 141  05/10/2017 1041   K 5.1 05/10/2017 1041   CL 102 05/10/2017 1041   CO2 25 05/10/2017 1041   GLUCOSE 106 (H) 05/10/2017 1041   GLUCOSE 103 (H) 03/17/2016 0312   GLUCOSE 154 04/01/2009 0000   BUN 24 05/10/2017 1041   CREATININE 1.14 05/10/2017 1041   CALCIUM 10.2 05/10/2017 1041   GFRNONAA 71 05/10/2017 1041   GFRAA 82 05/10/2017 1041   Lab Results  Component Value Date   HGBA1C 5.8 (H) 05/10/2017   HGBA1C 6.6 (H) 02/10/2017   HGBA1C 6.8 11/08/2016   HGBA1C 6.1 (H) 04/27/2016   HGBA1C 7.5 03/02/2016   Lab Results  Component Value Date   INSULIN 15.6 02/10/2017   CBC    Component Value Date/Time   WBC 6.1 02/10/2017 1131   WBC 7.5 03/16/2016 0439   RBC 5.22 02/10/2017 1131   RBC 3.85 (L) 03/16/2016 0439   HGB 15.1 02/10/2017 1131   HCT 43.0 02/10/2017 1131   PLT 542 (A) 03/30/2016   PLT 192 01/05/2016 0815   MCV 82 02/10/2017 1131   MCH 28.9 02/10/2017 1131   MCH 29.1 03/16/2016 0439   MCHC 35.1 02/10/2017 1131   MCHC 35.0 03/16/2016 0439   RDW 13.5 02/10/2017 1131   LYMPHSABS 2.2 02/10/2017 1131   EOSABS 0.1 02/10/2017 1131   BASOSABS 0.0 02/10/2017 1131   Iron/TIBC/Ferritin/ %Sat No results found for: IRON, TIBC, FERRITIN, IRONPCTSAT Lipid Panel     Component Value Date/Time   CHOL 171 02/10/2017 1131   TRIG 102 02/10/2017 1131   HDL 47 02/10/2017 1131   CHOLHDL 3.9 11/10/2016 0945   LDLCALC 104 (H) 02/10/2017 1131   Hepatic Function Panel     Component Value Date/Time   PROT 7.3 02/10/2017 1131   ALBUMIN 5.4 02/10/2017 1131   AST 22 02/10/2017 1131   ALT 32 02/10/2017 1131   ALKPHOS 43 02/10/2017 1131   BILITOT 0.5 02/10/2017 1131      Component Value Date/Time   TSH 0.417 (L) 05/10/2017 1041   TSH 0.208 (L) 02/10/2017 1131   TSH 0.198 (L) 11/10/2016 0945  Results for JAXTIN, RAIMONDO (MRN [id number]) as of  06/07/2017 17:19  Ref. Range 02/10/2017 11:31  Vitamin D, 25-Hydroxy Latest Ref Range: 30.0 - 100.0 ng/mL 29.8 (L)    ASSESSMENT AND  PLAN: Vitamin D deficiency - Plan: Vitamin D, Ergocalciferol, (DRISDOL) 50000 units CAPS capsule  Other depression - with emotional eating - Plan: buPROPion (WELLBUTRIN SR) 200 MG 12 hr tablet  At risk for osteoporosis  Class 1 obesity with serious comorbidity and body mass index (BMI) of 30.0 to 30.9 in adult, unspecified obesity type - Starting BMI greater then 30  PLAN:  Vitamin D Deficiency Adeeb was informed that low vitamin D levels contributes to fatigue and are associated with obesity, breast, and colon cancer. Harjit agrees to continue taking prescription Vit D @50 ,000 IU every week #4 and we will refill for 1 month. He will follow up for routine testing of vitamin D, at least 2-3 times per year. He was informed of the risk of over-replacement of vitamin D and agrees to not increase his dose unless he discusses this with [place] first. Dion agrees to follow up with our clinic in 3 weeks.  At risk for osteopenia and osteoporosis Hamed is at risk for osteopenia and osteoporsis due to his vitamin D deficiency. He was encouraged to take his vitamin D and follow his higher calcium diet and increase strengthening exercise to help strengthen his bones and decrease his risk of osteopenia and osteoporosis.  Depression with Emotional Eating Behaviors We discussed behavior modification techniques today to help Amaziah deal with his emotional eating and depression. Lamonte agrees to continue taking Wellbutrin SR 200 mg qd #30 and we will refill for 1 month. Valin agrees to follow up with our clinic in 3 weeks.  Obesity Katie is currently in the action stage of change. As such, his goal is to continue with weight loss efforts He has agreed to keep a food journal with 1500-1800 calories and 100+ grams of protein daily Philipe has been instructed to work up to a goal of 150 minutes of combined cardio and strengthening exercise per week for weight loss and overall health benefits. We  discussed the following Behavioral Modification Strategies today: increasing lean protein intake and planning for success    Keane has agreed to follow up with our clinic in 3 weeks. He was informed of the importance of frequent follow up visits to maximize his success with intensive lifestyle modifications for his multiple health conditions.   OBESITY BEHAVIORAL INTERVENTION VISIT  Today's visit was # 7 out of 22.  Starting weight: 244 lbs Starting date: 02/10/17 Today's weight : 220 lbs  Today's date: 06/07/2017 Total lbs lost to date: 24 (Patients must lose 7 lbs in the first 6 months to continue with counseling)   ASK: We discussed the diagnosis of obesity with Loel Lofty today and Leory Plowman agreed to give [place] permission to discuss obesity behavioral modification therapy today.  ASSESS: Sherill has the diagnosis of obesity and his BMI today is 29.03 Codey is in the action stage of change   ADVISE: Brinton was educated on the multiple health risks of obesity as well as the benefit of weight loss to improve his health. He was advised of the need for long term treatment and the importance of lifestyle modifications.  AGREE: Multiple dietary modification options and treatment options were discussed and  Jordynn agreed to the above obesity treatment plan.  Wilhemena Durie, am acting as transcriptionist for Lacy Duverney, [place], [facility] have reviewed this note and agree  with above  I have reviewed the above documentation for accuracy and completeness, and I agree with the above. -Dennard Nip, MD

## 2017-06-15 DIAGNOSIS — E119 Type 2 diabetes mellitus without complications: Secondary | ICD-10-CM | POA: Diagnosis not present

## 2017-06-15 LAB — HM DIABETES EYE EXAM

## 2017-06-28 ENCOUNTER — Ambulatory Visit (INDEPENDENT_AMBULATORY_CARE_PROVIDER_SITE_OTHER): Payer: 59 | Admitting: Physician Assistant

## 2017-06-28 ENCOUNTER — Encounter: Payer: Self-pay | Admitting: Internal Medicine

## 2017-06-28 ENCOUNTER — Other Ambulatory Visit (INDEPENDENT_AMBULATORY_CARE_PROVIDER_SITE_OTHER): Payer: Self-pay | Admitting: Physician Assistant

## 2017-06-28 VITALS — BP 105/71 | HR 77 | Temp 98.2°F | Ht 73.0 in | Wt 220.0 lb

## 2017-06-28 DIAGNOSIS — Z9189 Other specified personal risk factors, not elsewhere classified: Secondary | ICD-10-CM

## 2017-06-28 DIAGNOSIS — E669 Obesity, unspecified: Secondary | ICD-10-CM

## 2017-06-28 DIAGNOSIS — E559 Vitamin D deficiency, unspecified: Secondary | ICD-10-CM | POA: Diagnosis not present

## 2017-06-28 DIAGNOSIS — F3289 Other specified depressive episodes: Secondary | ICD-10-CM

## 2017-06-28 DIAGNOSIS — Z6830 Body mass index (BMI) 30.0-30.9, adult: Secondary | ICD-10-CM

## 2017-06-28 MED ORDER — BUPROPION HCL ER (SR) 200 MG PO TB12: 200.0000 mg | ORAL_TABLET | Freq: Every day | ORAL | 0 refills | Status: DC

## 2017-06-28 MED ORDER — VITAMIN D (ERGOCALCIFEROL) 1.25 MG (50000 UNIT) PO CAPS: 50000.0000 [IU] | ORAL_CAPSULE | ORAL | 0 refills | Status: DC

## 2017-06-28 NOTE — Progress Notes (Signed)
Office: [phone]  /  Fax: [phone]   HPI:   Chief Complaint: OBESITY Colin Wood is here to discuss his progress with his obesity treatment plan. He is on the  keep a food journal with 1500-1800 calories and 100+ protein  and is following his eating plan approximately 25 % of the time. He states he is not exercising. Colin Wood maintained his weight. He has been busier with work, and has not planned ahead his meals as well. He would like better snack ideas, and emotional eating strategies.  His weight is 220 lb (99.8 kg) today and he has maintained the same weight since his last visit. He has lost 24 lbs since starting treatment with [place].  Vitamin D deficiency Colin Wood has a diagnosis of vitamin D deficiency. He is currently taking vit D and denies nausea, vomiting or muscle weakness.   Ref. Range 02/10/2017 11:31  Vitamin D, 25-Hydroxy Latest Ref Range: 30.0 - 100.0 ng/mL 29.8 (L)    Depression with emotional eating behaviors Colin Wood's mood is stable, but is struggling with emotional eating and using food for comfort to the extent that it is negatively impacting his health. He often snacks when he is not hungry. Colin Wood sometimes feels he is out of control and then feels guilty that he made poor food choices. He has been working on behavior modification techniques to help reduce his emotional eating and has been somewhat successful. He shows no sign of suicidal or homicidal ideations.  At risk for osteopenia and osteoporosis Colin Wood is at higher risk of osteopenia and osteoporosis due to vitamin D deficiency.   Depression screen Colin Wood 2/9 02/10/2017 12/20/2014  Decreased Interest 2 0  Down, Depressed, Hopeless 1 0  PHQ - 2 Score 3 0  Altered sleeping 3 -  Tired, decreased energy 3 -  Change in appetite 0 -  Feeling bad or failure about yourself  2 -  Trouble concentrating 1 -  Moving slowly or fidgety/restless 0 -  Suicidal thoughts 0 -  PHQ-9 Score 12 -  Difficult doing work/chores  Not difficult at all -  Some recent data might be hidden      ALLERGIES: No Known Allergies  MEDICATIONS: Current Outpatient Medications on File Prior to Visit  Medication Sig Dispense Refill  . allopurinol (ZYLOPRIM) 300 MG tablet Take 1 tablet (300 mg total) daily by mouth. 90 tablet 3  . Ascorbic Acid (VITAMIN C PO) Take 2 tablets by mouth 2 (two) times daily.    [name] atorvastatin (LIPITOR) 10 MG tablet TAKE 1 TABLET BY MOUTH  DAILY 90 tablet 0  . buPROPion (WELLBUTRIN SR) 200 MG 12 hr tablet Take 1 tablet (200 mg total) by mouth daily at 12 noon. 30 tablet 0  . canagliflozin (INVOKANA) 300 MG TABS tablet TAKE 1 TABLET BY MOUTH  DAILY BEFORE BREAKFAST 90 tablet 1  . docusate sodium (COLACE) 100 MG capsule Take 100 mg by mouth daily.    . Dulaglutide (TRULICITY) 1.5 VQ/2.5ZD SOPN Inject weekly 12 pen 1  . famotidine (PEPCID) 20 MG tablet Take 1 tablet (20 mg total) by mouth at bedtime. 90 tablet 3  . fenofibrate 160 MG tablet Take 1 tablet (160 mg total) daily by mouth. 90 tablet 3  . glucose blood (ONE TOUCH ULTRA TEST) test strip Use to check blood sugar 3 times per day 300 each 2  . levothyroxine (SYNTHROID) 175 MCG tablet Take 1 tablet (175 mcg total) by mouth daily before breakfast. 90 tablet 2  . lisinopril (  PRINIVIL,ZESTRIL) 5 MG tablet Take 1 tablet (5 mg total) by mouth daily. 90 tablet 3  . metFORMIN (GLUCOPHAGE) 1000 MG tablet TAKE 1 TABLET BY MOUTH TWO  TIMES DAILY WITH A MEAL 180 tablet 1  . metoprolol succinate (TOPROL-XL) 25 MG 24 hr tablet Take 1 tablet (25 mg total) by mouth daily. 90 tablet 0  . Multiple Vitamin (MULTIVITAMIN) tablet Take 1 tablet by mouth daily.    [name] omega-3 acid ethyl esters (LOVAZA) 1 g capsule TAKE 2 CAPSULES BY MOUTH  TWO TIMES DAILY 360 capsule 1  . ONETOUCH DELICA LANCETS 10C MISC Use to check blood sugar 3 times per day. 300 each 2  . rivaroxaban (XARELTO) 20 MG TABS tablet Take 1 tablet (20 mg total) by mouth daily with supper. 90 tablet 1  .  Vitamin D, Ergocalciferol, (DRISDOL) 50000 units CAPS capsule Take 1 capsule (50,000 Units total) by mouth every 7 (seven) days. 4 capsule 0   No current facility-administered medications on file prior to visit.     PAST MEDICAL HISTORY: Past Medical History:  Diagnosis Date  . Anxiety   . Atrial flutter ([place])    typical appearing; s/p ablation 12-2012  . Constipation   . Depression    used to see psych, on no meds as of 03/2009  . Diabetes mellitus    TYPE 2  . Fatty liver   . GERD (gastroesophageal reflux disease)   . Gout   . Hyperlipidemia   . Hypothyroidism   . Leg edema   . Obesity   . Persistent atrial fibrillation ([place])    PVI 12/2012  . Prediabetes   . Rhinitis    vasomotor  . S/P Minimally invasive maze operation for atrial fibrillation 03/12/2016   Complete bilateral atrial lesion set using cryothermy and bipolar radiofrequency ablation with clipping of LA appendage via right mini thoracotomy approach  . S/P patent foramen ovale closure 03/12/2016  . Sleep apnea    mild, now treated with CPAP since ~9-12    PAST SURGICAL HISTORY: Past Surgical History:  Procedure Laterality Date  . ABLATION OF DYSRHYTHMIC FOCUS  01/05/2013   PVI and flutter ablation by [name]  . ATRIAL FIBRILLATION ABLATION N/A 01/05/2013   Procedure: ATRIAL FIBRILLATION ABLATION;  Surgeon: Krell Grayer, MD;  Location[facility] CATH LAB;  Service: Cardiovascular;  Laterality: N/A;  . CARDIAC CATHETERIZATION N/A 01/12/2016   Procedure: Right/Left Heart Cath and Coronary Angiography;  Surgeon: [name], MD;  Location: [place] CV LAB;  Service: Cardiovascular;  Laterality: N/A;  . CARDIOVERSION  03/01/11  . CLIPPING OF ATRIAL APPENDAGE Left 03/12/2016   Procedure: CLIPPING OF ATRIAL APPENDAGE;  Surgeon: [name], MD;  Location: [place];  Service: Open Heart Surgery;  Laterality: Left;  . MINIMALLY INVASIVE MAZE PROCEDURE N/A 03/12/2016   Procedure: MINIMALLY INVASIVE MAZE PROCEDURE;   Surgeon: [name], MD;  Location: [facility];  Service: Open Heart Surgery;  Laterality: N/A;  . NASAL SINUS SURGERY    . TEE WITHOUT CARDIOVERSION N/A 01/04/2013   Procedure: TRANSESOPHAGEAL ECHOCARDIOGRAM (TEE);  Surgeon: [name], MD;  Location: [facility] ENDOSCOPY;  Service: Cardiovascular;  Laterality: N/A;  . TEE WITHOUT CARDIOVERSION N/A 03/12/2016   Procedure: TRANSESOPHAGEAL ECHOCARDIOGRAM (TEE);  Surgeon: [name], MD;  Location: [facility];  Service: Open Heart Surgery;  Laterality: N/A;  . TONSILLECTOMY    . VASECTOMY      SOCIAL HISTORY: Social History   Tobacco Use  . Smoking status: Never  Smoker  . Smokeless tobacco: Never Used  Substance Use Topics  . Alcohol use: No  . Drug use: No    FAMILY HISTORY: Family History  Problem Relation Age of Onset  . Asthma Mother   . Breast cancer Mother   . Dementia Mother   . Diabetes Father   . Hyperlipidemia Father   . Obesity Father   . Atrial fibrillation Father   . Hypertension Brother   . Melanoma Brother   . Allergies Daughter   . Throat cancer Maternal Uncle   . Colon cancer Paternal Grandfather   . Prostate cancer Neg Hx     ROS: Review of Systems  Constitutional: Negative for weight loss.  Gastrointestinal: Negative for nausea and vomiting.  Musculoskeletal:       Negative muscle weakness  Psychiatric/Behavioral: Positive for depression. Negative for suicidal ideas.    PHYSICAL EXAM: Blood pressure 105/71, pulse 77, temperature 98.2 F (36.8 C), temperature source Oral, height 6\' 1"  (1.854 m), weight 220 lb (99.8 kg), SpO2 98 %. Body mass index is 29.03 kg/m. Physical Exam  Constitutional: He is oriented to person, place, and time. He appears well-developed and well-nourished.  HENT:  Head: Normocephalic.  Eyes: EOM are normal.  Neck: Normal range of motion.  Cardiovascular: Normal rate.  Pulmonary/Chest: Effort normal.  Musculoskeletal: Normal range of motion.  Neurological: He is alert and  oriented to person, place, and time.  Skin: Skin is warm and dry.  Psychiatric: He has a normal mood and affect. His behavior is normal.  Vitals reviewed.   RECENT LABS AND TESTS: BMET    Component Value Date/Time   NA 141 05/10/2017 1041   K 5.1 05/10/2017 1041   CL 102 05/10/2017 1041   CO2 25 05/10/2017 1041   GLUCOSE 106 (H) 05/10/2017 1041   GLUCOSE 103 (H) 03/17/2016 0312   GLUCOSE 154 04/01/2009 0000   BUN 24 05/10/2017 1041   CREATININE 1.14 05/10/2017 1041   CALCIUM 10.2 05/10/2017 1041   GFRNONAA 71 05/10/2017 1041   GFRAA 82 05/10/2017 1041   Lab Results  Component Value Date   HGBA1C 5.8 (H) 05/10/2017   HGBA1C 6.6 (H) 02/10/2017   HGBA1C 6.8 11/08/2016   HGBA1C 6.1 (H) 04/27/2016   HGBA1C 7.5 03/02/2016   Lab Results  Component Value Date   INSULIN 15.6 02/10/2017   CBC    Component Value Date/Time   WBC 6.1 02/10/2017 1131   WBC 7.5 03/16/2016 0439   RBC 5.22 02/10/2017 1131   RBC 3.85 (L) 03/16/2016 0439   HGB 15.1 02/10/2017 1131   HCT 43.0 02/10/2017 1131   PLT 542 (A) 03/30/2016   PLT 192 01/05/2016 0815   MCV 82 02/10/2017 1131   MCH 28.9 02/10/2017 1131   MCH 29.1 03/16/2016 0439   MCHC 35.1 02/10/2017 1131   MCHC 35.0 03/16/2016 0439   RDW 13.5 02/10/2017 1131   LYMPHSABS 2.2 02/10/2017 1131   EOSABS 0.1 02/10/2017 1131   BASOSABS 0.0 02/10/2017 1131   Iron/TIBC/Ferritin/ %Sat No results found for: IRON, TIBC, FERRITIN, IRONPCTSAT Lipid Panel     Component Value Date/Time   CHOL 171 02/10/2017 1131   TRIG 102 02/10/2017 1131   HDL 47 02/10/2017 1131   CHOLHDL 3.9 11/10/2016 0945   LDLCALC 104 (H) 02/10/2017 1131   Hepatic Function Panel     Component Value Date/Time   PROT 7.3 02/10/2017 1131   ALBUMIN 5.4 02/10/2017 1131   AST 22 02/10/2017 1131   ALT  32 02/10/2017 1131   ALKPHOS 43 02/10/2017 1131   BILITOT 0.5 02/10/2017 1131      Component Value Date/Time   TSH 0.417 (L) 05/10/2017 1041   TSH 0.208 (L) 02/10/2017  1131   TSH 0.198 (L) 11/10/2016 0945     Ref. Range 02/10/2017 11:31  Vitamin D, 25-Hydroxy Latest Ref Range: 30.0 - 100.0 ng/mL 29.8 (L)   ASSESSMENT AND PLAN: Vitamin D deficiency - Plan: Vitamin D, Ergocalciferol, (DRISDOL) 50000 units CAPS capsule  Other depression - with emotional eating - Plan: buPROPion (WELLBUTRIN SR) 200 MG 12 hr tablet  At risk for osteoporosis  Class 1 obesity with serious comorbidity and body mass index (BMI) of 30.0 to 30.9 in adult, unspecified obesity type - starting BMI greater then 30  PLAN:  Vitamin D Deficiency Colin Wood was informed that low vitamin D levels contributes to fatigue and are associated with obesity, breast, and colon cancer. He agrees to continue to take prescription Vit D @50 ,000 IU #4 with no refills every week and will follow up for routine testing of vitamin D, at least 2-3 times per year. He was informed of the risk of over-replacement of vitamin D and agrees to not increase his dose unless he discusses this with [place] first. Colin Wood agrees to follow up with our Wood in 3 weeks.   Depression with Emotional Eating Behaviors We discussed behavior modification techniques today to help Colin Wood deal with his emotional eating and depression. He has agreed to continue Wellbutrin SR 200 mg qd #30 with no refills and agreed to follow up as directed.  At risk for osteopenia and osteoporosis Colin Wood is at risk for osteopenia and osteoporosis due to his vitamin D deficiency. He was encouraged to take his vitamin D and follow his higher calcium diet and increase strengthening exercise to help strengthen his bones and decrease his risk of osteopenia and osteoporosis.   Obesity Colin Wood is currently in the action stage of change. As such, his goal is to continue with weight loss efforts He has agreed to keep a food journal with 1500-1800 calories and 100 grams of  protein  Colin Wood has been instructed to work up to a goal of 150 minutes of combined  cardio and strengthening exercise per week for weight loss and overall health benefits. We discussed the following Behavioral Modification Strategies today: better snacking choices, work on meal planning and easy cooking plans and emotional eating strategies   Colin Wood has agreed to follow up with our Wood in 3 weeks. He was informed of the importance of frequent follow up visits to maximize his success with intensive lifestyle modifications for his multiple health conditions.    Today's visit was # 8 out of 22.  Starting weight: 244lb  Starting date: 02/10/17 Today's weight : 220lb  Today's date: 06/28/2017 Total lbs lost to date: 24 (Patients must lose 7 lbs in the first 6 months to continue with counseling)   ASK: We discussed the diagnosis of obesity with Colin Wood today and Colin Wood agreed to give [place] permission to discuss obesity behavioral modification therapy today.  ASSESS: Colin Wood has the diagnosis of obesity and his BMI today is 29.03 Colin Wood is in the action stage of change   ADVISE: Colin Wood was educated on the multiple health risks of obesity as well as the benefit of weight loss to improve his health. He was advised of the need for long term treatment and the importance of lifestyle modifications.  AGREE: Multiple dietary modification options and  treatment options were discussed and  Colin Wood agreed to the above obesity treatment plan.   Colin Wood, am acting as transcriptionist for [employer], PA-C I, Lacy Duverney [facility], have reviewed this note and agree with its content.

## 2017-07-03 ENCOUNTER — Other Ambulatory Visit (INDEPENDENT_AMBULATORY_CARE_PROVIDER_SITE_OTHER): Payer: Self-pay | Admitting: Physician Assistant

## 2017-07-03 DIAGNOSIS — F3289 Other specified depressive episodes: Secondary | ICD-10-CM

## 2017-07-18 ENCOUNTER — Other Ambulatory Visit: Payer: Self-pay | Admitting: Endocrinology

## 2017-07-20 ENCOUNTER — Ambulatory Visit (INDEPENDENT_AMBULATORY_CARE_PROVIDER_SITE_OTHER): Payer: 59 | Admitting: Physician Assistant

## 2017-07-20 ENCOUNTER — Encounter (INDEPENDENT_AMBULATORY_CARE_PROVIDER_SITE_OTHER): Payer: Self-pay | Admitting: Physician Assistant

## 2017-07-20 VITALS — BP 109/73 | HR 78 | Temp 98.0°F | Ht 73.0 in | Wt 220.0 lb

## 2017-07-20 DIAGNOSIS — Z9189 Other specified personal risk factors, not elsewhere classified: Secondary | ICD-10-CM

## 2017-07-20 DIAGNOSIS — E669 Obesity, unspecified: Secondary | ICD-10-CM | POA: Diagnosis not present

## 2017-07-20 DIAGNOSIS — Z6830 Body mass index (BMI) 30.0-30.9, adult: Secondary | ICD-10-CM

## 2017-07-20 DIAGNOSIS — F3289 Other specified depressive episodes: Secondary | ICD-10-CM | POA: Diagnosis not present

## 2017-07-20 DIAGNOSIS — E559 Vitamin D deficiency, unspecified: Secondary | ICD-10-CM

## 2017-07-20 MED ORDER — VITAMIN D (ERGOCALCIFEROL) 1.25 MG (50000 UNIT) PO CAPS: 50000.0000 [IU] | ORAL_CAPSULE | ORAL | 0 refills | Status: DC

## 2017-07-20 MED ORDER — BUPROPION HCL ER (SR) 200 MG PO TB12: 200.0000 mg | ORAL_TABLET | Freq: Every day | ORAL | 0 refills | Status: DC

## 2017-07-21 NOTE — Progress Notes (Signed)
Office: [phone]  /  Fax: [phone]   HPI:   Chief Complaint: OBESITY Colin Wood is here to discuss his progress with his obesity treatment plan. He is on the  keep a food journal with 1500 to 1800 calories and 100 grams of protein daily and is following his eating plan approximately 25 % of the time. He states he is ice skating and doing yard work for 45 minutes 2 times per week. Anchor maintained his weight. He journals his melas, but he often goes over his calorie limits. [name] would like healthier recipe ideas. His weight is 220 lb (99.8 kg) today and has maintained weight over a period of 3 weeks since his last visit. He has lost 24 lbs since starting treatment with [place].  Vitamin D deficiency Colin Wood has a diagnosis of vitamin D deficiency. He is currently taking vit D and denies nausea, vomiting or muscle weakness.   Ref. Range 02/10/2017 11:31  Vitamin D, 25-Hydroxy Latest Ref Range: 30.0 - 100.0 ng/mL 29.8 (L)   Depression with emotional eating behaviors [name] is struggling with emotional eating and using food for comfort to the extent that it is negatively impacting his health. He often snacks when he is not hungry. [name] sometimes feels he is out of control and then feels guilty that he made poor food choices. He has been working on behavior modification techniques to help reduce his emotional eating and has been somewhat successful. His mood is stable and he shows no sign of suicidal or homicidal ideations.  Depression screen [facility] 2/9 02/10/2017 12/20/2014  Decreased Interest 2 0  Down, Depressed, Hopeless 1 0  PHQ - 2 Score 3 0  Altered sleeping 3 -  Tired, decreased energy 3 -  Change in appetite 0 -  Feeling bad or failure about yourself  2 -  Trouble concentrating 1 -  Moving slowly or fidgety/restless 0 -  Suicidal thoughts 0 -  PHQ-9 Score 12 -  Difficult doing work/chores Not difficult at all -  Some recent data might be hidden     ALLERGIES: No Known  Allergies  MEDICATIONS: Current Outpatient Medications on File Prior to Visit  Medication Sig Dispense Refill  . allopurinol (ZYLOPRIM) 300 MG tablet Take 1 tablet (300 mg total) daily by mouth. 90 tablet 3  . Ascorbic Acid (VITAMIN C PO) Take 2 tablets by mouth 2 (two) times daily.    [name] atorvastatin (LIPITOR) 10 MG tablet TAKE 1 TABLET BY MOUTH  DAILY 90 tablet 0  . canagliflozin (INVOKANA) 300 MG TABS tablet TAKE 1 TABLET BY MOUTH  DAILY BEFORE BREAKFAST 90 tablet 1  . docusate sodium (COLACE) 100 MG capsule Take 100 mg by mouth daily.    . Dulaglutide (TRULICITY) 1.5 ZJ/6.9CV SOPN Inject weekly 12 pen 1  . famotidine (PEPCID) 20 MG tablet Take 1 tablet (20 mg total) by mouth at bedtime. 90 tablet 3  . fenofibrate 160 MG tablet Take 1 tablet (160 mg total) daily by mouth. 90 tablet 3  . glucose blood (ONE TOUCH ULTRA TEST) test strip Use to check blood sugar 3 times per day 300 each 2  . levothyroxine (SYNTHROID, LEVOTHROID) 175 MCG tablet TAKE 1 TABLET BY MOUTH EVERY DAY 90 tablet 0  . lisinopril (PRINIVIL,ZESTRIL) 5 MG tablet Take 1 tablet (5 mg total) by mouth daily. 90 tablet 3  . metFORMIN (GLUCOPHAGE) 1000 MG tablet TAKE 1 TABLET BY MOUTH TWO  TIMES DAILY WITH A MEAL 180 tablet 1  . metoprolol  succinate (TOPROL-XL) 25 MG 24 hr tablet Take 1 tablet (25 mg total) by mouth daily. 90 tablet 0  . Multiple Vitamin (MULTIVITAMIN) tablet Take 1 tablet by mouth daily.    [name] omega-3 acid ethyl esters (LOVAZA) 1 g capsule TAKE 2 CAPSULES BY MOUTH  TWO TIMES DAILY 360 capsule 1  . ONETOUCH DELICA LANCETS 02V MISC Use to check blood sugar 3 times per day. 300 each 2  . rivaroxaban (XARELTO) 20 MG TABS tablet Take 1 tablet (20 mg total) by mouth daily with supper. 90 tablet 1   No current facility-administered medications on file prior to visit.     PAST MEDICAL HISTORY: Past Medical History:  Diagnosis Date  . Anxiety   . Atrial flutter ([place])    typical appearing; s/p ablation 12-2012  .  Constipation   . Depression    used to see psych, on no meds as of 03/2009  . Diabetes mellitus    TYPE 2  . Fatty liver   . GERD (gastroesophageal reflux disease)   . Gout   . Hyperlipidemia   . Hypothyroidism   . Leg edema   . Obesity   . Persistent atrial fibrillation ([place])    PVI 12/2012  . Prediabetes   . Rhinitis    vasomotor  . S/P Minimally invasive maze operation for atrial fibrillation 03/12/2016   Complete bilateral atrial lesion set using cryothermy and bipolar radiofrequency ablation with clipping of LA appendage via right mini thoracotomy approach  . S/P patent foramen ovale closure 03/12/2016  . Sleep apnea    mild, now treated with CPAP since ~9-12    PAST SURGICAL HISTORY: Past Surgical History:  Procedure Laterality Date  . ABLATION OF DYSRHYTHMIC FOCUS  01/05/2013   PVI and flutter ablation by [name]  . ATRIAL FIBRILLATION ABLATION N/A 01/05/2013   Procedure: ATRIAL FIBRILLATION ABLATION;  Surgeon: Enamorado Grayer, MD;  Location: [facility] CATH LAB;  Service: Cardiovascular;  Laterality: N/A;  . CARDIAC CATHETERIZATION N/A 01/12/2016   Procedure: Right/Left Heart Cath and Coronary Angiography;  Surgeon: [name], MD;  Location: [place] CV LAB;  Service: Cardiovascular;  Laterality: N/A;  . CARDIOVERSION  03/01/11  . CLIPPING OF ATRIAL APPENDAGE Left 03/12/2016   Procedure: CLIPPING OF ATRIAL APPENDAGE;  Surgeon: [name], MD;  Location: [place];  Service: Open Heart Surgery;  Laterality: Left;  . MINIMALLY INVASIVE MAZE PROCEDURE N/A 03/12/2016   Procedure: MINIMALLY INVASIVE MAZE PROCEDURE;  Surgeon: [name], MD;  Location: [place];  Service: Open Heart Surgery;  Laterality: N/A;  . NASAL SINUS SURGERY    . TEE WITHOUT CARDIOVERSION N/A 01/04/2013   Procedure: TRANSESOPHAGEAL ECHOCARDIOGRAM (TEE);  Surgeon: [name], MD;  Location: [facility] ENDOSCOPY;  Service: Cardiovascular;  Laterality: N/A;  . TEE WITHOUT CARDIOVERSION N/A 03/12/2016    Procedure: TRANSESOPHAGEAL ECHOCARDIOGRAM (TEE);  Surgeon: [name], MD;  Location: [place];  Service: Open Heart Surgery;  Laterality: N/A;  . TONSILLECTOMY    . VASECTOMY      SOCIAL HISTORY: Social History   Tobacco Use  . Smoking status: Never Smoker  . Smokeless tobacco: Never Used  Substance Use Topics  . Alcohol use: No  . Drug use: No    FAMILY HISTORY: Family History  Problem Relation Age of Onset  . Asthma Mother   . Breast cancer Mother   . Dementia Mother   . Diabetes Father   . Hyperlipidemia Father   . Obesity Father   .  Atrial fibrillation Father   . Hypertension Brother   . Melanoma Brother   . Allergies Daughter   . Throat cancer Maternal Uncle   . Colon cancer Paternal Grandfather   . Prostate cancer Neg Hx     ROS: Review of Systems  Constitutional: Negative for weight loss.  Gastrointestinal: Negative for nausea and vomiting.  Musculoskeletal:       Negative for muscle weakness  Psychiatric/Behavioral: Positive for depression. Negative for suicidal ideas.    PHYSICAL EXAM: Blood pressure 109/73, pulse 78, temperature 98 F (36.7 C), temperature source Oral, height 6\' 1"  (1.854 m), weight 220 lb (99.8 kg), SpO2 97 %. Body mass index is 29.03 kg/m. Physical Exam  Constitutional: He is oriented to person, place, and time. He appears well-developed and well-nourished.  Cardiovascular: Normal rate.  Pulmonary/Chest: Effort normal.  Musculoskeletal: Normal range of motion.  Neurological: He is oriented to person, place, and time.  Skin: Skin is warm and dry.  Psychiatric: He has a normal mood and affect. His behavior is normal.  Vitals reviewed.   RECENT LABS AND TESTS: BMET    Component Value Date/Time   NA 141 05/10/2017 1041   K 5.1 05/10/2017 1041   CL 102 05/10/2017 1041   CO2 25 05/10/2017 1041   GLUCOSE 106 (H) 05/10/2017 1041   GLUCOSE 103 (H) 03/17/2016 0312   GLUCOSE 154 04/01/2009 0000   BUN 24 05/10/2017 1041    CREATININE 1.14 05/10/2017 1041   CALCIUM 10.2 05/10/2017 1041   GFRNONAA 71 05/10/2017 1041   GFRAA 82 05/10/2017 1041   Lab Results  Component Value Date   HGBA1C 5.8 (H) 05/10/2017   HGBA1C 6.6 (H) 02/10/2017   HGBA1C 6.8 11/08/2016   HGBA1C 6.1 (H) 04/27/2016   HGBA1C 7.5 03/02/2016   Lab Results  Component Value Date   INSULIN 15.6 02/10/2017   CBC    Component Value Date/Time   WBC 6.1 02/10/2017 1131   WBC 7.5 03/16/2016 0439   RBC 5.22 02/10/2017 1131   RBC 3.85 (L) 03/16/2016 0439   HGB 15.1 02/10/2017 1131   HCT 43.0 02/10/2017 1131   PLT 542 (A) 03/30/2016   PLT 192 01/05/2016 0815   MCV 82 02/10/2017 1131   MCH 28.9 02/10/2017 1131   MCH 29.1 03/16/2016 0439   MCHC 35.1 02/10/2017 1131   MCHC 35.0 03/16/2016 0439   RDW 13.5 02/10/2017 1131   LYMPHSABS 2.2 02/10/2017 1131   EOSABS 0.1 02/10/2017 1131   BASOSABS 0.0 02/10/2017 1131   Iron/TIBC/Ferritin/ %Sat No results found for: IRON, TIBC, FERRITIN, IRONPCTSAT Lipid Panel     Component Value Date/Time   CHOL 171 02/10/2017 1131   TRIG 102 02/10/2017 1131   HDL 47 02/10/2017 1131   CHOLHDL 3.9 11/10/2016 0945   LDLCALC 104 (H) 02/10/2017 1131   Hepatic Function Panel     Component Value Date/Time   PROT 7.3 02/10/2017 1131   ALBUMIN 5.4 02/10/2017 1131   AST 22 02/10/2017 1131   ALT 32 02/10/2017 1131   ALKPHOS 43 02/10/2017 1131   BILITOT 0.5 02/10/2017 1131      Component Value Date/Time   TSH 0.417 (L) 05/10/2017 1041   TSH 0.208 (L) 02/10/2017 1131   TSH 0.198 (L) 11/10/2016 0945    ASSESSMENT AND PLAN: Vitamin D deficiency - Plan: Vitamin D, Ergocalciferol, (DRISDOL) 50000 units CAPS capsule  Other depression - with emotional eating - Plan: buPROPion (WELLBUTRIN SR) 200 MG 12 hr tablet  At risk for  osteoporosis  Class 1 obesity with serious comorbidity and body mass index (BMI) of 30.0 to 30.9 in adult, unspecified obesity type - starting BMI greater then 30  PLAN:  Vitamin  D Deficiency Hussain was informed that low vitamin D levels contributes to fatigue and are associated with obesity, breast, and colon cancer. He agrees to continue to take prescription Vit D @50 ,000 IU every week #4 with no refills and will follow up for routine testing of vitamin D, at least 2-3 times per year. He was informed of the risk of over-replacement of vitamin D and agrees to not increase his dose unless he discusses this with [place] first. Finian agrees to follow up with our clinic in 3 weeks.  At risk for osteopenia and osteoporosis Ritik is at risk for osteopenia and osteoporosis due to his vitamin D deficiency. He was encouraged to take his vitamin D and follow his higher calcium diet and increase strengthening exercise to help strengthen his bones and decrease his risk of osteopenia and osteoporosis.  Depression with Emotional Eating Behaviors We discussed behavior modification techniques today to help Izmael deal with his emotional eating and depression. He has agreed to continue Wellbutrin SR 200 mg qd #30 with no refills and follow up as directed.  Obesity Cecile is currently in the action stage of change. As such, his goal is to continue with weight loss efforts He has agreed to keep a food journal with 1500 to 1800 calories and 100 grams of protein daily Jaevian has been instructed to work up to a goal of 150 minutes of combined cardio and strengthening exercise per week for weight loss and overall health benefits. We discussed the following Behavioral Modification Strategies today: better snacking choices, increasing lean protein intake and work on meal planning and easy cooking plans  Kaylor has agreed to follow up with our clinic in 3 weeks. He was informed of the importance of frequent follow up visits to maximize his success with intensive lifestyle modifications for his multiple health conditions.   OBESITY BEHAVIORAL INTERVENTION VISIT  Today's visit was # 9 out of  22.  Starting weight: 244 lbs Starting date: 02/10/17 Today's weight : 220 lbs Today's date: 07/20/2017 Total lbs lost to date: 24 (Patients must lose 7 lbs in the first 6 months to continue with counseling)   ASK: We discussed the diagnosis of obesity with Loel Lofty today and Leory Plowman agreed to give [place] permission to discuss obesity behavioral modification therapy today.  ASSESS: Danil has the diagnosis of obesity and his BMI today is 29.03 Finnigan is in the action stage of change   ADVISE: Benjaman was educated on the multiple health risks of obesity as well as the benefit of weight loss to improve his health. He was advised of the need for long term treatment and the importance of lifestyle modifications.  AGREE: Multiple dietary modification options and treatment options were discussed and  Donie agreed to the above obesity treatment plan.   Corey Skains, am acting as transcriptionist for [employer], PA-C I, Lacy Duverney [facility], have reviewed this note and agree with its content.

## 2017-07-22 ENCOUNTER — Other Ambulatory Visit: Payer: Self-pay | Admitting: Endocrinology

## 2017-07-25 ENCOUNTER — Telehealth: Payer: Self-pay | Admitting: Endocrinology

## 2017-07-25 ENCOUNTER — Other Ambulatory Visit (INDEPENDENT_AMBULATORY_CARE_PROVIDER_SITE_OTHER): Payer: Self-pay | Admitting: Physician Assistant

## 2017-07-25 DIAGNOSIS — E559 Vitamin D deficiency, unspecified: Secondary | ICD-10-CM

## 2017-07-25 NOTE — Telephone Encounter (Signed)
Patient need a 90 day supply of medication Trulicity   Send to  [place], [address] AT [place] DEA #:  [id number]

## 2017-07-26 MED ORDER — DULAGLUTIDE 1.5 MG/0.5ML ~~LOC~~ SOAJ: SUBCUTANEOUS | 0 refills | Status: DC

## 2017-07-26 NOTE — Telephone Encounter (Signed)
New Rx sent for 90 day supply. See meds.

## 2017-08-02 ENCOUNTER — Other Ambulatory Visit (INDEPENDENT_AMBULATORY_CARE_PROVIDER_SITE_OTHER): Payer: Self-pay | Admitting: Physician Assistant

## 2017-08-02 DIAGNOSIS — F3289 Other specified depressive episodes: Secondary | ICD-10-CM

## 2017-08-05 ENCOUNTER — Other Ambulatory Visit: Payer: Self-pay | Admitting: Endocrinology

## 2017-08-05 DIAGNOSIS — E559 Vitamin D deficiency, unspecified: Secondary | ICD-10-CM | POA: Diagnosis not present

## 2017-08-05 DIAGNOSIS — E1165 Type 2 diabetes mellitus with hyperglycemia: Secondary | ICD-10-CM | POA: Diagnosis not present

## 2017-08-06 LAB — LIPID PANEL W/O CHOL/HDL RATIO
CHOLESTEROL TOTAL: 173 mg/dL (ref 100–199)
HDL: 54 mg/dL (ref >39)
LDL Calculated: 94 mg/dL (ref 0–99)
TRIGLYCERIDES: 127 mg/dL (ref 0–149)
VLDL Cholesterol Cal: 25 mg/dL (ref 5–40)

## 2017-08-06 LAB — VITAMIN D 25 HYDROXY (VIT D DEFICIENCY, FRACTURES): VIT D 25 HYDROXY: 40.7 ng/mL (ref 30.0–100.0)

## 2017-08-06 LAB — TSH: TSH: 1.15 u[IU]/mL (ref 0.450–4.500)

## 2017-08-06 LAB — T4, FREE: Free T4: 1.85 ng/dL — ABNORMAL HIGH (ref 0.82–1.77)

## 2017-08-06 LAB — HGB A1C W/O EAG: Hgb A1c MFr Bld: 6 % — ABNORMAL HIGH (ref 4.8–5.6)

## 2017-08-11 ENCOUNTER — Ambulatory Visit (INDEPENDENT_AMBULATORY_CARE_PROVIDER_SITE_OTHER): Payer: 59 | Admitting: Physician Assistant

## 2017-08-14 NOTE — Progress Notes (Signed)
Patient ID: Colin Wood, male   DOB: Nov 28, 1960, 57 y.o.   MRN: [id number]           Reason for Appointment: Follow-up for Type 2 Diabetes  Referring physician: Larose Kells  History of Present Illness:          Diagnosis: Type 2 diabetes mellitus, date of diagnosis: ?  2011        Past history: He was not having any symptoms at the time of diagnosis and not clear what his initial blood sugars were He was started on metformin initially and had fairly good control Subsequently with higher sugars he was given Amaryl also in addition which has been continued.  A1c was 10.6% in 2012 Subsequently in 2014 his A1c was down to 6.5 but he had a regular follow-up subsequently Because of an A1c of 8.1 in 02/2014 he was given Tradjenta in addition to the above drugs but this was not effective  He was referred here because of a high A1c of 7.9 in 07/2014  Recent history:   Oral hypoglycemic drugs the patient is taking are: metformin 1 g twice a day, Trulicity 1.5 mg weekly, Invokana 300 mg daily      A1c has stayed about the same and now is present compared to 5.8.6 on the previous visits  Current management, blood sugar values and problems identified:  He has continued on Trulicity and taking 1.5 mg weekly since about 11/18  Also with working with the weight loss program he has generally improve his diet  However has not lost any weight recently  Not able to time or motivation to exercise and he has not been walking or running  He is checking his blood sugars with 21 readings in the last month but has some inconsistent numbers  He says his blood sugars go up and he is having excessive carbohydrates and highest blood sugar was 273 at 9 PM  Otherwise his blood sugars can be as low as 100 fasting and overall average is fairly         Side effects from medications have been:None  Compliance with the medical regimen: Fair   Glucose monitoring:  done less than once a day       Glucometer:    One Touch.      Blood Glucose readings by review of monitor download:   Fasting range 100-154, MEDIAN 123 Nonfasting range 80-273 with sporadic high readings after lunch and dinner Overall MEDIAN 127  Self-care: The diet that the patient has been following is: None, he has difficulty controlling portions and carbohydrates at times    He is snacking on apples and yogurt  snacks           Exercise: Minimal programmed exercise           Dietician visit, most recent: none, previously had gone to a class          Weight history: Previous range 260-280  Wt Readings from Last 3 Encounters:  08/15/17 220 lb (99.8 kg)  07/20/17 220 lb (99.8 kg)  06/28/17 220 lb (99.8 kg)    Glycemic control:   Lab Results  Component Value Date   HGBA1C 6.0 (H) 08/05/2017   HGBA1C 5.8 (H) 05/10/2017   HGBA1C 6.6 (H) 02/10/2017   Lab Results  Component Value Date   LDLCALC 94 08/05/2017   CREATININE 1.14 05/10/2017      Allergies as of 08/15/2017   No Known Allergies  Medication List        Accurate as of 08/15/17  8:16 AM. Always use your most recent med list.          allopurinol 300 MG tablet Commonly known as:  ZYLOPRIM Take 1 tablet (300 mg total) daily by mouth.   atorvastatin 10 MG tablet Commonly known as:  LIPITOR TAKE 1 TABLET BY MOUTH  DAILY   buPROPion 200 MG 12 hr tablet Commonly known as:  WELLBUTRIN SR Take 1 tablet (200 mg total) by mouth daily at 12 noon.   canagliflozin 300 MG Tabs tablet Commonly known as:  INVOKANA TAKE 1 TABLET BY MOUTH  DAILY BEFORE BREAKFAST   docusate sodium 100 MG capsule Commonly known as:  COLACE Take 100 mg by mouth daily.   Dulaglutide 1.5 MG/0.5ML Sopn Commonly known as:  TRULICITY INJECT THE CONTENTS OF 1 PEN SUBCUTANEASLY WEEKLY AS DIRECTED   famotidine 20 MG tablet Commonly known as:  PEPCID Take 1 tablet (20 mg total) by mouth at bedtime.   fenofibrate 160 MG tablet Take 1 tablet (160 mg total) daily by mouth.     glucose blood test strip Commonly known as:  ONE TOUCH ULTRA TEST Use to check blood sugar 3 times per day   levothyroxine 175 MCG tablet Commonly known as:  SYNTHROID, LEVOTHROID TAKE 1 TABLET BY MOUTH EVERY DAY   lisinopril 5 MG tablet Commonly known as:  PRINIVIL,ZESTRIL Take 1 tablet (5 mg total) by mouth daily.   metFORMIN 1000 MG tablet Commonly known as:  GLUCOPHAGE TAKE 1 TABLET BY MOUTH TWO  TIMES DAILY WITH A MEAL   metoprolol succinate 25 MG 24 hr tablet Commonly known as:  TOPROL-XL Take 1 tablet (25 mg total) by mouth daily.   multivitamin tablet Take 1 tablet by mouth daily.   omega-3 acid ethyl esters 1 g capsule Commonly known as:  LOVAZA TAKE 2 CAPSULES BY MOUTH  TWO TIMES DAILY   ONETOUCH DELICA LANCETS 10X Misc Use to check blood sugar 3 times per day.   rivaroxaban 20 MG Tabs tablet Commonly known as:  XARELTO Take 1 tablet (20 mg total) by mouth daily with supper.   VITAMIN C PO Take 2 tablets by mouth 2 (two) times daily.   Vitamin D (Ergocalciferol) 50000 units Caps capsule Commonly known as:  DRISDOL Take 1 capsule (50,000 Units total) by mouth every 7 (seven) days.       Allergies:  No Known Allergies  Past Medical History:  Diagnosis Date  . Anxiety   . Atrial flutter ([place])    typical appearing; s/p ablation 12-2012  . Constipation   . Depression    used to see psych, on no meds as of 03/2009  . Diabetes mellitus    TYPE 2  . Fatty liver   . GERD (gastroesophageal reflux disease)   . Gout   . Hyperlipidemia   . Hypothyroidism   . Leg edema   . Obesity   . Persistent atrial fibrillation ([place])    PVI 12/2012  . Prediabetes   . Rhinitis    vasomotor  . S/P Minimally invasive maze operation for atrial fibrillation 03/12/2016   Complete bilateral atrial lesion set using cryothermy and bipolar radiofrequency ablation with clipping of LA appendage via right mini thoracotomy approach  . S/P patent foramen ovale closure  03/12/2016  . Sleep apnea    mild, now treated with CPAP since ~9-12    Past Surgical History:  Procedure Laterality Date  .  ABLATION OF DYSRHYTHMIC FOCUS  01/05/2013   PVI and flutter ablation by [name]  . ATRIAL FIBRILLATION ABLATION N/A 01/05/2013   Procedure: ATRIAL FIBRILLATION ABLATION;  Surgeon: Mijangos Grayer, MD;  Location[facility] CATH LAB;  Service: Cardiovascular;  Laterality: N/A;  . CARDIAC CATHETERIZATION N/A 01/12/2016   Procedure: Right/Left Heart Cath and Coronary Angiography;  Surgeon: [name], MD;  Location: [place] CV LAB;  Service: Cardiovascular;  Laterality: N/A;  . CARDIOVERSION  03/01/11  . CLIPPING OF ATRIAL APPENDAGE Left 03/12/2016   Procedure: CLIPPING OF ATRIAL APPENDAGE;  Surgeon: [name], MD;  Location: [place];  Service: Open Heart Surgery;  Laterality: Left;  . MINIMALLY INVASIVE MAZE PROCEDURE N/A 03/12/2016   Procedure: MINIMALLY INVASIVE MAZE PROCEDURE;  Surgeon: [name], MD;  Location: [place];  Service: Open Heart Surgery;  Laterality: N/A;  . NASAL SINUS SURGERY    . TEE WITHOUT CARDIOVERSION N/A 01/04/2013   Procedure: TRANSESOPHAGEAL ECHOCARDIOGRAM (TEE);  Surgeon: [name], MD;  Location: [facility] ENDOSCOPY;  Service: Cardiovascular;  Laterality: N/A;  . TEE WITHOUT CARDIOVERSION N/A 03/12/2016   Procedure: TRANSESOPHAGEAL ECHOCARDIOGRAM (TEE);  Surgeon: [name], MD;  Location: [place];  Service: Open Heart Surgery;  Laterality: N/A;  . TONSILLECTOMY    . VASECTOMY      Family History  Problem Relation Age of Onset  . Asthma Mother   . Breast cancer Mother   . Dementia Mother   . Diabetes Father   . Hyperlipidemia Father   . Obesity Father   . Atrial fibrillation Father   . Hypertension Brother   . Melanoma Brother   . Allergies Daughter   . Throat cancer Maternal Uncle   . Colon cancer Paternal Grandfather   . Prostate cancer Neg Hx     Social History:  reports that  has never smoked. he has never used  smokeless tobacco. He reports that he does not drink alcohol or use drugs.    Review of Systems         Lipids:  has high triglycerides mostly previously as high as 681; LDL previously also consistently high.   On Fenofibrate and taking Lipitor 10 mg daily  Has better LDL, previously over 100 and triglycerides again below 150       Lab Results  Component Value Date   CHOL 173 08/05/2017   HDL 54 08/05/2017   LDLCALC 94 08/05/2017   TRIG 127 08/05/2017   CHOLHDL 3.9 11/10/2016                   Thyroid: He was diagnosed to have hypothyroidism in 1994 has been on medication since then  He is compliant with his Synthroid and  has been on generic  Previously had been on 200 mcg dosage  Has been on 175 micrograms dosage, as further improvement in his TSH level, this was done from [facility] No unusual fatigue   Lab Results  Component Value Date   TSH 1.150 08/05/2017   TSH 0.417 (L) 05/10/2017   TSH 0.208 (L) 02/10/2017   FREET4 1.85 (H) 08/05/2017   FREET4 1.93 (H) 02/10/2017   FREET4 1.78 (H) 06/14/2016       The blood pressure has been Treated with low-dose lisinopril and metoprolol No lightheadedness  BP Readings from Last 3 Encounters:  08/15/17 110/82  07/20/17 109/73  06/28/17 105/71   Weekly D   Physical Examination:  BP 110/82 (BP Location: Left Arm, Patient Position: Sitting,  Cuff Size: Normal)   Pulse 78   Ht 6\' 1"  (1.854 m)   Wt 220 lb (99.8 kg)   SpO2 98%   BMI 29.03 kg/m     ASSESSMENT:  Diabetes type 2, with obesity He is on a 3 drug regimen of metformin, Trulicity 1.5 mg and Invokana 300 mg  See history of present illness for discussion of his current management, blood sugar patterns and problems identified. His A1c is 6% and generally staying better than before Although he is able to maintain his weight loss with generally watching his diet and continuing Trulicity he has periodically high readings when he goes off his diet with more  carbohydrate intake He does try to check his blood sugar when he goes off his diet and subsequently improves his compliance   HYPERTRIGLYCERIDEMIA:  improved along with LDL which is now below 100 If he is able to be consistently good on diet and cut back on his weight further may consider stopping fenofibrate   HYPOTHYROIDISM: TSH has come back to normal with 175 mcg levothyroxine subjectively doing good He will continue the same dose    PLAN:   He will try to be more consistent with diet especially carbohydrate intake  Encouraged him to start walking program evenings  Continue checking blood sugars at various times including after meals  Follow-up with A1c again in 6 months   Parker Sawatzky 08/15/2017, 8:16 AM   Note: This office note was prepared with Dragon voice recognition system technology. Any transcriptional errors that result from this process are unintentional.

## 2017-08-15 ENCOUNTER — Ambulatory Visit: Payer: 59 | Admitting: Endocrinology

## 2017-08-15 ENCOUNTER — Encounter: Payer: Self-pay | Admitting: Endocrinology

## 2017-08-15 VITALS — BP 110/82 | HR 78 | Ht 73.0 in | Wt 220.0 lb

## 2017-08-15 DIAGNOSIS — E559 Vitamin D deficiency, unspecified: Secondary | ICD-10-CM

## 2017-08-15 DIAGNOSIS — E1165 Type 2 diabetes mellitus with hyperglycemia: Secondary | ICD-10-CM

## 2017-08-15 MED ORDER — VITAMIN D (ERGOCALCIFEROL) 1.25 MG (50000 UNIT) PO CAPS: 50000.0000 [IU] | ORAL_CAPSULE | ORAL | 0 refills | Status: DC

## 2017-08-15 MED ORDER — ATORVASTATIN CALCIUM 10 MG PO TABS: 10.0000 mg | ORAL_TABLET | Freq: Every day | ORAL | 2 refills | Status: DC

## 2017-08-15 MED ORDER — OMEGA-3-ACID ETHYL ESTERS 1 G PO CAPS: ORAL_CAPSULE | ORAL | 1 refills | Status: DC

## 2017-08-15 MED ORDER — CANAGLIFLOZIN 300 MG PO TABS: ORAL_TABLET | ORAL | 1 refills | Status: DC

## 2017-08-15 MED ORDER — LEVOTHYROXINE SODIUM 175 MCG PO TABS: 175.0000 ug | ORAL_TABLET | Freq: Every day | ORAL | 0 refills | Status: DC

## 2017-08-15 NOTE — Patient Instructions (Signed)
Check blood sugars on waking up    Also check blood sugars about 2 hours after a meal and do this after different meals by rotation  Recommended blood sugar levels on waking up is 90-130 and about 2 hours after meal is 130-160  Please bring your blood sugar monitor to each visit, thank you  

## 2017-08-18 ENCOUNTER — Ambulatory Visit (INDEPENDENT_AMBULATORY_CARE_PROVIDER_SITE_OTHER): Payer: 59 | Admitting: Physician Assistant

## 2017-08-18 VITALS — BP 100/67 | HR 70 | Temp 98.0°F | Ht 73.0 in | Wt 222.0 lb

## 2017-08-18 DIAGNOSIS — Z6830 Body mass index (BMI) 30.0-30.9, adult: Secondary | ICD-10-CM | POA: Diagnosis not present

## 2017-08-18 DIAGNOSIS — E669 Obesity, unspecified: Secondary | ICD-10-CM

## 2017-08-18 DIAGNOSIS — E559 Vitamin D deficiency, unspecified: Secondary | ICD-10-CM

## 2017-08-18 NOTE — Progress Notes (Signed)
Office: [phone]  /  Fax: [phone]   HPI:   Chief Complaint: OBESITY Colin Wood is here to discuss his progress with his obesity treatment plan. He is on the  keep a food journal with 1500 to 1800 calories and 100 grams of protein daily and is following his eating plan approximately 20 % of the time. He states he is doing yard work for exercise 1 time per week. Colin Wood has been busy with work and has not been following the meal plan. He is motivated to get back on track and continue with weight loss. His weight is 222 lb (100.7 kg) today and has had a weight gain of 2 pounds over a period of 4 weeks since his last visit. He has lost 22 lbs since starting treatment with [place].  Vitamin D deficiency Colin Wood has a diagnosis of vitamin D deficiency. He is currently taking vit D and denies nausea, vomiting or muscle weakness.  ALLERGIES: No Known Allergies  MEDICATIONS: Current Outpatient Medications on File Prior to Visit  Medication Sig Dispense Refill  . allopurinol (ZYLOPRIM) 300 MG tablet Take 1 tablet (300 mg total) daily by mouth. 90 tablet 3  . Ascorbic Acid (VITAMIN C PO) Take 2 tablets by mouth 2 (two) times daily.    [name] atorvastatin (LIPITOR) 10 MG tablet Take 1 tablet (10 mg total) by mouth daily. 90 tablet 2  . buPROPion (WELLBUTRIN SR) 200 MG 12 hr tablet Take 1 tablet (200 mg total) by mouth daily at 12 noon. 30 tablet 0  . canagliflozin (INVOKANA) 300 MG TABS tablet TAKE 1 TABLET BY MOUTH  DAILY BEFORE BREAKFAST 90 tablet 1  . docusate sodium (COLACE) 100 MG capsule Take 100 mg by mouth daily.    . Dulaglutide (TRULICITY) 1.5 VO/5.3GU SOPN INJECT THE CONTENTS OF 1 PEN SUBCUTANEASLY WEEKLY AS DIRECTED 12 pen 0  . famotidine (PEPCID) 20 MG tablet Take 1 tablet (20 mg total) by mouth at bedtime. 90 tablet 3  . fenofibrate 160 MG tablet Take 1 tablet (160 mg total) daily by mouth. 90 tablet 3  . glucose blood (ONE TOUCH ULTRA TEST) test strip Use to check blood sugar 3 times  per day 300 each 2  . levothyroxine (SYNTHROID, LEVOTHROID) 175 MCG tablet Take 1 tablet (175 mcg total) by mouth daily. 90 tablet 0  . lisinopril (PRINIVIL,ZESTRIL) 5 MG tablet Take 1 tablet (5 mg total) by mouth daily. 90 tablet 3  . metFORMIN (GLUCOPHAGE) 1000 MG tablet TAKE 1 TABLET BY MOUTH TWO  TIMES DAILY WITH A MEAL 180 tablet 1  . metoprolol succinate (TOPROL-XL) 25 MG 24 hr tablet Take 1 tablet (25 mg total) by mouth daily. 90 tablet 0  . Multiple Vitamin (MULTIVITAMIN) tablet Take 1 tablet by mouth daily.    [name] omega-3 acid ethyl esters (LOVAZA) 1 g capsule TAKE 2 CAPSULES BY MOUTH  TWO TIMES DAILY 360 capsule 1  . ONETOUCH DELICA LANCETS 44I MISC Use to check blood sugar 3 times per day. 300 each 2  . rivaroxaban (XARELTO) 20 MG TABS tablet Take 1 tablet (20 mg total) by mouth daily with supper. 90 tablet 1  . Vitamin D, Ergocalciferol, (DRISDOL) 50000 units CAPS capsule Take 1 capsule (50,000 Units total) by mouth every 7 (seven) days. 4 capsule 0   No current facility-administered medications on file prior to visit.     PAST MEDICAL HISTORY: Past Medical History:  Diagnosis Date  . Anxiety   . Atrial flutter ([place])  typical appearing; s/p ablation 12-2012  . Constipation   . Depression    used to see psych, on no meds as of 03/2009  . Diabetes mellitus    TYPE 2  . Fatty liver   . GERD (gastroesophageal reflux disease)   . Gout   . Hyperlipidemia   . Hypothyroidism   . Leg edema   . Obesity   . Persistent atrial fibrillation ([place])    PVI 12/2012  . Prediabetes   . Rhinitis    vasomotor  . S/P Minimally invasive maze operation for atrial fibrillation 03/12/2016   Complete bilateral atrial lesion set using cryothermy and bipolar radiofrequency ablation with clipping of LA appendage via right mini thoracotomy approach  . S/P patent foramen ovale closure 03/12/2016  . Sleep apnea    mild, now treated with CPAP since ~9-12    PAST SURGICAL HISTORY: Past Surgical  History:  Procedure Laterality Date  . ABLATION OF DYSRHYTHMIC FOCUS  01/05/2013   PVI and flutter ablation by [name]  . ATRIAL FIBRILLATION ABLATION N/A 01/05/2013   Procedure: ATRIAL FIBRILLATION ABLATION;  Surgeon: Elliston Grayer, MD;  Location: [facility] CATH LAB;  Service: Cardiovascular;  Laterality: N/A;  . CARDIAC CATHETERIZATION N/A 01/12/2016   Procedure: Right/Left Heart Cath and Coronary Angiography;  Surgeon: [name], MD;  Location: [place] CV LAB;  Service: Cardiovascular;  Laterality: N/A;  . CARDIOVERSION  03/01/11  . CLIPPING OF ATRIAL APPENDAGE Left 03/12/2016   Procedure: CLIPPING OF ATRIAL APPENDAGE;  Surgeon: [name], MD;  Location: [place];  Service: Open Heart Surgery;  Laterality: Left;  . MINIMALLY INVASIVE MAZE PROCEDURE N/A 03/12/2016   Procedure: MINIMALLY INVASIVE MAZE PROCEDURE;  Surgeon: [name], MD;  Location: [place];  Service: Open Heart Surgery;  Laterality: N/A;  . NASAL SINUS SURGERY    . TEE WITHOUT CARDIOVERSION N/A 01/04/2013   Procedure: TRANSESOPHAGEAL ECHOCARDIOGRAM (TEE);  Surgeon: [name], MD;  Location: [facility] ENDOSCOPY;  Service: Cardiovascular;  Laterality: N/A;  . TEE WITHOUT CARDIOVERSION N/A 03/12/2016   Procedure: TRANSESOPHAGEAL ECHOCARDIOGRAM (TEE);  Surgeon: [name], MD;  Location: [place];  Service: Open Heart Surgery;  Laterality: N/A;  . TONSILLECTOMY    . VASECTOMY      SOCIAL HISTORY: Social History   Tobacco Use  . Smoking status: Never Smoker  . Smokeless tobacco: Never Used  Substance Use Topics  . Alcohol use: No  . Drug use: No    FAMILY HISTORY: Family History  Problem Relation Age of Onset  . Asthma Mother   . Breast cancer Mother   . Dementia Mother   . Diabetes Father   . Hyperlipidemia Father   . Obesity Father   . Atrial fibrillation Father   . Hypertension Brother   . Melanoma Brother   . Allergies Daughter   . Throat cancer Maternal Uncle   . Colon cancer Paternal Grandfather     . Prostate cancer Neg Hx     ROS: Review of Systems  Constitutional: Negative for weight loss.  Gastrointestinal: Negative for nausea and vomiting.  Musculoskeletal:       Negative for muscle weakness    PHYSICAL EXAM: Blood pressure 100/67, pulse 70, temperature 98 F (36.7 C), temperature source Oral, height 6\' 1"  (1.854 m), weight 222 lb (100.7 kg), SpO2 98 %. Body mass index is 29.29 kg/m. Physical Exam  Constitutional: He is oriented to person, place, and time. He appears well-developed and well-nourished.  Cardiovascular: Normal rate.  Pulmonary/Chest: Effort normal.  Musculoskeletal: Normal range of motion.  Neurological: He is oriented to person, place, and time.  Skin: Skin is warm and dry.  Psychiatric: He has a normal mood and affect. His behavior is normal.  Vitals reviewed.   RECENT LABS AND TESTS: BMET    Component Value Date/Time   NA 141 05/10/2017 1041   K 5.1 05/10/2017 1041   CL 102 05/10/2017 1041   CO2 25 05/10/2017 1041   GLUCOSE 106 (H) 05/10/2017 1041   GLUCOSE 103 (H) 03/17/2016 0312   GLUCOSE 154 04/01/2009 0000   BUN 24 05/10/2017 1041   CREATININE 1.14 05/10/2017 1041   CALCIUM 10.2 05/10/2017 1041   GFRNONAA 71 05/10/2017 1041   GFRAA 82 05/10/2017 1041   Lab Results  Component Value Date   HGBA1C 6.0 (H) 08/05/2017   HGBA1C 5.8 (H) 05/10/2017   HGBA1C 6.6 (H) 02/10/2017   HGBA1C 6.8 11/08/2016   HGBA1C 6.1 (H) 04/27/2016   Lab Results  Component Value Date   INSULIN 15.6 02/10/2017   CBC    Component Value Date/Time   WBC 6.1 02/10/2017 1131   WBC 7.5 03/16/2016 0439   RBC 5.22 02/10/2017 1131   RBC 3.85 (L) 03/16/2016 0439   HGB 15.1 02/10/2017 1131   HCT 43.0 02/10/2017 1131   PLT 542 (A) 03/30/2016   PLT 192 01/05/2016 0815   MCV 82 02/10/2017 1131   MCH 28.9 02/10/2017 1131   MCH 29.1 03/16/2016 0439   MCHC 35.1 02/10/2017 1131   MCHC 35.0 03/16/2016 0439   RDW 13.5 02/10/2017 1131   LYMPHSABS 2.2 02/10/2017  1131   EOSABS 0.1 02/10/2017 1131   BASOSABS 0.0 02/10/2017 1131   Iron/TIBC/Ferritin/ %Sat No results found for: IRON, TIBC, FERRITIN, IRONPCTSAT Lipid Panel     Component Value Date/Time   CHOL 173 08/05/2017 0741   TRIG 127 08/05/2017 0741   HDL 54 08/05/2017 0741   CHOLHDL 3.9 11/10/2016 0945   LDLCALC 94 08/05/2017 0741   Hepatic Function Panel     Component Value Date/Time   PROT 7.3 02/10/2017 1131   ALBUMIN 5.4 02/10/2017 1131   AST 22 02/10/2017 1131   ALT 32 02/10/2017 1131   ALKPHOS 43 02/10/2017 1131   BILITOT 0.5 02/10/2017 1131      Component Value Date/Time   TSH 1.150 08/05/2017 0741   TSH 0.417 (L) 05/10/2017 1041   TSH 0.208 (L) 02/10/2017 1131   Results for NEHEMIAH, MCFARREN (MRN [id number]) as of 08/18/2017 17:42  Ref. Range 08/05/2017 07:41  Vitamin D, 25-Hydroxy Latest Ref Range: 30.0 - 100.0 ng/mL 40.7   ASSESSMENT AND PLAN: Vitamin D deficiency  Class 1 obesity with serious comorbidity and body mass index (BMI) of 30.0 to 30.9 in adult, unspecified obesity type - Starting BMI greater then 30  PLAN:  Vitamin D Deficiency Colin Wood was informed that low vitamin D levels contributes to fatigue and are associated with obesity, breast, and colon cancer. He agrees to continue to take prescription Vit D @50 ,000 IU every week and will follow up for routine testing of vitamin D, at least 2-3 times per year. He was informed of the risk of over-replacement of vitamin D and agrees to not increase his dose unless he discusses this with [place] first.  We spent > than 50% of the 15 minute visit on the counseling as documented in the note.  Obesity Colin Wood is currently in the action stage of change. As such, his goal is to  continue with weight loss efforts He has agreed to follow the Category 4 plan Colin Wood has been instructed to work up to a goal of 150 minutes of combined cardio and strengthening exercise per week for weight loss and overall health benefits. We  discussed the following Behavioral Modification Strategies today: increasing lean protein intake and work on meal planning and easy cooking plans  Colin Wood has agreed to follow up with our clinic in 3 weeks. He was informed of the importance of frequent follow up visits to maximize his success with intensive lifestyle modifications for his multiple health conditions.   OBESITY BEHAVIORAL INTERVENTION VISIT  Today's visit was # 10 out of 22.  Starting weight: 244 lbs Starting date: 02/10/17 Today's weight : 222 lbs Today's date: 08/18/2017 Total lbs lost to date: 22 (Patients must lose 7 lbs in the first 6 months to continue with counseling)   ASK: We discussed the diagnosis of obesity with Colin Wood today and Colin Wood agreed to give [place] permission to discuss obesity behavioral modification therapy today.  ASSESS: Colin Wood has the diagnosis of obesity and his BMI today is 29.3 Colin Wood is in the action stage of change   ADVISE: Colin Wood was educated on the multiple health risks of obesity as well as the benefit of weight loss to improve his health. He was advised of the need for long term treatment and the importance of lifestyle modifications.  AGREE: Multiple dietary modification options and treatment options were discussed and  Colin Wood agreed to the above obesity treatment plan.   Corey Skains, am acting as transcriptionist for [employer], PA-C I, Lacy Duverney [facility], have reviewed this note and agree with its content

## 2017-09-08 ENCOUNTER — Ambulatory Visit (INDEPENDENT_AMBULATORY_CARE_PROVIDER_SITE_OTHER): Payer: 59 | Admitting: Physician Assistant

## 2017-09-14 ENCOUNTER — Other Ambulatory Visit: Payer: Self-pay | Admitting: Endocrinology

## 2017-09-14 DIAGNOSIS — E559 Vitamin D deficiency, unspecified: Secondary | ICD-10-CM

## 2017-09-21 ENCOUNTER — Other Ambulatory Visit: Payer: Self-pay | Admitting: Endocrinology

## 2017-09-21 DIAGNOSIS — E559 Vitamin D deficiency, unspecified: Secondary | ICD-10-CM

## 2017-09-22 MED ORDER — VITAMIN D (ERGOCALCIFEROL) 1.25 MG (50000 UNIT) PO CAPS: 50000.0000 [IU] | ORAL_CAPSULE | ORAL | 0 refills | Status: DC

## 2017-09-29 ENCOUNTER — Other Ambulatory Visit: Payer: Self-pay | Admitting: Endocrinology

## 2017-10-07 ENCOUNTER — Other Ambulatory Visit: Payer: Self-pay | Admitting: Endocrinology

## 2017-10-17 ENCOUNTER — Other Ambulatory Visit: Payer: Self-pay | Admitting: Endocrinology

## 2017-10-21 ENCOUNTER — Ambulatory Visit: Payer: 59 | Admitting: Internal Medicine

## 2017-10-21 ENCOUNTER — Encounter: Payer: Self-pay | Admitting: Internal Medicine

## 2017-10-21 VITALS — BP 114/68 | HR 78 | Resp 95 | Ht 73.0 in | Wt 230.0 lb

## 2017-10-21 DIAGNOSIS — G4733 Obstructive sleep apnea (adult) (pediatric): Secondary | ICD-10-CM | POA: Diagnosis not present

## 2017-10-21 DIAGNOSIS — I4819 Other persistent atrial fibrillation: Secondary | ICD-10-CM

## 2017-10-21 DIAGNOSIS — I481 Persistent atrial fibrillation: Secondary | ICD-10-CM

## 2017-10-21 MED ORDER — METOPROLOL SUCCINATE ER 25 MG PO TB24: 25.0000 mg | ORAL_TABLET | Freq: Every day | ORAL | 3 refills | Status: DC

## 2017-10-21 MED ORDER — LISINOPRIL 5 MG PO TABS: 5.0000 mg | ORAL_TABLET | Freq: Every day | ORAL | 3 refills | Status: DC

## 2017-10-21 MED ORDER — RIVAROXABAN 20 MG PO TABS: 20.0000 mg | ORAL_TABLET | Freq: Every day | ORAL | 3 refills | Status: DC

## 2017-10-21 NOTE — Progress Notes (Signed)
PCP: Colon Branch, MD   Primary EP: [name]  Colin Wood is a 57 y.o. male who presents today for routine electrophysiology followup.  Since last being seen in our clinic, the patient reports doing very well.  Today, he denies symptoms of palpitations, chest pain, shortness of breath,  lower extremity edema, dizziness, presyncope, or syncope.  The patient is otherwise without complaint today.   Past Medical History:  Diagnosis Date  . Anxiety   . Atrial flutter ([place])    typical appearing; s/p ablation 12-2012  . Constipation   . Depression    used to see psych, on no meds as of 03/2009  . Diabetes mellitus    TYPE 2  . Fatty liver   . GERD (gastroesophageal reflux disease)   . Gout   . Hyperlipidemia   . Hypothyroidism   . Leg edema   . Obesity   . Persistent atrial fibrillation ([name])    PVI 12/2012  . Prediabetes   . Rhinitis    vasomotor  . S/P Minimally invasive maze operation for atrial fibrillation 03/12/2016   Complete bilateral atrial lesion set using cryothermy and bipolar radiofrequency ablation with clipping of LA appendage via right mini thoracotomy approach  . S/P patent foramen ovale closure 03/12/2016  . Sleep apnea    mild, now treated with CPAP since ~9-12   Past Surgical History:  Procedure Laterality Date  . ABLATION OF DYSRHYTHMIC FOCUS  01/05/2013   PVI and flutter ablation by [name]  . ATRIAL FIBRILLATION ABLATION N/A 01/05/2013   Procedure: ATRIAL FIBRILLATION ABLATION;  Surgeon: Pennock Grayer, MD;  Location: [facility] CATH LAB;  Service: Cardiovascular;  Laterality: N/A;  . CARDIAC CATHETERIZATION N/A 01/12/2016   Procedure: Right/Left Heart Cath and Coronary Angiography;  Surgeon: [name], MD;  Location: [place] CV LAB;  Service: Cardiovascular;  Laterality: N/A;  . CARDIOVERSION  03/01/11  . CLIPPING OF ATRIAL APPENDAGE Left 03/12/2016   Procedure: CLIPPING OF ATRIAL APPENDAGE;  Surgeon: [name], MD;  Location: [place];  Service:  Open Heart Surgery;  Laterality: Left;  . MINIMALLY INVASIVE MAZE PROCEDURE N/A 03/12/2016   Procedure: MINIMALLY INVASIVE MAZE PROCEDURE;  Surgeon: [name], MD;  Location: [place];  Service: Open Heart Surgery;  Laterality: N/A;  . NASAL SINUS SURGERY    . TEE WITHOUT CARDIOVERSION N/A 01/04/2013   Procedure: TRANSESOPHAGEAL ECHOCARDIOGRAM (TEE);  Surgeon: [name], MD;  Location[facility] ENDOSCOPY;  Service: Cardiovascular;  Laterality: N/A;  . TEE WITHOUT CARDIOVERSION N/A 03/12/2016   Procedure: TRANSESOPHAGEAL ECHOCARDIOGRAM (TEE);  Surgeon: [name], MD;  Location: [place];  Service: Open Heart Surgery;  Laterality: N/A;  . TONSILLECTOMY    . VASECTOMY      ROS- all systems are reviewed and negatives except as per HPI above  Current Outpatient Medications  Medication Sig Dispense Refill  . allopurinol (ZYLOPRIM) 300 MG tablet Take 1 tablet (300 mg total) daily by mouth. 90 tablet 3  . Ascorbic Acid (VITAMIN C PO) Take 2 tablets by mouth 2 (two) times daily.    [name] atorvastatin (LIPITOR) 10 MG tablet TAKE ONE TABLET BY MOUTH DAILY 90 tablet 0  . canagliflozin (INVOKANA) 300 MG TABS tablet TAKE 1 TABLET BY MOUTH  DAILY BEFORE BREAKFAST 90 tablet 1  . docusate sodium (COLACE) 100 MG capsule Take 100 mg by mouth daily.    . Dulaglutide (TRULICITY) 1.5 QQ/7.6PP SOPN INJECT THE CONTENTS OF 1 PEN SUBCUTANEASLY WEEKLY AS  DIRECTED 12 pen 0  . famotidine (PEPCID) 20 MG tablet Take 1 tablet (20 mg total) by mouth at bedtime. 90 tablet 3  . fenofibrate 160 MG tablet Take 1 tablet (160 mg total) daily by mouth. 90 tablet 3  . glucose blood (ONE TOUCH ULTRA TEST) test strip Use to check blood sugar 3 times per day 300 each 2  . levothyroxine (SYNTHROID, LEVOTHROID) 175 MCG tablet TAKE 1 TABLET BY MOUTH EVERY DAY 90 tablet 0  . lisinopril (PRINIVIL,ZESTRIL) 5 MG tablet Take 1 tablet (5 mg total) by mouth daily. 90 tablet 3  . metFORMIN (GLUCOPHAGE) 1000 MG tablet TAKE 1 TABLET BY MOUTH TWO   TIMES DAILY WITH A MEAL 180 tablet 1  . metoprolol succinate (TOPROL-XL) 25 MG 24 hr tablet Take 1 tablet (25 mg total) by mouth daily. 90 tablet 0  . Multiple Vitamin (MULTIVITAMIN) tablet Take 1 tablet by mouth daily.    [name] omega-3 acid ethyl esters (LOVAZA) 1 g capsule TAKE 2 CAPSULES BY MOUTH TWICE DAILY 360 capsule 0  . ONETOUCH DELICA LANCETS 16W MISC Use to check blood sugar 3 times per day. 300 each 2  . rivaroxaban (XARELTO) 20 MG TABS tablet Take 1 tablet (20 mg total) by mouth daily with supper. 90 tablet 1  . Vitamin D, Ergocalciferol, (DRISDOL) 50000 units CAPS capsule Take 1 capsule (50,000 Units total) by mouth every 7 (seven) days. 12 capsule 0   No current facility-administered medications for this visit.     Physical Exam: Vitals:   10/21/17 1556  BP: 114/68  Pulse: 78  Resp: (!) 95  Weight: 230 lb (104.3 kg)  Height: 6\' 1"  (1.854 m)    GEN- The patient is well appearing, alert and oriented x 3 today.   Head- normocephalic, atraumatic Eyes-  Sclera clear, conjunctiva pink Ears- hearing intact Oropharynx- clear Lungs- Clear to ausculation bilaterally, normal work of breathing Heart- Regular rate and rhythm, no murmurs, rubs or gallops, PMI not laterally displaced GI- soft, NT, ND, + BS Extremities- no clubbing, cyanosis, or edema  Wt Readings from Last 3 Encounters:  10/21/17 230 lb (104.3 kg)  08/18/17 222 lb (100.7 kg)  08/15/17 220 lb (99.8 kg)    EKG tracing ordered today is personally reviewed and shows sinus rhythm 78 bpm, RBBB, LAD  Assessment and Plan:  1. Persistent afib Well controlled off AAD therapy post maze On xarelto for chads2vasc score of 2  2. [name] Stable No change required today  3. Obesity Body mass index is 30.34 kg/m. He has gained weight since his last visit  4. Tachycardia mediated CM Resolved with sinus rhythm Echo 3/18 reviewed again today  Return to see Butch Penny in AF clinic in 6 months  Beidler Grayer MD,  [facility] 10/21/2017 4:29 PM

## 2017-10-21 NOTE — Patient Instructions (Addendum)
Medication Instructions:  Your physician recommends that you continue on your current medications as directed. Please refer to the Current Medication list given to you today.  Labwork: None ordered.  Testing/Procedures: None ordered.  Follow-Up: Your physician wants you to follow-up in: 6 months with Roderic Palau, NP.    Your physician wants you to follow-up in: one year with [name].   You will receive a reminder letter in the mail two months in advance. If you don't receive a letter, please call our office to schedule the follow-up appointment.  Any Other Special Instructions Will Be Listed Below (If Applicable).  If you need a refill on your cardiac medications before your next appointment, please call your pharmacy.

## 2017-10-26 ENCOUNTER — Other Ambulatory Visit: Payer: Self-pay | Admitting: Endocrinology

## 2017-12-03 ENCOUNTER — Other Ambulatory Visit: Payer: Self-pay | Admitting: Endocrinology

## 2017-12-07 ENCOUNTER — Other Ambulatory Visit: Payer: Self-pay | Admitting: Endocrinology

## 2017-12-07 DIAGNOSIS — E1165 Type 2 diabetes mellitus with hyperglycemia: Secondary | ICD-10-CM | POA: Diagnosis not present

## 2017-12-08 LAB — COMPREHENSIVE METABOLIC PANEL
ALT: 24 IU/L (ref 0–44)
AST: 21 IU/L (ref 0–40)
Albumin/Globulin Ratio: 2.5 — ABNORMAL HIGH (ref 1.2–2.2)
Albumin: 5 g/dL (ref 3.5–5.5)
Alkaline Phosphatase: 38 IU/L — ABNORMAL LOW (ref 39–117)
BUN/Creatinine Ratio: 19 (ref 9–20)
BUN: 22 mg/dL (ref 6–24)
Bilirubin Total: 0.7 mg/dL (ref 0.0–1.2)
CALCIUM: 10 mg/dL (ref 8.7–10.2)
CHLORIDE: 102 mmol/L (ref 96–106)
CO2: 22 mmol/L (ref 20–29)
Creatinine, Ser: 1.15 mg/dL (ref 0.76–1.27)
GFR, EST AFRICAN AMERICAN: 81 mL/min/{1.73_m2} (ref >59)
GFR, EST NON AFRICAN AMERICAN: 70 mL/min/{1.73_m2} (ref >59)
GLUCOSE: 123 mg/dL — AB (ref 65–99)
Globulin, Total: 2 g/dL (ref 1.5–4.5)
Potassium: 4.8 mmol/L (ref 3.5–5.2)
Sodium: 143 mmol/L (ref 134–144)
TOTAL PROTEIN: 7 g/dL (ref 6.0–8.5)

## 2017-12-08 LAB — LIPID PANEL
Chol/HDL Ratio: 3.6 ratio (ref 0.0–5.0)
Cholesterol, Total: 164 mg/dL (ref 100–199)
HDL: 45 mg/dL (ref >39)
LDL CALC: 83 mg/dL (ref 0–99)
TRIGLYCERIDES: 180 mg/dL — AB (ref 0–149)
VLDL Cholesterol Cal: 36 mg/dL (ref 5–40)

## 2017-12-08 LAB — HEMOGLOBIN A1C
Est. average glucose Bld gHb Est-mCnc: 131 mg/dL
HEMOGLOBIN A1C: 6.2 % — AB (ref 4.8–5.6)

## 2017-12-08 LAB — TSH: TSH: 0.5 u[IU]/mL (ref 0.450–4.500)

## 2017-12-14 ENCOUNTER — Other Ambulatory Visit: Payer: Self-pay | Admitting: Endocrinology

## 2017-12-14 DIAGNOSIS — E559 Vitamin D deficiency, unspecified: Secondary | ICD-10-CM

## 2017-12-14 NOTE — Progress Notes (Signed)
Patient ID: Colin Wood, male   DOB: 1960-08-18, 57 y.o.   MRN: [id number]           Reason for Appointment: Follow-up for Type 2 Diabetes  Referring physician: Larose Kells  History of Present Illness:          Diagnosis: Type 2 diabetes mellitus, date of diagnosis: ?  2011        Past history: He was not having any symptoms at the time of diagnosis and not clear what his initial blood sugars were He was started on metformin initially and had fairly good control Subsequently with higher sugars he was given Amaryl also in addition which has been continued.  A1c was 10.6% in 2012 Subsequently in 2014 his A1c was down to 6.5 but he had a regular follow-up subsequently Because of an A1c of 8.1 in 02/2014 he was given Tradjenta in addition to the above drugs but this was not effective  He was referred here because of a high A1c of 7.9 in 07/2014  Recent history:   Oral hypoglycemic drugs the patient is taking are: metformin 1 g twice a day, Trulicity 1.5 mg weekly, Invokana 300 mg daily      A1c has been excellent although gradually trending higher, now 6.2 compared to 5.8 in December  Current management, blood sugar values and problems identified:  He has checked his blood sugars somewhat sporadically and mostly in the morning  Although he was previously losing weight and doing well on his diet with the weight management program he has not been going back for follow-up  Also has difficulty watching portions and snacks and tending to eat even when not hungry  He is being continued on Trulicity 1.5 mg weekly  Also has not done any formal exercise except some yard work  Fasting readings have been as high as 172         Side effects from medications have been:None  Compliance with the medical regimen: Fair   Glucose monitoring:  done less than once a day       Glucometer:   One Touch.      Blood Glucose readings by review of monitor download:   Fasting range 112-172 with median  about 126  Overall median 123  Self-care: The diet that the patient has been following is: None, he has difficulty controlling portions and carbohydrates at times    May be snacking on apples and yogurt           Exercise: Minimal programmed exercise           Dietician visit, most recent: none, previously had gone to a class          Weight history: Previous range 260-280  Wt Readings from Last 3 Encounters:  12/15/17 232 lb (105.2 kg)  10/21/17 230 lb (104.3 kg)  08/18/17 222 lb (100.7 kg)    Glycemic control:   Lab Results  Component Value Date   HGBA1C 6.2 (H) 12/07/2017   HGBA1C 6.0 (H) 08/05/2017   HGBA1C 5.8 (H) 05/10/2017   Lab Results  Component Value Date   LDLCALC 83 12/07/2017   CREATININE 1.15 12/07/2017      Allergies as of 12/15/2017   No Known Allergies     Medication List        Accurate as of 12/15/17 11:02 AM. Always use your most recent med list.          allopurinol 300 MG tablet Commonly known as:  ZYLOPRIM Take 1 tablet (300 mg total) daily by mouth.   atorvastatin 10 MG tablet Commonly known as:  LIPITOR TAKE ONE TABLET BY MOUTH DAILY   canagliflozin 300 MG Tabs tablet Commonly known as:  INVOKANA TAKE 1 TABLET BY MOUTH  DAILY BEFORE BREAKFAST   docusate sodium 100 MG capsule Commonly known as:  COLACE Take 100 mg by mouth daily.   famotidine 20 MG tablet Commonly known as:  PEPCID Take 1 tablet (20 mg total) by mouth at bedtime.   fenofibrate 160 MG tablet Take 1 tablet (160 mg total) daily by mouth.   glucose blood test strip Commonly known as:  ONE TOUCH ULTRA TEST Use to check blood sugar 3 times per day   levothyroxine 175 MCG tablet Commonly known as:  SYNTHROID, LEVOTHROID TAKE 1 TABLET BY MOUTH EVERY DAY   lisinopril 5 MG tablet Commonly known as:  PRINIVIL,ZESTRIL Take 1 tablet (5 mg total) by mouth daily.   Lorcaserin HCl ER 20 MG Tb24 Commonly known as:  BELVIQ XR Take 1 tablet by mouth daily with  breakfast.   metFORMIN 1000 MG tablet Commonly known as:  GLUCOPHAGE TAKE ONE TABLET BY MOUTH TWICE DAILY WITH MEALS   metoprolol succinate 25 MG 24 hr tablet Commonly known as:  TOPROL-XL Take 1 tablet (25 mg total) by mouth daily.   multivitamin tablet Take 1 tablet by mouth daily.   omega-3 acid ethyl esters 1 g capsule Commonly known as:  LOVAZA TAKE 2 CAPSULES BY MOUTH TWICE DAILY   ONETOUCH DELICA LANCETS 54O Misc Use to check blood sugar 3 times per day.   rivaroxaban 20 MG Tabs tablet Commonly known as:  XARELTO Take 1 tablet (20 mg total) by mouth daily with supper.   TRULICITY 1.5 EV/0.3JK Sopn Generic drug:  Dulaglutide INJECT THE CONTENTS OF 1 PEN UNDER THE SKIN ONCE WEEKLY AS DIRECTED   VITAMIN C PO Take 2 tablets by mouth 2 (two) times daily.   Vitamin D (Ergocalciferol) 50000 units Caps capsule Commonly known as:  DRISDOL Take 1 capsule (50,000 Units total) by mouth every 7 (seven) days.       Allergies:  No Known Allergies  Past Medical History:  Diagnosis Date  . Anxiety   . Atrial flutter ([place])    typical appearing; s/p ablation 12-2012  . Constipation   . Depression    used to see psych, on no meds as of 03/2009  . Diabetes mellitus    TYPE 2  . Fatty liver   . GERD (gastroesophageal reflux disease)   . Gout   . Hyperlipidemia   . Hypothyroidism   . Leg edema   . Obesity   . Persistent atrial fibrillation ([place])    PVI 12/2012  . Prediabetes   . Rhinitis    vasomotor  . S/P Minimally invasive maze operation for atrial fibrillation 03/12/2016   Complete bilateral atrial lesion set using cryothermy and bipolar radiofrequency ablation with clipping of LA appendage via right mini thoracotomy approach  . S/P patent foramen ovale closure 03/12/2016  . Sleep apnea    mild, now treated with CPAP since ~9-12    Past Surgical History:  Procedure Laterality Date  . ABLATION OF DYSRHYTHMIC FOCUS  01/05/2013   PVI and flutter ablation by Dr  Rayann Heman  . ATRIAL FIBRILLATION ABLATION N/A 01/05/2013   Procedure: ATRIAL FIBRILLATION ABLATION;  Surgeon: Ganoe Grayer, MD;  Location: [facility] CATH LAB;  Service: Cardiovascular;  Laterality: N/A;  . CARDIAC CATHETERIZATION N/A  01/12/2016   Procedure: Right/Left Heart Cath and Coronary Angiography;  Surgeon: [name], MD;  Location: [place] CV LAB;  Service: Cardiovascular;  Laterality: N/A;  . CARDIOVERSION  03/01/11  . CLIPPING OF ATRIAL APPENDAGE Left 03/12/2016   Procedure: CLIPPING OF ATRIAL APPENDAGE;  Surgeon: [name], MD;  Location: [place];  Service: Open Heart Surgery;  Laterality: Left;  . MINIMALLY INVASIVE MAZE PROCEDURE N/A 03/12/2016   Procedure: MINIMALLY INVASIVE MAZE PROCEDURE;  Surgeon: [name], MD;  Location: [place];  Service: Open Heart Surgery;  Laterality: N/A;  . NASAL SINUS SURGERY    . TEE WITHOUT CARDIOVERSION N/A 01/04/2013   Procedure: TRANSESOPHAGEAL ECHOCARDIOGRAM (TEE);  Surgeon: [name], MD;  Location: [facility] ENDOSCOPY;  Service: Cardiovascular;  Laterality: N/A;  . TEE WITHOUT CARDIOVERSION N/A 03/12/2016   Procedure: TRANSESOPHAGEAL ECHOCARDIOGRAM (TEE);  Surgeon: [name], MD;  Location: [place];  Service: Open Heart Surgery;  Laterality: N/A;  . TONSILLECTOMY    . VASECTOMY      Family History  Problem Relation Age of Onset  . Asthma Mother   . Breast cancer Mother   . Dementia Mother   . Diabetes Father   . Hyperlipidemia Father   . Obesity Father   . Atrial fibrillation Father   . Hypertension Brother   . Melanoma Brother   . Allergies Daughter   . Throat cancer Maternal Uncle   . Colon cancer Paternal Grandfather   . Prostate cancer Neg Hx     Social History:  reports that he has never smoked. He has never used smokeless tobacco. He reports that he does not drink alcohol or use drugs.    Review of Systems         Lipids:  has high triglycerides mostly previously as high as 681; LDL previously also consistently  high.   On Fenofibrate and taking Lipitor 10 mg daily  His LDL is well controlled but his triglycerides are slightly higher again, previously normal        Lab Results  Component Value Date   CHOL 164 12/07/2017   HDL 45 12/07/2017   LDLCALC 83 12/07/2017   TRIG 180 (H) 12/07/2017   CHOLHDL 3.6 12/07/2017                   Thyroid: He was diagnosed to have hypothyroidism in 1994  He is compliant with his Synthroid and  has been on generic levothyroxine  Previously had been on 200 mcg dosage  Has been on levothyroxine 175 mcg daily now his TSH is consistently normal He feels well  Lab Results  Component Value Date   TSH 0.500 12/07/2017   TSH 1.150 08/05/2017   TSH 0.417 (L) 05/10/2017   FREET4 1.85 (H) 08/05/2017   FREET4 1.93 (H) 02/10/2017   FREET4 1.78 (H) 06/14/2016       The blood pressure has been Treated with low-dose lisinopril and metoprolol   BP Readings from Last 3 Encounters:  12/15/17 122/76  10/21/17 114/68  08/18/17 100/67   Has been prescribed vitamin D weekly   Physical Examination:  BP 122/76 (BP Location: Left Arm, Patient Position: Sitting, Cuff Size: Normal)   Pulse 86   Ht 6\' 1"  (1.854 m)   Wt 232 lb (105.2 kg)   SpO2 97%   BMI 30.61 kg/m   Diabetic Foot Exam - Simple   Simple Foot Form Diabetic Foot exam was performed with the following findings:  Yes  Visual Inspection No deformities, no ulcerations, no other skin breakdown bilaterally:  Yes Sensation Testing Intact to touch and monofilament testing bilaterally:  Yes Pulse Check Posterior Tibialis and Dorsalis pulse intact bilaterally:  Yes Comments      ASSESSMENT:  Diabetes type 2, with obesity  He is on a 3 drug regimen of metformin, Trulicity 1.5 mg and Invokana 300 mg  See history of present illness for discussion of his current management, blood sugar patterns and problems identified.  His A1c is relatively low compared to his blood sugars at home which are  averaging about 131 Although he is taking Trulicity he has difficulty watching his diet and being consistent recently Weight has gone back up and he is not able to follow-up with the weight management program  Recommendations:  He will benefit from a weight loss medication which will help him be consistent with diet  Since he had not benefited from Wellbutrin previously he will start Belviq XR 20 mg daily and discussed how this works as well as benefits and long-term use, study showing long-term benefits including with diabetes control; may need to do prior authorization for this; current BMI over 30 with comorbidities  Start improving portions and snacks  Regular exercise as able to  More frequent glucose monitoring including after meals  He may benefit from using Ozempic instead of Trulicity but this is not covered   HYPERTRIGLYCERIDEMIA: Not as well controlled with his weight going up and diet not be optimal Discussed that these should improve with weight loss and diet, to continue fenofibrate and again recheck in 3 months to make sure it is stable  HYPOTHYROIDISM: TSH still normal He will continue the same dose    PLAN:  As above, follow-up in 3 months  Counseling time on subjects discussed in assessment and plan sections is over 50% of today's 25 minute visit   Elayne Snare 12/15/2017, 11:02 AM   Note: This office note was prepared with Dragon voice recognition system technology. Any transcriptional errors that result from this process are unintentional.

## 2017-12-14 NOTE — Telephone Encounter (Signed)
Does the patient still need this rx, or has the course been complete?

## 2017-12-15 ENCOUNTER — Encounter: Payer: Self-pay | Admitting: Endocrinology

## 2017-12-15 ENCOUNTER — Ambulatory Visit: Payer: 59 | Admitting: Endocrinology

## 2017-12-15 VITALS — BP 122/76 | HR 86 | Ht 73.0 in | Wt 232.0 lb

## 2017-12-15 DIAGNOSIS — E782 Mixed hyperlipidemia: Secondary | ICD-10-CM | POA: Diagnosis not present

## 2017-12-15 DIAGNOSIS — E1165 Type 2 diabetes mellitus with hyperglycemia: Secondary | ICD-10-CM | POA: Diagnosis not present

## 2017-12-15 DIAGNOSIS — E063 Autoimmune thyroiditis: Secondary | ICD-10-CM | POA: Diagnosis not present

## 2017-12-15 DIAGNOSIS — E669 Obesity, unspecified: Secondary | ICD-10-CM | POA: Diagnosis not present

## 2017-12-15 MED ORDER — LORCASERIN HCL ER 20 MG PO TB24: 1.0000 | ORAL_TABLET | Freq: Every day | ORAL | 2 refills | Status: DC

## 2017-12-19 ENCOUNTER — Other Ambulatory Visit: Payer: Self-pay | Admitting: Endocrinology

## 2017-12-19 DIAGNOSIS — E559 Vitamin D deficiency, unspecified: Secondary | ICD-10-CM

## 2017-12-20 MED ORDER — DULAGLUTIDE 1.5 MG/0.5ML ~~LOC~~ SOAJ: SUBCUTANEOUS | 2 refills | Status: DC

## 2017-12-20 MED ORDER — LEVOTHYROXINE SODIUM 175 MCG PO TABS: 175.0000 ug | ORAL_TABLET | Freq: Every day | ORAL | 0 refills | Status: DC

## 2017-12-20 MED ORDER — VITAMIN D (ERGOCALCIFEROL) 1.25 MG (50000 UNIT) PO CAPS: 50000.0000 [IU] | ORAL_CAPSULE | ORAL | 0 refills | Status: DC

## 2017-12-20 MED ORDER — ATORVASTATIN CALCIUM 10 MG PO TABS: 10.0000 mg | ORAL_TABLET | Freq: Every day | ORAL | 0 refills | Status: DC

## 2017-12-20 MED ORDER — OMEGA-3-ACID ETHYL ESTERS 1 G PO CAPS: ORAL_CAPSULE | ORAL | 0 refills | Status: DC

## 2017-12-21 ENCOUNTER — Telehealth: Payer: Self-pay

## 2017-12-21 NOTE — Telephone Encounter (Signed)
PA submitted for Belviq XR 20mg  tablets, take 1 tab by mouth PO QD before breakfast via [email or web address]. SUG:[id number] KT:[id number] RX #: [id number]

## 2017-12-22 ENCOUNTER — Telehealth: Payer: Self-pay

## 2017-12-22 NOTE — Telephone Encounter (Signed)
Received fax from [facility] stating that pt was approved for Belviq XR20mg  tablets through 06/23/2018.

## 2017-12-22 NOTE — Telephone Encounter (Signed)
Error

## 2017-12-29 ENCOUNTER — Encounter: Payer: Self-pay | Admitting: Endocrinology

## 2017-12-30 ENCOUNTER — Other Ambulatory Visit: Payer: Self-pay

## 2017-12-30 MED ORDER — NALTREXONE-BUPROPION HCL ER 8-90 MG PO TB12: ORAL_TABLET | ORAL | 3 refills | Status: DC

## 2017-12-30 MED ORDER — GLUCOSE BLOOD VI STRP: ORAL_STRIP | 3 refills | Status: DC

## 2017-12-30 MED ORDER — ONETOUCH LANCETS MISC: 3 refills | Status: DC

## 2018-01-03 ENCOUNTER — Other Ambulatory Visit: Payer: Self-pay | Admitting: Endocrinology

## 2018-01-12 ENCOUNTER — Other Ambulatory Visit: Payer: Self-pay | Admitting: Endocrinology

## 2018-01-12 DIAGNOSIS — E559 Vitamin D deficiency, unspecified: Secondary | ICD-10-CM

## 2018-01-17 ENCOUNTER — Other Ambulatory Visit: Payer: Self-pay | Admitting: Endocrinology

## 2018-01-21 ENCOUNTER — Other Ambulatory Visit: Payer: Self-pay | Admitting: Endocrinology

## 2018-03-04 DIAGNOSIS — Z23 Encounter for immunization: Secondary | ICD-10-CM | POA: Diagnosis not present

## 2018-03-09 ENCOUNTER — Telehealth: Payer: Self-pay | Admitting: Endocrinology

## 2018-03-09 IMAGING — CR DG CHEST 2V
2 series · 2 of 2 positions shown · non-contrast
Comparison: 06/03/2014 and prior radiographs

CLINICAL DATA: Preoperative chest radiograph prior to Maze
procedure.

EXAM:
CHEST  2 VIEW

[w chest pa]
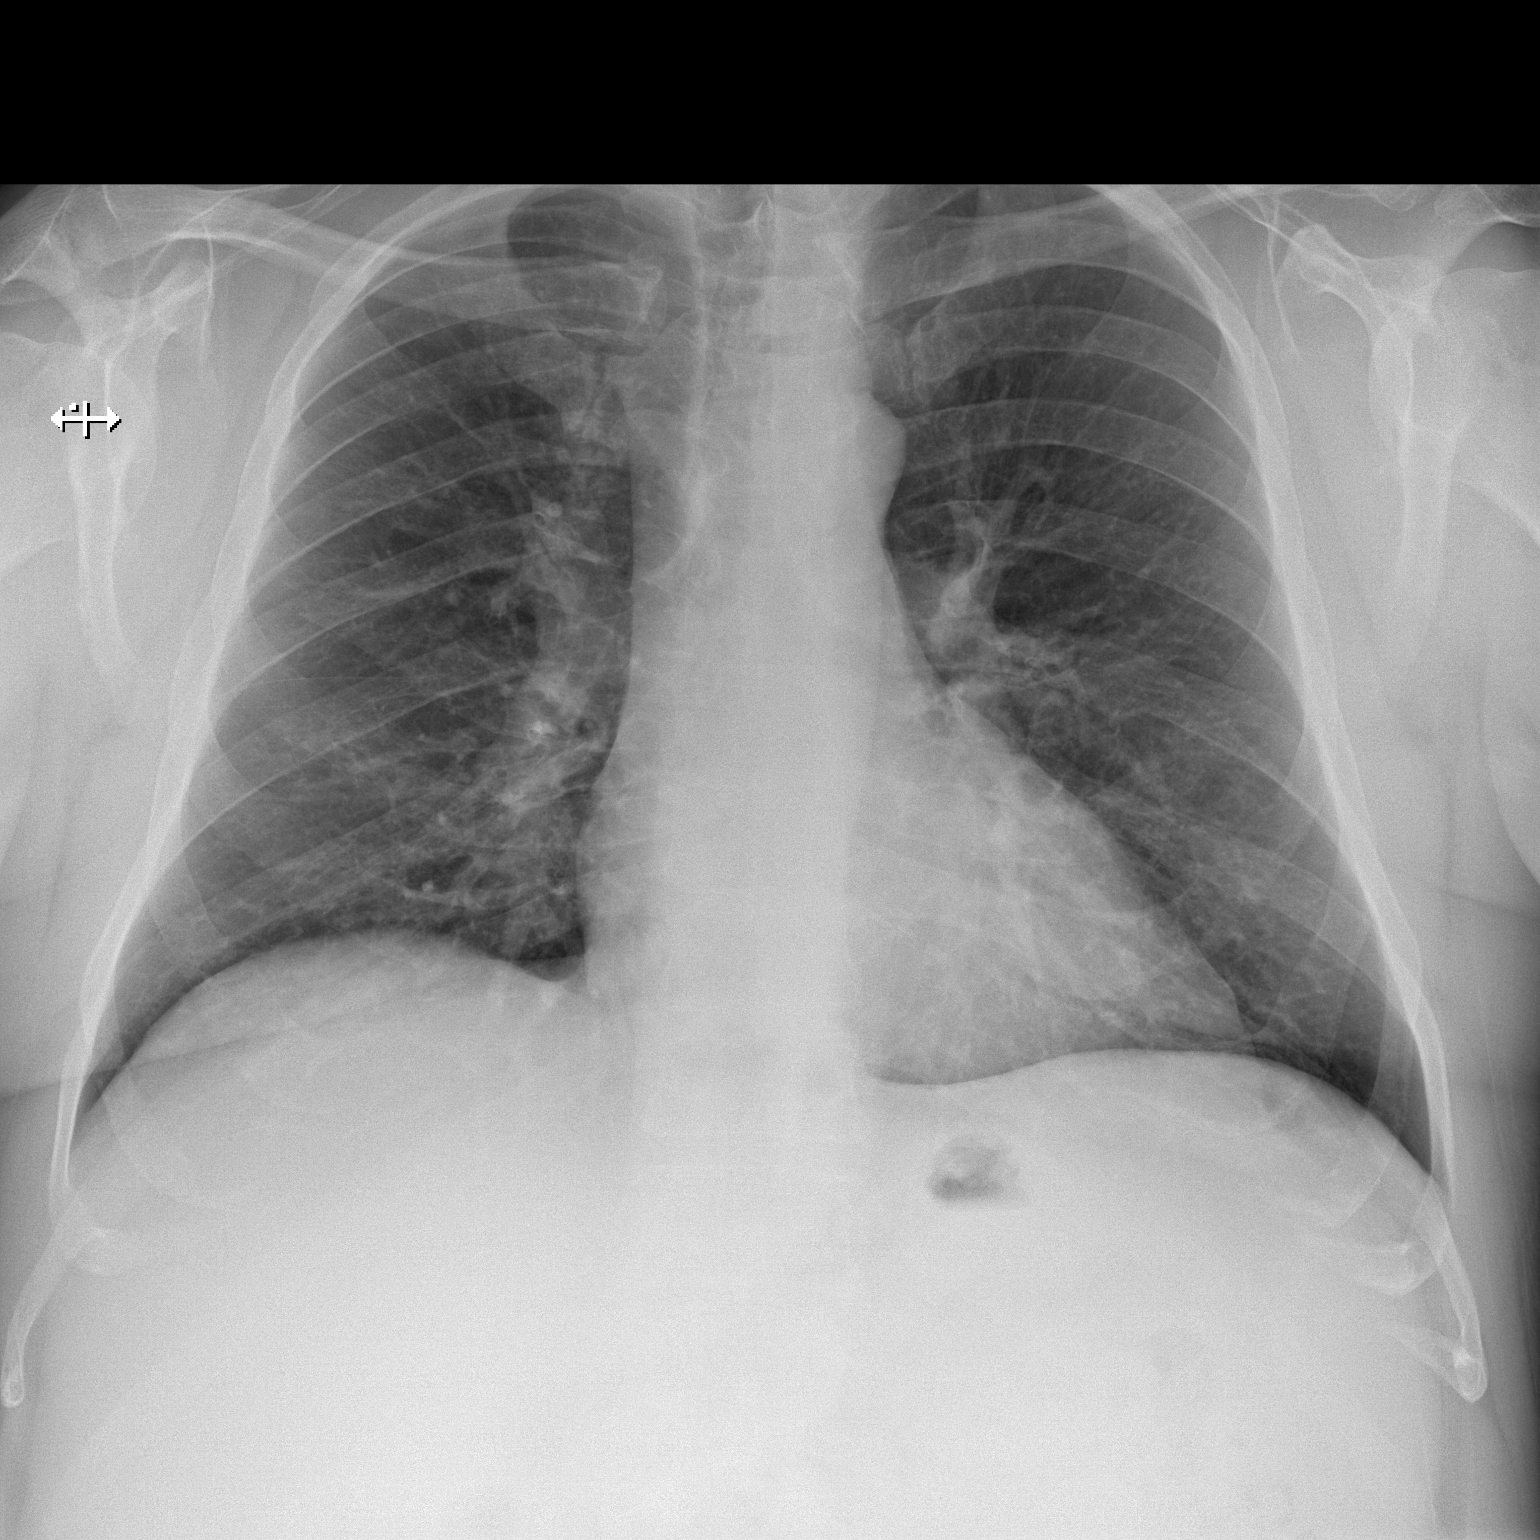

[w chest lat]
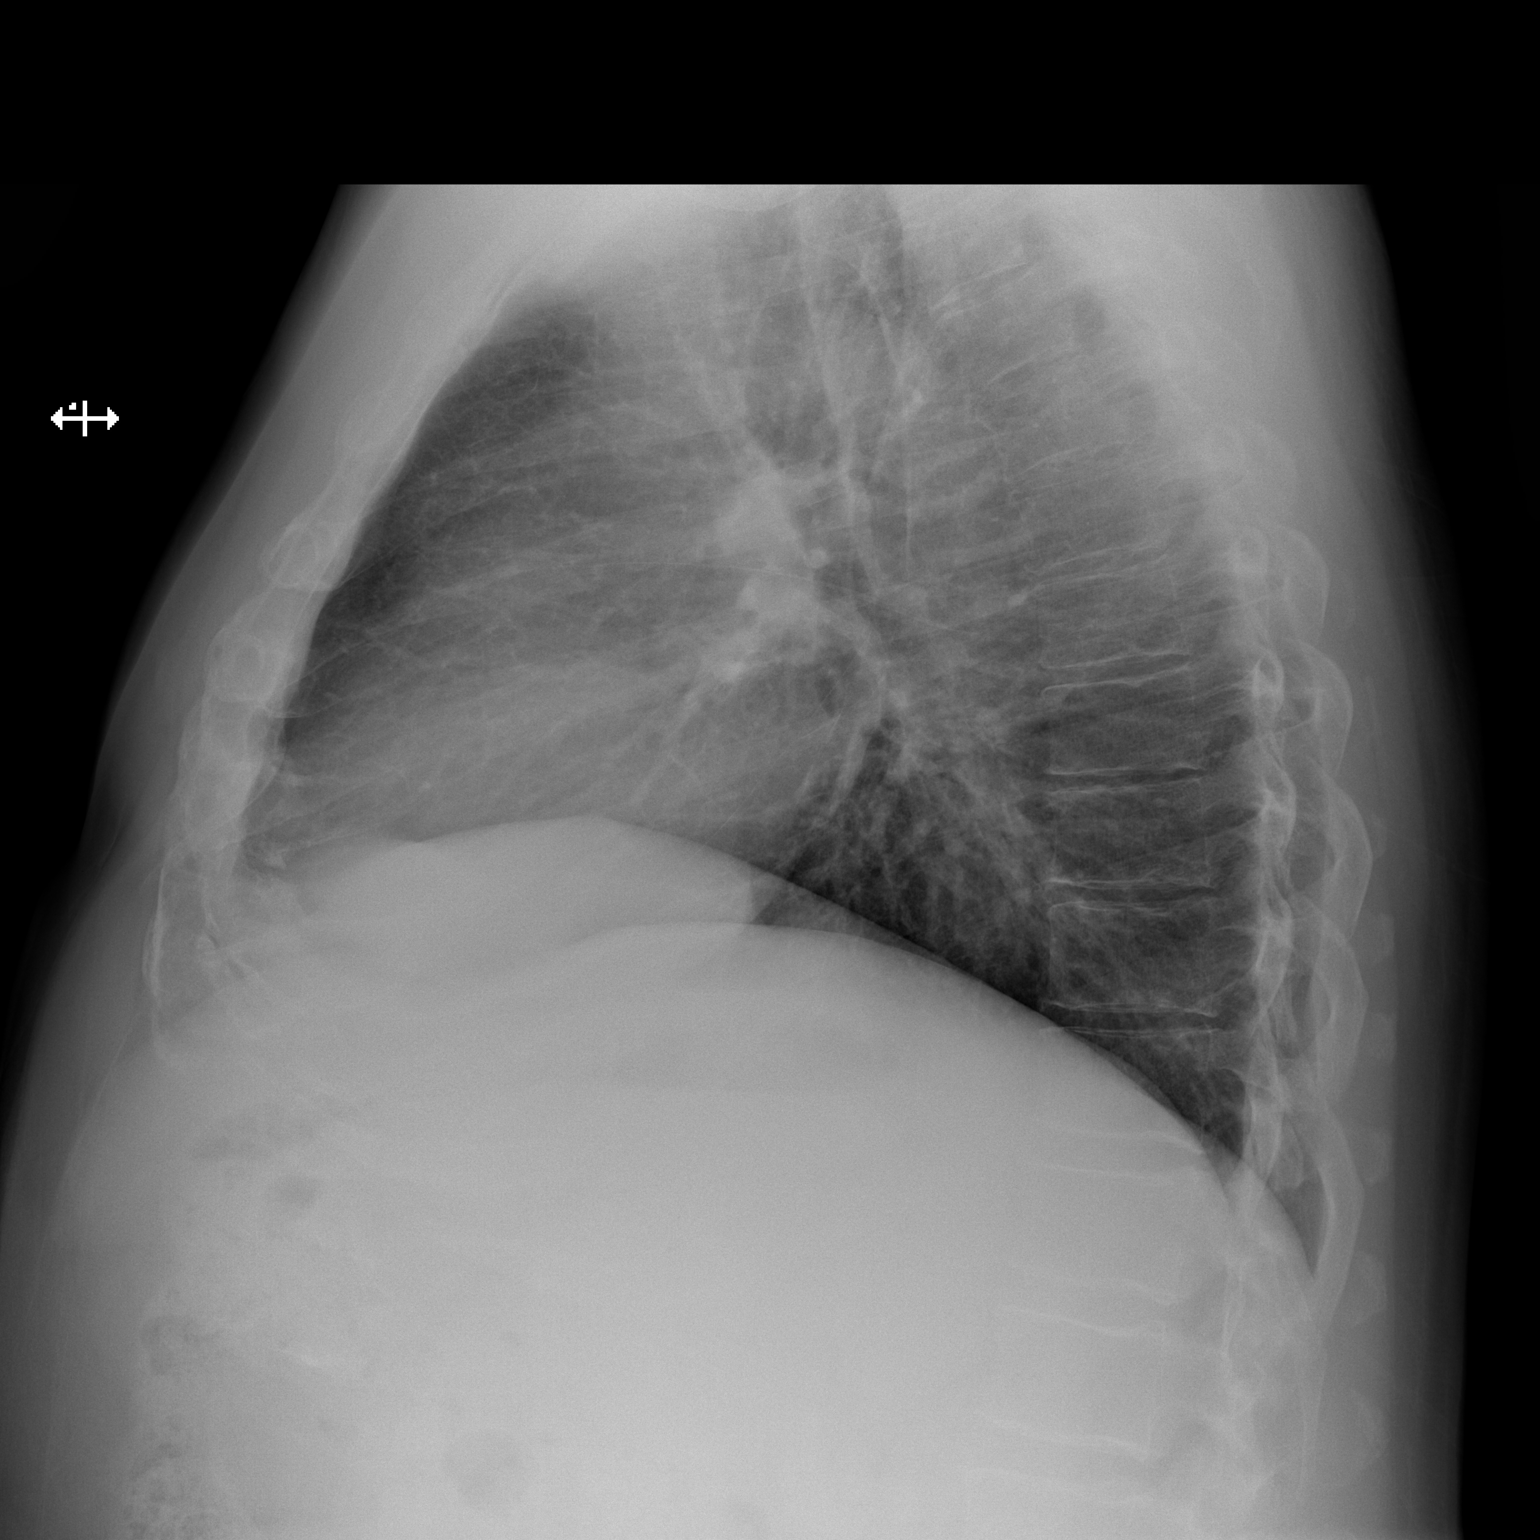

[2 of 2 positions shown; findings below may reference images not displayed]

FINDINGS: Upper limits normal heart size noted.

There is no evidence of focal airspace disease, pulmonary edema,
suspicious pulmonary nodule/mass, pleural effusion, or pneumothorax.
No acute bony abnormalities are identified.
IMPRESSION: Upper limits normal heart size without other significant
abnormality.

## 2018-03-09 NOTE — Telephone Encounter (Signed)
Need 2 new prescriptions for test strips and Lancets because it need to be 90 days. Please advise

## 2018-03-10 ENCOUNTER — Other Ambulatory Visit: Payer: Self-pay | Admitting: Endocrinology

## 2018-03-10 DIAGNOSIS — E1165 Type 2 diabetes mellitus with hyperglycemia: Secondary | ICD-10-CM

## 2018-03-10 IMAGING — CR DG CHEST 1V PORT
1 series · 1 of 1 positions shown · non-contrast
Comparison: March 11, 2016

CLINICAL DATA: Hypoxia

EXAM:
PORTABLE CHEST 1 VIEW

[AP]
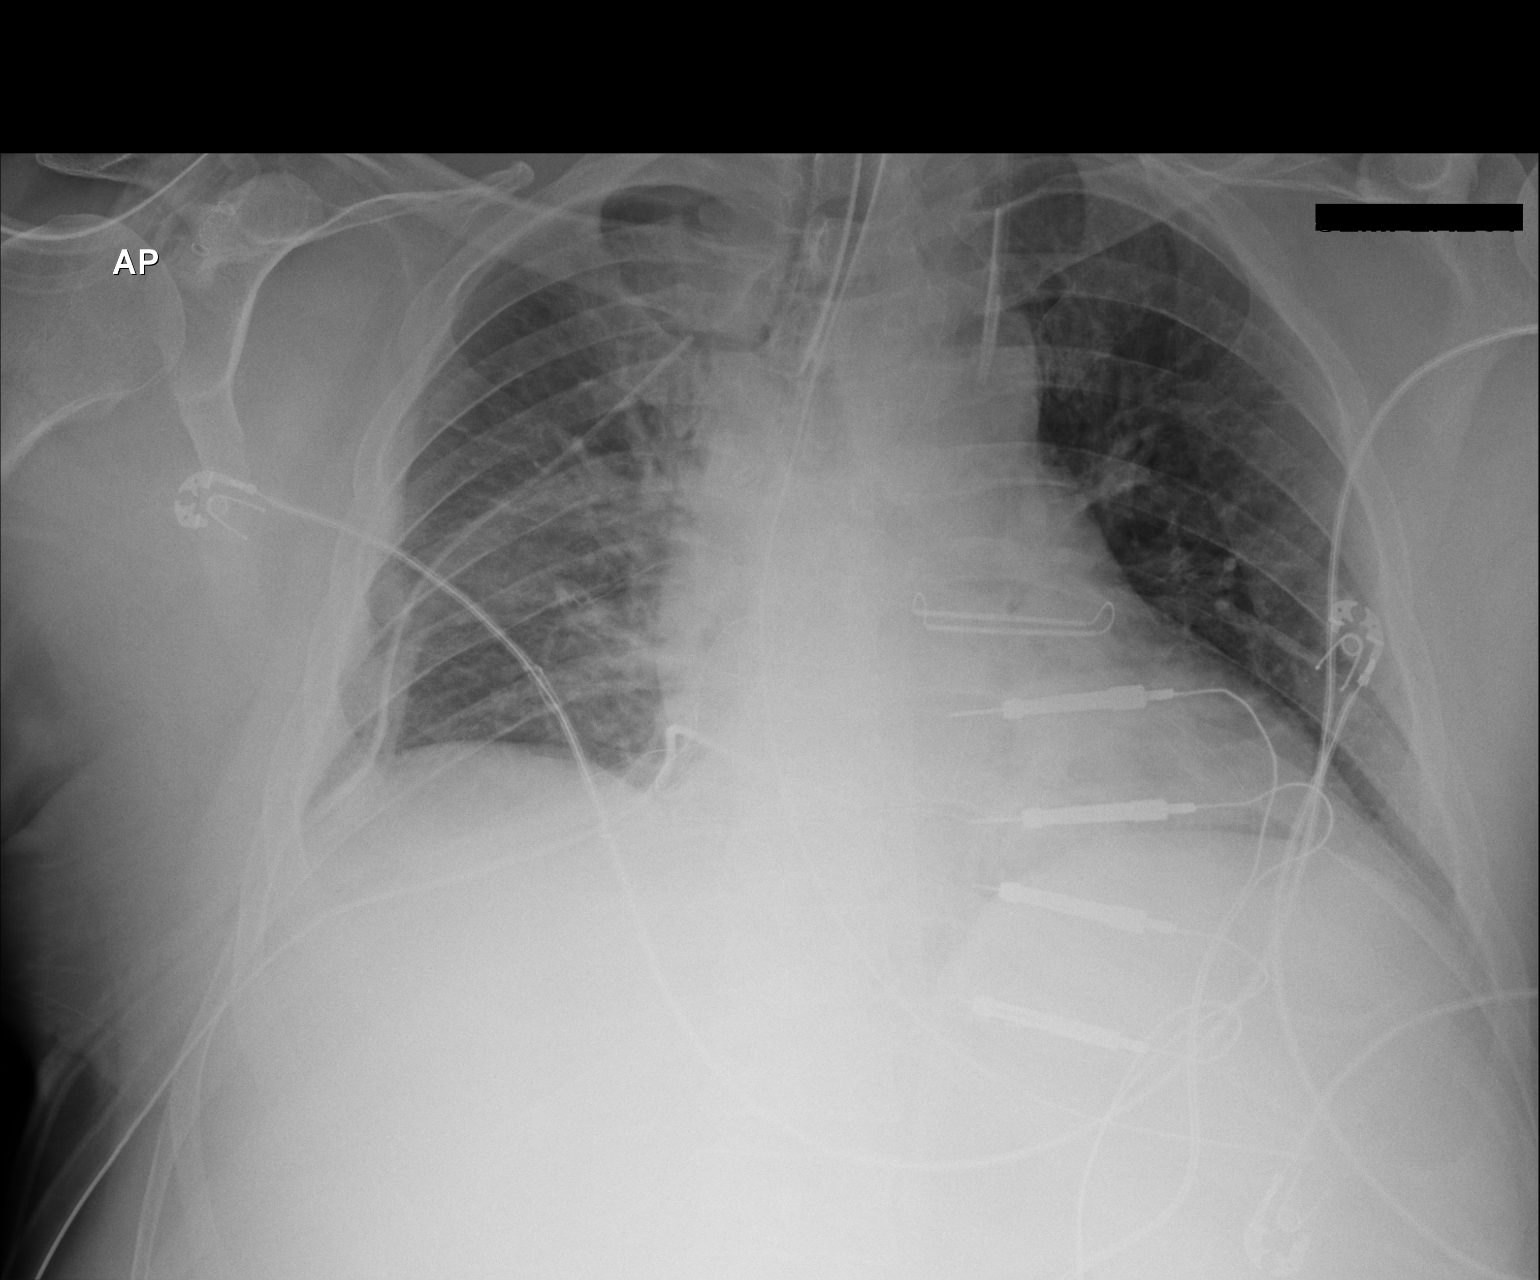

[1 of 1 positions shown; findings below may reference images not displayed]

FINDINGS: Endotracheal tube tip is 2.9 cm above the carina. Left jugular
catheter tip is at the junction of the left jugular and left
subclavian veins. Nasogastric tube tip and side port are in the
stomach. There is a chest tube on the right. There are temporary
pacemaker leads attached to the right heart. No evident
pneumothorax. There is atelectatic change in the left lower lobe.
There is also mild perihilar edema on the right. The lungs elsewhere
clear. Heart is borderline prominent with pulmonary vascularity
within normal limits. There is a left atrial appendage clamp. No
adenopathy. No bone lesions.
IMPRESSION: Tube and catheter positions as described without pneumothorax. Mild
perihilar edema on the right. Atelectasis left base. Left atrial
appendage clamp on left. No adenopathy evident.

## 2018-03-10 MED ORDER — GLUCOSE BLOOD VI STRP: ORAL_STRIP | 3 refills | Status: DC

## 2018-03-10 MED ORDER — ONETOUCH LANCETS MISC
3 refills | Status: DC
Start: 2018-03-10 — End: 2018-06-09

## 2018-03-10 NOTE — Telephone Encounter (Signed)
Rx lancet and test strip sent to the pharmacy.

## 2018-03-11 IMAGING — DX DG CHEST 1V PORT
1 series · 1 of 1 positions shown · non-contrast
Comparison: March 12, 2016

CLINICAL DATA: Atelectasis.

EXAM:
PORTABLE CHEST 1 VIEW

[chest ap]
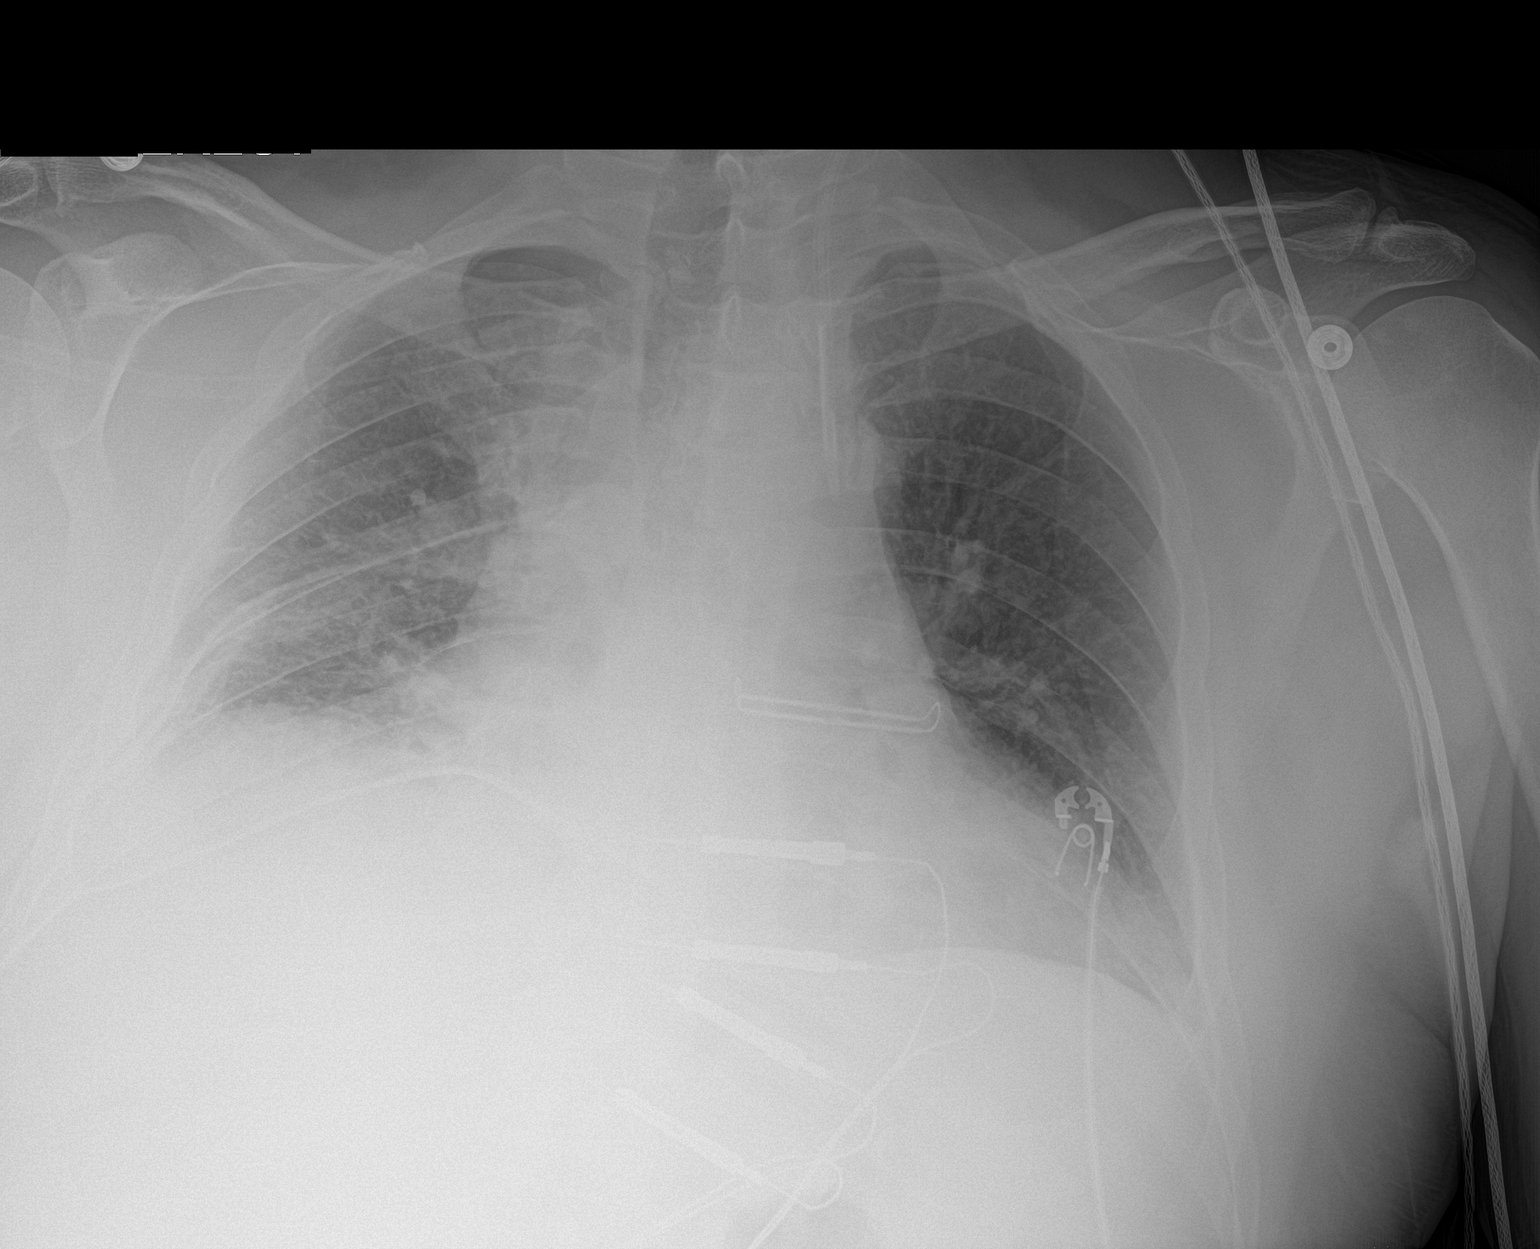

[1 of 1 positions shown; findings below may reference images not displayed]

FINDINGS: The right chest tube has been repositioned, with the distal tip now
projected over the right hilum. There is a small right apical
pneumothorax measuring 15 mm which was not seen yesterday. No
left-sided pneumothorax. Stable left IJ. Mild atelectasis remains in
the right base. The cardiomediastinal silhouette and lungs are
otherwise unchanged. ET and NG tubes have been removed.
IMPRESSION: The right chest tube has been slightly repositioned and there is now
a small right apical pneumothorax measuring 15 mm.

Mild right basilar atelectasis.

These results will be called to the ordering clinician or
representative by the Radiologist Assistant, and communication
documented in the PACS or zVision Dashboard.

## 2018-03-12 IMAGING — CR DG CHEST 1V PORT
1 series · 1 of 1 positions shown · non-contrast
Comparison: March 13, 2016

CLINICAL DATA: Shortness of breath.

EXAM:
PORTABLE CHEST 1 VIEW

[AP]
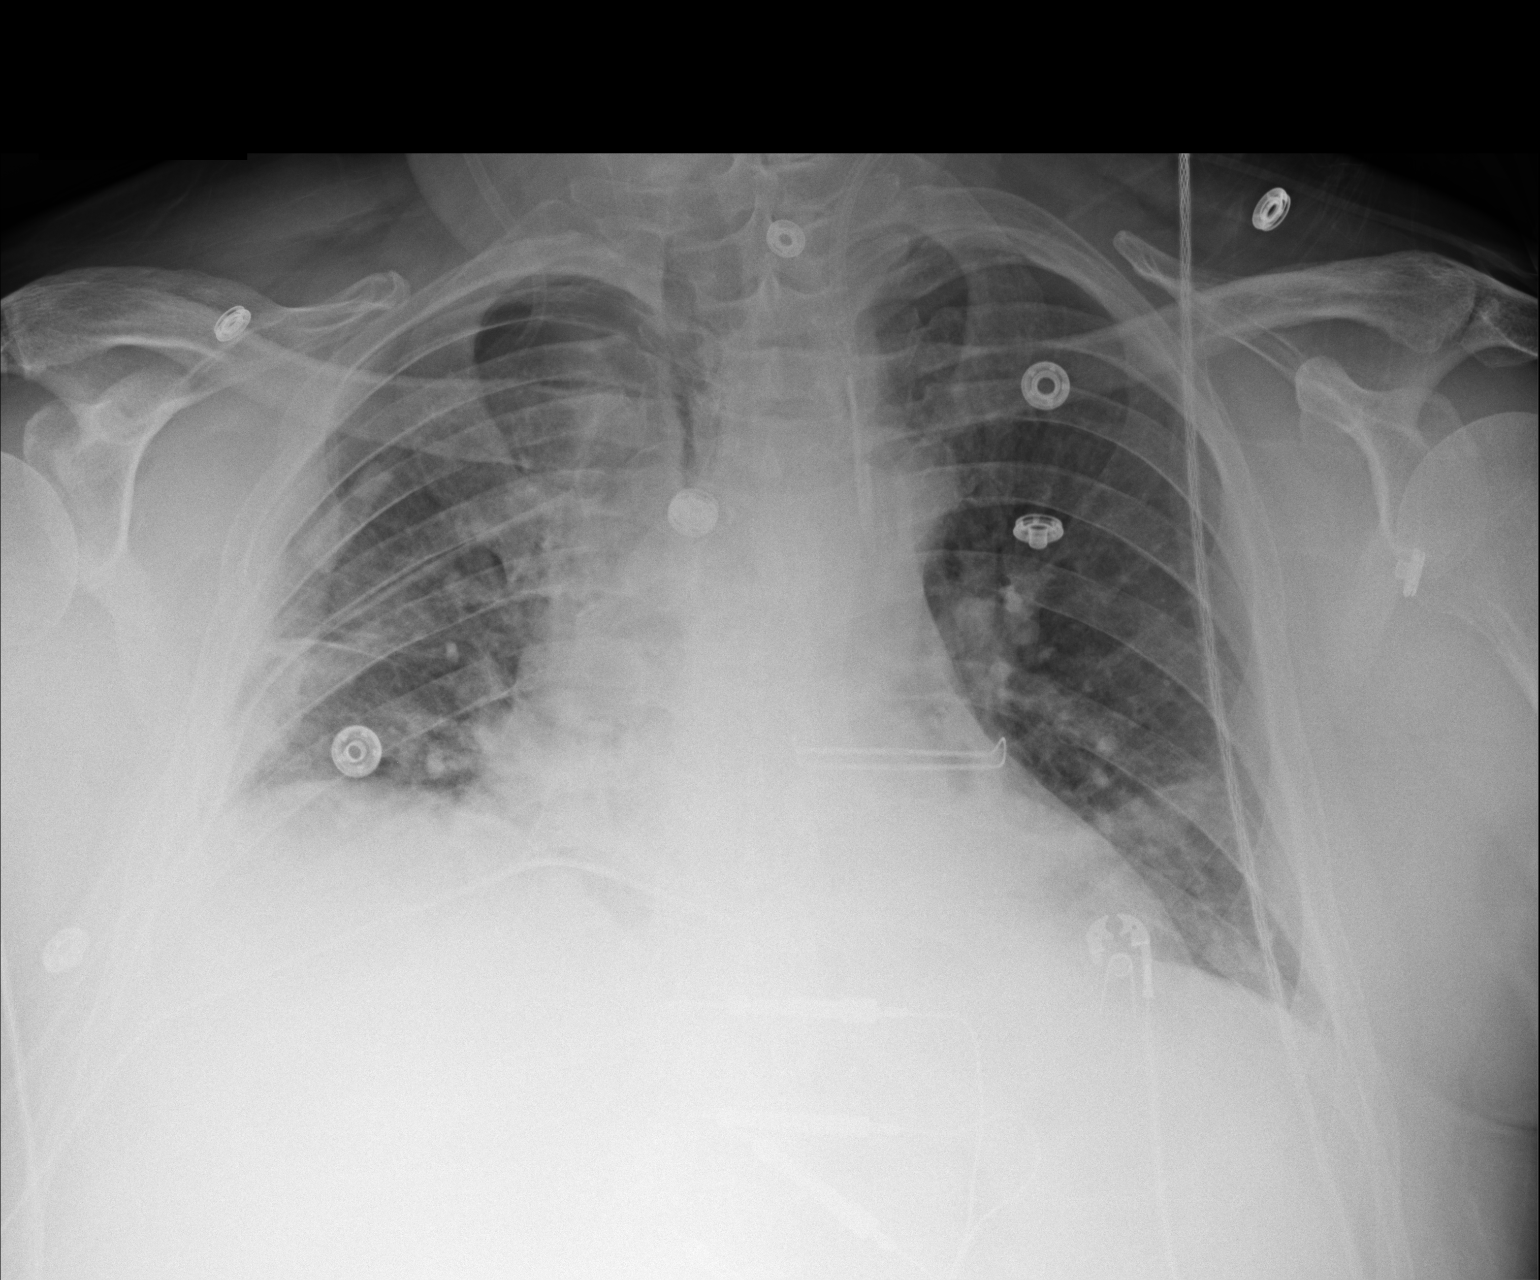

[1 of 1 positions shown; findings below may reference images not displayed]

FINDINGS: The right chest tube is stable. A small right apical pneumothorax
likely remains. Mild atelectasis in the bases. Suspected mild
asymmetric edema. Stable cardiomegaly. No change in the
cardiomediastinal silhouette.
IMPRESSION: 1. The right chest tube remains in place. There is probably a tiny
right apical pneumothorax remaining. Mild edema.

## 2018-03-17 ENCOUNTER — Encounter: Payer: Self-pay | Admitting: Endocrinology

## 2018-03-17 ENCOUNTER — Telehealth: Payer: Self-pay | Admitting: Internal Medicine

## 2018-03-17 ENCOUNTER — Ambulatory Visit: Payer: 59 | Admitting: Endocrinology

## 2018-03-17 VITALS — BP 130/84 | HR 76 | Ht 75.0 in | Wt 232.0 lb

## 2018-03-17 DIAGNOSIS — E559 Vitamin D deficiency, unspecified: Secondary | ICD-10-CM | POA: Diagnosis not present

## 2018-03-17 DIAGNOSIS — R5383 Other fatigue: Secondary | ICD-10-CM

## 2018-03-17 DIAGNOSIS — E790 Hyperuricemia without signs of inflammatory arthritis and tophaceous disease: Secondary | ICD-10-CM | POA: Diagnosis not present

## 2018-03-17 DIAGNOSIS — E1165 Type 2 diabetes mellitus with hyperglycemia: Secondary | ICD-10-CM

## 2018-03-17 DIAGNOSIS — E063 Autoimmune thyroiditis: Secondary | ICD-10-CM

## 2018-03-17 DIAGNOSIS — E782 Mixed hyperlipidemia: Secondary | ICD-10-CM

## 2018-03-17 LAB — POCT GLYCOSYLATED HEMOGLOBIN (HGB A1C): HEMOGLOBIN A1C: 5.9 % — AB (ref 4.0–5.6)

## 2018-03-17 MED ORDER — FREESTYLE LIBRE 14 DAY SENSOR MISC: 1.0000 [IU] | 4 refills | Status: DC

## 2018-03-17 MED ORDER — FREESTYLE LIBRE 14 DAY READER DEVI: 1.0000 | Freq: Once | 0 refills | Status: AC

## 2018-03-17 NOTE — Progress Notes (Addendum)
Patient ID: Colin Wood, male   DOB: 29-Jan-1961, 57 y.o.   MRN: [id number]           Reason for Appointment: Follow-up for Type 2 Diabetes  Referring physician: Larose Kells  History of Present Illness:          Diagnosis: Type 2 diabetes mellitus, date of diagnosis: ?  2011        Past history: He was not having any symptoms at the time of diagnosis and not clear what his initial blood sugars were He was started on metformin initially and had fairly good control Subsequently with higher sugars he was given Amaryl also in addition which has been continued.  A1c was 10.6% in 2012 Subsequently in 2014 his A1c was down to 6.5 but he had a regular follow-up subsequently Because of an A1c of 8.1 in 02/2014 he was given Tradjenta in addition to the above drugs but this was not effective  He was referred here because of a high A1c of 7.9 in 07/2014  Recent history:   Oral hypoglycemic drugs the patient is taking are: metformin 1 g twice a day, Trulicity 1.5 mg weekly, Invokana 300 mg daily      A1c has been excellent and now 5.9 and generally around 6%  Current management, blood sugar values and problems identified:  He was advised to try Belviq on his last visit for helping him with weight loss and appetite control but he did not find this useful and he did not continue that  Also as before he is not able to consistently comply with his diet and may not control portions, carbohydrates and certain higher fat meals including in the morning  His blood sugars have been checked only in the morning and has a couple of readings nonfasting only  Highest blood sugar after breakfast was 191  His weight is about the same  Also has not done any regular exercise except some work around the house and yard work  No side effects from [employer] or Trulicity         Side effects from medications have been:None  Compliance with the medical regimen: Fair   Glucose monitoring:  done less than once a day        Glucometer:   One Touch.      Blood Glucose readings by review of monitor download:   Fasting range 110-139 Nonfasting 117-191, highest after breakfast  MEDIAN 128  Self-care: The diet that the patient has been following is: None, he has difficulty controlling portions and carbohydrates at times    Breakfast is frequently an egg and meat English muffin at a fast food restaurant Will snack on apples and yogurt           Exercise: Minimal programmed exercise           Dietician visit, most recent: none, previously had gone to a class          Weight history: Previous range 260-280  Wt Readings from Last 3 Encounters:  03/17/18 232 lb (105.2 kg)  12/15/17 232 lb (105.2 kg)  10/21/17 230 lb (104.3 kg)    Glycemic control:   Lab Results  Component Value Date   HGBA1C 5.9 (A) 03/17/2018   HGBA1C 6.2 (H) 12/07/2017   HGBA1C 6.0 (H) 08/05/2017   Lab Results  Component Value Date   LDLCALC 83 12/07/2017   CREATININE 1.15 12/07/2017      Allergies as of 03/17/2018   No Known  Allergies     Medication List        Accurate as of 03/17/18  8:23 AM. Always use your most recent med list.          allopurinol 300 MG tablet Commonly known as:  ZYLOPRIM Take 1 tablet (300 mg total) daily by mouth.   atorvastatin 10 MG tablet Commonly known as:  LIPITOR Take 1 tablet (10 mg total) by mouth daily.   atorvastatin 10 MG tablet Commonly known as:  LIPITOR TAKE ONE TABLET BY MOUTH DAILY   canagliflozin 300 MG Tabs tablet Commonly known as:  INVOKANA TAKE 1 TABLET BY MOUTH  DAILY BEFORE BREAKFAST   docusate sodium 100 MG capsule Commonly known as:  COLACE Take 100 mg by mouth daily.   Dulaglutide 1.5 MG/0.5ML Sopn Use one pen weekly   TRULICITY 1.5 ZO/1.0RU Sopn Generic drug:  Dulaglutide INJECT THE CONTENTS OF 1 PEN UNDER THE SKIN ONCE WEEKLY AS DIRECTED   famotidine 20 MG tablet Commonly known as:  PEPCID Take 1 tablet (20 mg total) by mouth at  bedtime.   fenofibrate 160 MG tablet Take 1 tablet (160 mg total) daily by mouth.   glucose blood test strip Use as instructed to check blood sugar 3 times daily.   levothyroxine 175 MCG tablet Commonly known as:  SYNTHROID, LEVOTHROID Take 1 tablet (175 mcg total) by mouth daily.   levothyroxine 175 MCG tablet Commonly known as:  SYNTHROID, LEVOTHROID TAKE 1 TABLET BY MOUTH EVERY DAY   lisinopril 5 MG tablet Commonly known as:  PRINIVIL,ZESTRIL Take 1 tablet (5 mg total) by mouth daily.   metFORMIN 1000 MG tablet Commonly known as:  GLUCOPHAGE TAKE ONE TABLET BY MOUTH TWICE DAILY WITH MEALS   metoprolol succinate 25 MG 24 hr tablet Commonly known as:  TOPROL-XL Take 1 tablet (25 mg total) by mouth daily.   multivitamin tablet Take 1 tablet by mouth daily.   Naltrexone-buPROPion HCl ER 8-90 MG Tb12 TAKE 1 TABLET BY MOUTH FOR 7 DAYS AND THEN 2 TABLET BY MOUTH DAILY.   omega-3 acid ethyl esters 1 g capsule Commonly known as:  LOVAZA TAKE 2 CAPSULES BY MOUTH TWICE DAILY   ONE TOUCH LANCETS Misc Use lancets to check blood sugar 3 times daily.   rivaroxaban 20 MG Tabs tablet Commonly known as:  XARELTO Take 1 tablet (20 mg total) by mouth daily with supper.   VITAMIN C PO Take 2 tablets by mouth 2 (two) times daily.   Vitamin D (Ergocalciferol) 50000 units Caps capsule Commonly known as:  DRISDOL Take 1 capsule (50,000 Units total) by mouth every 7 (seven) days.   Vitamin D (Ergocalciferol) 50000 units Caps capsule Commonly known as:  DRISDOL TAKE 1 CAPSULE BY MOUTH EVERY 7 DAYS       Allergies:  No Known Allergies  Past Medical History:  Diagnosis Date  . Anxiety   . Atrial flutter ([place])    typical appearing; s/p ablation 12-2012  . Constipation   . Depression    used to see psych, on no meds as of 03/2009  . Diabetes mellitus    TYPE 2  . Fatty liver   . GERD (gastroesophageal reflux disease)   . Gout   . Hyperlipidemia   . Hypothyroidism   .  Leg edema   . Obesity   . Persistent atrial fibrillation    PVI 12/2012  . Prediabetes   . Rhinitis    vasomotor  . S/P Minimally invasive maze operation  for atrial fibrillation 03/12/2016   Complete bilateral atrial lesion set using cryothermy and bipolar radiofrequency ablation with clipping of LA appendage via right mini thoracotomy approach  . S/P patent foramen ovale closure 03/12/2016  . Sleep apnea    mild, now treated with CPAP since ~9-12    Past Surgical History:  Procedure Laterality Date  . ABLATION OF DYSRHYTHMIC FOCUS  01/05/2013   PVI and flutter ablation by [name]  . ATRIAL FIBRILLATION ABLATION N/A 01/05/2013   Procedure: ATRIAL FIBRILLATION ABLATION;  Surgeon: Kneip Grayer, MD;  Location: [facility] CATH LAB;  Service: Cardiovascular;  Laterality: N/A;  . CARDIAC CATHETERIZATION N/A 01/12/2016   Procedure: Right/Left Heart Cath and Coronary Angiography;  Surgeon: [name], MD;  Location: [place] CV LAB;  Service: Cardiovascular;  Laterality: N/A;  . CARDIOVERSION  03/01/11  . CLIPPING OF ATRIAL APPENDAGE Left 03/12/2016   Procedure: CLIPPING OF ATRIAL APPENDAGE;  Surgeon: [name], MD;  Location: [place];  Service: Open Heart Surgery;  Laterality: Left;  . MINIMALLY INVASIVE MAZE PROCEDURE N/A 03/12/2016   Procedure: MINIMALLY INVASIVE MAZE PROCEDURE;  Surgeon: [name], MD;  Location: [place];  Service: Open Heart Surgery;  Laterality: N/A;  . NASAL SINUS SURGERY    . TEE WITHOUT CARDIOVERSION N/A 01/04/2013   Procedure: TRANSESOPHAGEAL ECHOCARDIOGRAM (TEE);  Surgeon: [name], MD;  Location: [facility] ENDOSCOPY;  Service: Cardiovascular;  Laterality: N/A;  . TEE WITHOUT CARDIOVERSION N/A 03/12/2016   Procedure: TRANSESOPHAGEAL ECHOCARDIOGRAM (TEE);  Surgeon: [name], MD;  Location: [place];  Service: Open Heart Surgery;  Laterality: N/A;  . TONSILLECTOMY    . VASECTOMY      Family History  Problem Relation Age of Onset  . Asthma Mother   . Breast  cancer Mother   . Dementia Mother   . Diabetes Father   . Hyperlipidemia Father   . Obesity Father   . Atrial fibrillation Father   . Hypertension Brother   . Melanoma Brother   . Allergies Daughter   . Throat cancer Maternal Uncle   . Colon cancer Paternal Grandfather   . Prostate cancer Neg Hx     Social History:  reports that he has never smoked. He has never used smokeless tobacco. He reports that he does not drink alcohol or use drugs.    Review of Systems         Lipids:  has high triglycerides mostly previously as high as 681; LDL previously also consistently high.   On Fenofibrate and taking Lipitor 10 mg daily  His LDL is well controlled but his triglycerides are slightly higher, recent labs not available, previously normal        Lab Results  Component Value Date   CHOL 164 12/07/2017   HDL 45 12/07/2017   LDLCALC 83 12/07/2017   TRIG 180 (H) 12/07/2017   CHOLHDL 3.6 12/07/2017                   Thyroid: He was diagnosed to have hypothyroidism in 1994  He is compliant with his generic levothyroxine  Previously had been on 200 mcg dosage  Has been on levothyroxine 175 mcg daily His TSH is consistently normal No fatigue  Lab Results  Component Value Date   TSH 0.500 12/07/2017   TSH 1.150 08/05/2017   TSH 0.417 (L) 05/10/2017   FREET4 1.85 (H) 08/05/2017   FREET4 1.93 (H) 02/10/2017   FREET4 1.78 (H) 06/14/2016  The blood pressure has been treated by cardiologist with low-dose lisinopril and metoprolol   BP Readings from Last 3 Encounters:  03/17/18 130/84  12/15/17 122/76  10/21/17 114/68   Has been prescribed vitamin D weekly, needs follow-up level  He is asking about checking testosterone since he is having some fatigue, decreased motivation and libido In 2012 this was checked and free testosterone was low normal but has not been on treatment previously   Physical Examination:  BP 130/84   Pulse 76   Ht 6\' 3"  (1.905 m)   Wt  232 lb (105.2 kg)   SpO2 96%   BMI 29.00 kg/m      ASSESSMENT:  Diabetes type 2, with mild obesity  He is on a 3 drug regimen of metformin, Trulicity 1.5 mg and Invokana 300 mg  See history of present illness for discussion of his current management, blood sugar patterns and problems identified.   His A1c is again lower than expected for his blood sugars at home and now only 5.9  His blood sugars are averaging about 125 in the morning and can be better Also he does not monitor readings after meals He has difficulty getting around to picking his finger or blood sugar testing Discussed that if he is able to get the freestyle libre covered by insurance this may be more useful for him to check his readings more conveniently and completely and especially after different meals This may also help him with compliance with his diet which he is not able to follow consistently Recommended regular exercise  No change in medications  Since he had failed a trial of Belviq will not pursue another medication  However need to consider Ozempic if covered next year   HYPERTRIGLYCERIDEMIA: Labs pending, discussed continue to improve diet  History of hyperuricemia: He wants to get the uric acid level checked, is on chronic allopurinol  HYPOTHYROIDISM: TSH needs to be rechecked also  FATIGUE and decreased motivation, mild depression and decreased libido: We will check testosterone Discussed potential treatment with testosterone supplementation and possible side effects  PLAN:  As above, follow-up in 4 months  Patient Instructions  Check blood sugars on waking up days a week  Also check blood sugars about 2 hours after meals and do this after different meals by rotation  Recommended blood sugar levels on waking up are 80-130 and about 2 hours after meal is 130-160  Please bring your blood sugar monitor to each visit, thank you    Total visit time for evaluation and management of  multiple problems and counseling =25 minutes   Elayne Snare 03/17/2018, 8:23 AM   Note: This office note was prepared with Dragon voice recognition system technology. Any transcriptional errors that result from this process are unintentional.

## 2018-03-17 NOTE — Addendum Note (Signed)
Addended by: Elayne Snare on: 03/17/2018 09:18 PM   Modules accepted: Level of Service

## 2018-03-17 NOTE — Patient Instructions (Signed)
Check blood sugars on waking up days a week  Also check blood sugars about 2 hours after meals and do this after different meals by rotation  Recommended blood sugar levels on waking up are 80-130 and about 2 hours after meal is 130-160  Please bring your blood sugar monitor to each visit, thank you  

## 2018-03-17 NOTE — Telephone Encounter (Signed)
Patient dropping off health screening form for insurance to be signed by dr. Larose Kells to attest that the patient is not a smoker. Patient states that wife Larena Glassman on [place]) will pick up once ready. Time sensitive- reimbursement. Document placed in provider tray up front.

## 2018-03-20 NOTE — Telephone Encounter (Signed)
Pt's spouse dropped off [place] form to appeal that patient has never used tobacco and/or tobacco products, so that he is eligible to receive incentive; forwarded to provider/SLS 10/21

## 2018-03-21 NOTE — Telephone Encounter (Signed)
° ° ° °  Pt is asking if form he dropped off is ready for pick up. He has to send the form back in this week

## 2018-03-22 ENCOUNTER — Other Ambulatory Visit: Payer: Self-pay | Admitting: Endocrinology

## 2018-03-22 NOTE — Telephone Encounter (Signed)
Pt wants to know if the form is ready to be picked up

## 2018-03-23 NOTE — Telephone Encounter (Signed)
Spoke w/ Pt's wife Larena Glassman- informed form has been completed and placed at front desk for pick up. Larena Glassman verbalized understanding. Copy of form sent for scanning.

## 2018-03-29 DIAGNOSIS — E1165 Type 2 diabetes mellitus with hyperglycemia: Secondary | ICD-10-CM | POA: Diagnosis not present

## 2018-03-29 DIAGNOSIS — R5383 Other fatigue: Secondary | ICD-10-CM | POA: Diagnosis not present

## 2018-03-29 DIAGNOSIS — E559 Vitamin D deficiency, unspecified: Secondary | ICD-10-CM | POA: Diagnosis not present

## 2018-03-29 DIAGNOSIS — E790 Hyperuricemia without signs of inflammatory arthritis and tophaceous disease: Secondary | ICD-10-CM | POA: Diagnosis not present

## 2018-03-29 DIAGNOSIS — E063 Autoimmune thyroiditis: Secondary | ICD-10-CM | POA: Diagnosis not present

## 2018-03-31 LAB — COMPREHENSIVE METABOLIC PANEL
A/G RATIO: 2.5 — AB (ref 1.2–2.2)
ALK PHOS: 43 IU/L (ref 39–117)
ALT: 24 IU/L (ref 0–44)
AST: 22 IU/L (ref 0–40)
Albumin: 4.9 g/dL (ref 3.5–5.5)
BILIRUBIN TOTAL: 0.6 mg/dL (ref 0.0–1.2)
BUN / CREAT RATIO: 19 (ref 9–20)
BUN: 20 mg/dL (ref 6–24)
CALCIUM: 10.1 mg/dL (ref 8.7–10.2)
CHLORIDE: 100 mmol/L (ref 96–106)
CO2: 22 mmol/L (ref 20–29)
Creatinine, Ser: 1.08 mg/dL (ref 0.76–1.27)
GFR calc non Af Amer: 75 mL/min/{1.73_m2} (ref >59)
GFR, EST AFRICAN AMERICAN: 87 mL/min/{1.73_m2} (ref >59)
GLOBULIN, TOTAL: 2 g/dL (ref 1.5–4.5)
Glucose: 111 mg/dL — ABNORMAL HIGH (ref 65–99)
Potassium: 4.6 mmol/L (ref 3.5–5.2)
SODIUM: 140 mmol/L (ref 134–144)
Total Protein: 6.9 g/dL (ref 6.0–8.5)

## 2018-03-31 LAB — URIC ACID: Uric Acid: 5.5 mg/dL (ref 3.7–8.6)

## 2018-03-31 LAB — LIPID PANEL
Chol/HDL Ratio: 3.5 ratio (ref 0.0–5.0)
Cholesterol, Total: 154 mg/dL (ref 100–199)
HDL: 44 mg/dL (ref >39)
LDL CALC: 80 mg/dL (ref 0–99)
Triglycerides: 151 mg/dL — ABNORMAL HIGH (ref 0–149)
VLDL Cholesterol Cal: 30 mg/dL (ref 5–40)

## 2018-03-31 LAB — VITAMIN D 25 HYDROXY (VIT D DEFICIENCY, FRACTURES): Vit D, 25-Hydroxy: 38.9 ng/mL (ref 30.0–100.0)

## 2018-03-31 LAB — TSH: TSH: 0.221 u[IU]/mL — ABNORMAL LOW (ref 0.450–4.500)

## 2018-03-31 LAB — TESTOSTERONE, FREE, TOTAL, SHBG
Sex Hormone Binding: 50.2 nmol/L (ref 19.3–76.4)
TESTOSTERONE FREE: 11.1 pg/mL (ref 7.2–24.0)
Testosterone: 414 ng/dL (ref 264–916)

## 2018-04-05 DIAGNOSIS — Z1211 Encounter for screening for malignant neoplasm of colon: Secondary | ICD-10-CM

## 2018-04-06 ENCOUNTER — Other Ambulatory Visit: Payer: Self-pay | Admitting: Endocrinology

## 2018-04-06 DIAGNOSIS — E559 Vitamin D deficiency, unspecified: Secondary | ICD-10-CM

## 2018-04-11 ENCOUNTER — Other Ambulatory Visit: Payer: Self-pay

## 2018-04-12 ENCOUNTER — Encounter: Payer: Self-pay | Admitting: Internal Medicine

## 2018-04-12 ENCOUNTER — Other Ambulatory Visit: Payer: Self-pay | Admitting: Internal Medicine

## 2018-04-12 ENCOUNTER — Ambulatory Visit (INDEPENDENT_AMBULATORY_CARE_PROVIDER_SITE_OTHER): Payer: 59 | Admitting: Internal Medicine

## 2018-04-12 VITALS — BP 126/74 | HR 82 | Temp 98.2°F | Resp 16 | Ht 75.0 in | Wt 234.0 lb

## 2018-04-12 DIAGNOSIS — I4891 Unspecified atrial fibrillation: Secondary | ICD-10-CM

## 2018-04-12 DIAGNOSIS — Z0000 Encounter for general adult medical examination without abnormal findings: Secondary | ICD-10-CM

## 2018-04-12 MED ORDER — ALLOPURINOL 300 MG PO TABS: 300.0000 mg | ORAL_TABLET | Freq: Every day | ORAL | 3 refills | Status: DC

## 2018-04-12 MED ORDER — FAMOTIDINE 20 MG PO TABS: 20.0000 mg | ORAL_TABLET | Freq: Every day | ORAL | 3 refills | Status: DC

## 2018-04-12 NOTE — Assessment & Plan Note (Addendum)
-   Td  2018,pnm 23 2015; prevnar 2016;  flu shot at work; shingrex discussed  -CCS: Cscope 08-2005: int.  hemorrhoids, referred to GI -Prostate cancer screening: DRE and  wnl 2018 -Labs reviewed -Diet and exercise discussed

## 2018-04-12 NOTE — Progress Notes (Signed)
Subjective:    Patient ID: Colin Wood, male    DOB: 1960/07/20, 57 y.o.   MRN: [id number]  DOS:  04/12/2018 Type of visit - description : For a physical exam, no major concerns Feels well   Review of Systems Was doing excellent with diet, has let his guard down a little and has gained some weight.  Other than above, a 14 point review of systems is negative      Past Medical History:  Diagnosis Date  . Anxiety   . Atrial flutter ([place])    typical appearing; s/p ablation 12-2012  . Constipation   . Depression    used to see psych, on no meds as of 03/2009  . Diabetes mellitus    TYPE 2  . Fatty liver   . GERD (gastroesophageal reflux disease)   . Gout   . Hyperlipidemia   . Hypothyroidism   . Leg edema   . Obesity   . Persistent atrial fibrillation    PVI 12/2012  . Prediabetes   . Rhinitis    vasomotor  . S/P Minimally invasive maze operation for atrial fibrillation 03/12/2016   Complete bilateral atrial lesion set using cryothermy and bipolar radiofrequency ablation with clipping of LA appendage via right mini thoracotomy approach  . S/P patent foramen ovale closure 03/12/2016  . Sleep apnea    mild, now treated with CPAP since ~9-12    Past Surgical History:  Procedure Laterality Date  . ABLATION OF DYSRHYTHMIC FOCUS  01/05/2013   PVI and flutter ablation by [name]  . ATRIAL FIBRILLATION ABLATION N/A 01/05/2013   Procedure: ATRIAL FIBRILLATION ABLATION;  Surgeon: Geurts Grayer, MD;  Location: [facility] CATH LAB;  Service: Cardiovascular;  Laterality: N/A;  . CARDIAC CATHETERIZATION N/A 01/12/2016   Procedure: Right/Left Heart Cath and Coronary Angiography;  Surgeon: [name], MD;  Location: [place] CV LAB;  Service: Cardiovascular;  Laterality: N/A;  . CARDIOVERSION  03/01/11  . CLIPPING OF ATRIAL APPENDAGE Left 03/12/2016   Procedure: CLIPPING OF ATRIAL APPENDAGE;  Surgeon: [name], MD;  Location: [place];  Service: Open Heart Surgery;   Laterality: Left;  . MINIMALLY INVASIVE MAZE PROCEDURE N/A 03/12/2016   Procedure: MINIMALLY INVASIVE MAZE PROCEDURE;  Surgeon: [name], MD;  Location: [place];  Service: Open Heart Surgery;  Laterality: N/A;  . NASAL SINUS SURGERY    . TEE WITHOUT CARDIOVERSION N/A 01/04/2013   Procedure: TRANSESOPHAGEAL ECHOCARDIOGRAM (TEE);  Surgeon: [name], MD;  Location: [facility] ENDOSCOPY;  Service: Cardiovascular;  Laterality: N/A;  . TEE WITHOUT CARDIOVERSION N/A 03/12/2016   Procedure: TRANSESOPHAGEAL ECHOCARDIOGRAM (TEE);  Surgeon: [name], MD;  Location: [place];  Service: Open Heart Surgery;  Laterality: N/A;  . TONSILLECTOMY    . VASECTOMY      Social History   Socioeconomic History  . Marital status: Married    Spouse name: Mardene Celeste  . Number of children: 2  . Years of education: Not on file  . Highest education level: Not on file  Occupational History  . 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 Financial resource strain: Not on file  . Food insecurity:    Worry: Not on file    Inability: Not on file  . Transportation needs:    Medical: Not on file    Non-medical: Not on file  Tobacco Use  . Smoking status: Never Smoker  . Smokeless tobacco: Never Used  Substance and Sexual Activity  . Alcohol  use: No  . Drug use: No  . Sexual activity: Not on file  Lifestyle  . Physical activity:    Days per week: Not on file    Minutes per session: Not on file  . Stress: Not on file  Relationships  . Social connections:    Talks on phone: Not on file    Gets together: Not on file    Attends religious service: Not on file    Active member of club or organization: Not on file    Attends meetings of clubs or organizations: Not on file    Relationship status: Not on file  . Intimate partner violence:    Fear of current or ex partner: Not on file    Emotionally abused: Not on file    Physically abused: Not on file    Forced sexual activity: Not on file  Other Topics Concern   . Not on file  Social History Narrative   Pt lives in [place] with spouse and 1 child   2 children  1988, 2000 .     Works in Engineer, technical sales at [employer].     Family History  Problem Relation Age of Onset  . Asthma Mother   . Breast cancer Mother   . Dementia Mother   . Diabetes Father   . Hyperlipidemia Father   . Obesity Father   . Atrial fibrillation Father   . Hypertension Brother   . Melanoma Brother   . Allergies Daughter   . Throat cancer Maternal Uncle   . Colon cancer Paternal Grandfather   . Prostate cancer Neg Hx      Allergies as of 04/12/2018   No Known Allergies     Medication List        Accurate as of 04/12/18  6:28 PM. Always use your most recent med list.          allopurinol 300 MG tablet Commonly known as:  ZYLOPRIM Take 1 tablet (300 mg total) by mouth daily.   atorvastatin 10 MG tablet Commonly known as:  LIPITOR TAKE 1 TABLET(10 MG) BY MOUTH DAILY   canagliflozin 300 MG Tabs tablet Commonly known as:  INVOKANA TAKE 1 TABLET BY MOUTH  DAILY BEFORE BREAKFAST   docusate sodium 100 MG capsule Commonly known as:  COLACE Take 100 mg by mouth daily.   famotidine 20 MG tablet Commonly known as:  PEPCID Take 1 tablet (20 mg total) by mouth at bedtime.   fenofibrate 160 MG tablet Take 1 tablet (160 mg total) by mouth daily.   FREESTYLE LIBRE 14 DAY SENSOR Misc 1 Units by Does not apply route every 14 (fourteen) days.   glucose blood test strip Use as instructed to check blood sugar 3 times daily.   levothyroxine 175 MCG tablet Commonly known as:  SYNTHROID, LEVOTHROID TAKE 1 TABLET BY MOUTH EVERY DAY   lisinopril 5 MG tablet Commonly known as:  PRINIVIL,ZESTRIL Take 1 tablet (5 mg total) by mouth daily.   metFORMIN 1000 MG tablet Commonly known as:  GLUCOPHAGE TAKE ONE TABLET BY MOUTH TWICE DAILY WITH MEALS   metoprolol succinate 25 MG 24 hr tablet Commonly known as:  TOPROL-XL Take 1 tablet (25 mg total) by mouth daily.     multivitamin tablet Take 1 tablet by mouth daily.   omega-3 acid ethyl esters 1 g capsule Commonly known as:  LOVAZA TAKE 2 CAPSULES BY MOUTH TWICE DAILY   ONE TOUCH LANCETS Misc Use lancets to check blood sugar  3 times daily.   rivaroxaban 20 MG Tabs tablet Commonly known as:  XARELTO Take 1 tablet (20 mg total) by mouth daily with supper.   TRULICITY 1.5 TK/3.5WS Sopn Generic drug:  Dulaglutide INJECT THE CONTENTS OF 1 PEN UNDER THE SKIN ONCE WEEKLY AS DIRECTED   VITAMIN C PO Take 2 tablets by mouth 2 (two) times daily.   Vitamin D (Ergocalciferol) 1.25 MG (50000 UT) Caps capsule Commonly known as:  DRISDOL TAKE 1 CAPSULE BY MOUTH EVERY 7 DAYS          Objective:   Physical Exam BP 126/74 (BP Location: Left Arm, Patient Position: Sitting, Cuff Size: Small)   Pulse 82   Temp 98.2 F (36.8 C) (Oral)   Resp 16   Ht 6\' 3"  (1.905 m)   Wt 234 lb (106.1 kg)   SpO2 98%   BMI 29.25 kg/m  General: Well developed, NAD, BMI noted Neck: No  thyromegaly  HEENT:  Normocephalic . Face symmetric, atraumatic Lungs:  CTA B Normal respiratory effort, no intercostal retractions, no accessory muscle use. Heart: RRR,  no murmur.  No pretibial edema bilaterally  Abdomen:  Not distended, soft, non-tender. No rebound or rigidity.   Skin: Exposed areas without rash. Not pale. Not jaundice Neurologic:  alert & oriented X3.  Speech normal, gait appropriate for age and unassisted Strength symmetric and appropriate for age.  Psych: Cognition and judgment appear intact.  Cooperative with normal attention span and concentration.  Behavior appropriate. No anxious or depressed appearing.     Assessment & Plan:   Assessment  DM Dr. Dwyane Dee since 07-2014 Hyperlipidemia Hypothyroidism A flutter, ablation 12-2012, then  Paroxysmal Afib, s/p Maze-PFO closure-[place] 02-2016 GERD Depression Obesity Gout [name], CPAP since 2012; d/c after wt loss 2019  PLAN DM: Per endocrinology,  on Invokana, metformin, Trulicity.  Last A1c excellent. A. Flutter, h/o: on Xarelto, Toprol.  Sees cardiology yearly, asymptomatic, on sinus rhythm per last EKG and exam today is normal. Hypothyroidism: Per Endo, last TSH is slightly suppressed, Dr Raliegh Ip recommendations shared with the patient  Obesity.  Last visit w/ Dr. Leafy Ro 07-2017, was doing great, and since then has gained few pounds but plans to go back to healthier diet and lose more weight.  Will not be able to go back to  Dr. Leafy Ro regularly mostly due to time constraints. RTC 1 year

## 2018-04-12 NOTE — Progress Notes (Signed)
Pre visit review using our clinic review tool, if applicable. No additional management support is needed unless otherwise documented below in the visit note. 

## 2018-04-12 NOTE — Patient Instructions (Addendum)
GO TO THE FRONT DESK Schedule your next appointment for a  Physical  Exam in one year   Notes recorded by Elayne Snare, MD on 04/06/2018 at 4:50 PM EST Thyroid level is on the high side, needs to take a half a tablet of the levothyroxine on Sundays while continuing 1 tablet on the other days. Other labs are okay including testosterone

## 2018-04-12 NOTE — Assessment & Plan Note (Signed)
DM: Per endocrinology, on Invokana, metformin, Trulicity.  Last A1c excellent. A. Flutter, h/o: on Xarelto, Toprol.  Sees cardiology yearly, asymptomatic, on sinus rhythm per last EKG and exam today is normal. Hypothyroidism: Per Endo, last TSH is slightly suppressed, Dr Raliegh Ip recommendations shared with the patient  Obesity.  Last visit w/ Dr. Leafy Ro 07-2017, was doing great, and since then has gained few pounds but plans to go back to healthier diet and lose more weight.  Will not be able to go back to  Dr. Leafy Ro regularly mostly due to time constraints. RTC 1 year

## 2018-04-13 ENCOUNTER — Encounter: Payer: Self-pay | Admitting: Gastroenterology

## 2018-04-17 ENCOUNTER — Other Ambulatory Visit: Payer: Self-pay | Admitting: Endocrinology

## 2018-04-20 ENCOUNTER — Encounter (HOSPITAL_COMMUNITY): Payer: Self-pay | Admitting: Nurse Practitioner

## 2018-04-20 ENCOUNTER — Ambulatory Visit (HOSPITAL_COMMUNITY)
Admission: RE | Admit: 2018-04-20 | Discharge: 2018-04-20 | Disposition: A | Discharge status: Home / Self Care | Payer: 59 | Source: Ambulatory Visit | Attending: Nurse Practitioner | Admitting: Nurse Practitioner

## 2018-04-20 VITALS — BP 110/72 | HR 76 | Ht 73.0 in | Wt 238.0 lb

## 2018-04-20 DIAGNOSIS — I4819 Other persistent atrial fibrillation: Secondary | ICD-10-CM | POA: Insufficient documentation

## 2018-04-20 DIAGNOSIS — Z79899 Other long term (current) drug therapy: Secondary | ICD-10-CM | POA: Insufficient documentation

## 2018-04-20 DIAGNOSIS — F419 Anxiety disorder, unspecified: Secondary | ICD-10-CM | POA: Insufficient documentation

## 2018-04-20 DIAGNOSIS — E119 Type 2 diabetes mellitus without complications: Secondary | ICD-10-CM | POA: Diagnosis not present

## 2018-04-20 DIAGNOSIS — E785 Hyperlipidemia, unspecified: Secondary | ICD-10-CM | POA: Diagnosis not present

## 2018-04-20 DIAGNOSIS — F329 Major depressive disorder, single episode, unspecified: Secondary | ICD-10-CM | POA: Diagnosis not present

## 2018-04-20 DIAGNOSIS — I10 Essential (primary) hypertension: Secondary | ICD-10-CM | POA: Insufficient documentation

## 2018-04-20 DIAGNOSIS — Z7984 Long term (current) use of oral hypoglycemic drugs: Secondary | ICD-10-CM | POA: Diagnosis not present

## 2018-04-20 DIAGNOSIS — E039 Hypothyroidism, unspecified: Secondary | ICD-10-CM | POA: Insufficient documentation

## 2018-04-20 DIAGNOSIS — K219 Gastro-esophageal reflux disease without esophagitis: Secondary | ICD-10-CM | POA: Diagnosis not present

## 2018-04-20 DIAGNOSIS — G4730 Sleep apnea, unspecified: Secondary | ICD-10-CM | POA: Insufficient documentation

## 2018-04-20 DIAGNOSIS — M109 Gout, unspecified: Secondary | ICD-10-CM | POA: Insufficient documentation

## 2018-04-20 DIAGNOSIS — Z7901 Long term (current) use of anticoagulants: Secondary | ICD-10-CM | POA: Diagnosis not present

## 2018-04-20 NOTE — Progress Notes (Signed)
Patient ID: Colin Wood, male   DOB: [birth year], 56 y.o.   MRN: [id number]     Primary Care Physician: Colon Branch, MD Referring Physician: Dr. Faythe Ghee is a 57 y.o. male with a h/o PAF. failing tikosyn in the past and as well as s/p ablation in 2014,who  developed  persistent afib. He had a Maze procedure, with clipping of left atrial appendage, last October. He has not had any afib since the 2nd day of surgery. It took about one month to recover but he is now back to baseline and is feeling much better.  F/u in afib clinic, 04/20/18, he continues to do well s/p maze procedure. No afib at all noted. Off AAD for some time now, continues on xarelto without bleeding issues.  Today, he denies symptoms of palpitations, chest pain, shortness of breath, orthopnea, PND, lower extremity edema, dizziness, presyncope, syncope, or neurologic sequela. The patient is tolerating medications without difficulties and is otherwise without complaint today.   Past Medical History:  Diagnosis Date  . Anxiety   . Atrial flutter ([place])    typical appearing; s/p ablation 12-2012  . Constipation   . Depression    used to see psych, on no meds as of 03/2009  . Diabetes mellitus    TYPE 2  . Fatty liver   . GERD (gastroesophageal reflux disease)   . Gout   . Hyperlipidemia   . Hypothyroidism   . Leg edema   . Obesity   . Persistent atrial fibrillation    PVI 12/2012  . Prediabetes   . Rhinitis    vasomotor  . S/P Minimally invasive maze operation for atrial fibrillation 03/12/2016   Complete bilateral atrial lesion set using cryothermy and bipolar radiofrequency ablation with clipping of LA appendage via right mini thoracotomy approach  . S/P patent foramen ovale closure 03/12/2016  . Sleep apnea    mild, now treated with CPAP since ~9-12   Past Surgical History:  Procedure Laterality Date  . ABLATION OF DYSRHYTHMIC FOCUS  01/05/2013   PVI and flutter ablation by [name]    . ATRIAL FIBRILLATION ABLATION N/A 01/05/2013   Procedure: ATRIAL FIBRILLATION ABLATION;  Surgeon: Seely Grayer, MD;  [facility];  Service: Cardiovascular;  Laterality: N/A;  . CARDIAC CATHETERIZATION N/A 01/12/2016   Procedure: Right/Left Heart Cath and Coronary Angiography;  Surgeon: [name], MD;  Location: [place] CV LAB;  Service: Cardiovascular;  Laterality: N/A;  . CARDIOVERSION  03/01/11  . CLIPPING OF ATRIAL APPENDAGE Left 03/12/2016   Procedure: CLIPPING OF ATRIAL APPENDAGE;  Surgeon: [name], MD;  Location: [place];  Service: Open Heart Surgery;  Laterality: Left;  . MINIMALLY INVASIVE MAZE PROCEDURE N/A 03/12/2016   Procedure: MINIMALLY INVASIVE MAZE PROCEDURE;  Surgeon: [name], MD;  Location: [place];  Service: Open Heart Surgery;  Laterality: N/A;  . NASAL SINUS SURGERY    . TEE WITHOUT CARDIOVERSION N/A 01/04/2013   Procedure: TRANSESOPHAGEAL ECHOCARDIOGRAM (TEE);  Surgeon: [name], MD;  Location: [facility] ENDOSCOPY;  Service: Cardiovascular;  Laterality: N/A;  . TEE WITHOUT CARDIOVERSION N/A 03/12/2016   Procedure: TRANSESOPHAGEAL ECHOCARDIOGRAM (TEE);  Surgeon: [name], MD;  Location: [place];  Service: Open Heart Surgery;  Laterality: N/A;  . TONSILLECTOMY    . VASECTOMY      Current Outpatient Medications  Medication Sig Dispense Refill  . allopurinol (ZYLOPRIM) 300 MG tablet Take 1 tablet (300 mg total) by  mouth daily. 90 tablet 3  . Ascorbic Acid (VITAMIN C PO) Take 2 tablets by mouth 2 (two) times daily.    . Continuous Blood Gluc Sensor (FREESTYLE LIBRE 14 DAY SENSOR) MISC 1 Units by Does not apply route every 14 (fourteen) days. 2 each 4  . docusate sodium (COLACE) 100 MG capsule Take 100 mg by mouth daily.    . famotidine (PEPCID) 20 MG tablet Take 1 tablet (20 mg total) by mouth at bedtime. 90 tablet 3  . fenofibrate 160 MG tablet Take 1 tablet (160 mg total) by mouth daily. 90 tablet 3  . glucose blood (ONETOUCH VERIO) test  strip Use as instructed to check blood sugar 3 times daily. 100 each 3  . INVOKANA 300 MG TABS tablet TAKE 1 TABLET BY MOUTH DAILY BEFORE BREAKFAST 90 tablet 0  . levothyroxine (SYNTHROID, LEVOTHROID) 175 MCG tablet TAKE 1 TABLET BY MOUTH EVERY DAY 90 tablet 0  . lisinopril (PRINIVIL,ZESTRIL) 5 MG tablet Take 1 tablet (5 mg total) by mouth daily. 90 tablet 3  . metFORMIN (GLUCOPHAGE) 1000 MG tablet TAKE ONE TABLET BY MOUTH TWICE DAILY WITH MEALS 180 tablet 1  . metoprolol succinate (TOPROL-XL) 25 MG 24 hr tablet Take 1 tablet (25 mg total) by mouth daily. 90 tablet 3  . Multiple Vitamin (MULTIVITAMIN) tablet Take 1 tablet by mouth daily.    [name] omega-3 acid ethyl esters (LOVAZA) 1 g capsule TAKE 2 CAPSULES BY MOUTH TWICE DAILY 360 capsule 0  . ONE TOUCH LANCETS MISC Use lancets to check blood sugar 3 times daily. 100 each 3  . rivaroxaban (XARELTO) 20 MG TABS tablet Take 1 tablet (20 mg total) by mouth daily with supper. 90 tablet 3  . TRULICITY 1.5 ER/7.4YC SOPN INJECT THE CONTENTS OF 1 PEN UNDER THE SKIN ONCE WEEKLY AS DIRECTED 6 mL 0  . Vitamin D, Ergocalciferol, (DRISDOL) 1.25 MG (50000 UT) CAPS capsule TAKE 1 CAPSULE BY MOUTH EVERY 7 DAYS 12 capsule 0  . atorvastatin (LIPITOR) 10 MG tablet TAKE 1 TABLET(10 MG) BY MOUTH DAILY 90 tablet 0   No current facility-administered medications for this encounter.     No Known Allergies  Social History   Socioeconomic History  . Marital status: Married    Spouse name: Mardene Celeste  . Number of children: 2  . Years of education: Not on file  . Highest education level: Not on file  Occupational History  . 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 Financial resource strain: Not on file  . Food insecurity:    Worry: Not on file    Inability: Not on file  . Transportation needs:    Medical: Not on file    Non-medical: Not on file  Tobacco Use  . Smoking status: Never Smoker  . Smokeless tobacco: Never Used  Substance and Sexual Activity  .  Alcohol use: No  . Drug use: No  . Sexual activity: Not on file  Lifestyle  . Physical activity:    Days per week: Not on file    Minutes per session: Not on file  . Stress: Not on file  Relationships  . Social connections:    Talks on phone: Not on file    Gets together: Not on file    Attends religious service: Not on file    Active member of club or organization: Not on file    Attends meetings of clubs or organizations: Not on file    Relationship status: Not on  file  . Intimate partner violence:    Fear of current or ex partner: Not on file    Emotionally abused: Not on file    Physically abused: Not on file    Forced sexual activity: Not on file  Other Topics Concern  . Not on file  Social History Narrative   Pt lives in [place] with spouse and 1 child   2 children  1988, 2000 .     Works in Engineer, technical sales at [employer].    Family History  Problem Relation Age of Onset  . Asthma Mother   . Breast cancer Mother   . Dementia Mother   . Diabetes Father   . Hyperlipidemia Father   . Obesity Father   . Atrial fibrillation Father   . Hypertension Brother   . Melanoma Brother   . Allergies Daughter   . Throat cancer Maternal Uncle   . Colon cancer Paternal Grandfather   . Prostate cancer Neg Hx     ROS- All systems are reviewed and negative except as per the HPI above  Physical Exam: Vitals:   04/20/18 0847  BP: 110/72  Pulse: 76  Weight: 108 kg  Height: 6\' 1"  (1.854 m)    GEN- The patient is well appearing, alert and oriented x 3 today.   Head- normocephalic, atraumatic Eyes-  Sclera clear, conjunctiva pink Ears- hearing intact Oropharynx- clear Neck- supple, no JVP Lymph- no cervical lymphadenopathy Lungs- Clear to ausculation bilaterally, normal work of breathing Heart-Irregular rate and rhythm, no murmurs, rubs or gallops, PMI not laterally displaced GI- soft, NT, ND, + BS Extremities- no clubbing, cyanosis, or edema MS- no significant deformity or  atrophy Skin- no rash or lesion Psych- euthymic mood, full affect Neuro- strength and sensation are intact  EKG-NSR at 73 bpm, LAD,RBBB, PR int 166 ms, qrs int 130 ms, qtc 471 ms Epic records reviewed  Assessment and Plan: 1. AFib s/p maze procedure Maintaining SR since procedure off AAD therapy Continue xarelto with chadsvasc score of 2 (htn, DM)  2. Lifestyle factors Encouraged exercise, weight loss Use cpap  F/u with [name] in 6 months  Geroge Baseman. Bayden Gil, [facility] [address] [place], [place] [address] [phone]

## 2018-05-18 ENCOUNTER — Encounter: Payer: Self-pay | Admitting: Gastroenterology

## 2018-05-18 ENCOUNTER — Ambulatory Visit: Payer: 59 | Admitting: Gastroenterology

## 2018-05-18 ENCOUNTER — Telehealth: Payer: Self-pay

## 2018-05-18 VITALS — BP 96/64 | HR 78 | Ht 73.0 in | Wt 236.0 lb

## 2018-05-18 DIAGNOSIS — Z7901 Long term (current) use of anticoagulants: Secondary | ICD-10-CM | POA: Diagnosis not present

## 2018-05-18 DIAGNOSIS — I4891 Unspecified atrial fibrillation: Secondary | ICD-10-CM

## 2018-05-18 DIAGNOSIS — G4733 Obstructive sleep apnea (adult) (pediatric): Secondary | ICD-10-CM

## 2018-05-18 DIAGNOSIS — Z1211 Encounter for screening for malignant neoplasm of colon: Secondary | ICD-10-CM | POA: Diagnosis not present

## 2018-05-18 MED ORDER — NA SULFATE-K SULFATE-MG SULF 17.5-3.13-1.6 GM/177ML PO SOLN: 1.0000 | Freq: Once | ORAL | 0 refills | Status: AC

## 2018-05-18 NOTE — Progress Notes (Signed)
[facility] Consult Note:  History: Colin Wood 05/18/2018  Referring physician: Colon Branch, MD  Reason for consult/chief complaint: Colon Cancer Screening (pt does report a rare flare up of hemorrhoids that results in small amount of brb on tissue; he takes a stool softener which prevents this from happening very often)   Subjective  HPI:  Colin Wood was seen at the request of primary care for colon cancer screening.  He has a history of atrial fibrillation that failed antiarrhythmic therapy, and he underwent maze procedure with closure of PFO 2 years ago.  I reviewed his cardiology office note from last month, indicating he is maintained on oral anticoagulation due to a chadsvasc score of 2.  He has occasional constipation that is under good control with stool softener.  When the constipation is more of an issue, he has rare hemorrhoidal bleeding. He had a colonoscopy in April 2007 by Dr. Deatra Ina because of colon cancer in a grandparent and history of colon polyps in his father.  No polyps were found, and external hemorrhoid was described, he appears to have a hypertrophied anal papilla on the retroflexion photo.  ROS:  Review of Systems He denies chest pain dyspnea or dysuria.  His exercise tolerance is good.  Past Medical History: Past Medical History:  Diagnosis Date  . Anxiety   . Atrial flutter ([place])    typical appearing; s/p ablation 12-2012  . Constipation   . Depression    used to see psych, on no meds as of 03/2009  . Diabetes mellitus    TYPE 2  . Fatty liver   . GERD (gastroesophageal reflux disease)   . Gout   . Hyperlipidemia   . Hypothyroidism   . Leg edema   . Obesity   . Persistent atrial fibrillation    PVI 12/2012  . Prediabetes   . Rhinitis    vasomotor  . S/P Minimally invasive maze operation for atrial fibrillation 03/12/2016   Complete bilateral atrial lesion set using cryothermy and bipolar radiofrequency ablation with  clipping of LA appendage via right mini thoracotomy approach  . S/P patent foramen ovale closure 03/12/2016  . Sleep apnea    mild, now treated with CPAP since ~9-12     Past Surgical History: Past Surgical History:  Procedure Laterality Date  . ABLATION OF DYSRHYTHMIC FOCUS  01/05/2013   PVI and flutter ablation by [name]  . ATRIAL FIBRILLATION ABLATION N/A 01/05/2013   Procedure: ATRIAL FIBRILLATION ABLATION;  Surgeon: Kosier Grayer, MD;  Location: [facility] CATH LAB;  Service: Cardiovascular;  Laterality: N/A;  . CARDIAC CATHETERIZATION N/A 01/12/2016   Procedure: Right/Left Heart Cath and Coronary Angiography;  Surgeon: [name], MD;  Location: [place] CV LAB;  Service: Cardiovascular;  Laterality: N/A;  . CARDIOVERSION  03/01/11  . CLIPPING OF ATRIAL APPENDAGE Left 03/12/2016   Procedure: CLIPPING OF ATRIAL APPENDAGE;  Surgeon: [name], MD;  Location: [place];  Service: Open Heart Surgery;  Laterality: Left;  . MINIMALLY INVASIVE MAZE PROCEDURE N/A 03/12/2016   Procedure: MINIMALLY INVASIVE MAZE PROCEDURE;  Surgeon: [name], MD;  Location: [place];  Service: Open Heart Surgery;  Laterality: N/A;  . NASAL SINUS SURGERY    . TEE WITHOUT CARDIOVERSION N/A 01/04/2013   Procedure: TRANSESOPHAGEAL ECHOCARDIOGRAM (TEE);  Surgeon: [name], MD;  Location: [facility] ENDOSCOPY;  Service: Cardiovascular;  Laterality: N/A;  . TEE WITHOUT CARDIOVERSION N/A 03/12/2016   Procedure: TRANSESOPHAGEAL ECHOCARDIOGRAM (TEE);  Surgeon: [name]  Roxy Manns, MD;  Location: [place];  Service: Open Heart Surgery;  Laterality: N/A;  . TONSILLECTOMY    . VASECTOMY       Family History: Family History  Problem Relation Age of Onset  . Asthma Mother   . Breast cancer Mother   . Dementia Mother   . Diabetes Father   . Hyperlipidemia Father   . Obesity Father   . Atrial fibrillation Father   . Hypertension Brother   . Melanoma Brother   . Allergies Daughter   . Throat cancer Maternal Uncle   .  Colon cancer Paternal Grandfather   . Prostate cancer Neg Hx     Social History: Social History   Socioeconomic History  . Marital status: Married    Spouse name: Mardene Celeste  . Number of children: 2  . Years of education: Not on file  . Highest education level: Not on file  Occupational History  . 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 Financial resource strain: Not on file  . Food insecurity:    Worry: Not on file    Inability: Not on file  . Transportation needs:    Medical: Not on file    Non-medical: Not on file  Tobacco Use  . Smoking status: Never Smoker  . Smokeless tobacco: Never Used  Substance and Sexual Activity  . Alcohol use: No  . Drug use: No  . Sexual activity: Not on file  Lifestyle  . Physical activity:    Days per week: Not on file    Minutes per session: Not on file  . Stress: Not on file  Relationships  . Social connections:    Talks on phone: Not on file    Gets together: Not on file    Attends religious service: Not on file    Active member of club or organization: Not on file    Attends meetings of clubs or organizations: Not on file    Relationship status: Not on file  Other Topics Concern  . Not on file  Social History Narrative   Pt lives in [place] with spouse and 1 child   2 children  1988, 2000 .     Works in Engineer, technical sales at [employer].    Allergies: No Known Allergies  Outpatient Meds: Current Outpatient Medications  Medication Sig Dispense Refill  . allopurinol (ZYLOPRIM) 300 MG tablet Take 1 tablet (300 mg total) by mouth daily. 90 tablet 3  . Ascorbic Acid (VITAMIN C PO) Take 2 tablets by mouth 2 (two) times daily.    [name] atorvastatin (LIPITOR) 10 MG tablet TAKE 1 TABLET(10 MG) BY MOUTH DAILY 90 tablet 0  . Continuous Blood Gluc Sensor (FREESTYLE LIBRE 14 DAY SENSOR) MISC 1 Units by Does not apply route every 14 (fourteen) days. 2 each 4  . docusate sodium (COLACE) 100 MG capsule Take 100 mg by mouth daily.    . famotidine (PEPCID)  20 MG tablet Take 1 tablet (20 mg total) by mouth at bedtime. 90 tablet 3  . fenofibrate 160 MG tablet Take 1 tablet (160 mg total) by mouth daily. 90 tablet 3  . glucose blood (ONETOUCH VERIO) test strip Use as instructed to check blood sugar 3 times daily. 100 each 3  . INVOKANA 300 MG TABS tablet TAKE 1 TABLET BY MOUTH DAILY BEFORE BREAKFAST 90 tablet 0  . levothyroxine (SYNTHROID, LEVOTHROID) 175 MCG tablet TAKE 1 TABLET BY MOUTH EVERY DAY 90 tablet 0  . lisinopril (PRINIVIL,ZESTRIL) 5  MG tablet Take 1 tablet (5 mg total) by mouth daily. 90 tablet 3  . metFORMIN (GLUCOPHAGE) 1000 MG tablet TAKE ONE TABLET BY MOUTH TWICE DAILY WITH MEALS 180 tablet 1  . metoprolol succinate (TOPROL-XL) 25 MG 24 hr tablet Take 1 tablet (25 mg total) by mouth daily. 90 tablet 3  . Multiple Vitamin (MULTIVITAMIN) tablet Take 1 tablet by mouth daily.    [name] omega-3 acid ethyl esters (LOVAZA) 1 g capsule TAKE 2 CAPSULES BY MOUTH TWICE DAILY 360 capsule 0  . ONE TOUCH LANCETS MISC Use lancets to check blood sugar 3 times daily. 100 each 3  . rivaroxaban (XARELTO) 20 MG TABS tablet Take 1 tablet (20 mg total) by mouth daily with supper. 90 tablet 3  . TRULICITY 1.5 HO/8.8LN SOPN INJECT THE CONTENTS OF 1 PEN UNDER THE SKIN ONCE WEEKLY AS DIRECTED 6 mL 0  . Vitamin D, Ergocalciferol, (DRISDOL) 1.25 MG (50000 UT) CAPS capsule TAKE 1 CAPSULE BY MOUTH EVERY 7 DAYS 12 capsule 0  . Na Sulfate-K Sulfate-Mg Sulf 17.5-3.13-1.6 GM/177ML SOLN Take 1 kit by mouth once for 1 dose. 354 mL 0   No current facility-administered medications for this visit.       ___________________________________________________________________ Objective   Exam:  BP 96/64   Pulse 78   Ht _0  (1.854 m)   Wt 236 lb (107 kg)   BMI 31.14 kg/m    General: this is a(n) well-appearing man  Eyes: sclera anicteric, no redness  ENT: oral mucosa moist without lesions, no cervical or supraclavicular lymphadenopathy  CV: RRR without murmur,  S1/S2, no JVD, no peripheral edema  Resp: clear to auscultation bilaterally, normal RR and effort noted  GI: soft, no tenderness, with active bowel sounds. No guarding or palpable organomegaly noted.  Skin; warm and dry, no rash or jaundice noted  Neuro: awake, alert and oriented x 3. Normal gross motor function and fluent speech  Assessment: Encounter Diagnoses  Name Primary?  . Special screening for malignant neoplasms, colon Yes  . Current use of long term anticoagulation   . Atrial fibrillation, unspecified type ([place])   . [name] (obstructive sleep apnea)     Average risk for colorectal cancer.  He is appropriate for screening colonoscopy.  Must to be off oral anticoagulation 2 days prior with approval of his cardiologist.  We will contact cardiologist/anticoagulation clinic.  He is not currently in A. fib, so I suspect they will feel this is okay.  I discussed with [place] mall but real risk of cerebrovascular event while off anticoagulation and he is agreeable.   Plan:  Colonoscopy.  Risks and benefits reviewed.  The benefits and risks of the planned procedure were described in detail with the patient or (when appropriate) their health care proxy.  Risks were outlined as including, but not limited to, bleeding, infection, perforation, adverse medication reaction leading to cardiac or pulmonary decompensation, or pancreatitis (if ERCP).  The limitation of incomplete mucosal visualization was also discussed.  No guarantees or warranties were given.   Thank you for the courtesy of this consult.  Please call me with any questions or concerns.  Nelida Meuse III  CC: Referring provider noted above

## 2018-05-18 NOTE — Telephone Encounter (Signed)
[facility] Pre-operative Risk Assessment     Request for surgical clearance:     Endoscopy Procedure  What type of surgery is being performed?     Colonoscopy   When is this surgery scheduled?     06-20-2018  What type of clearance is required ?   Pharmacy  Are there any medications that need to be held prior to surgery and how long? Xarelto, 2 days  Practice name and name of physician performing surgery?      [facility]  What is your office phone and fax number?      Phone- [phone]  [phone]  Anesthesia type (None, local, MAC, general) ?       MAC

## 2018-05-18 NOTE — Patient Instructions (Signed)
If you are age 58 or older, your body mass index should be between 23-30. Your Body mass index is 31.14 kg/m. If this is out of the aforementioned range listed, please consider follow up with your Primary Care Provider.  If you are age 67 or younger, your body mass index should be between 19-25. Your Body mass index is 31.14 kg/m. If this is out of the aformentioned range listed, please consider follow up with your Primary Care Provider.   You have been scheduled for a colonoscopy. Please follow written instructions given to you at your visit today.  Please pick up your prep supplies at the pharmacy within the next 1-3 days. If you use inhalers (even only as needed), please bring them with you on the day of your procedure. Your physician has requested that you go to www.startemmi.com and enter the access code given to you at your visit today. This web site gives a general overview about your procedure. However, you should still follow specific instructions given to you by our office regarding your preparation for the procedure.  It was a pleasure to see you today!  Dr. Loletha Carrow

## 2018-05-19 NOTE — Telephone Encounter (Signed)
Left message for pt to return call.

## 2018-05-19 NOTE — Telephone Encounter (Signed)
Patient with diagnosis of afib on xarelto for anticoagulation.    Procedure: endoscopy Date of procedure: 06/20/18  CHADS2-VASc score of  2 (CHF, HTN, AGE, DM2, stroke/tia x 2, CAD, AGE, male)  CrCl 132ml/min  Per office protocol, patient can hold xaretlo for 2 days prior to procedure.

## 2018-05-22 NOTE — Telephone Encounter (Signed)
Patient has been notified and aware. He states clear understanding.  

## 2018-05-29 ENCOUNTER — Other Ambulatory Visit: Payer: Self-pay | Admitting: Endocrinology

## 2018-05-31 HISTORY — PX: COLONOSCOPY: SHX174

## 2018-06-09 ENCOUNTER — Other Ambulatory Visit: Payer: Self-pay | Admitting: Endocrinology

## 2018-06-09 DIAGNOSIS — E1165 Type 2 diabetes mellitus with hyperglycemia: Secondary | ICD-10-CM

## 2018-06-15 ENCOUNTER — Other Ambulatory Visit: Payer: Self-pay | Admitting: Endocrinology

## 2018-06-16 ENCOUNTER — Other Ambulatory Visit: Payer: Self-pay

## 2018-06-16 ENCOUNTER — Telehealth: Payer: Self-pay | Admitting: Endocrinology

## 2018-06-16 DIAGNOSIS — E1165 Type 2 diabetes mellitus with hyperglycemia: Secondary | ICD-10-CM

## 2018-06-16 MED ORDER — GLUCOSE BLOOD VI STRP: ORAL_STRIP | 3 refills | Status: DC

## 2018-06-16 NOTE — Telephone Encounter (Signed)
Patient stated that he is returning your call and wanted to let you know that the one touch verio test strips are covered by insurance. This was an Journalist, newspaper up.

## 2018-06-16 NOTE — Telephone Encounter (Signed)
Noted. Rx sent for One Touch Verio test strips.

## 2018-06-20 ENCOUNTER — Encounter: Payer: Self-pay | Admitting: Gastroenterology

## 2018-06-20 ENCOUNTER — Ambulatory Visit (AMBULATORY_SURGERY_CENTER): Payer: 59 | Admitting: Gastroenterology

## 2018-06-20 VITALS — BP 118/73 | HR 74 | Temp 97.3°F | Resp 15 | Ht 73.0 in | Wt 236.0 lb

## 2018-06-20 DIAGNOSIS — D128 Benign neoplasm of rectum: Secondary | ICD-10-CM | POA: Diagnosis not present

## 2018-06-20 DIAGNOSIS — Z1211 Encounter for screening for malignant neoplasm of colon: Secondary | ICD-10-CM | POA: Diagnosis not present

## 2018-06-20 DIAGNOSIS — D129 Benign neoplasm of anus and anal canal: Secondary | ICD-10-CM

## 2018-06-20 MED ORDER — SODIUM CHLORIDE 0.9 % IV SOLN: 500.0000 mL | Freq: Once | INTRAVENOUS | Status: DC

## 2018-06-20 NOTE — Patient Instructions (Signed)
Please read handouts provided. Resume Xarelto at prior dose tomorrow evening. Await pathology results. Clip card. Continue present medications.     YOU HAD AN ENDOSCOPIC PROCEDURE TODAY AT [facility]:   Refer to the procedure report that was given to you for any specific questions about what was found during the examination.  If the procedure report does not answer your questions, please call your gastroenterologist to clarify.  If you requested that your care partner not be given the details of your procedure findings, then the procedure report has been included in a sealed envelope for you to review at your convenience later.  YOU SHOULD EXPECT: Some feelings of bloating in the abdomen. Passage of more gas than usual.  Walking can help get rid of the air that was put into your GI tract during the procedure and reduce the bloating. If you had a lower endoscopy (such as a colonoscopy or flexible sigmoidoscopy) you may notice spotting of blood in your stool or on the toilet paper. If you underwent a bowel prep for your procedure, you may not have a normal bowel movement for a few days.  Please Note:  You might notice some irritation and congestion in your nose or some drainage.  This is from the oxygen used during your procedure.  There is no need for concern and it should clear up in a day or so.  SYMPTOMS TO REPORT IMMEDIATELY:   Following lower endoscopy (colonoscopy or flexible sigmoidoscopy):  Excessive amounts of blood in the stool  Significant tenderness or worsening of abdominal pains  Swelling of the abdomen that is new, acute  Fever of 100F or higher    For urgent or emergent issues, a gastroenterologist can be reached at any hour by calling [phone].   DIET:  We do recommend a small meal at first, but then you may proceed to your regular diet.  Drink plenty of fluids but you should avoid alcoholic beverages for 24 hours.  ACTIVITY:  You should plan to  take it easy for the rest of today and you should NOT DRIVE or use heavy machinery until tomorrow (because of the sedation medicines used during the test).    FOLLOW UP: Our staff will call the number listed on your records the next business day following your procedure to check on you and address any questions or concerns that you may have regarding the information given to you following your procedure. If we do not reach you, we will leave a message.  However, if you are feeling well and you are not experiencing any problems, there is no need to return our call.  We will assume that you have returned to your regular daily activities without incident.  If any biopsies were taken you will be contacted by phone or by letter within the next 1-3 weeks.  Please call us at [phone] if you have not heard about the biopsies in 3 weeks.    SIGNATURES/CONFIDENTIALITY: You and/or your care partner have signed paperwork which will be entered into your electronic medical record.  These signatures attest to the fact that that the information above on your After Visit Summary has been reviewed and is understood.  Full responsibility of the confidentiality of this discharge information lies with you and/or your care-partner.

## 2018-06-20 NOTE — Progress Notes (Signed)
PT taken to PACU. Monitors in place. VSS. Report given to RN. 

## 2018-06-20 NOTE — Progress Notes (Signed)
Called to room to assist during endoscopic procedure.  Patient ID and intended procedure confirmed with present staff. Received instructions for my participation in the procedure from the performing physician.  

## 2018-06-20 NOTE — Op Note (Signed)
[place] Patient Name: Colin Wood Procedure Date: 06/20/2018 10:06 AM MRN: [id number] Endoscopist: Mallie Mussel L. Loletha Carrow , MD Age: 58 Referring MD:  Date of Birth: Oct 12, 1960 Gender: Male Account #: [id number] Procedure:                Colonoscopy Indications:              Screening for colorectal malignant neoplasm (no                            polyps on 2007 colonoscopy) Medicines:                Monitored Anesthesia Care Procedure:                Pre-Anesthesia Assessment:                           - Prior to the procedure, a History and Physical                            was performed, and patient medications and                            allergies were reviewed. The patient's tolerance of                            previous anesthesia was also reviewed. The risks                            and benefits of the procedure and the sedation                            options and risks were discussed with the patient.                            All questions were answered, and informed consent                            was obtained. Prior Anticoagulants: The patient has                            taken Xarelto (rivaroxaban), last dose was 2 days                            prior to procedure. ASA Grade Assessment: II - A                            patient with mild systemic disease. After reviewing                            the risks and benefits, the patient was deemed in                            satisfactory condition to undergo the procedure.  After obtaining informed consent, the colonoscope                            was passed under direct vision. Throughout the                            procedure, the patient's blood pressure, pulse, and                            oxygen saturations were monitored continuously. The                            Colonoscope was introduced through the anus and                            advanced to the the  cecum, identified by                            appendiceal orifice and ileocecal valve. The                            colonoscopy was performed without difficulty. The                            patient tolerated the procedure well. The quality                            of the bowel preparation was good, after lavage.                            The ileocecal valve, appendiceal orifice, and                            rectum were photographed. Scope In: 10:17:55 AM Scope Out: 10:41:34 AM Scope Withdrawal Time: 0 hours 21 minutes 10 seconds  Total Procedure Duration: 0 hours 23 minutes 39 seconds  Findings:                 The digital rectal exam findings include palpable                            hypertrophied anal papilla and palpable posterior                            rectal polyp.                           A 15 mm polyp was found in the rectum. The polyp                            was sessile. The polyp was removed with a hot                            snare. Resection and retrieval were complete.  Additional ablation performed at edge of                            polypectomy site to ensure no residual polyp                            tissue. To prevent bleeding after the polypectomy,                            one hemostatic clip was successfully placed (MR                            conditional). There was no bleeding during the                            procedure.                           The exam was otherwise without abnormality on                            direct and retroflexion views. Complications:            No immediate complications. Estimated Blood Loss:     Estimated blood loss: none. Impression:               - Palpable hypertrophied anal papilla and palpable                            posterior rectal polyp. found on digital rectal                            exam.                           - One 15 mm polyp in the rectum, removed with  a hot                            snare. Resected and retrieved. Clip (MR                            conditional) was placed.                           - The examination was otherwise normal on direct                            and retroflexion views. Recommendation:           - Patient has a contact number available for                            emergencies. The signs and symptoms of potential                            delayed complications were discussed with the  patient. Return to normal activities tomorrow.                            Written discharge instructions were provided to the                            patient.                           - Resume previous diet.                           - Resume Xarelto (rivaroxaban) at prior dose                            tomorrow evening.                           - Await pathology results.                           - Repeat colonoscopy is recommended for                            surveillance. The colonoscopy date will be                            determined after pathology results from today's                            exam become available for review. Henry L. Loletha Carrow, MD 06/20/2018 10:48:18 AM This report has been signed electronically.

## 2018-06-21 ENCOUNTER — Other Ambulatory Visit: Payer: Self-pay | Admitting: Endocrinology

## 2018-06-21 ENCOUNTER — Telehealth: Payer: Self-pay

## 2018-06-21 NOTE — Telephone Encounter (Signed)
Left message on answering machine. 

## 2018-06-22 ENCOUNTER — Other Ambulatory Visit: Payer: Self-pay | Admitting: Endocrinology

## 2018-06-22 ENCOUNTER — Encounter: Payer: Self-pay | Admitting: Gastroenterology

## 2018-06-24 ENCOUNTER — Other Ambulatory Visit: Payer: Self-pay | Admitting: Endocrinology

## 2018-06-24 DIAGNOSIS — E559 Vitamin D deficiency, unspecified: Secondary | ICD-10-CM

## 2018-06-27 ENCOUNTER — Telehealth: Payer: Self-pay

## 2018-06-27 NOTE — Telephone Encounter (Signed)
PA initiated for Livalo 1GM take 2 tablets by mouth twice daily. HRV:[id number]

## 2018-06-28 ENCOUNTER — Telehealth: Payer: Self-pay

## 2018-06-28 NOTE — Telephone Encounter (Signed)
PA for lovaza was denied PA# [id number]

## 2018-06-29 ENCOUNTER — Other Ambulatory Visit: Payer: Self-pay | Admitting: Internal Medicine

## 2018-07-03 ENCOUNTER — Other Ambulatory Visit: Payer: Self-pay

## 2018-07-03 MED ORDER — ICOSAPENT ETHYL 1 G PO CAPS: 2.0000 | ORAL_CAPSULE | Freq: Two times a day (BID) | ORAL | 3 refills | Status: DC

## 2018-07-06 ENCOUNTER — Telehealth: Payer: Self-pay

## 2018-07-06 NOTE — Telephone Encounter (Signed)
Error

## 2018-07-06 NOTE — Telephone Encounter (Signed)
PA initiated and approved for Vascepa 1gm tablets take 2 tabs by mouth twice daily. Key: [place] PA Case ID: [id number]  Rx #: [id number] Approval is good through 07/07/2019.

## 2018-07-14 ENCOUNTER — Other Ambulatory Visit: Payer: Self-pay | Admitting: Endocrinology

## 2018-07-15 ENCOUNTER — Other Ambulatory Visit: Payer: Self-pay | Admitting: Endocrinology

## 2018-07-17 ENCOUNTER — Other Ambulatory Visit: Payer: Self-pay | Admitting: Internal Medicine

## 2018-07-18 ENCOUNTER — Other Ambulatory Visit: Payer: Self-pay | Admitting: Internal Medicine

## 2018-07-18 NOTE — Progress Notes (Signed)
This encounter was created in error - please disregard.

## 2018-07-19 ENCOUNTER — Encounter: Payer: Self-pay | Admitting: Endocrinology

## 2018-07-19 ENCOUNTER — Encounter: Payer: 59 | Admitting: Endocrinology

## 2018-07-19 ENCOUNTER — Encounter: Payer: Self-pay | Admitting: Internal Medicine

## 2018-07-19 VITALS — Ht 73.0 in

## 2018-07-19 DIAGNOSIS — E782 Mixed hyperlipidemia: Secondary | ICD-10-CM

## 2018-07-19 DIAGNOSIS — E063 Autoimmune thyroiditis: Secondary | ICD-10-CM

## 2018-07-19 DIAGNOSIS — E1165 Type 2 diabetes mellitus with hyperglycemia: Secondary | ICD-10-CM

## 2018-07-20 ENCOUNTER — Other Ambulatory Visit: Payer: Self-pay | Admitting: Endocrinology

## 2018-07-20 DIAGNOSIS — E1165 Type 2 diabetes mellitus with hyperglycemia: Secondary | ICD-10-CM | POA: Diagnosis not present

## 2018-07-21 LAB — LIPID PANEL
Chol/HDL Ratio: 3.5 ratio (ref 0.0–5.0)
Cholesterol, Total: 173 mg/dL (ref 100–199)
HDL: 49 mg/dL (ref >39)
LDL Calculated: 95 mg/dL (ref 0–99)
TRIGLYCERIDES: 147 mg/dL (ref 0–149)
VLDL Cholesterol Cal: 29 mg/dL (ref 5–40)

## 2018-07-21 LAB — TSH: TSH: 1.13 u[IU]/mL (ref 0.450–4.500)

## 2018-07-21 LAB — COMPREHENSIVE METABOLIC PANEL
ALT: 26 IU/L (ref 0–44)
AST: 17 IU/L (ref 0–40)
Albumin/Globulin Ratio: 2.6 — ABNORMAL HIGH (ref 1.2–2.2)
Albumin: 5.1 g/dL — ABNORMAL HIGH (ref 3.8–4.9)
Alkaline Phosphatase: 45 IU/L (ref 39–117)
BUN/Creatinine Ratio: 16 (ref 9–20)
BUN: 19 mg/dL (ref 6–24)
Bilirubin Total: 0.5 mg/dL (ref 0.0–1.2)
CO2: 23 mmol/L (ref 20–29)
Calcium: 10.4 mg/dL — ABNORMAL HIGH (ref 8.7–10.2)
Chloride: 99 mmol/L (ref 96–106)
Creatinine, Ser: 1.16 mg/dL (ref 0.76–1.27)
GFR calc Af Amer: 80 mL/min/{1.73_m2} (ref >59)
GFR calc non Af Amer: 69 mL/min/{1.73_m2} (ref >59)
GLUCOSE: 141 mg/dL — AB (ref 65–99)
Globulin, Total: 2 g/dL (ref 1.5–4.5)
Potassium: 4.8 mmol/L (ref 3.5–5.2)
Sodium: 140 mmol/L (ref 134–144)
Total Protein: 7.1 g/dL (ref 6.0–8.5)

## 2018-07-27 ENCOUNTER — Encounter: Payer: Self-pay | Admitting: Internal Medicine

## 2018-07-27 ENCOUNTER — Ambulatory Visit: Payer: 59 | Admitting: Endocrinology

## 2018-07-27 ENCOUNTER — Ambulatory Visit (INDEPENDENT_AMBULATORY_CARE_PROVIDER_SITE_OTHER): Payer: 59 | Admitting: Internal Medicine

## 2018-07-27 VITALS — BP 118/78 | HR 81 | Ht 73.0 in | Wt 235.0 lb

## 2018-07-27 DIAGNOSIS — E1165 Type 2 diabetes mellitus with hyperglycemia: Secondary | ICD-10-CM

## 2018-07-27 LAB — POCT GLYCOSYLATED HEMOGLOBIN (HGB A1C): HEMOGLOBIN A1C: 6.5 % — AB (ref 4.0–5.6)

## 2018-07-27 NOTE — Patient Instructions (Addendum)
-   Continue Metformin 1000 mg Twice a day with meal s - Continue Trulicity 1.5 mg weekly  - Continue Invokana 300 mg daily    - Check sugars twice a day, fasting and bedtime

## 2018-07-27 NOTE — Progress Notes (Signed)
Name: Colin Wood  Age/ Sex: 58 y.o., male   MRN/ DOB: [id number], 1960-08-17     PCP: Colon Branch, MD   Reason for Endocrinology Evaluation: Type 2 Diabetes Mellitus  Initial Endocrine Consultative Visit: 08/22/2014    PATIENT IDENTIFIER: Colin Wood is a 58 y.o. male with a past medical history of A.Fib, HTN, [name]< T2DM, Hypothyroidism and dyslipidemia. The patient has followed with Endocrinology clinic since 08/22/2011 for consultative assistance with management of his diabetes.  DIABETIC HISTORY:  Colin Wood was diagnosed with T2DM in 2011, was initially on metformin , he was also tried on glipizide, and tradjenta. His hemoglobin A1c has ranged from 5.9% in 2019, peaking at 10.6% in 2012   SUBJECTIVE:   During the last visit (03/17/2018): His A1c was 5.9%, he was continued metformin, Trulicity, and Invokana.  Today (07/30/2018): Colin Wood is here for 8-month follow-up on his diabetes management. He checks his blood sugars 1 times daily, preprandial to breakfast. The patient has not had hypoglycemic episodes since the last clinic visit. Otherwise, the patient has not required any recent emergency interventions for hypoglycemia and has not had recent hospitalizations secondary to hyper or hypoglycemic episodes.    Patient is complaining of gas and abdominal bloating.  He does have acid reflux symptoms.  This has been getting worse.    THYROID HISTORY: He was diagnosed with hypothyroidism in 1994.  Patient states compliance with levothyroxine.  ROS: As per HPI and as detailed below: Review of Systems  Respiratory: Negative for cough and shortness of breath.   Cardiovascular: Negative for chest pain and palpitations.  Gastrointestinal: Positive for diarrhea. Negative for nausea.  Genitourinary: Negative for frequency.  Endo/Heme/Allergies: Negative for polydipsia.      HOME  DIABETES REGIMEN:  Metformin 7681 mg BID Trulicity 1.5 mg weekly  Invoknana 300 mg daily   Statin: yes ACE-I/ARB: yes    METER DOWNLOAD SUMMARY: Date range evaluated: 1/29-2/27 Fingerstick Blood Glucose Tests = 8 Overall Mean FS Glucose = 148.1 Standard Deviation = 33.0  BG Ranges: Low = 125 High = 168   Hypoglycemic Events/30 Days: BG < 50 = 0 Episodes of symptomatic severe hypoglycemia = 0    HISTORY:  Past Medical History:  Past Medical History:  Diagnosis Date  . Anxiety   . Atrial flutter ([place])    typical appearing; s/p ablation 12-2012  . Constipation   . Depression    used to see psych, on no meds as of 03/2009  . Diabetes mellitus    TYPE 2  . Fatty liver   . GERD (gastroesophageal reflux disease)   . Gout   . Hyperlipidemia   . Hypothyroidism   . Leg edema   . Obesity   . Persistent atrial fibrillation    PVI 12/2012  . Prediabetes   . Rhinitis    vasomotor  . S/P Minimally invasive maze operation for atrial fibrillation 03/12/2016   Complete bilateral atrial lesion set using cryothermy and bipolar radiofrequency ablation with clipping of LA appendage via right mini thoracotomy approach  . S/P patent foramen ovale closure 03/12/2016  . Sleep apnea    mild, now treated with CPAP since ~9-12   Past Surgical History:  Past Surgical History:  Procedure Laterality Date  . ABLATION OF DYSRHYTHMIC FOCUS  01/05/2013   PVI and flutter ablation by [name]  . ATRIAL FIBRILLATION ABLATION N/A 01/05/2013   Procedure: ATRIAL FIBRILLATION ABLATION;  Surgeon: Glasscock Grayer, MD;  Location: [facility]  CATH LAB;  Service: Cardiovascular;  Laterality: N/A;  . CARDIAC CATHETERIZATION N/A 01/12/2016   Procedure: Right/Left Heart Cath and Coronary Angiography;  Surgeon: [name], MD;  Location: [place] CV LAB;  Service: Cardiovascular;  Laterality: N/A;  . CARDIOVERSION  03/01/11  . CLIPPING OF ATRIAL APPENDAGE Left 03/12/2016   Procedure: CLIPPING OF ATRIAL  APPENDAGE;  Surgeon: [name], MD;  Location: [place];  Service: Open Heart Surgery;  Laterality: Left;  . MINIMALLY INVASIVE MAZE PROCEDURE N/A 03/12/2016   Procedure: MINIMALLY INVASIVE MAZE PROCEDURE;  Surgeon: [name], MD;  Location: [place];  Service: Open Heart Surgery;  Laterality: N/A;  . NASAL SINUS SURGERY    . TEE WITHOUT CARDIOVERSION N/A 01/04/2013   Procedure: TRANSESOPHAGEAL ECHOCARDIOGRAM (TEE);  Surgeon: [name], MD;  Location: [facility] ENDOSCOPY;  Service: Cardiovascular;  Laterality: N/A;  . TEE WITHOUT CARDIOVERSION N/A 03/12/2016   Procedure: TRANSESOPHAGEAL ECHOCARDIOGRAM (TEE);  Surgeon: [name], MD;  Location: [place];  Service: Open Heart Surgery;  Laterality: N/A;  . TONSILLECTOMY    . VASECTOMY      Social History:  reports that he has never smoked. He has never used smokeless tobacco. He reports that he does not drink alcohol or use drugs. Family History:  Family History  Problem Relation Age of Onset  . Asthma Mother   . Breast cancer Mother   . Dementia Mother   . Diabetes Father   . Hyperlipidemia Father   . Obesity Father   . Atrial fibrillation Father   . Hypertension Brother   . Melanoma Brother   . Allergies Daughter   . Throat cancer Maternal Uncle   . Colon cancer Paternal Grandfather   . Prostate cancer Neg Hx      HOME MEDICATIONS: Allergies as of 07/27/2018   No Known Allergies     Medication List       Accurate as of July 27, 2018 11:59 PM. Always use your most recent med list.        allopurinol 300 MG tablet Commonly known as:  ZYLOPRIM Take 1 tablet (300 mg total) by mouth daily.   atorvastatin 10 MG tablet Commonly known as:  LIPITOR TAKE 1 TABLET(10 MG) BY MOUTH DAILY   docusate sodium 100 MG capsule Commonly known as:  COLACE Take 100 mg by mouth daily.   famotidine 20 MG tablet Commonly known as:  PEPCID Take 1 tablet (20 mg total) by mouth at bedtime.   fenofibrate 160 MG tablet Take 1  tablet (160 mg total) by mouth daily.   FREESTYLE LIBRE 14 DAY SENSOR Misc 1 Units by Does not apply route every 14 (fourteen) days.   glucose blood test strip Commonly known as:  ONETOUCH VERIO Use as instructed to check blood sugar 3 times daily.   Icosapent Ethyl 1 g Caps Commonly known as:  VASCEPA Take 2 capsules (2 g total) by mouth 2 (two) times daily. TAKE 2 CAPSULES BY MOUTH TWICE DAILY.   INVOKANA 300 MG Tabs tablet Generic drug:  canagliflozin TAKE 1 TABLET BY MOUTH DAILY BEFORE BREAKFAST   levothyroxine 175 MCG tablet Commonly known as:  SYNTHROID, LEVOTHROID TAKE 1 TABLET(175 MCG) BY MOUTH DAILY   lisinopril 5 MG tablet Commonly known as:  PRINIVIL,ZESTRIL TAKE ONE TABLET BY MOUTH DAILY   metFORMIN 1000 MG tablet Commonly known as:  GLUCOPHAGE TAKE ONE TABLET BY MOUTH TWICE DAILY WITH MEALS   metoprolol succinate 25 MG 24 hr tablet Commonly known  as:  TOPROL-XL Take 1 tablet (25 mg total) by mouth daily.   multivitamin tablet Take 1 tablet by mouth daily.   ONETOUCH DELICA PLUS ATFTDD22G Misc USE LANCETS TO CHECK BLOOD SUGAR THREE TIMES DAILY   rivaroxaban 20 MG Tabs tablet Commonly known as:  XARELTO Take 1 tablet (20 mg total) by mouth daily with supper.   TRULICITY 1.5 UR/4.2HC Sopn Generic drug:  Dulaglutide INJECT THE CONTENTS OF 1 PEN UNDER THE SKIN ONCE WEEKLY AS DIRECTED   VITAMIN C PO Take 2 tablets by mouth 2 (two) times daily.   Vitamin D (Ergocalciferol) 1.25 MG (50000 UT) Caps capsule Commonly known as:  DRISDOL TAKE 1 CAPSULE BY MOUTH EVERY 7 DAYS        OBJECTIVE:   Vital Signs: BP 118/78 (BP Location: Left Arm, Patient Position: Sitting, Cuff Size: Normal)   Pulse 81   Ht 6\' 1"  (1.854 m)   Wt 235 lb (106.6 kg)   SpO2 96%   BMI 31.00 kg/m   Wt Readings from Last 3 Encounters:  07/27/18 235 lb (106.6 kg)  06/20/18 236 lb (107 kg)  05/18/18 236 lb (107 kg)     Exam: General: Pt appears well and is in NAD  Neck:  General: Supple without adenopathy. Thyroid: Thyroid size normal.  No goiter or nodules appreciated. No thyroid bruit.  Lungs: Clear with good BS bilat with no rales, rhonchi, or wheezes  Heart: RRR with normal S1 and S2 and no gallops; no murmurs; no rub  Extremities: No pretibial edema. No tremor.  Skin: Normal texture and temperature to palpation. No rash noted. No Acanthosis nigricans/skin tags. No lipohypertrophy.  Neuro: MS is good with appropriate affect, pt is alert and Ox3      DATA REVIEWED:  Lab Results  Component Value Date   HGBA1C 6.5 (A) 07/27/2018   HGBA1C 5.9 (A) 03/17/2018   HGBA1C 6.2 (H) 12/07/2017   Lab Results  Component Value Date   LDLCALC 95 07/20/2018   CREATININE 1.16 07/20/2018   Lab Results  Component Value Date   MICRALBCREAT 5.9 04/08/2015      Lab Results  Component Value Date   CHOL 173 07/20/2018   HDL 49 07/20/2018   LDLCALC 95 07/20/2018   TRIG 147 07/20/2018   CHOLHDL 3.5 07/20/2018         ASSESSMENT / PLAN / RECOMMENDATIONS:   1) Type 2 diabetes Mellitus, optimally controlled, With out complications - Most recent A1c of 6.5 %. Goal A1c <7.0 %.  Plan:  -His A1c indicates optimally controlled diabetes, patient does admit to dietary indiscretions for the past few months. -We have discussed the importance of low carbohydrate diet, to avoid continued hypoglycemia and worsening of his A1c. -We will not be making any changes to his medication regimen at this point. -Patient does have abdominal bloating, that he attributes to carbohydrate intake, we also discussed that Trulicity and metformin both could cause GI side effects, patient does not want to make any changes at this point because of his good glucose control, but he will work on lifestyle changes and reducing his carbohydrate to see if that helps.  MEDICATIONS: - Continue Metformin 1000 mg Twice a day with meal s - Continue Trulicity 1.5 mg weekly  - Continue Invokana 300  mg daily   EDUCATION / INSTRUCTIONS:  BG monitoring instructions: Patient is instructed to check his blood sugars 2 times a day, fasting and bedtime.  Call [facility] if: BG persistently <  70 or > 300. . I reviewed the Rule of 15 for the treatment of hypoglycemia in detail with the patient. Literature supplied.   2) Diabetic complications:   Eye: Does note have known diabetic retinopathy.   Neuro/ Feet: Does note have known diabetic peripheral neuropathy .   Renal: Patient does not have known baseline CKD. He   is on an ACEI/ARB at present.   3) Lipids: Patient is  on a statin.  He was also prescribed VASCEPA  for hypertriglyceridemia, patient is concerned about taking this due to concerns of A. fib.  Patient brought a pamphlet discussing the risk of cardiac arrhythmia with Lovaza.  Patient is on omega-3, he was advised to continue this for now until he discusses his concerns with his primary endocrinologist.   4) Hypertension: He is at goal of < 140/90 mmHg.   5) Indigestion :  -Patient attributes his bloating to increased carbohydrate intake, I have advised him to reduce his carbohydrate intake to also help with glycemic control, we also discussed the use of probiotics as an option.  We also discussed avoiding sugar alcohols that could sometimes be in certain sugar-free desserts as they could also cause explosive diarrhea and bloating. -I have a discussed with him that Trulicity and metformin both could cause GI side effects, but he will start with the above regimens first and if that does not work could experiment with adjusting his metformin or Trulicity dose.    6) GERD:  -Patient has had this on and off for years, he used to be on Dexilant in the past. -Patient advised to use OTC PPIs, and if this does not help he will need to discuss this with his PCP.   7)Hypothyroidism : -Patient is clinically and biochemically euthyroid -Continue levothyroxine 175 MCG  daily - - Pt educated extensively on the correct way to take levothyroxine (first thing in the morning with water, 30 minutes before eating or taking other medications). - Pt encouraged to double dose the following day if she were to miss a dose given long half-life of levothyroxine.   f/U in 4 months   Signed electronically by: Mack Guise, MD  [facility]  [facility] [address] [place], [place] [address] Phone: [phone] FAX: [phone]   CC: Colon Branch, [address] [place] [address] Phone: [phone]  Fax: [phone]  Return to Endocrinology clinic as below: Future Appointments  Date Time Provider [place]  10/16/2018  4:15 PM Gray Grayer, MD [facility]  11/24/2018  8:00 AM Elayne Snare, MD [facility] None  04/18/2019  8:00 AM Colon Branch, MD [facility]

## 2018-08-08 DIAGNOSIS — E11319 Type 2 diabetes mellitus with unspecified diabetic retinopathy without macular edema: Secondary | ICD-10-CM | POA: Diagnosis not present

## 2018-08-08 LAB — HM DIABETES EYE EXAM

## 2018-08-14 ENCOUNTER — Encounter: Payer: Self-pay | Admitting: Internal Medicine

## 2018-09-12 ENCOUNTER — Other Ambulatory Visit: Payer: Self-pay | Admitting: Endocrinology

## 2018-09-12 DIAGNOSIS — E559 Vitamin D deficiency, unspecified: Secondary | ICD-10-CM

## 2018-09-17 ENCOUNTER — Other Ambulatory Visit: Payer: Self-pay | Admitting: Endocrinology

## 2018-09-20 ENCOUNTER — Other Ambulatory Visit: Payer: Self-pay

## 2018-09-20 ENCOUNTER — Telehealth: Payer: Self-pay | Admitting: Endocrinology

## 2018-09-20 MED ORDER — ICOSAPENT ETHYL 1 G PO CAPS: 2.0000 | ORAL_CAPSULE | Freq: Two times a day (BID) | ORAL | 3 refills | Status: DC

## 2018-09-20 NOTE — Telephone Encounter (Signed)
Cover My Meds  Has called in regards to a prior auth for a patient, confirmed fax number [phone]. Refaxing today.

## 2018-10-11 ENCOUNTER — Telehealth: Payer: Self-pay

## 2018-10-12 ENCOUNTER — Other Ambulatory Visit: Payer: Self-pay | Admitting: Endocrinology

## 2018-10-12 ENCOUNTER — Other Ambulatory Visit: Payer: Self-pay | Admitting: Internal Medicine

## 2018-10-14 ENCOUNTER — Other Ambulatory Visit: Payer: Self-pay | Admitting: Internal Medicine

## 2018-10-16 ENCOUNTER — Encounter: Payer: Self-pay | Admitting: Internal Medicine

## 2018-10-16 ENCOUNTER — Telehealth: Payer: Self-pay

## 2018-10-16 ENCOUNTER — Telehealth (INDEPENDENT_AMBULATORY_CARE_PROVIDER_SITE_OTHER): Payer: 59 | Admitting: Internal Medicine

## 2018-10-16 VITALS — BP 128/70 | HR 65 | Ht 73.0 in | Wt 234.2 lb

## 2018-10-16 DIAGNOSIS — Z8679 Personal history of other diseases of the circulatory system: Secondary | ICD-10-CM

## 2018-10-16 DIAGNOSIS — G4733 Obstructive sleep apnea (adult) (pediatric): Secondary | ICD-10-CM

## 2018-10-16 DIAGNOSIS — Z9889 Other specified postprocedural states: Secondary | ICD-10-CM | POA: Diagnosis not present

## 2018-10-16 DIAGNOSIS — Z6830 Body mass index (BMI) 30.0-30.9, adult: Secondary | ICD-10-CM

## 2018-10-16 DIAGNOSIS — E6609 Other obesity due to excess calories: Secondary | ICD-10-CM

## 2018-10-16 NOTE — Progress Notes (Signed)
Electrophysiology TeleHealth Note   Due to national recommendations of social distancing due to COVID 19, an audio/video telehealth visit is felt to be most appropriate for this patient at this time.  See MyChart message from today for the patient's consent to telehealth for [facility].   Date:  10/16/2018   ID:  Colin Wood, DOB Sep 02, 1960, MRN [id number]  Location: patient's home  Provider location: [address], [place]  Evaluation Performed: Follow-up visit  PCP:  Colon Branch, MD   Electrophysiologist:  [name]  Chief Complaint:  afib  History of Present Illness:    KELTON BULTMAN is a 58 y.o. male who presents via audio/video conferencing for a telehealth visit today.  Since last being seen in our clinic, the patient reports doing very well.  Today, he denies symptoms of palpitations, chest pain, shortness of breath,  lower extremity edema, dizziness, presyncope, or syncope.  He has gained some weight back.  He recognizes that he needs to work on weight loss again.  The patient is otherwise without complaint today.  The patient denies symptoms of fevers, chills, cough, or new SOB worrisome for COVID 19.  Past Medical History:  Diagnosis Date  . Anxiety   . Atrial flutter ([place])    typical appearing; s/p ablation 12-2012  . Constipation   . Depression    used to see psych, on no meds as of 03/2009  . Diabetes mellitus    TYPE 2  . Fatty liver   . GERD (gastroesophageal reflux disease)   . Gout   . Hyperlipidemia   . Hypothyroidism   . Leg edema   . Obesity   . Persistent atrial fibrillation    PVI 12/2012  . Prediabetes   . Rhinitis    vasomotor  . S/P Minimally invasive maze operation for atrial fibrillation 03/12/2016   Complete bilateral atrial lesion set using cryothermy and bipolar radiofrequency ablation with clipping of LA appendage via right mini thoracotomy approach  . S/P patent foramen ovale closure 03/12/2016  . Sleep  apnea    mild, now treated with CPAP since ~9-12    Past Surgical History:  Procedure Laterality Date  . ABLATION OF DYSRHYTHMIC FOCUS  01/05/2013   PVI and flutter ablation by [name]  . ATRIAL FIBRILLATION ABLATION N/A 01/05/2013   Procedure: ATRIAL FIBRILLATION ABLATION;  Surgeon: Jankovich Grayer, MD;  Location: [facility] CATH LAB;  Service: Cardiovascular;  Laterality: N/A;  . CARDIAC CATHETERIZATION N/A 01/12/2016   Procedure: Right/Left Heart Cath and Coronary Angiography;  Surgeon: [name], MD;  Location: [facility] CV LAB;  Service: Cardiovascular;  Laterality: N/A;  . CARDIOVERSION  03/01/11  . CLIPPING OF ATRIAL APPENDAGE Left 03/12/2016   Procedure: CLIPPING OF ATRIAL APPENDAGE;  Surgeon: [name], MD;  Location: [facility];  Service: Open Heart Surgery;  Laterality: Left;  . MINIMALLY INVASIVE MAZE PROCEDURE N/A 03/12/2016   Procedure: MINIMALLY INVASIVE MAZE PROCEDURE;  Surgeon: [name], MD;  Location: [facility];  Service: Open Heart Surgery;  Laterality: N/A;  . NASAL SINUS SURGERY    . TEE WITHOUT CARDIOVERSION N/A 01/04/2013   Procedure: TRANSESOPHAGEAL ECHOCARDIOGRAM (TEE);  Surgeon: [name], MD;  Location: [facility] ENDOSCOPY;  Service: Cardiovascular;  Laterality: N/A;  . TEE WITHOUT CARDIOVERSION N/A 03/12/2016   Procedure: TRANSESOPHAGEAL ECHOCARDIOGRAM (TEE);  Surgeon: [name], MD;  Location: [facility];  Service: Open Heart Surgery;  Laterality: N/A;  . TONSILLECTOMY    .  VASECTOMY      Current Outpatient Medications  Medication Sig Dispense Refill  . allopurinol (ZYLOPRIM) 300 MG tablet Take 1 tablet (300 mg total) by mouth daily. 90 tablet 3  . Ascorbic Acid (VITAMIN C PO) Take 2 tablets by mouth 2 (two) times daily.    [name] atorvastatin (LIPITOR) 10 MG tablet TAKE 1 TABLET(10 MG) BY MOUTH DAILY 90 tablet 0  . Continuous Blood Gluc Sensor (FREESTYLE LIBRE 14 DAY SENSOR) MISC 1 Units by Does not apply route every 14 (fourteen) days. 2 each 4  . docusate sodium  (COLACE) 100 MG capsule Take 100 mg by mouth daily.    . famotidine (PEPCID) 20 MG tablet Take 1 tablet (20 mg total) by mouth at bedtime. 90 tablet 3  . fenofibrate 160 MG tablet Take 1 tablet (160 mg total) by mouth daily. 90 tablet 3  . glucose blood (ONETOUCH VERIO) test strip Use as instructed to check blood sugar 3 times daily. 100 each 3  . Icosapent Ethyl (VASCEPA) 1 g CAPS Take 2 capsules (2 g total) by mouth 2 (two) times daily. TAKE 2 CAPSULES BY MOUTH TWICE DAILY. 360 capsule 3  . INVOKANA 300 MG TABS tablet TAKE 1 TABLET BY MOUTH DAILY BEFORE BREAKFAST 90 tablet 0  . Lancets (ONETOUCH DELICA PLUS [id number]) MISC USE LANCETS TO CHECK BLOOD SUGAR THREE TIMES DAILY 100 each 3  . levothyroxine (SYNTHROID) 175 MCG tablet TAKE 1 TABLET BY MOUTH EVERY DAY 90 tablet 0  . lisinopril (ZESTRIL) 5 MG tablet Take 1 tablet (5 mg total) by mouth daily. Please keep upcoming appt in May for further refills 90 tablet 0  . metFORMIN (GLUCOPHAGE) 1000 MG tablet TAKE ONE TABLET BY MOUTH TWICE DAILY WITH MEALS 180 tablet 1  . metoprolol succinate (TOPROL-XL) 25 MG 24 hr tablet Take 1 tablet (25 mg total) by mouth daily. Please keep upcoming appt in May for further refills 90 tablet 0  . Multiple Vitamin (MULTIVITAMIN) tablet Take 1 tablet by mouth daily.    . TRULICITY 1.5 HU/3.1SH SOPN INJECT THE CONTENTS OF 1 PEN UNDER THE SKIN ONCE WEEKLY AS DIRECTED 6 mL 0  . Vitamin D, Ergocalciferol, (DRISDOL) 1.25 MG (50000 UT) CAPS capsule TAKE 1 CAPSULE BY MOUTH EVERY 7 DAYS 12 capsule 0  . XARELTO 20 MG TABS tablet TAKE 1 TABLET(20 MG) BY MOUTH DAILY WITH SUPPER 90 tablet 3   No current facility-administered medications for this visit.     Allergies:   Patient has no known allergies.   Social History:  The patient  reports that he has never smoked. He has never used smokeless tobacco. He reports that he does not drink alcohol or use drugs.   Family History:  The patient's family history includes Allergies in  his daughter; Asthma in his mother; Atrial fibrillation in his father; Breast cancer in his mother; Colon cancer in his paternal grandfather; Dementia in his mother; Diabetes in his father; Hyperlipidemia in his father; Hypertension in his brother; Melanoma in his brother; Obesity in his father; Throat cancer in his maternal uncle.   ROS:  Please see the history of present illness.   All other systems are personally reviewed and negative.    Exam:    Vital Signs:  BP 128/70   Pulse 65   Ht 6\' 1"  (1.854 m)   Wt 234 lb 3.2 oz (106.2 kg)   SpO2 99%   BMI 30.90 kg/m   Well appearing, alert and conversant, regular work of  breathing,  good skin color Eyes- anicteric, neuro- grossly intact, skin- no apparent rash or lesions or cyanosis, mouth- oral mucosa is pink   Labs/Other Tests and Data Reviewed:    Recent Labs: 07/20/2018: ALT 26; BUN 19; Creatinine, Ser 1.16; Potassium 4.8; Sodium 140; TSH 1.130   Wt Readings from Last 3 Encounters:  10/16/18 234 lb 3.2 oz (106.2 kg)  07/27/18 235 lb (106.6 kg)  06/20/18 236 lb (107 kg)     Other studies personally reviewed: Additional studies/ records that were reviewed today include: my prior notes,  AF clinic notes  Review of the above records today demonstrates: as above    ASSESSMENT & PLAN:    1.  Persistent atrial fibrillation s/p MAZE Doing very well No changes today On xarelto  2. [name] Does not use his CPAP He wants to try to lose weight again  3. Overweight Lifestyle modification encouraged today  4. Tachycardia mediated CM On lisinopril EF has normalized with sinus  5. COVID 19 screen The patient denies symptoms of COVID 19 at this time.  The importance of social distancing was discussed today.  Follow-up:  AF clinic every 6 months  Current medicines are reviewed at length with the patient today.   The patient does not have concerns regarding his medicines.  The following changes were made today:  none  Labs/ tests  ordered today include:  No orders of the defined types were placed in this encounter.  Patient Risk:  after full review of this patients clinical status, I feel that they are at moderate risk at this time.  Today, I have spent 15 minutes with the patient with telehealth technology discussing afib  Signed, Buehner Grayer, MD  10/16/2018 3:43 PM     [facility] [address] [place] [address] [phone] (office) [phone] (fax)

## 2018-10-16 NOTE — Telephone Encounter (Signed)
Follow Up    Pt Is calling back  He said he is good to go for his appt     Please call back

## 2018-10-16 NOTE — Telephone Encounter (Signed)
Left message regarding appt on 10/16/18. 

## 2018-11-09 ENCOUNTER — Other Ambulatory Visit: Payer: Self-pay

## 2018-11-09 DIAGNOSIS — E063 Autoimmune thyroiditis: Secondary | ICD-10-CM

## 2018-11-09 DIAGNOSIS — E1165 Type 2 diabetes mellitus with hyperglycemia: Secondary | ICD-10-CM

## 2018-11-09 DIAGNOSIS — E782 Mixed hyperlipidemia: Secondary | ICD-10-CM

## 2018-11-17 LAB — LIPID PANEL

## 2018-11-18 LAB — COMPREHENSIVE METABOLIC PANEL
ALT: 21 IU/L (ref 0–44)
AST: 19 IU/L (ref 0–40)
Albumin/Globulin Ratio: 2.7 — ABNORMAL HIGH (ref 1.2–2.2)
Albumin: 4.9 g/dL (ref 3.8–4.9)
Alkaline Phosphatase: 43 IU/L (ref 39–117)
BUN/Creatinine Ratio: 17 (ref 9–20)
BUN: 19 mg/dL (ref 6–24)
Bilirubin Total: 0.6 mg/dL (ref 0.0–1.2)
CO2: 23 mmol/L (ref 20–29)
Calcium: 10.3 mg/dL — ABNORMAL HIGH (ref 8.7–10.2)
Chloride: 99 mmol/L (ref 96–106)
Creatinine, Ser: 1.09 mg/dL (ref 0.76–1.27)
GFR calc Af Amer: 85 mL/min/{1.73_m2} (ref >59)
GFR calc non Af Amer: 74 mL/min/{1.73_m2} (ref >59)
Globulin, Total: 1.8 g/dL (ref 1.5–4.5)
Glucose: 124 mg/dL — ABNORMAL HIGH (ref 65–99)
Potassium: 4.5 mmol/L (ref 3.5–5.2)
Sodium: 138 mmol/L (ref 134–144)
Total Protein: 6.7 g/dL (ref 6.0–8.5)

## 2018-11-18 LAB — LIPID PANEL
Chol/HDL Ratio: 3.4 ratio (ref 0.0–5.0)
Cholesterol, Total: 151 mg/dL (ref 100–199)
HDL: 45 mg/dL (ref >39)
LDL Calculated: 77 mg/dL (ref 0–99)
Triglycerides: 144 mg/dL (ref 0–149)
VLDL Cholesterol Cal: 29 mg/dL (ref 5–40)

## 2018-11-18 LAB — HEMOGLOBIN A1C
Est. average glucose Bld gHb Est-mCnc: 126 mg/dL
Hgb A1c MFr Bld: 6 % — ABNORMAL HIGH (ref 4.8–5.6)

## 2018-11-18 LAB — MICROALBUMIN / CREATININE URINE RATIO
Creatinine, Urine: 87 mg/dL
Microalb/Creat Ratio: <3 mg/g creat (ref 0–29)
Microalbumin, Urine: <3 ug/mL

## 2018-11-18 LAB — T4, FREE: Free T4: 1.86 ng/dL — ABNORMAL HIGH (ref 0.82–1.77)

## 2018-11-18 LAB — TSH: TSH: 0.258 u[IU]/mL — ABNORMAL LOW (ref 0.450–4.500)

## 2018-11-23 ENCOUNTER — Other Ambulatory Visit: Payer: Self-pay

## 2018-11-24 ENCOUNTER — Other Ambulatory Visit: Payer: Self-pay | Admitting: Endocrinology

## 2018-11-24 ENCOUNTER — Ambulatory Visit: Payer: 59 | Admitting: Endocrinology

## 2018-11-24 VITALS — BP 148/70 | HR 86 | Temp 98.3°F | Ht 73.0 in | Wt 233.8 lb

## 2018-11-24 DIAGNOSIS — E782 Mixed hyperlipidemia: Secondary | ICD-10-CM

## 2018-11-24 DIAGNOSIS — E669 Obesity, unspecified: Secondary | ICD-10-CM | POA: Diagnosis not present

## 2018-11-24 DIAGNOSIS — E063 Autoimmune thyroiditis: Secondary | ICD-10-CM

## 2018-11-24 DIAGNOSIS — E213 Hyperparathyroidism, unspecified: Secondary | ICD-10-CM | POA: Diagnosis not present

## 2018-11-24 DIAGNOSIS — E1169 Type 2 diabetes mellitus with other specified complication: Secondary | ICD-10-CM | POA: Diagnosis not present

## 2018-11-24 NOTE — Patient Instructions (Addendum)
No thyroid pill on Sunday  Check blood sugars on waking up days a week  Also check blood sugars about 2 hours after meals and do this after different meals by rotation  Recommended blood sugar levels on waking up are 90-130 and about 2 hours after meal is 130-160  Please bring your blood sugar monitor to each visit, thank you

## 2018-11-24 NOTE — Progress Notes (Signed)
Patient ID: Colin Wood, male   DOB: [birth year], 58 y.o.   MRN: [id number]           Reason for Appointment: Follow-up for Type 2 Diabetes  Referring physician: Larose Kells  History of Present Illness:          Diagnosis: Type 2 diabetes mellitus, date of diagnosis: ?  2011        Past history: He was not having any symptoms at the time of diagnosis and not clear what his initial blood sugars were He was started on metformin initially and had fairly good control Subsequently with higher sugars he was given Amaryl also in addition which has been continued.  A1c was 10.6% in 2012 Subsequently in 2014 his A1c was down to 6.5 but he had a regular follow-up subsequently Because of an A1c of 8.1 in 02/2014 he was given Tradjenta in addition to the above drugs but this was not effective  He was referred here because of a high A1c of 7.9 in 07/2014  Recent history:   Oral hypoglycemic drugs the patient is taking are: metformin 1 g twice a day, Trulicity 1.5 mg weekly, Invokana 300 mg daily      A1c has been excellent and now at  6%  Current management, blood sugar values and problems identified:  He was last seen as an endocrinology patient in February  He is continuing the same treatment regimen as before  Has maintained his weight  He is not doing any formal exercise but mostly doing projects at home  He is occasionally going off his diet with eating fast food breakfast but otherwise still trying to overall watch his diet  Unable to download his meter but most of his blood sugars are looking fairly good and lab fasting glucose was 124, tends to have slightly high readings fasting         Side effects from medications have been:None  Compliance with the medical regimen: Fair   Glucose monitoring:  done less than once a day       Glucometer:   One Touch.      Blood Glucose readings by review of monitor :   Fasting range 114-130  NONFASTING highest 155 after breakfast  AVERAGE  135  Self-care: The diet that the patient has been following is: None, he has difficulty controlling portions and carbohydrates at times    Breakfast is frequently an egg and meat English muffin at a fast food restaurant Will snack on apples and yogurt           Exercise: Minimal programmed exercise           Dietician visit, most recent: none, previously had gone to a class          Weight history: Previous range 260-280  Wt Readings from Last 3 Encounters:  10/16/18 234 lb 3.2 oz (106.2 kg)  07/27/18 235 lb (106.6 kg)  06/20/18 236 lb (107 kg)    Glycemic control:   Lab Results  Component Value Date   HGBA1C 6.0 (H) 11/17/2018   HGBA1C 6.5 (A) 07/27/2018   HGBA1C 5.9 (A) 03/17/2018   Lab Results  Component Value Date   LDLCALC 77 11/17/2018   CREATININE 1.09 11/17/2018      Allergies as of 11/24/2018   No Known Allergies     Medication List       Accurate as of November 24, 2018  9:35 AM. If you have any questions, ask your  nurse or doctor.        STOP taking these medications   FreeStyle Libre 14 Day Sensor Misc Stopped by: Elayne Snare, MD     TAKE these medications   allopurinol 300 MG tablet Commonly known as: ZYLOPRIM Take 1 tablet (300 mg total) by mouth daily.   atorvastatin 10 MG tablet Commonly known as: LIPITOR TAKE 1 TABLET(10 MG) BY MOUTH DAILY   docusate sodium 100 MG capsule Commonly known as: COLACE Take 100 mg by mouth daily.   famotidine 20 MG tablet Commonly known as: PEPCID Take 1 tablet (20 mg total) by mouth at bedtime.   fenofibrate 160 MG tablet Take 1 tablet (160 mg total) by mouth daily.   glucose blood test strip Commonly known as: OneTouch Verio Use as instructed to check blood sugar 3 times daily.   Icosapent Ethyl 1 g Caps Commonly known as: Vascepa Take 2 capsules (2 g total) by mouth 2 (two) times daily. TAKE 2 CAPSULES BY MOUTH TWICE DAILY.   Invokana 300 MG Tabs tablet Generic drug: canagliflozin TAKE 1  TABLET BY MOUTH DAILY BEFORE BREAKFAST   levothyroxine 175 MCG tablet Commonly known as: SYNTHROID TAKE 1 TABLET BY MOUTH EVERY DAY   lisinopril 5 MG tablet Commonly known as: ZESTRIL Take 1 tablet (5 mg total) by mouth daily. Please keep upcoming appt in May for further refills   metFORMIN 1000 MG tablet Commonly known as: GLUCOPHAGE TAKE 1 TABLET BY MOUTH TWICE DAILY WITH MEALS What changed:   how much to take  how to take this  when to take this  additional instructions Changed by: Elayne Snare, MD   metoprolol succinate 25 MG 24 hr tablet Commonly known as: TOPROL-XL Take 1 tablet (25 mg total) by mouth daily. Please keep upcoming appt in May for further refills   multivitamin tablet Take 1 tablet by mouth daily.   OneTouch Delica Plus [id number] Misc USE LANCETS TO CHECK BLOOD SUGAR THREE TIMES DAILY   Trulicity 1.5 EY/8.1KG Sopn Generic drug: Dulaglutide INJECT THE CONTENTS OF 1 PEN UNDER THE SKIN ONCE WEEKLY AS DIRECTED   VITAMIN C PO Take 2 tablets by mouth 2 (two) times daily.   Vitamin D (Ergocalciferol) 1.25 MG (50000 UT) Caps capsule Commonly known as: DRISDOL TAKE 1 CAPSULE BY MOUTH EVERY 7 DAYS   Xarelto 20 MG Tabs tablet Generic drug: rivaroxaban TAKE 1 TABLET(20 MG) BY MOUTH DAILY WITH SUPPER       Allergies:  No Known Allergies  Past Medical History:  Diagnosis Date  . Anxiety   . Atrial flutter ([place])    typical appearing; s/p ablation 12-2012  . Constipation   . Depression    used to see psych, on no meds as of 03/2009  . Diabetes mellitus    TYPE 2  . Fatty liver   . GERD (gastroesophageal reflux disease)   . Gout   . Hyperlipidemia   . Hypothyroidism   . Leg edema   . Obesity   . Persistent atrial fibrillation    PVI 12/2012  . Prediabetes   . Rhinitis    vasomotor  . S/P Minimally invasive maze operation for atrial fibrillation 03/12/2016   Complete bilateral atrial lesion set using cryothermy and bipolar radiofrequency  ablation with clipping of LA appendage via right mini thoracotomy approach  . S/P patent foramen ovale closure 03/12/2016  . Sleep apnea    mild, now treated with CPAP since ~9-12    Past Surgical History:  Procedure  Laterality Date  . ABLATION OF DYSRHYTHMIC FOCUS  01/05/2013   PVI and flutter ablation by [name]  . ATRIAL FIBRILLATION ABLATION N/A 01/05/2013   Procedure: ATRIAL FIBRILLATION ABLATION;  Surgeon: Werber Grayer, MD;  Location: [facility] CATH LAB;  Service: Cardiovascular;  Laterality: N/A;  . CARDIAC CATHETERIZATION N/A 01/12/2016   Procedure: Right/Left Heart Cath and Coronary Angiography;  Surgeon: [name], MD;  Location: [facility] CV LAB;  Service: Cardiovascular;  Laterality: N/A;  . CARDIOVERSION  03/01/11  . CLIPPING OF ATRIAL APPENDAGE Left 03/12/2016   Procedure: CLIPPING OF ATRIAL APPENDAGE;  Surgeon: [name], MD;  Location: [place];  Service: Open Heart Surgery;  Laterality: Left;  . MINIMALLY INVASIVE MAZE PROCEDURE N/A 03/12/2016   Procedure: MINIMALLY INVASIVE MAZE PROCEDURE;  Surgeon: [name], MD;  Location: [facility];  Service: Open Heart Surgery;  Laterality: N/A;  . NASAL SINUS SURGERY    . TEE WITHOUT CARDIOVERSION N/A 01/04/2013   Procedure: TRANSESOPHAGEAL ECHOCARDIOGRAM (TEE);  Surgeon: [name], MD;  Location: [facility] ENDOSCOPY;  Service: Cardiovascular;  Laterality: N/A;  . TEE WITHOUT CARDIOVERSION N/A 03/12/2016   Procedure: TRANSESOPHAGEAL ECHOCARDIOGRAM (TEE);  Surgeon: [name], MD;  Location: [facility];  Service: Open Heart Surgery;  Laterality: N/A;  . TONSILLECTOMY    . VASECTOMY      Family History  Problem Relation Age of Onset  . Asthma Mother   . Breast cancer Mother   . Dementia Mother   . Diabetes Father   . Hyperlipidemia Father   . Obesity Father   . Atrial fibrillation Father   . Hypertension Brother   . Melanoma Brother   . Allergies Daughter   . Throat cancer Maternal Uncle   . Colon cancer Paternal  Grandfather   . Prostate cancer Neg Hx     Social History:  reports that he has never smoked. He has never used smokeless tobacco. He reports that he does not drink alcohol or use drugs.    Review of Systems         Lipids:  has high triglycerides mostly previously as high as 681; LDL previously also consistently high.   On Fenofibrate and taking Lipitor 10 mg daily  His LDL is well controlled and now triglycerides are back to normal  Overall diet is fairly good        Lab Results  Component Value Date   CHOL 151 11/17/2018   HDL 45 11/17/2018   LDLCALC 77 11/17/2018   TRIG 144 11/17/2018   CHOLHDL 3.4 11/17/2018                   Thyroid: He was diagnosed to have hypothyroidism in 1994  He is compliant with his generic levothyroxine every morning daily  Has been on levothyroxine 175 mcg, 6.5 tablets per week His TSH is again low even though it was back to normal in February Not clear why his free T4 is consistently high, his only vitamin is Leisure centre [employer]  Component Value Date   TSH 0.258 (L) 11/17/2018   TSH 1.130 07/20/2018   TSH 0.221 (L) 03/29/2018   FREET4 1.86 (H) 11/17/2018   FREET4 1.85 (H) 08/05/2017   FREET4 1.93 (H) 02/10/2017       The blood pressure has been treated by cardiologist with low-dose lisinopril and metoprolol   BP Readings from Last 3 Encounters:  10/16/18 128/70  07/27/18 118/78  06/20/18 118/73   Has been prescribed  vitamin D weekly, last level was normal  HYPERCALCEMIA: This is a new finding on the last year his calcium was upper normal No history of kidney stones or known osteopenia  Lab Results  Component Value Date   CALCIUM 10.3 (H) 11/17/2018   CALCIUM 10.4 (H) 07/20/2018   CALCIUM 10.1 03/29/2018   CALCIUM 10.0 12/07/2017     Previously was having decreased libido and motivation and testosterone level was normal in 2019   Physical Examination:  There were no vitals taken for this visit.      ASSESSMENT:  Diabetes type 2, with mild obesity  He is on a 3 drug regimen of metformin, Trulicity 1.5 mg and Invokana 300 mg  See history of present illness for discussion of his current management, blood sugar patterns and problems identified.    His A1c is 6%  Although his blood sugars are averaging 135 he does not have any significant hypoglycemia even after meals, fasting readings are upper end of target  Does need consistent exercise and diet  Discussed blood sugar targets and timing of testing  No evidence of complication, microalbumin is normal  HYPERTRIGLYCERIDEMIA: Labs improved, needs consistent diet and the same regimen of medication  HYPOTHYROIDISM: TSH needs to be rechecked also  FATIGUE and decreased motivation, mild depression and decreased libido: We will check testosterone Discussed potential treatment with testosterone supplementation and possible side effects  PLAN:  Check PTH level Discussed hyperparathyroidism and since he is likely having only mild disease and asymptomatic not a surgical candidate at his age Also given him patient handout on this  Discussed regular exercise and he thinks he can start walking when he is finished with his home projects Try to minimize high fat meals and fast food Currently no change in medication required although if he has tendency to higher readings or weight gain will switch him to Ozempic which is this year covered For his thyroid he needs to take 175 mcg, 6 days a week only and next prescription should be 150 mcg once a day Consistent blood sugar monitoring after meals  Follow-up in 4 months  Patient Instructions  No thyroid pill on Sunday  Check blood sugars on waking up days a week  Also check blood sugars about 2 hours after meals and do this after different meals by rotation  Recommended blood sugar levels on waking up are 90-130 and about 2 hours after meal is 130-160  Please bring your blood sugar  monitor to each visit, thank you    Total visit time for evaluation and management of multiple problems and counseling =25 minutes   Elayne Snare 11/24/2018, 9:35 AM   Note: This office note was prepared with Dragon voice recognition system technology. Any transcriptional errors that result from this process are unintentional.

## 2018-11-25 LAB — PARATHYROID HORMONE, INTACT (NO CA): PTH: 15 pg/mL (ref 15–65)

## 2018-12-04 ENCOUNTER — Other Ambulatory Visit: Payer: Self-pay | Admitting: Endocrinology

## 2018-12-04 DIAGNOSIS — E559 Vitamin D deficiency, unspecified: Secondary | ICD-10-CM

## 2018-12-22 ENCOUNTER — Other Ambulatory Visit: Payer: Self-pay

## 2018-12-22 MED ORDER — ATORVASTATIN CALCIUM 10 MG PO TABS: ORAL_TABLET | ORAL | 0 refills | Status: DC

## 2019-01-08 ENCOUNTER — Other Ambulatory Visit: Payer: Self-pay | Admitting: Endocrinology

## 2019-01-08 NOTE — Telephone Encounter (Signed)
Please advise if you wish to refill. Pt has not had labs obtained according to your request from [place]

## 2019-01-09 ENCOUNTER — Other Ambulatory Visit: Payer: Self-pay | Admitting: Endocrinology

## 2019-01-09 ENCOUNTER — Telehealth: Payer: Self-pay

## 2019-01-09 NOTE — Telephone Encounter (Signed)
PA initiated today through Cover My Meds for Invokana. Will await insurance response re: approval/denial.  Winfred Leeds Key: [id number] - PA Case ID: [id number] - Rx #: [id number] Need help? Call us at [phone] Status Sent to [place] 300MG  tablets Form [employer] Electronic Prior Authorization Form (2017 NCPDP) Original Claim Info 62

## 2019-01-09 NOTE — Telephone Encounter (Signed)
He will change to Jardiance 25 mg daily

## 2019-01-09 NOTE — Telephone Encounter (Signed)
Received notification from [facility] that PA for [place] has been denied. Following reasons provided: Must have tried and failed the following 2 drugs: [place] and Jardiance. Letter placed on Dr. Ronnie Derby desk for him to review and address.

## 2019-01-10 ENCOUNTER — Other Ambulatory Visit: Payer: Self-pay

## 2019-01-10 DIAGNOSIS — E669 Obesity, unspecified: Secondary | ICD-10-CM

## 2019-01-10 DIAGNOSIS — E1169 Type 2 diabetes mellitus with other specified complication: Secondary | ICD-10-CM

## 2019-01-10 MED ORDER — JARDIANCE 25 MG PO TABS: 25.0000 mg | ORAL_TABLET | Freq: Every day | ORAL | 0 refills | Status: DC

## 2019-01-10 NOTE — Telephone Encounter (Signed)
empagliflozin (JARDIANCE) 25 MG TABS tablet 30 tablet 0 01/10/2019    Sig - Route: Take 25 mg by mouth daily. - Oral   Sent to pharmacy as: empagliflozin (JARDIANCE) 25 MG Tab tablet   E-Prescribing Status: Receipt confirmed by pharmacy (01/10/2019 7:43 AM EDT)

## 2019-01-11 ENCOUNTER — Other Ambulatory Visit: Payer: Self-pay | Admitting: Endocrinology

## 2019-01-11 MED ORDER — LEVOTHYROXINE SODIUM 150 MCG PO TABS: 150.0000 ug | ORAL_TABLET | Freq: Every day | ORAL | 3 refills | Status: DC

## 2019-01-13 ENCOUNTER — Other Ambulatory Visit: Payer: Self-pay | Admitting: Internal Medicine

## 2019-01-22 ENCOUNTER — Other Ambulatory Visit: Payer: Self-pay

## 2019-02-19 ENCOUNTER — Other Ambulatory Visit: Payer: Self-pay | Admitting: Endocrinology

## 2019-02-19 DIAGNOSIS — E1169 Type 2 diabetes mellitus with other specified complication: Secondary | ICD-10-CM

## 2019-02-24 ENCOUNTER — Other Ambulatory Visit: Payer: Self-pay | Admitting: Endocrinology

## 2019-02-24 DIAGNOSIS — E559 Vitamin D deficiency, unspecified: Secondary | ICD-10-CM

## 2019-02-25 ENCOUNTER — Ambulatory Visit (HOSPITAL_COMMUNITY)
Admission: EM | Admit: 2019-02-25 | Discharge: 2019-02-25 | Disposition: A | Discharge status: Home / Self Care | Payer: 59 | Attending: Urgent Care | Admitting: Urgent Care

## 2019-02-25 ENCOUNTER — Other Ambulatory Visit: Payer: Self-pay

## 2019-02-25 ENCOUNTER — Other Ambulatory Visit: Payer: Self-pay | Admitting: Endocrinology

## 2019-02-25 ENCOUNTER — Encounter (HOSPITAL_COMMUNITY): Payer: Self-pay | Admitting: Emergency Medicine

## 2019-02-25 ENCOUNTER — Ambulatory Visit (INDEPENDENT_AMBULATORY_CARE_PROVIDER_SITE_OTHER): Payer: 59

## 2019-02-25 DIAGNOSIS — R1032 Left lower quadrant pain: Secondary | ICD-10-CM

## 2019-02-25 DIAGNOSIS — E119 Type 2 diabetes mellitus without complications: Secondary | ICD-10-CM

## 2019-02-25 DIAGNOSIS — Z85038 Personal history of other malignant neoplasm of large intestine: Secondary | ICD-10-CM

## 2019-02-25 DIAGNOSIS — M7632 Iliotibial band syndrome, left leg: Secondary | ICD-10-CM

## 2019-02-25 DIAGNOSIS — M25552 Pain in left hip: Secondary | ICD-10-CM | POA: Diagnosis not present

## 2019-02-25 DIAGNOSIS — E559 Vitamin D deficiency, unspecified: Secondary | ICD-10-CM

## 2019-02-25 DIAGNOSIS — I4811 Longstanding persistent atrial fibrillation: Secondary | ICD-10-CM

## 2019-02-25 MED ORDER — PREDNISONE 10 MG PO TABS: 40.0000 mg | ORAL_TABLET | Freq: Every day | ORAL | 0 refills | Status: DC

## 2019-02-25 NOTE — ED Triage Notes (Signed)
Pt states he was awakened by a sudden sharp pain in his left hip on Thursday morning.  He states the pain is in the hip and radiates down his leg to below his knee.  He has also since developed pain in his left groin due to compensating for the pain in his hip when walking.

## 2019-02-25 NOTE — ED Provider Notes (Signed)
MRN: [id number] DOB: June 18, 1960  Subjective:   Colin Wood is a 58 y.o. male presenting for 3-day history of cute onset worsening intermittent sharp pains over his left hip that radiate laterally to his knee, now having intermittent sharp pains in his left groin as well.  Patient woke up with the symptoms, has not been helped by Tylenol.  Denies any kind of falls, trauma, weakness, numbness or tingling, low back pain, history of arthritis at the level of his back or his hip.  Denies any penile lesions, testicular pain, penile pain, dysuria, hematuria.  He is a type II diabetic, well controlled with his blood sugar.  He also has a history of gout, [name], atrial fibrillation, hyperlipidemia and is on Xarelto.   No current facility-administered medications for this encounter.   Current Outpatient Medications:  .  allopurinol (ZYLOPRIM) 300 MG tablet, Take 1 tablet (300 mg total) by mouth daily., Disp: 90 tablet, Rfl: 3 .  Ascorbic Acid (VITAMIN C PO), Take 2 tablets by mouth 2 (two) times daily., Disp: , Rfl:  .  atorvastatin (LIPITOR) 10 MG tablet, TAKE 1 TABLET(10 MG) BY MOUTH DAILY, Disp: 90 tablet, Rfl: 0 .  docusate sodium (COLACE) 100 MG capsule, Take 100 mg by mouth daily., Disp: , Rfl:  .  famotidine (PEPCID) 20 MG tablet, Take 1 tablet (20 mg total) by mouth at bedtime., Disp: 90 tablet, Rfl: 3 .  fenofibrate 160 MG tablet, Take 1 tablet (160 mg total) by mouth daily., Disp: 90 tablet, Rfl: 3 .  glucose blood (ONETOUCH VERIO) test strip, Use as instructed to check blood sugar 3 times daily., Disp: 100 each, Rfl: 3 .  Icosapent Ethyl (VASCEPA) 1 g CAPS, Take 2 capsules (2 g total) by mouth 2 (two) times daily. TAKE 2 CAPSULES BY MOUTH TWICE DAILY., Disp: 360 capsule, Rfl: 3 .  JARDIANCE 25 MG TABS tablet, TAKE 1 TABLET BY MOUTH DAILY, Disp: 30 tablet, Rfl: 0 .  Lancets (ONETOUCH DELICA PLUS Q000111Q) MISC, USE LANCETS TO CHECK BLOOD SUGAR THREE TIMES DAILY, Disp: 100 each, Rfl: 3 .   levothyroxine (SYNTHROID) 150 MCG tablet, Take 1 tablet (150 mcg total) by mouth daily., Disp: 90 tablet, Rfl: 3 .  lisinopril (ZESTRIL) 5 MG tablet, TAKE 1 TABLET BY MOUTH DAILY, Disp: 90 tablet, Rfl: 0 .  metFORMIN (GLUCOPHAGE) 1000 MG tablet, TAKE 1 TABLET BY MOUTH TWICE DAILY WITH MEALS, Disp: 180 tablet, Rfl: 1 .  metoprolol succinate (TOPROL-XL) 25 MG 24 hr tablet, TAKE 1 TABLET BY MOUTH DAILY, Disp: 90 tablet, Rfl: 2 .  Multiple Vitamin (MULTIVITAMIN) tablet, Take 1 tablet by mouth daily., Disp: , Rfl:  .  TRULICITY 1.5 0000000 SOPN, INJECT THE CONTENTS OF 1 PEN UNDER THE SKIN ONCE WEEKLY AS DIRECTED, Disp: 6 mL, Rfl: 0 .  XARELTO 20 MG TABS tablet, TAKE 1 TABLET(20 MG) BY MOUTH DAILY WITH SUPPER, Disp: 90 tablet, Rfl: 3 .  Vitamin D, Ergocalciferol, (DRISDOL) 1.25 MG (50000 UT) CAPS capsule, TAKE 1 CAPSULE BY MOUTH EVERY 7 DAYS, Disp: 12 capsule, Rfl: 0    No Known Allergies   Past Medical History:  Diagnosis Date  . Anxiety   . Atrial flutter ([employer])    typical appearing; s/p ablation 12-2012  . Constipation   . Depression    used to see psych, on no meds as of 03/2009  . Diabetes mellitus    TYPE 2  . Fatty liver   . GERD (gastroesophageal reflux disease)   . Gout   .  Hyperlipidemia   . Hypothyroidism   . Leg edema   . Obesity   . Persistent atrial fibrillation    PVI 12/2012  . Prediabetes   . Rhinitis    vasomotor  . S/P Minimally invasive maze operation for atrial fibrillation 03/12/2016   Complete bilateral atrial lesion set using cryothermy and bipolar radiofrequency ablation with clipping of LA appendage via right mini thoracotomy approach  . S/P patent foramen ovale closure 03/12/2016  . Sleep apnea    mild, now treated with CPAP since ~9-12     Past Surgical History:  Procedure Laterality Date  . ABLATION OF DYSRHYTHMIC FOCUS  01/05/2013   PVI and flutter ablation by [name]  . ATRIAL FIBRILLATION ABLATION N/A 01/05/2013   Procedure: ATRIAL FIBRILLATION  ABLATION;  Surgeon: Rabadi Grayer, MD;  Location: [facility] CATH LAB;  Service: Cardiovascular;  Laterality: N/A;  . CARDIAC CATHETERIZATION N/A 01/12/2016   Procedure: Right/Left Heart Cath and Coronary Angiography;  Surgeon: [name], MD;  Location: [place] CV LAB;  Service: Cardiovascular;  Laterality: N/A;  . CARDIOVERSION  03/01/11  . CLIPPING OF ATRIAL APPENDAGE Left 03/12/2016   Procedure: CLIPPING OF ATRIAL APPENDAGE;  Surgeon: [name], MD;  Location: [place];  Service: Open Heart Surgery;  Laterality: Left;  . MINIMALLY INVASIVE MAZE PROCEDURE N/A 03/12/2016   Procedure: MINIMALLY INVASIVE MAZE PROCEDURE;  Surgeon: [name], MD;  Location: [place];  Service: Open Heart Surgery;  Laterality: N/A;  . NASAL SINUS SURGERY    . TEE WITHOUT CARDIOVERSION N/A 01/04/2013   Procedure: TRANSESOPHAGEAL ECHOCARDIOGRAM (TEE);  Surgeon: [name], MD;  Location: [facility] ENDOSCOPY;  Service: Cardiovascular;  Laterality: N/A;  . TEE WITHOUT CARDIOVERSION N/A 03/12/2016   Procedure: TRANSESOPHAGEAL ECHOCARDIOGRAM (TEE);  Surgeon: [name], MD;  Location: [place];  Service: Open Heart Surgery;  Laterality: N/A;  . TONSILLECTOMY    . VASECTOMY      Family History  Problem Relation Age of Onset  . Asthma Mother   . Breast cancer Mother   . Dementia Mother   . Diabetes Father   . Hyperlipidemia Father   . Obesity Father   . Atrial fibrillation Father   . Hypertension Brother   . Melanoma Brother   . Allergies Daughter   . Throat cancer Maternal Uncle   . Colon cancer Paternal Grandfather   . Prostate cancer Neg Hx     Social History   Tobacco Use  . Smoking status: Never Smoker  . Smokeless tobacco: Never Used  Substance Use Topics  . Alcohol use: No  . Drug use: No    ROS  Objective:   Vitals: BP 131/74 (BP Location: Left Arm)   Pulse 85   Temp (!) 97.5 F (36.4 C) (Temporal)   Resp 18   SpO2 96%   Physical Exam Constitutional:      Appearance: Normal  appearance. He is well-developed and normal weight.  HENT:     Head: Normocephalic and atraumatic.     Right Ear: External ear normal.     Left Ear: External ear normal.     Nose: Nose normal.     Mouth/Throat:     Pharynx: Oropharynx is clear.  Eyes:     Extraocular Movements: Extraocular movements intact.     Pupils: Pupils are equal, round, and reactive to light.  Cardiovascular:     Rate and Rhythm: Normal rate.  Pulmonary:     Effort: Pulmonary effort is normal.  Musculoskeletal:     Left hip: He exhibits decreased range of motion (full flexion and extension but internal and external rotation are complete), tenderness (over areas depicted) and bony tenderness (lateral most portion of left hip). He exhibits normal strength, no swelling, no crepitus, no deformity and no laceration.     Lumbar back: He exhibits spasm. He exhibits normal range of motion, no tenderness, no bony tenderness, no swelling, no edema and no deformity.       Legs:  Neurological:     Mental Status: He is alert and oriented to person, place, and time.     Motor: No weakness.     Coordination: Coordination abnormal (uncomfortable sitting, leans over for relief of his pain, ambulating with crutch).  Psychiatric:        Mood and Affect: Mood normal.        Behavior: Behavior normal.     Dg Pelvis 1-2 Views  Result Date: 02/25/2019 CLINICAL DATA:  Left hip pain for 3 days.  Can not bear full weight. EXAM: PELVIS - 1-2 VIEW COMPARISON:  None. FINDINGS: Both hips are normally located. No hip fracture or plain film evidence of AVN. The pubic symphysis and SI joints are intact. No pelvic fractures or bone lesions. IMPRESSION: No acute bony findings. Electronically Signed   By: Marijo Sanes M.D.   On: 02/25/2019 12:43    Assessment and Plan :   1. Acute hip pain, left   2. Left groin pain   3. Well controlled type 2 diabetes mellitus ([place])   4. Longstanding persistent atrial fibrillation   5. History of colon  cancer    Will cover for IT band syndrome given normal hip x-ray and benign physical exam findings.  Counseled patient on working diagnosis of ITB S, will have him use a short steroid course given his medical history of diabetes, A. fib, [name] and current use of Xarelto. Counseled patient on potential for adverse effects with medications prescribed/recommended today, ER and return-to-clinic precautions discussed, patient verbalized understanding.    Jaynee Eagles, PA-C 02/25/19 [id number]

## 2019-02-26 ENCOUNTER — Other Ambulatory Visit: Payer: Self-pay

## 2019-02-26 DIAGNOSIS — E1169 Type 2 diabetes mellitus with other specified complication: Secondary | ICD-10-CM

## 2019-02-26 MED ORDER — JARDIANCE 25 MG PO TABS: 25.0000 mg | ORAL_TABLET | Freq: Every day | ORAL | 0 refills | Status: DC

## 2019-03-09 ENCOUNTER — Other Ambulatory Visit: Payer: Self-pay | Admitting: Endocrinology

## 2019-03-09 ENCOUNTER — Other Ambulatory Visit: Payer: Self-pay

## 2019-03-12 MED ORDER — TRULICITY 1.5 MG/0.5ML ~~LOC~~ SOAJ: 1.5000 mg | SUBCUTANEOUS | 2 refills | Status: DC

## 2019-03-12 NOTE — Telephone Encounter (Signed)
Review and refill if appropriate. 

## 2019-03-20 ENCOUNTER — Other Ambulatory Visit: Payer: Self-pay | Admitting: Endocrinology

## 2019-03-21 ENCOUNTER — Other Ambulatory Visit: Payer: Self-pay

## 2019-03-21 ENCOUNTER — Other Ambulatory Visit (INDEPENDENT_AMBULATORY_CARE_PROVIDER_SITE_OTHER): Payer: 59

## 2019-03-21 DIAGNOSIS — E063 Autoimmune thyroiditis: Secondary | ICD-10-CM

## 2019-03-21 DIAGNOSIS — E213 Hyperparathyroidism, unspecified: Secondary | ICD-10-CM

## 2019-03-21 DIAGNOSIS — E1169 Type 2 diabetes mellitus with other specified complication: Secondary | ICD-10-CM

## 2019-03-21 DIAGNOSIS — E669 Obesity, unspecified: Secondary | ICD-10-CM

## 2019-03-21 DIAGNOSIS — E559 Vitamin D deficiency, unspecified: Secondary | ICD-10-CM

## 2019-03-22 LAB — COMPREHENSIVE METABOLIC PANEL
ALT: 25 IU/L (ref 0–44)
AST: 18 IU/L (ref 0–40)
Albumin/Globulin Ratio: 2.5 — ABNORMAL HIGH (ref 1.2–2.2)
Albumin: 4.9 g/dL (ref 3.8–4.9)
Alkaline Phosphatase: 47 IU/L (ref 39–117)
BUN/Creatinine Ratio: 20 (ref 9–20)
BUN: 21 mg/dL (ref 6–24)
Bilirubin Total: 0.5 mg/dL (ref 0.0–1.2)
CO2: 25 mmol/L (ref 20–29)
Calcium: 10.3 mg/dL — ABNORMAL HIGH (ref 8.7–10.2)
Chloride: 103 mmol/L (ref 96–106)
Creatinine, Ser: 1.06 mg/dL (ref 0.76–1.27)
GFR calc Af Amer: 88 mL/min/{1.73_m2} (ref >59)
GFR calc non Af Amer: 76 mL/min/{1.73_m2} (ref >59)
Globulin, Total: 2 g/dL (ref 1.5–4.5)
Glucose: 133 mg/dL — ABNORMAL HIGH (ref 65–99)
Potassium: 5 mmol/L (ref 3.5–5.2)
Sodium: 143 mmol/L (ref 134–144)
Total Protein: 6.9 g/dL (ref 6.0–8.5)

## 2019-03-22 LAB — TSH: TSH: 0.503 u[IU]/mL (ref 0.450–4.500)

## 2019-03-22 LAB — VITAMIN D 25 HYDROXY (VIT D DEFICIENCY, FRACTURES): Vit D, 25-Hydroxy: 25.1 ng/mL — ABNORMAL LOW (ref 30.0–100.0)

## 2019-03-22 LAB — HEMOGLOBIN A1C
Est. average glucose Bld gHb Est-mCnc: 143 mg/dL
Hgb A1c MFr Bld: 6.6 % — ABNORMAL HIGH (ref 4.8–5.6)

## 2019-03-22 LAB — T4, FREE: Free T4: 2.09 ng/dL — ABNORMAL HIGH (ref 0.82–1.77)

## 2019-03-29 NOTE — Progress Notes (Signed)
Patient ID: Colin Wood, male   DOB: [birth year], 59 y.o.   MRN: [id number]           Reason for Appointment: Follow-up for Type 2 Diabetes  Referring physician: Larose Kells  History of Present Illness:          Diagnosis: Type 2 diabetes mellitus, date of diagnosis: ?  2011        Past history: He was not having any symptoms at the time of diagnosis and not clear what his initial blood sugars were He was started on metformin initially and had fairly good control Subsequently with higher sugars he was given Amaryl also in addition which has been continued.  A1c was 10.6% in 2012 Subsequently in 2014 his A1c was down to 6.5 but he had a regular follow-up subsequently Because of an A1c of 8.1 in 02/2014 he was given Tradjenta in addition to the above drugs but this was not effective  He was referred here because of a high A1c of 7.9 in 07/2014  Recent history:   Oral hypoglycemic drugs the patient is taking are: metformin 1 g twice a day, Trulicity 1.5 mg weekly,  mg daily      A1c has been somewhat variable and now 6.6 compared to 6%  Current management, blood sugar values and problems identified:  He again says that he has difficulty with lifestyle changes and has gained weight  Although his fasting blood sugars are only slightly higher than the last time his A1c is higher and likely has high postprandial readings which he does not monitor  He does only some yard work on weekends otherwise no exercise  His insurance changed him to [place] from [place]  He does take his Trulicity weekly on Sundays regularly and has no nausea with this  Meals are not planning well and is usually going to a fast food biscuit place in the morning  He does take his Metformin regularly         Side effects from medications have been:None  Compliance with the medical regimen: Fair   Glucose monitoring:  done less than once a day       Glucometer:   One Touch.      Blood Glucose readings by  review of monitor :   Recent blood sugar range 114-165, mostly morning readings and average 144  Self-care: The diet that the patient has been following is: None, he has difficulty controlling portions and carbohydrates at times     Will snack on apples and yogurt            Dietician visit, most recent: none, previously had gone to a class          Weight history: Previous range 260-280  Wt Readings from Last 3 Encounters:  03/30/19 239 lb 6.4 oz (108.6 kg)  11/24/18 233 lb 12.8 oz (106.1 kg)  10/16/18 234 lb 3.2 oz (106.2 kg)    Glycemic control:   Lab Results  Component Value Date   HGBA1C 6.6 (H) 03/21/2019   HGBA1C 6.0 (H) 11/17/2018   HGBA1C 6.5 (A) 07/27/2018   Lab Results  Component Value Date   LDLCALC 77 11/17/2018   CREATININE 1.06 03/21/2019      Allergies as of 03/30/2019   No Known Allergies     Medication List       Accurate as of March 30, 2019  8:25 AM. If you have any questions, ask your nurse or doctor.  STOP taking these medications   predniSONE 10 MG tablet Commonly known as: DELTASONE Stopped by: Elayne Snare, MD   Vitamin D (Ergocalciferol) 1.25 MG (50000 UT) Caps capsule Commonly known as: DRISDOL Stopped by: Elayne Snare, MD     TAKE these medications   allopurinol 300 MG tablet Commonly known as: ZYLOPRIM Take 1 tablet (300 mg total) by mouth daily.   atorvastatin 10 MG tablet Commonly known as: LIPITOR TAKE 1 TABLET(10 MG) BY MOUTH DAILY   docusate sodium 100 MG capsule Commonly known as: COLACE Take 100 mg by mouth daily.   famotidine 20 MG tablet Commonly known as: PEPCID Take 1 tablet (20 mg total) by mouth at bedtime.   fenofibrate 160 MG tablet Take 1 tablet (160 mg total) by mouth daily.   glucose blood test strip Commonly known as: OneTouch Verio Use as instructed to check blood sugar 3 times daily.   Icosapent Ethyl 1 g Caps Commonly known as: Vascepa Take 2 capsules (2 g total) by mouth 2 (two)  times daily. TAKE 2 CAPSULES BY MOUTH TWICE DAILY.   Jardiance 25 MG Tabs tablet Generic drug: empagliflozin Take 25 mg by mouth daily.   levothyroxine 175 MCG tablet Commonly known as: SYNTHROID Take 175 mcg by mouth daily before breakfast. What changed: Another medication with the same name was removed. Continue taking this medication, and follow the directions you see here. Changed by: Elayne Snare, MD   lisinopril 5 MG tablet Commonly known as: ZESTRIL TAKE 1 TABLET BY MOUTH DAILY   metFORMIN 1000 MG tablet Commonly known as: GLUCOPHAGE TAKE 1 TABLET BY MOUTH TWICE DAILY WITH MEALS   metoprolol succinate 25 MG 24 hr tablet Commonly known as: TOPROL-XL TAKE 1 TABLET BY MOUTH DAILY   multivitamin tablet Take 1 tablet by mouth daily.   OneTouch Delica Plus 123456 Misc USE LANCETS TO CHECK BLOOD SUGAR THREE TIMES DAILY   Trulicity 1.5 0000000 Sopn Generic drug: Dulaglutide Inject 1.5 mg into the skin once a week. What changed: Another medication with the same name was removed. Continue taking this medication, and follow the directions you see here. Changed by: Elayne Snare, MD   VITAMIN C PO Take 2 tablets by mouth 2 (two) times daily.   Xarelto 20 MG Tabs tablet Generic drug: rivaroxaban TAKE 1 TABLET(20 MG) BY MOUTH DAILY WITH SUPPER       Allergies:  No Known Allergies  Past Medical History:  Diagnosis Date  . Anxiety   . Atrial flutter ([place])    typical appearing; s/p ablation 12-2012  . Constipation   . Depression    used to see psych, on no meds as of 03/2009  . Diabetes mellitus    TYPE 2  . Fatty liver   . GERD (gastroesophageal reflux disease)   . Gout   . Hyperlipidemia   . Hypothyroidism   . Leg edema   . Obesity   . Persistent atrial fibrillation ([place])    PVI 12/2012  . Prediabetes   . Rhinitis    vasomotor  . S/P Minimally invasive maze operation for atrial fibrillation 03/12/2016   Complete bilateral atrial lesion set using cryothermy  and bipolar radiofrequency ablation with clipping of LA appendage via right mini thoracotomy approach  . S/P patent foramen ovale closure 03/12/2016  . Sleep apnea    mild, now treated with CPAP since ~9-12    Past Surgical History:  Procedure Laterality Date  . ABLATION OF DYSRHYTHMIC FOCUS  01/05/2013   PVI  and flutter ablation by [name]  . ATRIAL FIBRILLATION ABLATION N/A 01/05/2013   Procedure: ATRIAL FIBRILLATION ABLATION;  Surgeon: Scalese Grayer, MD;  Location: [facility] CATH LAB;  Service: Cardiovascular;  Laterality: N/A;  . CARDIAC CATHETERIZATION N/A 01/12/2016   Procedure: Right/Left Heart Cath and Coronary Angiography;  Surgeon: [name], MD;  Location: [place] CV LAB;  Service: Cardiovascular;  Laterality: N/A;  . CARDIOVERSION  03/01/11  . CLIPPING OF ATRIAL APPENDAGE Left 03/12/2016   Procedure: CLIPPING OF ATRIAL APPENDAGE;  Surgeon: [name], MD;  Location: [place];  Service: Open Heart Surgery;  Laterality: Left;  . MINIMALLY INVASIVE MAZE PROCEDURE N/A 03/12/2016   Procedure: MINIMALLY INVASIVE MAZE PROCEDURE;  Surgeon: [name], MD;  Location: [place];  Service: Open Heart Surgery;  Laterality: N/A;  . NASAL SINUS SURGERY    . TEE WITHOUT CARDIOVERSION N/A 01/04/2013   Procedure: TRANSESOPHAGEAL ECHOCARDIOGRAM (TEE);  Surgeon: [name], MD;  Location[facility] ENDOSCOPY;  Service: Cardiovascular;  Laterality: N/A;  . TEE WITHOUT CARDIOVERSION N/A 03/12/2016   Procedure: TRANSESOPHAGEAL ECHOCARDIOGRAM (TEE);  Surgeon: [name], MD;  Location: [place];  Service: Open Heart Surgery;  Laterality: N/A;  . TONSILLECTOMY    . VASECTOMY      Family History  Problem Relation Age of Onset  . Asthma Mother   . Breast cancer Mother   . Dementia Mother   . Diabetes Father   . Hyperlipidemia Father   . Obesity Father   . Atrial fibrillation Father   . Hypertension Brother   . Melanoma Brother   . Allergies Daughter   . Throat cancer Maternal Uncle   . Colon  cancer Paternal Grandfather   . Prostate cancer Neg Hx     Social History:  reports that he has never smoked. He has never used smokeless tobacco. He reports that he does not drink alcohol or use drugs.    Review of Systems         Lipids:  has high triglycerides mostly previously as high as 681; LDL previously also consistently high.   On Fenofibrate and taking Lipitor 10 mg daily  His LDL is well controlled and now triglycerides are back to normal  Overall diet is fairly good        Lab Results  Component Value Date   CHOL 151 11/17/2018   HDL 45 11/17/2018   LDLCALC 77 11/17/2018   TRIG 144 11/17/2018   CHOLHDL 3.4 11/17/2018                   Thyroid: He was diagnosed to have hypothyroidism in 1994  He is compliant with his generic levothyroxine every morning daily  Has been on levothyroxine 175 mcg, 6.5 tablets per week His TSH is   low even though it was back to normal in February Not clear why his free T4 is consistently high, his only vitamin is Leisure centre [employer]  Component Value Date   TSH 0.503 03/21/2019   TSH 0.258 (L) 11/17/2018   TSH 1.130 07/20/2018   FREET4 2.09 (H) 03/21/2019   FREET4 1.86 (H) 11/17/2018   FREET4 1.85 (H) 08/05/2017       The blood pressure has been treated by cardiologist with low-dose lisinopril and metoprolol   BP Readings from Last 3 Encounters:  03/30/19 124/74  02/25/19 131/74  11/24/18 (!) 148/70   Has been prescribed vitamin D weekly which was stopped because of high calcium Calcium is  still about the same but his vitamin D is now low  Lab Results  Component Value Date   VD25OH 25.1 (L) 03/21/2019   VD25OH 38.9 03/29/2018   VD25OH 40.7 08/05/2017   VD25OH 29.8 (L) 02/10/2017     HYPERCALCEMIA: This is a new finding on the last year his calcium continues to be upper normal No history of kidney stones or known osteopenia  Lab Results  Component Value Date   CALCIUM 10.3 (H) 03/21/2019   CALCIUM 10.3  (H) 11/17/2018   CALCIUM 10.4 (H) 07/20/2018   CALCIUM 10.1 03/29/2018   Lab Results  Component Value Date   PTH 15 11/24/2018   CALCIUM 10.3 (H) 03/21/2019   CAION 1.15 03/13/2016     Previously was having decreased libido and motivation and testosterone level was normal in 2019   Physical Examination:  BP 124/74 (BP Location: Left Arm, Patient Position: Sitting, Cuff Size: Normal)   Pulse 84   Ht 6\' 1"  (1.854 m)   Wt 239 lb 6.4 oz (108.6 kg)   SpO2 98%   BMI 31.59 kg/m     Diabetic Foot Exam - Simple   Simple Foot Form Diabetic Foot exam was performed with the following findings: Yes   Visual Inspection No deformities, no ulcerations, no other skin breakdown bilaterally: Yes Sensation Testing Intact to touch and monofilament testing bilaterally: Yes Pulse Check Posterior Tibialis and Dorsalis pulse intact bilaterally: Yes Comments      ASSESSMENT:  Diabetes type 2, with mild obesity  He is on a 3 drug regimen of metformin, Trulicity 1.5 mg and now Jardiance 25 mg  See history of present illness for discussion of his current management, blood sugar patterns and problems identified.    His A1c is 6.6 %  As before he has variable ability to keep his exercise regimen going and has not made efforts to improve diet, again blames this on his work schedule Breakfast is usually fast food Also not monitoring enough readings after meals Currently on 1.5 mg Trulicity and may benefit from the newly approved higher doses  Foot exam does not show signs of neuropathy or vascular disease  Hypercalcemia of unclear etiology, PTH only 15 and not better with stopping vitamin D Does not have any evidence of systemic disease  HYPOTHYROIDISM: TSH back to normal Free T4 is high likely to be from biotin interference, he is asymptomatic Discussed adjustment of dosage based on TSH level He currently is taking 175 mcg, 6 tablets a week    PLAN:   Increase TRULICITY up to 3  mg weekly using his current supply He will call if he is having any side effects from this otherwise next prescription will be for 3 mg instead of 1.5 Start checking sugars regularly after meals Given handout on healthier foods to eat at breakfast and avoid biscuit fast food sandwiches Start exercise on home equipment and continue through the winter Given information about blood sugar targets both fasting and after meals  Restart vitamin D but use only OTC vitamin D3, 2000 units daily  He will stay on the same dose of levothyroxine  Check lipids on the next visit  Recheck PTH on the next visit  Follow-up in 4 months  Patient Instructions  Check blood sugars on waking up 2-3 days a week  Also check blood sugars about 2 hours after meals and do this after different meals by rotation  Recommended blood sugar levels on waking up are 90-130 and about 2  hours after meal is 130-160  Please bring your blood sugar monitor to each visit, thank you  Try home exercise  Take otc Vitamin D3 2000, daily at dinner  Let us know when Trulicity is oiut  Total visit time for evaluation and management of multiple problems and counseling =25 minutes   Elayne Snare 03/30/2019, 8:25 AM   Note: This office note was prepared with Dragon voice recognition system technology. Any transcriptional errors that result from this process are unintentional.

## 2019-03-30 ENCOUNTER — Encounter: Payer: Self-pay | Admitting: Endocrinology

## 2019-03-30 ENCOUNTER — Ambulatory Visit: Payer: 59 | Admitting: Endocrinology

## 2019-03-30 ENCOUNTER — Other Ambulatory Visit: Payer: Self-pay

## 2019-03-30 VITALS — BP 124/74 | HR 84 | Ht 73.0 in | Wt 239.4 lb

## 2019-03-30 DIAGNOSIS — E782 Mixed hyperlipidemia: Secondary | ICD-10-CM | POA: Diagnosis not present

## 2019-03-30 DIAGNOSIS — E063 Autoimmune thyroiditis: Secondary | ICD-10-CM

## 2019-03-30 DIAGNOSIS — E559 Vitamin D deficiency, unspecified: Secondary | ICD-10-CM

## 2019-03-30 DIAGNOSIS — E1165 Type 2 diabetes mellitus with hyperglycemia: Secondary | ICD-10-CM | POA: Diagnosis not present

## 2019-03-30 NOTE — Patient Instructions (Addendum)
Check blood sugars on waking up 2-3 days a week  Also check blood sugars about 2 hours after meals and do this after different meals by rotation  Recommended blood sugar levels on waking up are 90-130 and about 2 hours after meal is 130-160  Please bring your blood sugar monitor to each visit, thank you  Try home exercise  Take otc Vitamin D3 2000, daily at dinner  Let us know when Trulicity is oiut check

## 2019-04-05 ENCOUNTER — Other Ambulatory Visit: Payer: Self-pay | Admitting: Endocrinology

## 2019-04-05 ENCOUNTER — Other Ambulatory Visit: Payer: Self-pay | Admitting: Internal Medicine

## 2019-04-16 ENCOUNTER — Other Ambulatory Visit: Payer: Self-pay | Admitting: Internal Medicine

## 2019-04-16 MED ORDER — LISINOPRIL 5 MG PO TABS: 5.0000 mg | ORAL_TABLET | Freq: Every day | ORAL | 1 refills | Status: DC

## 2019-04-18 ENCOUNTER — Encounter: Payer: Self-pay | Admitting: Internal Medicine

## 2019-04-18 ENCOUNTER — Other Ambulatory Visit: Payer: Self-pay

## 2019-04-18 ENCOUNTER — Ambulatory Visit (INDEPENDENT_AMBULATORY_CARE_PROVIDER_SITE_OTHER): Payer: 59 | Admitting: Internal Medicine

## 2019-04-18 VITALS — BP 114/74 | HR 86 | Temp 95.4°F | Resp 16 | Ht 73.0 in | Wt 236.1 lb

## 2019-04-18 DIAGNOSIS — Z0000 Encounter for general adult medical examination without abnormal findings: Secondary | ICD-10-CM | POA: Diagnosis not present

## 2019-04-18 MED ORDER — FENOFIBRATE 160 MG PO TABS: 160.0000 mg | ORAL_TABLET | Freq: Every day | ORAL | 3 refills | Status: DC

## 2019-04-18 MED ORDER — FAMOTIDINE 20 MG PO TABS: 20.0000 mg | ORAL_TABLET | Freq: Every day | ORAL | 3 refills | Status: DC

## 2019-04-18 MED ORDER — ALLOPURINOL 300 MG PO TABS: 300.0000 mg | ORAL_TABLET | Freq: Every day | ORAL | 3 refills | Status: DC

## 2019-04-18 MED ORDER — TRULICITY 3 MG/0.5ML ~~LOC~~ SOAJ: 3.0000 mg | SUBCUTANEOUS | 2 refills | Status: DC

## 2019-04-18 NOTE — Progress Notes (Signed)
Subjective:    Patient ID: Colin Wood, male    DOB: Oct 06, 1960, 58 y.o.   MRN: [id number]  DOS:  04/18/2019 Type of visit - description: cpx In general feels well He is very busy at work, that is making eating healthy difficult. Was seen with IT band syndrome, better with stretching.  Wt Readings from Last 3 Encounters:  04/18/19 236 lb 2 oz (107.1 kg)  03/30/19 239 lb 6.4 oz (108.6 kg)  11/24/18 233 lb 12.8 oz (106.1 kg)   Review of Systems  Other than above, a 14 point review of systems is negative    Past Medical History:  Diagnosis Date  . Anxiety   . Atrial flutter ([place])    typical appearing; s/p ablation 12-2012  . Constipation   . Depression    used to see psych, on no meds as of 03/2009  . Diabetes mellitus    TYPE 2  . Fatty liver   . GERD (gastroesophageal reflux disease)   . Gout   . Hyperlipidemia   . Hypothyroidism   . Leg edema   . Obesity   . Persistent atrial fibrillation ([place])    PVI 12/2012  . Prediabetes   . Rhinitis    vasomotor  . S/P Minimally invasive maze operation for atrial fibrillation 03/12/2016   Complete bilateral atrial lesion set using cryothermy and bipolar radiofrequency ablation with clipping of LA appendage via right mini thoracotomy approach  . S/P patent foramen ovale closure 03/12/2016  . Sleep apnea    mild, now treated with CPAP since ~9-12    Past Surgical History:  Procedure Laterality Date  . ABLATION OF DYSRHYTHMIC FOCUS  01/05/2013   PVI and flutter ablation by [name]  . ATRIAL FIBRILLATION ABLATION N/A 01/05/2013   Procedure: ATRIAL FIBRILLATION ABLATION;  Surgeon: Starr Grayer, MD;  Location: [facility] CATH LAB;  Service: Cardiovascular;  Laterality: N/A;  . CARDIAC CATHETERIZATION N/A 01/12/2016   Procedure: Right/Left Heart Cath and Coronary Angiography;  Surgeon: [name], MD;  Location: [place] CV LAB;  Service: Cardiovascular;  Laterality: N/A;  . CARDIOVERSION  03/01/11  . CLIPPING OF ATRIAL  APPENDAGE Left 03/12/2016   Procedure: CLIPPING OF ATRIAL APPENDAGE;  Surgeon: [name], MD;  Location: [place];  Service: Open Heart Surgery;  Laterality: Left;  . MINIMALLY INVASIVE MAZE PROCEDURE N/A 03/12/2016   Procedure: MINIMALLY INVASIVE MAZE PROCEDURE;  Surgeon: [name], MD;  Location: [place];  Service: Open Heart Surgery;  Laterality: N/A;  . NASAL SINUS SURGERY    . TEE WITHOUT CARDIOVERSION N/A 01/04/2013   Procedure: TRANSESOPHAGEAL ECHOCARDIOGRAM (TEE);  Surgeon: [name], MD;  Location[facility] ENDOSCOPY;  Service: Cardiovascular;  Laterality: N/A;  . TEE WITHOUT CARDIOVERSION N/A 03/12/2016   Procedure: TRANSESOPHAGEAL ECHOCARDIOGRAM (TEE);  Surgeon: [name], MD;  Location: [place];  Service: Open Heart Surgery;  Laterality: N/A;  . TONSILLECTOMY    . VASECTOMY     Family History  Problem Relation Age of Onset  . Asthma Mother   . Breast cancer Mother   . Dementia Mother   . Diabetes Father   . Hyperlipidemia Father   . Obesity Father   . Atrial fibrillation Father   . Hypertension Brother   . Melanoma Brother   . Allergies Daughter   . Throat cancer Maternal Uncle   . Colon cancer Paternal Grandfather   . Prostate cancer Neg Hx     Social History   Socioeconomic History  .  Marital status: Married    Spouse name: Mardene Celeste  . Number of children: 2  . Years of education: Not on file  . Highest education level: Not on file  Occupational History  . 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 Financial resource strain: Not on file  . Food insecurity    Worry: Not on file    Inability: Not on file  . Transportation needs    Medical: Not on file    Non-medical: Not on file  Tobacco Use  . Smoking status: Never Smoker  . Smokeless tobacco: Never Used  Substance and Sexual Activity  . Alcohol use: No  . Drug use: No  . Sexual activity: Not on file  Lifestyle  . Physical activity    Days per week: Not on file    Minutes per session: Not on  file  . Stress: Not on file  Relationships  . Social Herbalist on phone: Not on file    Gets together: Not on file    Attends religious service: Not on file    Active member of club or organization: Not on file    Attends meetings of clubs or organizations: Not on file    Relationship status: Not on file  . Intimate partner violence    Fear of current or ex partner: Not on file    Emotionally abused: Not on file    Physically abused: Not on file    Forced sexual activity: Not on file  Other Topics Concern  . Not on file  Social History Narrative   Pt lives in [place] with spouse and 1 child   2 children  1988, 2000 .     Works in Engineer, technical sales at [employer].      Allergies as of 04/18/2019   No Known Allergies     Medication List       Accurate as of April 18, 2019 11:59 PM. If you have any questions, ask your nurse or doctor.        allopurinol 300 MG tablet Commonly known as: ZYLOPRIM Take 1 tablet (300 mg total) by mouth daily.   atorvastatin 10 MG tablet Commonly known as: LIPITOR TAKE 1 TABLET(10 MG) BY MOUTH DAILY   docusate sodium 100 MG capsule Commonly known as: COLACE Take 100 mg by mouth daily.   famotidine 20 MG tablet Commonly known as: PEPCID Take 1 tablet (20 mg total) by mouth at bedtime.   fenofibrate 160 MG tablet Take 1 tablet (160 mg total) by mouth daily.   glucose blood test strip Commonly known as: OneTouch Verio Use as instructed to check blood sugar 3 times daily.   icosapent Ethyl 1 g capsule Commonly known as: Vascepa Take 2 capsules (2 g total) by mouth 2 (two) times daily. TAKE 2 CAPSULES BY MOUTH TWICE DAILY.   Jardiance 25 MG Tabs tablet Generic drug: empagliflozin Take 25 mg by mouth daily.   levothyroxine 175 MCG tablet Commonly known as: SYNTHROID TAKE 1 TABLET(175 MCG) BY MOUTH DAILY   lisinopril 5 MG tablet Commonly known as: ZESTRIL Take 1 tablet (5 mg total) by mouth daily.   metFORMIN 1000 MG tablet  Commonly known as: GLUCOPHAGE TAKE 1 TABLET BY MOUTH TWICE DAILY WITH MEALS   metoprolol succinate 25 MG 24 hr tablet Commonly known as: TOPROL-XL TAKE 1 TABLET BY MOUTH DAILY   multivitamin tablet Take 1 tablet by mouth daily.   OneTouch Delica Plus 123456 Misc USE  LANCETS TO CHECK BLOOD SUGAR THREE TIMES DAILY   Trulicity 3 0000000 Sopn Generic drug: Dulaglutide Inject 3 mg into the skin once a week. Inject 3mg  under the skin once weekly. What changed:   how much to take  Another medication with the same name was removed. Continue taking this medication, and follow the directions you see here. Changed by: Jayme Cloud, LPN   VITAMIN C PO Take 2 tablets by mouth 2 (two) times daily.   Vitamin D3 50 MCG (2000 UT) capsule   Xarelto 20 MG Tabs tablet Generic drug: rivaroxaban TAKE 1 TABLET(20 MG) BY MOUTH DAILY WITH SUPPER           Objective:   Physical Exam BP 114/74 (BP Location: Left Arm, Patient Position: Sitting, Cuff Size: Normal)   Pulse 86   Temp (!) 95.4 F (35.2 C) (Temporal)   Resp 16   Ht 6\' 1"  (1.854 m)   Wt 236 lb 2 oz (107.1 kg)   SpO2 97%   BMI 31.15 kg/m   General: Well developed, NAD, BMI noted Neck: No  thyromegaly  HEENT:  Normocephalic . Face symmetric, atraumatic Lungs:  CTA B Normal respiratory effort, no intercostal retractions, no accessory muscle use. Heart: Regular,  no murmur.  No pretibial edema bilaterally  Abdomen:  Not distended, soft, non-tender. No rebound or rigidity.   Skin: Exposed areas without rash. Not pale. DRE:  normal sphincter tone, brown stools, prostate not enlarged, not tender. Neurologic:  alert & oriented X3.  Speech normal, gait appropriate for age and unassisted Strength symmetric and appropriate for age.  Psych: Cognition and judgment appear intact.  Cooperative with normal attention span and concentration.  Behavior appropriate. No anxious or depressed appearing.     Assessment      Assessment  DM Dr. Dwyane Dee since 07-2014 Hyperlipidemia Hypothyroidism A flutter, ablation 12-2012, then  Paroxysmal Afib, s/p Maze-PFO closure-Atriclip 02-2016 GERD Depression Obesity Gout [name], CPAP since 2012; d/c after wt loss 2019  PLAN Here for CPX A Fib: Seen by cardiology 09/2018, persistent A. fib, on Xarelto. DM, hypothyroidism: Per endocrinology [name]: After he weight loss in 2019 he stopped CPAP.  His weight was as low as 220, today is 236.  At the present time his snoring is mild, no fatigue or feeling sleepy.  Encouraged to redouble his efforts to lose weight. RTC 1 year

## 2019-04-18 NOTE — Progress Notes (Signed)
Pre visit review using our clinic review tool, if applicable. No additional management support is needed unless otherwise documented below in the visit note. 

## 2019-04-18 NOTE — Assessment & Plan Note (Addendum)
-   Td  2018,pnm 23:  2015; prevnar 2016 -had a flu shot at work;  -shingrex discussed  -CCS: Cscope 08-2005: int.  hemorrhoids, Cscope 05/2018, next 3 years per GI letter  -Prostate cancer screening: DRE normal, check a PSA. -Labs reviewed, needs CBC PSA -Diet and exercise discussed

## 2019-04-18 NOTE — Patient Instructions (Addendum)
GO TO THE LAB : Get the blood work    CBC PSA at [facility] Schedule your next appointment   For a physical exam in 1 year

## 2019-04-19 LAB — CBC WITH DIFFERENTIAL/PLATELET
Basophils Absolute: 0.1 10*3/uL (ref 0.0–0.2)
Basos: 1 %
EOS (ABSOLUTE): 0.1 10*3/uL (ref 0.0–0.4)
Eos: 2 %
Hematocrit: 46.8 % (ref 37.5–51.0)
Hemoglobin: 15.9 g/dL (ref 13.0–17.7)
Immature Grans (Abs): 0 10*3/uL (ref 0.0–0.1)
Immature Granulocytes: 0 %
Lymphocytes Absolute: 1.8 10*3/uL (ref 0.7–3.1)
Lymphs: 32 %
MCH: 29.1 pg (ref 26.6–33.0)
MCHC: 34 g/dL (ref 31.5–35.7)
MCV: 86 fL (ref 79–97)
Monocytes Absolute: 0.7 10*3/uL (ref 0.1–0.9)
Monocytes: 12 %
Neutrophils Absolute: 2.9 10*3/uL (ref 1.4–7.0)
Neutrophils: 53 %
Platelets: 233 10*3/uL (ref 150–450)
RBC: 5.46 x10E6/uL (ref 4.14–5.80)
RDW: 13.1 % (ref 11.6–15.4)
WBC: 5.6 10*3/uL (ref 3.4–10.8)

## 2019-04-19 LAB — PSA: Prostate Specific Ag, Serum: 1.4 ng/mL (ref 0.0–4.0)

## 2019-04-19 NOTE — Assessment & Plan Note (Signed)
Here for CPX A Fib: Seen by cardiology 09/2018, persistent A. fib, on Xarelto. DM, hypothyroidism: Per endocrinology [name]: After he weight loss in 2019 he stopped CPAP.  His weight was as low as 220, today is 236.  At the present time his snoring is mild, no fatigue or feeling sleepy.  Encouraged to redouble his efforts to lose weight. RTC 1 year

## 2019-04-20 ENCOUNTER — Other Ambulatory Visit: Payer: Self-pay

## 2019-04-20 ENCOUNTER — Ambulatory Visit (HOSPITAL_COMMUNITY)
Admission: RE | Admit: 2019-04-20 | Discharge: 2019-04-20 | Disposition: A | Discharge status: Home / Self Care | Payer: 59 | Source: Ambulatory Visit | Attending: Nurse Practitioner | Admitting: Nurse Practitioner

## 2019-04-20 ENCOUNTER — Encounter (HOSPITAL_COMMUNITY): Payer: Self-pay | Admitting: Nurse Practitioner

## 2019-04-20 VITALS — BP 108/70 | HR 81 | Ht 73.0 in | Wt 237.4 lb

## 2019-04-20 DIAGNOSIS — Z825 Family history of asthma and other chronic lower respiratory diseases: Secondary | ICD-10-CM | POA: Diagnosis not present

## 2019-04-20 DIAGNOSIS — Z79899 Other long term (current) drug therapy: Secondary | ICD-10-CM | POA: Diagnosis not present

## 2019-04-20 DIAGNOSIS — E039 Hypothyroidism, unspecified: Secondary | ICD-10-CM | POA: Diagnosis not present

## 2019-04-20 DIAGNOSIS — Z7984 Long term (current) use of oral hypoglycemic drugs: Secondary | ICD-10-CM | POA: Insufficient documentation

## 2019-04-20 DIAGNOSIS — Z8679 Personal history of other diseases of the circulatory system: Secondary | ICD-10-CM

## 2019-04-20 DIAGNOSIS — K219 Gastro-esophageal reflux disease without esophagitis: Secondary | ICD-10-CM | POA: Diagnosis not present

## 2019-04-20 DIAGNOSIS — Z803 Family history of malignant neoplasm of breast: Secondary | ICD-10-CM | POA: Insufficient documentation

## 2019-04-20 DIAGNOSIS — E785 Hyperlipidemia, unspecified: Secondary | ICD-10-CM | POA: Diagnosis not present

## 2019-04-20 DIAGNOSIS — Z79890 Hormone replacement therapy (postmenopausal): Secondary | ICD-10-CM | POA: Insufficient documentation

## 2019-04-20 DIAGNOSIS — D6869 Other thrombophilia: Secondary | ICD-10-CM

## 2019-04-20 DIAGNOSIS — M109 Gout, unspecified: Secondary | ICD-10-CM | POA: Diagnosis not present

## 2019-04-20 DIAGNOSIS — E119 Type 2 diabetes mellitus without complications: Secondary | ICD-10-CM | POA: Insufficient documentation

## 2019-04-20 DIAGNOSIS — Z808 Family history of malignant neoplasm of other organs or systems: Secondary | ICD-10-CM | POA: Diagnosis not present

## 2019-04-20 DIAGNOSIS — Z7901 Long term (current) use of anticoagulants: Secondary | ICD-10-CM | POA: Diagnosis not present

## 2019-04-20 DIAGNOSIS — Z833 Family history of diabetes mellitus: Secondary | ICD-10-CM | POA: Diagnosis not present

## 2019-04-20 DIAGNOSIS — Z8249 Family history of ischemic heart disease and other diseases of the circulatory system: Secondary | ICD-10-CM | POA: Diagnosis not present

## 2019-04-20 DIAGNOSIS — Z9889 Other specified postprocedural states: Secondary | ICD-10-CM

## 2019-04-20 DIAGNOSIS — I480 Paroxysmal atrial fibrillation: Secondary | ICD-10-CM | POA: Diagnosis present

## 2019-04-20 NOTE — Addendum Note (Signed)
Encounter addended by: Sherran Needs, NP on: 04/20/2019 12:25 PM  Actions taken: Clinical Note Signed

## 2019-04-20 NOTE — Progress Notes (Addendum)
Patient ID: Colin Wood, male   DOB: 11/12/1960, 58 y.o.   MRN: [id number]     Primary Care Physician: Colon Branch, MD Referring Physician: Dr. Faythe Ghee is a 58 y.o. male with a h/o PAF. failing tikosyn in the past and as well as s/p ablation in 2014,who  developed  persistent afib. He had a Maze procedure, with clipping of left atrial appendage,  October 2017.  F/u in afib clinic, 04/20/18, he continues to do well s/p maze procedure. No afib at all noted. Off AAD for some time now, continues on xarelto without bleeding issues.   F/u in afib clinic 04/20/19. Continues to do well without any afib. No shortness of breath. Continues on xarelero 20 mg daily with CHA2DS2VASc score of 2.  Today, he denies symptoms of palpitations, chest pain, shortness of breath, orthopnea, PND, lower extremity edema, dizziness, presyncope, syncope, or neurologic sequela. The patient is tolerating medications without difficulties and is otherwise without complaint today.   Past Medical History:  Diagnosis Date  . Anxiety   . Atrial flutter ([place])    typical appearing; s/p ablation 12-2012  . Constipation   . Depression    used to see psych, on no meds as of 03/2009  . Diabetes mellitus    TYPE 2  . Fatty liver   . GERD (gastroesophageal reflux disease)   . Gout   . Hyperlipidemia   . Hypothyroidism   . Leg edema   . Obesity   . Persistent atrial fibrillation ([place])    PVI 12/2012  . Prediabetes   . Rhinitis    vasomotor  . S/P Minimally invasive maze operation for atrial fibrillation 03/12/2016   Complete bilateral atrial lesion set using cryothermy and bipolar radiofrequency ablation with clipping of LA appendage via right mini thoracotomy approach  . S/P patent foramen ovale closure 03/12/2016  . Sleep apnea    mild, now treated with CPAP since ~9-12   Past Surgical History:  Procedure Laterality Date  . ABLATION OF DYSRHYTHMIC FOCUS  01/05/2013   PVI and flutter  ablation by [name]  . ATRIAL FIBRILLATION ABLATION N/A 01/05/2013   Procedure: ATRIAL FIBRILLATION ABLATION;  Surgeon: Fowers Grayer, MD;  Location: [facility] CATH LAB;  Service: Cardiovascular;  Laterality: N/A;  . CARDIAC CATHETERIZATION N/A 01/12/2016   Procedure: Right/Left Heart Cath and Coronary Angiography;  Surgeon: [name], MD;  Location: [place] CV LAB;  Service: Cardiovascular;  Laterality: N/A;  . CARDIOVERSION  03/01/11  . CLIPPING OF ATRIAL APPENDAGE Left 03/12/2016   Procedure: CLIPPING OF ATRIAL APPENDAGE;  Surgeon: [name], MD;  Location: [place];  Service: Open Heart Surgery;  Laterality: Left;  . MINIMALLY INVASIVE MAZE PROCEDURE N/A 03/12/2016   Procedure: MINIMALLY INVASIVE MAZE PROCEDURE;  Surgeon: [name], MD;  Location: [place];  Service: Open Heart Surgery;  Laterality: N/A;  . NASAL SINUS SURGERY    . TEE WITHOUT CARDIOVERSION N/A 01/04/2013   Procedure: TRANSESOPHAGEAL ECHOCARDIOGRAM (TEE);  Surgeon: [name], MD;  Location: [facility] ENDOSCOPY;  Service: Cardiovascular;  Laterality: N/A;  . TEE WITHOUT CARDIOVERSION N/A 03/12/2016   Procedure: TRANSESOPHAGEAL ECHOCARDIOGRAM (TEE);  Surgeon: [name], MD;  Location: [place];  Service: Open Heart Surgery;  Laterality: N/A;  . TONSILLECTOMY    . VASECTOMY      Current Outpatient Medications  Medication Sig Dispense Refill  . allopurinol (ZYLOPRIM) 300 MG tablet Take 1 tablet (300 mg total) by mouth  daily. 90 tablet 3  . Ascorbic Acid (VITAMIN C PO) Take 2 tablets by mouth 2 (two) times daily.    [name] atorvastatin (LIPITOR) 10 MG tablet TAKE 1 TABLET(10 MG) BY MOUTH DAILY 90 tablet 0  . Cholecalciferol (VITAMIN D3) 50 MCG (2000 UT) capsule     . docusate sodium (COLACE) 100 MG capsule Take 100 mg by mouth daily.    . Dulaglutide (TRULICITY) 3 0000000 SOPN Inject 3 mg into the skin once a week. Inject 3mg  under the skin once weekly. 2 mL 2  . empagliflozin (JARDIANCE) 25 MG TABS tablet Take 25 mg by  mouth daily. 90 tablet 0  . famotidine (PEPCID) 20 MG tablet Take 1 tablet (20 mg total) by mouth at bedtime. 90 tablet 3  . fenofibrate 160 MG tablet Take 1 tablet (160 mg total) by mouth daily. 90 tablet 3  . glucose blood (ONETOUCH VERIO) test strip Use as instructed to check blood sugar 3 times daily. 100 each 3  . Icosapent Ethyl (VASCEPA) 1 g CAPS Take 2 capsules (2 g total) by mouth 2 (two) times daily. TAKE 2 CAPSULES BY MOUTH TWICE DAILY. 360 capsule 3  . Lancets (ONETOUCH DELICA PLUS Q000111Q) MISC USE LANCETS TO CHECK BLOOD SUGAR THREE TIMES DAILY 100 each 3  . levothyroxine (SYNTHROID) 175 MCG tablet TAKE 1 TABLET(175 MCG) BY MOUTH DAILY 90 tablet 1  . lisinopril (ZESTRIL) 5 MG tablet Take 1 tablet (5 mg total) by mouth daily. 90 tablet 1  . metFORMIN (GLUCOPHAGE) 1000 MG tablet TAKE 1 TABLET BY MOUTH TWICE DAILY WITH MEALS 180 tablet 1  . metoprolol succinate (TOPROL-XL) 25 MG 24 hr tablet TAKE 1 TABLET BY MOUTH DAILY 90 tablet 2  . Multiple Vitamin (MULTIVITAMIN) tablet Take 1 tablet by mouth daily.    Alveda Reasons 20 MG TABS tablet TAKE 1 TABLET(20 MG) BY MOUTH DAILY WITH SUPPER 90 tablet 3   No current facility-administered medications for this encounter.     No Known Allergies  Social History   Socioeconomic History  . Marital status: Married    Spouse name: Mardene Celeste  . Number of children: 2  . Years of education: Not on file  . Highest education level: Not on file  Occupational History  . 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 Financial resource strain: Not on file  . Food insecurity    Worry: Not on file    Inability: Not on file  . Transportation needs    Medical: Not on file    Non-medical: Not on file  Tobacco Use  . Smoking status: Never Smoker  . Smokeless tobacco: Never Used  Substance and Sexual Activity  . Alcohol use: No  . Drug use: No  . Sexual activity: Not on file  Lifestyle  . Physical activity    Days per week: Not on file    Minutes  per session: Not on file  . Stress: Not on file  Relationships  . Social Herbalist on phone: Not on file    Gets together: Not on file    Attends religious service: Not on file    Active member of club or organization: Not on file    Attends meetings of clubs or organizations: Not on file    Relationship status: Not on file  . Intimate partner violence    Fear of current or ex partner: Not on file    Emotionally abused: Not on file  Physically abused: Not on file    Forced sexual activity: Not on file  Other Topics Concern  . Not on file  Social History Narrative   Pt lives in [place] with spouse and 1 child   2 children  1988, 2000 .     Works in Engineer, technical sales at [employer].    Family History  Problem Relation Age of Onset  . Asthma Mother   . Breast cancer Mother   . Dementia Mother   . Diabetes Father   . Hyperlipidemia Father   . Obesity Father   . Atrial fibrillation Father   . Hypertension Brother   . Melanoma Brother   . Allergies Daughter   . Throat cancer Maternal Uncle   . Colon cancer Paternal Grandfather   . Prostate cancer Neg Hx     ROS- All systems are reviewed and negative except as per the HPI above  Physical Exam: Vitals:   04/20/19 0830  BP: 108/70  Pulse: 81  Weight: 107.7 kg  Height: 6\' 1"  (1.854 m)    GEN- The patient is well appearing, alert and oriented x 3 today.   Head- normocephalic, atraumatic Eyes-  Sclera clear, conjunctiva pink Ears- hearing intact Oropharynx- clear Neck- supple, no JVP Lymph- no cervical lymphadenopathy Lungs- Clear to ausculation bilaterally, normal work of breathing Heart- regular rate and rhythm, no murmurs, rubs or gallops, PMI not laterally displaced GI- soft, NT, ND, + BS Extremities- no clubbing, cyanosis, or edema MS- no significant deformity or atrophy Skin- no rash or lesion Psych- euthymic mood, full affect Neuro- strength and sensation are intact  EKG-NSR at 81 bpm,RBBB, Pr int 168 ms,  qrs int 122 ms, qt int 446 ms  Assessment and Plan: 1. AFib s/p maze procedure Maintaining SR since procedure off AAD therapy Continue xarelto with chadsvasc score of 2 (htn, DM)  2. Lifestyle factors Encouraged exercise, weight loss Use cpap  F/u with [name] in 6 months afib clinic in one year for surveillance of [address]. Demetruis Depaul, [facility] [address] [place], [place] [address] [phone]

## 2019-05-23 ENCOUNTER — Other Ambulatory Visit: Payer: Self-pay | Admitting: Endocrinology

## 2019-05-27 ENCOUNTER — Other Ambulatory Visit: Payer: Self-pay | Admitting: Endocrinology

## 2019-05-27 DIAGNOSIS — E1169 Type 2 diabetes mellitus with other specified complication: Secondary | ICD-10-CM

## 2019-05-31 ENCOUNTER — Other Ambulatory Visit: Payer: Self-pay

## 2019-05-31 MED ORDER — TRULICITY 3 MG/0.5ML ~~LOC~~ SOAJ: 3.0000 mg | SUBCUTANEOUS | 0 refills | Status: DC

## 2019-06-20 ENCOUNTER — Other Ambulatory Visit: Payer: Self-pay | Admitting: Endocrinology

## 2019-06-23 ENCOUNTER — Other Ambulatory Visit: Payer: Self-pay | Admitting: Endocrinology

## 2019-07-11 ENCOUNTER — Other Ambulatory Visit: Payer: Self-pay | Admitting: Internal Medicine

## 2019-07-11 ENCOUNTER — Other Ambulatory Visit: Payer: Self-pay | Admitting: Endocrinology

## 2019-07-11 ENCOUNTER — Other Ambulatory Visit: Payer: Self-pay

## 2019-07-11 ENCOUNTER — Other Ambulatory Visit: Payer: 59

## 2019-07-11 DIAGNOSIS — E669 Obesity, unspecified: Secondary | ICD-10-CM

## 2019-07-11 DIAGNOSIS — E559 Vitamin D deficiency, unspecified: Secondary | ICD-10-CM

## 2019-07-11 DIAGNOSIS — E063 Autoimmune thyroiditis: Secondary | ICD-10-CM

## 2019-07-11 DIAGNOSIS — E1165 Type 2 diabetes mellitus with hyperglycemia: Secondary | ICD-10-CM

## 2019-07-11 DIAGNOSIS — E782 Mixed hyperlipidemia: Secondary | ICD-10-CM

## 2019-07-11 DIAGNOSIS — E1169 Type 2 diabetes mellitus with other specified complication: Secondary | ICD-10-CM

## 2019-07-12 LAB — COMPREHENSIVE METABOLIC PANEL
ALT: 21 IU/L (ref 0–44)
AST: 20 IU/L (ref 0–40)
Albumin/Globulin Ratio: 2.4 — ABNORMAL HIGH (ref 1.2–2.2)
Albumin: 5.2 g/dL — ABNORMAL HIGH (ref 3.8–4.9)
Alkaline Phosphatase: 47 IU/L (ref 39–117)
BUN/Creatinine Ratio: 16 (ref 9–20)
BUN: 17 mg/dL (ref 6–24)
Bilirubin Total: 0.6 mg/dL (ref 0.0–1.2)
CO2: 21 mmol/L (ref 20–29)
Calcium: 10.5 mg/dL — ABNORMAL HIGH (ref 8.7–10.2)
Chloride: 101 mmol/L (ref 96–106)
Creatinine, Ser: 1.06 mg/dL (ref 0.76–1.27)
GFR calc Af Amer: 88 mL/min/{1.73_m2} (ref >59)
GFR calc non Af Amer: 76 mL/min/{1.73_m2} (ref >59)
Globulin, Total: 2.2 g/dL (ref 1.5–4.5)
Glucose: 119 mg/dL — ABNORMAL HIGH (ref 65–99)
Potassium: 4.6 mmol/L (ref 3.5–5.2)
Sodium: 141 mmol/L (ref 134–144)
Total Protein: 7.4 g/dL (ref 6.0–8.5)

## 2019-07-12 LAB — URINALYSIS, ROUTINE W REFLEX MICROSCOPIC
Bilirubin, UA: NEGATIVE
Ketones, UA: NEGATIVE
Leukocytes,UA: NEGATIVE
Nitrite, UA: NEGATIVE
Protein,UA: NEGATIVE
RBC, UA: NEGATIVE
Specific Gravity, UA: >=1.03 — AB (ref 1.005–1.030)
Urobilinogen, Ur: 0.2 mg/dL (ref 0.2–1.0)
pH, UA: 5 (ref 5.0–7.5)

## 2019-07-12 LAB — MICROALBUMIN / CREATININE URINE RATIO
Creatinine, Urine: 85 mg/dL
Microalb/Creat Ratio: 4 mg/g creat (ref 0–29)
Microalbumin, Urine: 3.1 ug/mL

## 2019-07-12 LAB — PARATHYROID HORMONE, INTACT (NO CA): PTH: 26 pg/mL (ref 15–65)

## 2019-07-12 LAB — LIPID PANEL
Chol/HDL Ratio: 3.4 ratio (ref 0.0–5.0)
Cholesterol, Total: 169 mg/dL (ref 100–199)
HDL: 50 mg/dL (ref >39)
LDL Chol Calc (NIH): 93 mg/dL (ref 0–99)
Triglycerides: 146 mg/dL (ref 0–149)
VLDL Cholesterol Cal: 26 mg/dL (ref 5–40)

## 2019-07-12 LAB — HEMOGLOBIN A1C
Est. average glucose Bld gHb Est-mCnc: 128 mg/dL
Hgb A1c MFr Bld: 6.1 % — ABNORMAL HIGH (ref 4.8–5.6)

## 2019-07-12 LAB — VITAMIN D 25 HYDROXY (VIT D DEFICIENCY, FRACTURES): Vit D, 25-Hydroxy: 33.6 ng/mL (ref 30.0–100.0)

## 2019-07-12 LAB — TSH: TSH: 0.369 u[IU]/mL — ABNORMAL LOW (ref 0.450–4.500)

## 2019-07-12 LAB — T4, FREE: Free T4: 1.93 ng/dL — ABNORMAL HIGH (ref 0.82–1.77)

## 2019-07-18 ENCOUNTER — Ambulatory Visit: Payer: 59 | Admitting: Endocrinology

## 2019-07-18 ENCOUNTER — Other Ambulatory Visit: Payer: Self-pay

## 2019-07-18 ENCOUNTER — Encounter: Payer: Self-pay | Admitting: Endocrinology

## 2019-07-18 VITALS — BP 110/68 | HR 84 | Ht 73.0 in | Wt 229.8 lb

## 2019-07-18 DIAGNOSIS — E063 Autoimmune thyroiditis: Secondary | ICD-10-CM | POA: Diagnosis not present

## 2019-07-18 DIAGNOSIS — E1169 Type 2 diabetes mellitus with other specified complication: Secondary | ICD-10-CM | POA: Diagnosis not present

## 2019-07-18 DIAGNOSIS — E790 Hyperuricemia without signs of inflammatory arthritis and tophaceous disease: Secondary | ICD-10-CM | POA: Diagnosis not present

## 2019-07-18 DIAGNOSIS — E782 Mixed hyperlipidemia: Secondary | ICD-10-CM | POA: Diagnosis not present

## 2019-07-18 DIAGNOSIS — E669 Obesity, unspecified: Secondary | ICD-10-CM

## 2019-07-18 MED ORDER — LEVOTHYROXINE SODIUM 137 MCG PO TABS: 137.0000 ug | ORAL_TABLET | Freq: Every day | ORAL | 1 refills | Status: DC

## 2019-07-18 NOTE — Progress Notes (Signed)
Patient ID: Colin Wood, male   DOB: 1961-01-30, 59 y.o.   MRN: [id number]           Reason for Appointment: Follow-up for Type 2 Diabetes  Referring physician: Larose Kells  History of Present Illness:          Diagnosis: Type 2 diabetes mellitus, date of diagnosis: ?  2011        Past history: He was not having any symptoms at the time of diagnosis and not clear what his initial blood sugars were He was started on metformin initially and had fairly good control Subsequently with higher sugars he was given Amaryl also in addition which has been continued.  A1c was 10.6% in 2012 Subsequently in 2014 his A1c was down to 6.5 but he had a regular follow-up subsequently Because of an A1c of 8.1 in 02/2014 he was given Tradjenta in addition to the above drugs but this was not effective  He was referred here because of a high A1c of 7.9 in 07/2014  Recent history:   Oral hypoglycemic drugs the patient is taking are: metformin 1 g twice a day, Trulicity 3.0 mg weekly, Jardiance 25 mg daily      A1c has been variable and now back down to 6.1 compared to 6.6   Current management, blood sugar values and problems identified:  He was changed to 3 mg of Trulicity on his last visit  With this he has had increased satiety and has lost 8 pounds; previously had gained weight  This is despite his saying that he is not planning his meals well again and probably getting more carbohydrates or fast food otherwise as before  He still does not find the time for exercise with his work schedule  Has only a few blood sugars at home, mostly in the morning, lab glucose 119  No nausea with 3 mg Ozempic  Meals are not planning well and is usually going to a fast food biscuit place in the morning  He does take his Metformin regularly         Side effects from medications have been:None  Compliance with the medical regimen: Fair   Glucose monitoring:  done less than once a day       Glucometer:   One  Touch.      Blood Glucose readings by review of monitor :   RECENT blood sugar range 117-174 AVERAGE 121 in the morning  Previous blood sugar range 114-165, mostly morning readings and average 144  Self-care: He has difficulty controlling portions and carbohydrates at times, sometimes getting biscuits in the morning  Will snack on apples and yogurt           Dietician visit, most recent: none, previously had gone to a class          Weight history: Previous range 260-280  Wt Readings from Last 3 Encounters:  07/18/19 229 lb 12.8 oz (104.2 kg)  04/20/19 237 lb 6.4 oz (107.7 kg)  04/18/19 236 lb 2 oz (107.1 kg)    Glycemic control:   Lab Results  Component Value Date   HGBA1C 6.1 (H) 07/11/2019   HGBA1C 6.6 (H) 03/21/2019   HGBA1C 6.0 (H) 11/17/2018   Lab Results  Component Value Date   LDLCALC 93 07/11/2019   CREATININE 1.06 07/11/2019     Allergies as of 07/18/2019   No Known Allergies     Medication List       Accurate as of July 18, 2019  2:05 PM. If you have any questions, ask your nurse or doctor.        allopurinol 300 MG tablet Commonly known as: ZYLOPRIM Take 1 tablet (300 mg total) by mouth daily.   atorvastatin 10 MG tablet Commonly known as: LIPITOR TAKE 1 TABLET(10 MG) BY MOUTH DAILY   docusate sodium 100 MG capsule Commonly known as: COLACE Take 100 mg by mouth daily.   famotidine 20 MG tablet Commonly known as: PEPCID Take 1 tablet (20 mg total) by mouth at bedtime.   fenofibrate 160 MG tablet Take 1 tablet (160 mg total) by mouth daily.   glucose blood test strip Commonly known as: OneTouch Verio Use as instructed to check blood sugar 3 times daily.   Jardiance 25 MG Tabs tablet Generic drug: empagliflozin TAKE 1 TABLET BY MOUTH DAILY   levothyroxine 175 MCG tablet Commonly known as: SYNTHROID TAKE 1 TABLET(175 MCG) BY MOUTH DAILY   lisinopril 5 MG tablet Commonly known as: ZESTRIL Take 1 tablet (5 mg total) by mouth  daily.   metFORMIN 1000 MG tablet Commonly known as: GLUCOPHAGE TAKE 1 TABLET BY MOUTH TWICE DAILY WITH MEALS   metoprolol succinate 25 MG 24 hr tablet Commonly known as: TOPROL-XL TAKE 1 TABLET BY MOUTH DAILY   multivitamin tablet Take 1 tablet by mouth daily.   OneTouch Delica Plus 123456 Misc USE LANCETS TO CHECK BLOOD SUGAR THREE TIMES DAILY   Trulicity 3 0000000 Sopn Generic drug: Dulaglutide Inject 3 mg into the skin once a week. Inject 3mg  under the skin once weekly.   Vascepa 1 g capsule Generic drug: icosapent Ethyl TAKE 2 CAPSULES BY MOUTH TWICE DAILY   VITAMIN C PO Take 2 tablets by mouth 2 (two) times daily.   Vitamin D3 50 MCG (2000 UT) capsule   Xarelto 20 MG Tabs tablet Generic drug: rivaroxaban TAKE 1 TABLET(20 MG) BY MOUTH DAILY WITH SUPPER       Allergies:  No Known Allergies  Past Medical History:  Diagnosis Date  . Anxiety   . Atrial flutter ([place])    typical appearing; s/p ablation 12-2012  . Constipation   . Depression    used to see psych, on no meds as of 03/2009  . Diabetes mellitus    TYPE 2  . Fatty liver   . GERD (gastroesophageal reflux disease)   . Gout   . Hyperlipidemia   . Hypothyroidism   . Leg edema   . Obesity   . Persistent atrial fibrillation ([place])    PVI 12/2012  . Prediabetes   . Rhinitis    vasomotor  . S/P Minimally invasive maze operation for atrial fibrillation 03/12/2016   Complete bilateral atrial lesion set using cryothermy and bipolar radiofrequency ablation with clipping of LA appendage via right mini thoracotomy approach  . S/P patent foramen ovale closure 03/12/2016  . Sleep apnea    mild, now treated with CPAP since ~9-12    Past Surgical History:  Procedure Laterality Date  . ABLATION OF DYSRHYTHMIC FOCUS  01/05/2013   PVI and flutter ablation by [name]  . ATRIAL FIBRILLATION ABLATION N/A 01/05/2013   Procedure: ATRIAL FIBRILLATION ABLATION;  Surgeon: Klinker Grayer, MD;  Location: [facility] CATH  LAB;  Service: Cardiovascular;  Laterality: N/A;  . CARDIAC CATHETERIZATION N/A 01/12/2016   Procedure: Right/Left Heart Cath and Coronary Angiography;  Surgeon: [name], MD;  Location: [facility] CV LAB;  Service: Cardiovascular;  Laterality: N/A;  . CARDIOVERSION  03/01/11  .  CLIPPING OF ATRIAL APPENDAGE Left 03/12/2016   Procedure: CLIPPING OF ATRIAL APPENDAGE;  Surgeon: [name], MD;  Location: [place];  Service: Open Heart Surgery;  Laterality: Left;  . MINIMALLY INVASIVE MAZE PROCEDURE N/A 03/12/2016   Procedure: MINIMALLY INVASIVE MAZE PROCEDURE;  Surgeon: [name], MD;  Location: [place];  Service: Open Heart Surgery;  Laterality: N/A;  . NASAL SINUS SURGERY    . TEE WITHOUT CARDIOVERSION N/A 01/04/2013   Procedure: TRANSESOPHAGEAL ECHOCARDIOGRAM (TEE);  Surgeon: [name], MD;  Location: [facility] ENDOSCOPY;  Service: Cardiovascular;  Laterality: N/A;  . TEE WITHOUT CARDIOVERSION N/A 03/12/2016   Procedure: TRANSESOPHAGEAL ECHOCARDIOGRAM (TEE);  Surgeon: [name], MD;  Location: [place];  Service: Open Heart Surgery;  Laterality: N/A;  . TONSILLECTOMY    . VASECTOMY      Family History  Problem Relation Age of Onset  . Asthma Mother   . Breast cancer Mother   . Dementia Mother   . Diabetes Father   . Hyperlipidemia Father   . Obesity Father   . Atrial fibrillation Father   . Hypertension Brother   . Melanoma Brother   . Allergies Daughter   . Throat cancer Maternal Uncle   . Colon cancer Paternal Grandfather   . Prostate cancer Neg Hx     Social History:  reports that he has never smoked. He has never used smokeless tobacco. He reports that he does not drink alcohol or use drugs.    Review of Systems         Lipids:  has high triglycerides mostly previously as high as 681; LDL previously also above 100 at baseline.   On Fenofibrate and add Vascepa for triglycerides, also taking Lipitor 10 mg daily  His LDL is well controlled and triglycerides are  normal with slightly improved HDL        Lab Results  Component Value Date   CHOL 169 07/11/2019   HDL 50 07/11/2019   LDLCALC 93 07/11/2019   TRIG 146 07/11/2019   CHOLHDL 3.4 07/11/2019                   Thyroid: He was diagnosed to have hypothyroidism in 1994  He is compliant with his generic levothyroxine every morning daily  Has been on levothyroxine 175 mcg, 6 tablets per week His TSH is now again slightly low  Again not clear why his free T4 is consistently high, his only vitamin is [employer]   Lab Results  Component Value Date   TSH 0.369 (L) 07/11/2019   TSH 0.503 03/21/2019   TSH 0.258 (L) 11/17/2018   FREET4 1.93 (H) 07/11/2019   FREET4 2.09 (H) 03/21/2019   FREET4 1.86 (H) 11/17/2018       The blood pressure has been treated by cardiologist with low-dose lisinopril and metoprolol   BP Readings from Last 3 Encounters:  07/18/19 110/68  04/20/19 108/70  04/18/19 114/74   Vitamin D deficiency: Has been prescribed vitamin D weekly with levels as low as 25  Lab Results  Component Value Date   VD25OH 33.6 07/11/2019   VD25OH 25.1 (L) 03/21/2019   VD25OH 38.9 03/29/2018   VD25OH 40.7 08/05/2017     HYPERCALCEMIA: The calcium continues to be slightly high or upper normal, now calcium corrected for albumin is 10.3 again No history of kidney stones or known osteopenia  Lab Results  Component Value Date   CALCIUM 10.5 (H) 07/11/2019   CALCIUM 10.3 (H) 03/21/2019  CALCIUM 10.3 (H) 11/17/2018   CALCIUM 10.4 (H) 07/20/2018   Lab Results  Component Value Date   PTH 26 07/11/2019   CALCIUM 10.5 (H) 07/11/2019   CAION 1.15 03/13/2016    Previously was having decreased libido and motivation and testosterone level was normal in 2019  He has been prescribed allopurinol by PCP  Physical Examination:  BP 110/68 (BP Location: Left Arm, Patient Position: Sitting, Cuff Size: Normal)   Pulse 84   Ht 6\' 1"  (1.854 m)   Wt 229 lb 12.8 oz (104.2 kg)   SpO2  98%   BMI 30.32 kg/m      ASSESSMENT:  Diabetes type 2, with mild obesity  He is on a 3 drug regimen of metformin, Trulicity 3.0 mg now and Jardiance 25 mg  See history of present illness for discussion of his current management, blood sugar patterns and problems identified.    His A1c is 6.1 compared to 6.6 %  He has clearly benefited from increasing his Trulicity up to 3 mg with better A1c and weight loss This is despite his not trying to improve his diet or start any exercise routine Has more satiety and has no side effects with Trulicity Glucose monitoring at home is minimal Again motivation level is low and he still has difficulty finding time because of long work hours  Hypercalcemia likely to be from mild hyperparathyroidism, stable at 10.3  PTH above 20 now Also has been supplemented with vitamin D with adequate level down Continue observation only  HYPOTHYROIDISM: TSH slightly below normal Free T4 is high likely to be from biotin interference, he is asymptomatic  He currently is taking 175 mcg, 6 tablets a week  All his lab results were discussed in detail  Mild hypertension: Well controlled and creatinine is stable Also microalbumin normal  LIPIDS: Excellent control  PLAN:   Continue TRULICITY 3 mg weekly and Jardiance Discussed importance of starting an exercise program including home video exercises He will try to avoid fast food More consistent monitoring of blood sugars after meals and discussed importance of achieving postprandial targets also  Given information about low saturated fat diet  Continue vitamin D3, 2000 units daily  Levothyroxine will be reduced to 137 mcg since he is taking the equivalent of 150 now He can leave off his syndrome for couple of weeks before his next labs  Check uric acid on the next visit  Follow-up in 4 months  Patient Instructions  New thyroid Rx, no Centrum   Exercise 10 min daily.  Check blood sugars on  waking up 2-3  days a week  Also check blood sugars about 2 hours after meals and do this after different meals by rotation  Recommended blood sugar levels on waking up are 80-130 and about 2 hours after meal is 130-160  Please bring your blood sugar monitor to each visit, thank you        Elayne Snare 07/18/2019, 2:05 PM   Note: This office note was prepared with Dragon voice recognition system technology. Any transcriptional errors that result from this process are unintentional.

## 2019-07-18 NOTE — Patient Instructions (Addendum)
New thyroid Rx, no Centrum   Exercise 10 min daily.  Check blood sugars on waking up 2-3  days a week  Also check blood sugars about 2 hours after meals and do this after different meals by rotation  Recommended blood sugar levels on waking up are 80-130 and about 2 hours after meal is 130-160  Please bring your blood sugar monitor to each visit, thank you

## 2019-08-16 LAB — HM DIABETES EYE EXAM

## 2019-08-31 ENCOUNTER — Other Ambulatory Visit: Payer: Self-pay | Admitting: Endocrinology

## 2019-09-17 ENCOUNTER — Other Ambulatory Visit: Payer: Self-pay | Admitting: Endocrinology

## 2019-09-20 ENCOUNTER — Telehealth: Payer: Self-pay

## 2019-09-20 NOTE — Telephone Encounter (Signed)
PA initiated via [email or web address] for Vascepa tablets.  Winfred Leeds (Key: Colbert Ewing) Rx #: [id number] Vascepa 1GM capsules   Form [employer] Electronic Prior Authorization Form (2017 NCPDP) Created 6 hours ago Sent to Plan 3 minutes ago Plan Response 3 minutes ago Submit Clinical Questions 2 minutes ago Determination Wait for Determination Please wait for [facility] 2017 NCPDP to return a determination.

## 2019-10-13 ENCOUNTER — Other Ambulatory Visit: Payer: Self-pay | Admitting: Internal Medicine

## 2019-10-15 NOTE — Telephone Encounter (Signed)
This is a [facility]ib clinic pt 

## 2019-10-16 ENCOUNTER — Other Ambulatory Visit: Payer: Self-pay | Admitting: Internal Medicine

## 2019-10-16 NOTE — Telephone Encounter (Signed)
Prescription refill request for Xarelto received.   Last office visit: Colin Wood 04/20/2019 Weight: 104.2 kg Age: 59 y.o. Scr: 1.06, 07/11/2019 CrCl: 110 ml/min   Prescription refill sent.

## 2019-10-22 ENCOUNTER — Other Ambulatory Visit: Payer: Self-pay

## 2019-10-23 MED ORDER — LISINOPRIL 5 MG PO TABS: 5.0000 mg | ORAL_TABLET | Freq: Every day | ORAL | 0 refills | Status: DC

## 2019-11-05 ENCOUNTER — Other Ambulatory Visit: Payer: Self-pay

## 2019-11-05 MED ORDER — METOPROLOL SUCCINATE ER 25 MG PO TB24: 25.0000 mg | ORAL_TABLET | Freq: Every day | ORAL | 1 refills | Status: DC

## 2019-11-14 ENCOUNTER — Other Ambulatory Visit (INDEPENDENT_AMBULATORY_CARE_PROVIDER_SITE_OTHER): Payer: 59

## 2019-11-14 ENCOUNTER — Other Ambulatory Visit: Payer: Self-pay

## 2019-11-14 DIAGNOSIS — E790 Hyperuricemia without signs of inflammatory arthritis and tophaceous disease: Secondary | ICD-10-CM | POA: Diagnosis not present

## 2019-11-14 DIAGNOSIS — E063 Autoimmune thyroiditis: Secondary | ICD-10-CM

## 2019-11-14 DIAGNOSIS — E782 Mixed hyperlipidemia: Secondary | ICD-10-CM

## 2019-11-14 DIAGNOSIS — E669 Obesity, unspecified: Secondary | ICD-10-CM

## 2019-11-15 LAB — COMPREHENSIVE METABOLIC PANEL
ALT: 16 IU/L (ref 0–44)
AST: 16 IU/L (ref 0–40)
Albumin/Globulin Ratio: 2.1 (ref 1.2–2.2)
Albumin: 4.8 g/dL (ref 3.8–4.9)
Alkaline Phosphatase: 41 IU/L — ABNORMAL LOW (ref 48–121)
BUN/Creatinine Ratio: 18 (ref 10–24)
BUN: 19 mg/dL (ref 8–27)
Bilirubin Total: 0.4 mg/dL (ref 0.0–1.2)
CO2: 23 mmol/L (ref 20–29)
Calcium: 10.3 mg/dL — ABNORMAL HIGH (ref 8.6–10.2)
Chloride: 103 mmol/L (ref 96–106)
Creatinine, Ser: 1.05 mg/dL (ref 0.76–1.27)
GFR calc Af Amer: 89 mL/min/{1.73_m2} (ref >59)
GFR calc non Af Amer: 77 mL/min/{1.73_m2} (ref >59)
Globulin, Total: 2.3 g/dL (ref 1.5–4.5)
Glucose: 108 mg/dL — ABNORMAL HIGH (ref 65–99)
Potassium: 4.8 mmol/L (ref 3.5–5.2)
Sodium: 146 mmol/L — ABNORMAL HIGH (ref 134–144)
Total Protein: 7.1 g/dL (ref 6.0–8.5)

## 2019-11-15 LAB — LIPID PANEL
Chol/HDL Ratio: 3.2 ratio (ref 0.0–5.0)
Cholesterol, Total: 151 mg/dL (ref 100–199)
HDL: 47 mg/dL (ref >39)
LDL Chol Calc (NIH): 86 mg/dL (ref 0–99)
Triglycerides: 96 mg/dL (ref 0–149)
VLDL Cholesterol Cal: 18 mg/dL (ref 5–40)

## 2019-11-15 LAB — URIC ACID: Uric Acid: 4.6 mg/dL (ref 3.8–8.4)

## 2019-11-15 LAB — TSH: TSH: 1.58 u[IU]/mL (ref 0.450–4.500)

## 2019-11-15 LAB — HEMOGLOBIN A1C
Est. average glucose Bld gHb Est-mCnc: 120 mg/dL
Hgb A1c MFr Bld: 5.8 % — ABNORMAL HIGH (ref 4.8–5.6)

## 2019-11-15 LAB — T4, FREE: Free T4: 1.86 ng/dL — ABNORMAL HIGH (ref 0.82–1.77)

## 2019-11-17 ENCOUNTER — Other Ambulatory Visit: Payer: Self-pay | Admitting: Endocrinology

## 2019-11-20 NOTE — Progress Notes (Signed)
Patient ID: Colin Wood, male   DOB: [birth year], 59 y.o.   MRN: [id number]           Reason for Appointment: Follow-up for various problems  Referring physician: Larose Kells  History of Present Illness:          Diagnosis: Type 2 diabetes mellitus, date of diagnosis: ?  2011        Past history: He was not having any symptoms at the time of diagnosis and not clear what his initial blood sugars were He was started on metformin initially and had fairly good control Subsequently with higher sugars he was given Amaryl also in addition which has been continued.  A1c was 10.6% in 2012 Subsequently in 2014 his A1c was down to 6.5 but he had a regular follow-up subsequently Because of an A1c of 8.1 in 02/2014 he was given Tradjenta in addition to the above drugs but this was not effective  He was referred here because of a high A1c of 7.9 in 07/2014  Recent history:   Oral hypoglycemic drugs the patient is taking are: metformin 1 g twice a day, Trulicity 3.0 mg weekly, Jardiance 25 mg daily      A1c has come down progressively and now down to 5.8  Current management, blood sugar values and problems identified:  He was changed to 3 mg of Trulicity in 41/66 A6T was 6.6  With this he has had further improvement in his diabetes control and weight  Blood sugars at home are excellent although again he is doing primarily morning readings  Has only a few blood sugars at home, mostly in the morning, lab glucose 119  No nausea with 3 mg Ozempic  He only does some physical activity like lawnmowing but no more exercise         Side effects from medications have been:None  Compliance with the medical regimen: Fair   Glucose monitoring:  done less than once a day       Glucometer:   One Touch.      Blood Glucose readings by review of monitor :   AVERAGE 112, morning range 101-127, evening 97  PREVIOUS blood sugar range 117-174 AVERAGE 121 in the morning    Self-care: Meal planning is fair,  sometimes getting biscuits in the morning  Will snack on apples and yogurt           Dietician visit, most recent: none, previously had gone to a class          Weight history: Previous range 260-280  Wt Readings from Last 3 Encounters:  11/21/19 226 lb 9.6 oz (102.8 kg)  07/18/19 229 lb 12.8 oz (104.2 kg)  04/20/19 237 lb 6.4 oz (107.7 kg)    Glycemic control:   Lab Results  Component Value Date   HGBA1C 5.8 (H) 11/14/2019   HGBA1C 6.1 (H) 07/11/2019   HGBA1C 6.6 (H) 03/21/2019   Lab Results  Component Value Date   LDLCALC 86 11/14/2019   CREATININE 1.05 11/14/2019     Allergies as of 11/21/2019   No Known Allergies     Medication List       Accurate as of November 21, 2019  8:28 AM. If you have any questions, ask your nurse or doctor.        allopurinol 300 MG tablet Commonly known as: ZYLOPRIM Take 1 tablet (300 mg total) by mouth daily.   atorvastatin 10 MG tablet Commonly known as: LIPITOR TAKE 1 TABLET(10  MG) BY MOUTH DAILY   docusate sodium 100 MG capsule Commonly known as: COLACE Take 100 mg by mouth daily.   famotidine 20 MG tablet Commonly known as: PEPCID Take 1 tablet (20 mg total) by mouth at bedtime.   fenofibrate 160 MG tablet Take 1 tablet (160 mg total) by mouth daily.   glucose blood test strip Commonly known as: OneTouch Verio Use as instructed to check blood sugar 3 times daily.   Jardiance 25 MG Tabs tablet Generic drug: empagliflozin TAKE 1 TABLET BY MOUTH DAILY   levothyroxine 137 MCG tablet Commonly known as: SYNTHROID Take 1 tablet (137 mcg total) by mouth daily before breakfast.   lisinopril 5 MG tablet Commonly known as: ZESTRIL Take 1 tablet (5 mg total) by mouth daily. Please make overdue appt with [name] before anymore refills. 1st attempt   metFORMIN 1000 MG tablet Commonly known as: GLUCOPHAGE TAKE 1 TABLET BY MOUTH TWICE DAILY WITH MEALS   metoprolol succinate 25 MG 24 hr tablet Commonly known as:  TOPROL-XL Take 1 tablet (25 mg total) by mouth daily.   multivitamin tablet Take 1 tablet by mouth daily.   OneTouch Delica Plus [id number] Misc USE LANCETS TO CHECK BLOOD SUGAR THREE TIMES DAILY   Trulicity 3 JK/9.3OI Sopn Generic drug: Dulaglutide INJECT 3 MG INTO THE SKIN ONCE A WEEK   Vascepa 1 g capsule Generic drug: icosapent Ethyl TAKE 2 CAPSULES BY MOUTH TWICE DAILY   VITAMIN C PO Take 2 tablets by mouth 2 (two) times daily.   Vitamin D3 50 MCG (2000 UT) capsule   Xarelto 20 MG Tabs tablet Generic drug: rivaroxaban TAKE 1 TABLET(20 MG) BY MOUTH DAILY WITH SUPPER       Allergies:  No Known Allergies  Past Medical History:  Diagnosis Date   Anxiety    Atrial flutter ([place])    typical appearing; s/p ablation 12-2012   Constipation    Depression    used to see psych, on no meds as of 03/2009   Diabetes mellitus    TYPE 2   Fatty liver    GERD (gastroesophageal reflux disease)    Gout    Hyperlipidemia    Hypothyroidism    Leg edema    Obesity    Persistent atrial fibrillation ([place])    PVI 12/2012   Prediabetes    Rhinitis    vasomotor   S/P Minimally invasive maze operation for atrial fibrillation 03/12/2016   Complete bilateral atrial lesion set using cryothermy and bipolar radiofrequency ablation with clipping of LA appendage via right mini thoracotomy approach   S/P patent foramen ovale closure 03/12/2016   Sleep apnea    mild, now treated with CPAP since ~9-12    Past Surgical History:  Procedure Laterality Date   ABLATION OF DYSRHYTHMIC FOCUS  01/05/2013   PVI and flutter ablation by [name]   ATRIAL FIBRILLATION ABLATION N/A 01/05/2013   Procedure: ATRIAL FIBRILLATION ABLATION;  Surgeon: Mcnee Grayer, MD;  Location: [facility] CATH LAB;  Service: Cardiovascular;  Laterality: N/A;   CARDIAC CATHETERIZATION N/A 01/12/2016   Procedure: Right/Left Heart Cath and Coronary Angiography;  Surgeon: [name], MD;  Location: [place] CV LAB;  Service: Cardiovascular;  Laterality: N/A;   CARDIOVERSION  03/01/11   CLIPPING OF ATRIAL APPENDAGE Left 03/12/2016   Procedure: CLIPPING OF ATRIAL APPENDAGE;  Surgeon: [name], MD;  Location: [place];  Service: Open Heart Surgery;  Laterality: Left;   MINIMALLY INVASIVE MAZE PROCEDURE N/A 03/12/2016  Procedure: MINIMALLY INVASIVE MAZE PROCEDURE;  Surgeon: [name], MD;  Location: [place];  Service: Open Heart Surgery;  Laterality: N/A;   NASAL SINUS SURGERY     TEE WITHOUT CARDIOVERSION N/A 01/04/2013   Procedure: TRANSESOPHAGEAL ECHOCARDIOGRAM (TEE);  Surgeon: [name], MD;  Location[facility] ENDOSCOPY;  Service: Cardiovascular;  Laterality: N/A;   TEE WITHOUT CARDIOVERSION N/A 03/12/2016   Procedure: TRANSESOPHAGEAL ECHOCARDIOGRAM (TEE);  Surgeon: [name], MD;  Location: [place];  Service: Open Heart Surgery;  Laterality: N/A;   TONSILLECTOMY     VASECTOMY      Family History  Problem Relation Age of Onset   Asthma Mother    Breast cancer Mother    Dementia Mother    Diabetes Father    Hyperlipidemia Father    Obesity Father    Atrial fibrillation Father    Hypertension Brother    Melanoma Brother    Allergies Daughter    Throat cancer Maternal Uncle    Colon cancer Paternal Grandfather    Prostate cancer Neg Hx     Social History:  reports that he has never smoked. He has never used smokeless tobacco. He reports that he does not drink alcohol and does not use drugs.    Review of Systems         Lipids:  has high triglycerides mostly previously as high as 681; LDL previously also above 100 at baseline.   On Fenofibrate and add Vascepa for triglycerides, also taking Lipitor 10 mg daily  His LDL is well controlled and triglycerides are normal again        Lab Results  Component Value Date   CHOL 151 11/14/2019   HDL 47 11/14/2019   LDLCALC 86 11/14/2019   TRIG 96 11/14/2019   CHOLHDL 3.2 11/14/2019                     HYPOTHYROIDISM: He was diagnosed to have primary hypothyroidism in 1994  He is consistent with his generic levothyroxine every morning before eating  Has been on levothyroxine 137 mcg daily now, previously TSH low TSH back to normal but free T4 tends to be higher, possibly from using his Centrum vitamin  Lab Results  Component Value Date   TSH 1.580 11/14/2019   TSH 0.369 (L) 07/11/2019   TSH 0.503 03/21/2019   FREET4 1.86 (H) 11/14/2019   FREET4 1.93 (H) 07/11/2019   FREET4 2.09 (H) 03/21/2019       The blood pressure has been treated by cardiologist with low-dose lisinopril and metoprolol   BP Readings from Last 3 Encounters:  11/21/19 128/72  07/18/19 110/68  04/20/19 108/70   Vitamin D deficiency: Has been prescribed vitamin D weekly with levels as low as 25  Lab Results  Component Value Date   VD25OH 33.6 07/11/2019   VD25OH 25.1 (L) 03/21/2019   VD25OH 38.9 03/29/2018   VD25OH 40.7 08/05/2017     HYPERCALCEMIA: The calcium continues to be slightly high or upper normal, now calcium corrected for albumin is 10.3 again No history of kidney stones or known osteopenia  Lab Results  Component Value Date   CALCIUM 10.3 (H) 11/14/2019   CALCIUM 10.5 (H) 07/11/2019   CALCIUM 10.3 (H) 03/21/2019   CALCIUM 10.3 (H) 11/17/2018   Lab Results  Component Value Date   PTH 26 07/11/2019   CALCIUM 10.3 (H) 11/14/2019   CAION 1.15 03/13/2016    Previously was having decreased libido and motivation  and testosterone level was normal in 2019  He has been prescribed allopurinol by PCP  Physical Examination:  BP 128/72 (BP Location: Left Arm, Patient Position: Sitting, Cuff Size: Normal)    Pulse 80    Ht 6\' 1"  (1.854 m)    Wt 226 lb 9.6 oz (102.8 kg)    SpO2 96%    BMI 29.90 kg/m      ASSESSMENT:  Diabetes type 2, with mild obesity  He is on a 3 drug regimen of metformin, Trulicity 3.0 mg and Jardiance 25 mg  See history of present illness for discussion  of his current management, blood sugar patterns and problems identified.    His A1c is 5.8 and at his best level  With higher doses of Trulicity he has had better control and some weight loss However still can do better with consistent diet and exercise  HYPERPARATHYROIDISM: Asymptomatic, calcium stable at 10.3 We will continue observation only  HYPOTHYROIDISM: TSH back to normal Free T4 is high likely to be from biotin interference, he is asymptomatic  He currently is taking 137 mcg and will continue  Lipids: Well-controlled  Mild hypertension: Well controlled and creatinine is stable  He is asking about testing for Covid antibody before taking the vaccine However stressed to him the need for doing the vaccine regardless of whether or not he has had many antibody levels for developing proper immunity and long-term benefit especially against variants  History of gout: Uric acid is well controlled, to continue follow-up with PCP  PLAN:   As above Recommended regular exercise at least on weekends More blood sugar readings after dinner and not just in the morning  He agrees to go ahead and have his Covid vaccine which was strongly recommended   Follow-up in 4 months  There are no Patient Instructions on file for this visit.     Elayne Snare 11/21/2019, 8:28 AM   Note: This office note was prepared with Dragon voice recognition system technology. Any transcriptional errors that result from this process are unintentional.

## 2019-11-21 ENCOUNTER — Encounter: Payer: Self-pay | Admitting: Endocrinology

## 2019-11-21 ENCOUNTER — Other Ambulatory Visit: Payer: Self-pay

## 2019-11-21 ENCOUNTER — Ambulatory Visit: Payer: 59 | Admitting: Endocrinology

## 2019-11-21 VITALS — BP 128/72 | HR 80 | Ht 73.0 in | Wt 226.6 lb

## 2019-11-21 DIAGNOSIS — E063 Autoimmune thyroiditis: Secondary | ICD-10-CM | POA: Diagnosis not present

## 2019-11-21 DIAGNOSIS — E782 Mixed hyperlipidemia: Secondary | ICD-10-CM | POA: Diagnosis not present

## 2019-11-21 DIAGNOSIS — Z125 Encounter for screening for malignant neoplasm of prostate: Secondary | ICD-10-CM

## 2019-11-21 DIAGNOSIS — E1169 Type 2 diabetes mellitus with other specified complication: Secondary | ICD-10-CM

## 2019-11-21 DIAGNOSIS — E669 Obesity, unspecified: Secondary | ICD-10-CM

## 2019-11-21 DIAGNOSIS — E559 Vitamin D deficiency, unspecified: Secondary | ICD-10-CM | POA: Diagnosis not present

## 2019-11-22 ENCOUNTER — Other Ambulatory Visit: Payer: Self-pay | Admitting: Endocrinology

## 2019-11-22 DIAGNOSIS — E1169 Type 2 diabetes mellitus with other specified complication: Secondary | ICD-10-CM

## 2019-11-24 ENCOUNTER — Other Ambulatory Visit: Payer: Self-pay | Admitting: Endocrinology

## 2019-11-28 ENCOUNTER — Encounter: Payer: Self-pay | Admitting: Internal Medicine

## 2019-11-28 ENCOUNTER — Other Ambulatory Visit: Payer: Self-pay

## 2019-11-28 ENCOUNTER — Ambulatory Visit: Payer: 59 | Admitting: Internal Medicine

## 2019-11-28 VITALS — BP 110/68 | HR 78 | Ht 73.0 in | Wt 225.8 lb

## 2019-11-28 DIAGNOSIS — I4891 Unspecified atrial fibrillation: Secondary | ICD-10-CM | POA: Diagnosis not present

## 2019-11-28 DIAGNOSIS — G4733 Obstructive sleep apnea (adult) (pediatric): Secondary | ICD-10-CM

## 2019-11-28 DIAGNOSIS — Z9889 Other specified postprocedural states: Secondary | ICD-10-CM | POA: Diagnosis not present

## 2019-11-28 DIAGNOSIS — D6869 Other thrombophilia: Secondary | ICD-10-CM

## 2019-11-28 DIAGNOSIS — Z8679 Personal history of other diseases of the circulatory system: Secondary | ICD-10-CM

## 2019-11-28 MED ORDER — METOPROLOL SUCCINATE ER 25 MG PO TB24
12.5000 mg | ORAL_TABLET | Freq: Every day | ORAL | 3 refills | Status: DC
Start: 2019-11-28 — End: 2020-03-20

## 2019-11-28 NOTE — Progress Notes (Signed)
PCP: Colon Branch, MD   Primary EP: [name]  Colin Wood is a 59 y.o. male who presents today for routine electrophysiology followup.  Since last being seen in our clinic, the patient reports doing very well.  Today, he denies symptoms of palpitations, chest pain, shortness of breath,  lower extremity edema, dizziness, presyncope, or syncope.  The patient is otherwise without complaint today.   Past Medical History:  Diagnosis Date  . Anxiety   . Atrial flutter ([place])    typical appearing; s/p ablation 12-2012  . Constipation   . Depression    used to see psych, on no meds as of 03/2009  . Diabetes mellitus    TYPE 2  . Fatty liver   . GERD (gastroesophageal reflux disease)   . Gout   . Hyperlipidemia   . Hypothyroidism   . Leg edema   . Obesity   . Persistent atrial fibrillation ([place])    PVI 12/2012  . Prediabetes   . Rhinitis    vasomotor  . S/P Minimally invasive maze operation for atrial fibrillation 03/12/2016   Complete bilateral atrial lesion set using cryothermy and bipolar radiofrequency ablation with clipping of LA appendage via right mini thoracotomy approach  . S/P patent foramen ovale closure 03/12/2016  . Sleep apnea    mild, now treated with CPAP since ~9-12   Past Surgical History:  Procedure Laterality Date  . ABLATION OF DYSRHYTHMIC FOCUS  01/05/2013   PVI and flutter ablation by [name]  . ATRIAL FIBRILLATION ABLATION N/A 01/05/2013   Procedure: ATRIAL FIBRILLATION ABLATION;  Surgeon: Christensen Grayer, MD;  Location: [facility] CATH LAB;  Service: Cardiovascular;  Laterality: N/A;  . CARDIAC CATHETERIZATION N/A 01/12/2016   Procedure: Right/Left Heart Cath and Coronary Angiography;  Surgeon: [name], MD;  Location: [place] CV LAB;  Service: Cardiovascular;  Laterality: N/A;  . CARDIOVERSION  03/01/11  . CLIPPING OF ATRIAL APPENDAGE Left 03/12/2016   Procedure: CLIPPING OF ATRIAL APPENDAGE;  Surgeon: [name], MD;  Location: [place];  Service:  Open Heart Surgery;  Laterality: Left;  . MINIMALLY INVASIVE MAZE PROCEDURE N/A 03/12/2016   Procedure: MINIMALLY INVASIVE MAZE PROCEDURE;  Surgeon: [name], MD;  Location: [place];  Service: Open Heart Surgery;  Laterality: N/A;  . NASAL SINUS SURGERY    . TEE WITHOUT CARDIOVERSION N/A 01/04/2013   Procedure: TRANSESOPHAGEAL ECHOCARDIOGRAM (TEE);  Surgeon: [name], MD;  Location: [facility] ENDOSCOPY;  Service: Cardiovascular;  Laterality: N/A;  . TEE WITHOUT CARDIOVERSION N/A 03/12/2016   Procedure: TRANSESOPHAGEAL ECHOCARDIOGRAM (TEE);  Surgeon: [name], MD;  Location: [place];  Service: Open Heart Surgery;  Laterality: N/A;  . TONSILLECTOMY    . VASECTOMY      ROS- all systems are reviewed and negatives except as per HPI above  Current Outpatient Medications  Medication Sig Dispense Refill  . allopurinol (ZYLOPRIM) 300 MG tablet Take 1 tablet (300 mg total) by mouth daily. 90 tablet 3  . Ascorbic Acid (VITAMIN C PO) Take 2 tablets by mouth 2 (two) times daily.    [name] atorvastatin (LIPITOR) 10 MG tablet TAKE 1 TABLET(10 MG) BY MOUTH DAILY 90 tablet 0  . Cholecalciferol (VITAMIN D3) 50 MCG (2000 UT) capsule     . famotidine (PEPCID) 20 MG tablet Take 1 tablet (20 mg total) by mouth at bedtime. 90 tablet 3  . fenofibrate 160 MG tablet Take 1 tablet (160 mg total) by mouth daily. 90 tablet 3  .  glucose blood (ONETOUCH VERIO) test strip Use as instructed to check blood sugar 3 times daily. 100 each 3  . JARDIANCE 25 MG TABS tablet TAKE 1 TABLET BY MOUTH DAILY 90 tablet 1  . Lancets (ONETOUCH DELICA PLUS [id number]) MISC USE LANCETS TO CHECK BLOOD SUGAR THREE TIMES DAILY 100 each 3  . levothyroxine (SYNTHROID) 137 MCG tablet Take 1 tablet (137 mcg total) by mouth daily before breakfast. 90 tablet 1  . lisinopril (ZESTRIL) 5 MG tablet Take 1 tablet (5 mg total) by mouth daily. Please make overdue appt with [name] before anymore refills. 1st attempt 90 tablet 0  . metFORMIN  (GLUCOPHAGE) 1000 MG tablet TAKE 1 TABLET BY MOUTH TWICE DAILY WITH MEALS 180 tablet 1  . metoprolol succinate (TOPROL-XL) 25 MG 24 hr tablet Take 1 tablet (25 mg total) by mouth daily. 90 tablet 1  . Multiple Vitamin (MULTIVITAMIN) tablet Take 1 tablet by mouth daily.    . sennosides-docusate sodium (SENOKOT-S) 8.6-50 MG tablet Take 1 tablet by mouth daily.    . TRULICITY 3 VH/8.4ON SOPN INJECT 3 MG INTO THE SKIN ONCE A WEEK 6 mL 0  . VASCEPA 1 g capsule TAKE 2 CAPSULES BY MOUTH TWICE DAILY 360 capsule 3  . XARELTO 20 MG TABS tablet TAKE 1 TABLET(20 MG) BY MOUTH DAILY WITH SUPPER 90 tablet 1   No current facility-administered medications for this visit.    Physical Exam: Vitals:   11/28/19 1633  BP: 110/68  Pulse: 78  SpO2: 95%  Weight: 225 lb 12.8 oz (102.4 kg)  Height: 6\' 1"  (1.854 m)    GEN- The patient is well appearing, alert and oriented x 3 today.   Head- normocephalic, atraumatic Eyes-  Sclera clear, conjunctiva pink Ears- hearing intact Oropharynx- clear Lungs- Clear to ausculation bilaterally, normal work of breathing Heart- Regular rate and rhythm, no murmurs, rubs or gallops, PMI not laterally displaced GI- soft, NT, ND, + BS Extremities- no clubbing, cyanosis, or edema  Wt Readings from Last 3 Encounters:  11/28/19 225 lb 12.8 oz (102.4 kg)  11/21/19 226 lb 9.6 oz (102.8 kg)  07/18/19 229 lb 12.8 oz (104.2 kg)    EKG tracing ordered today is personally reviewed and shows sinus with RBBB  Assessment and Plan:  1. Persistent atrial fibrillation Doing well post MAZE and LAA clip Stop xarelto (chads2vasc score is 1, s/p LAA clip)  2. [name] Does not use CPAP because he thinks his [name] is resolved with weight loss He wants to lose 25 more lbs and then do a sleep study  3. Overweight Lifestyle modification advised He is making great progress with this  4. Tachycardia mediated CM EF is resolved with sinus Repeat echo  He has fatigue and erectile  dysfunction Reduce toprol to 12.5mg  daily today If his EF is normal, we will plan to stop toprol in 6-8 weeks (he is aware of this plan)  5. DM Stable No change required today Continue lisinopril for renal protection  Risks, benefits and potential toxicities for medications prescribed and/or refilled reviewed with patient today.   Surgeon Grayer MD, [facility] 11/28/2019 5:02 PM

## 2019-11-28 NOTE — Patient Instructions (Addendum)
Medication Instructions:  Your physician has recommended you make the following change in your medication:  1 Stop Xarelto 2 Reduce your Metoprolol Succinate 25mg - take 1/2 (12.5mg ) a tablet by mouth daily   *If you need a refill on your cardiac medications before your next appointment, please call your pharmacy*  Lab Work: None ordered.  If you have labs (blood work) drawn today and your tests are completely normal, you will receive your results only by: [name] MyChart Message (if you have MyChart) OR . A paper copy in the mail If you have any lab test that is abnormal or we need to change your treatment, we will call you to review the results.  Testing/Procedures: Your physician has requested that you have an echocardiogram. Echocardiography is a painless test that uses sound waves to create images of your heart. It provides your doctor with information about the size and shape of your heart and how well your heart's chambers and valves are working. This procedure takes approximately one hour. There are no restrictions for this procedure.  Please schedule ECHO  Follow-Up: At [facility], you and your health needs are our priority.  As part of our continuing mission to provide you with exceptional heart care, we have created designated Provider Care Teams.  These Care Teams include your primary Cardiologist (physician) and Advanced Practice Providers (APPs -  Physician Assistants and Nurse Practitioners) who all work together to provide you with the care you need, when you need it.  We recommend signing up for the patient portal called "MyChart".  Sign up information is provided on this After Visit Summary.  MyChart is used to connect with patients for Virtual Visits (Telemedicine).  Patients are able to view lab/test results, encounter notes, upcoming appointments, etc.  Non-urgent messages can be sent to your provider as well.   To learn more about what you can do with MyChart, go to  [email or web address].    Your next appointment:   Your physician wants you to follow-up in: 6 months with [name].   Other Instructions:

## 2019-11-30 NOTE — Addendum Note (Signed)
Addended by: Rose Phi on: 11/30/2019 08:13 AM   Modules accepted: Orders

## 2019-12-15 ENCOUNTER — Other Ambulatory Visit: Payer: Self-pay | Admitting: Endocrinology

## 2019-12-20 ENCOUNTER — Other Ambulatory Visit: Payer: Self-pay

## 2019-12-20 ENCOUNTER — Ambulatory Visit (HOSPITAL_COMMUNITY): Payer: 59 | Attending: Cardiology

## 2019-12-20 DIAGNOSIS — D6869 Other thrombophilia: Secondary | ICD-10-CM | POA: Diagnosis not present

## 2019-12-20 DIAGNOSIS — Z9889 Other specified postprocedural states: Secondary | ICD-10-CM | POA: Insufficient documentation

## 2019-12-20 DIAGNOSIS — G4733 Obstructive sleep apnea (adult) (pediatric): Secondary | ICD-10-CM | POA: Diagnosis not present

## 2019-12-20 DIAGNOSIS — I4891 Unspecified atrial fibrillation: Secondary | ICD-10-CM | POA: Insufficient documentation

## 2019-12-20 DIAGNOSIS — Z8679 Personal history of other diseases of the circulatory system: Secondary | ICD-10-CM | POA: Diagnosis present

## 2019-12-20 LAB — ECHOCARDIOGRAM COMPLETE
Area-P 1/2: 3.03 cm2
S' Lateral: 2.6 cm

## 2019-12-21 ENCOUNTER — Encounter: Payer: Self-pay | Admitting: Internal Medicine

## 2019-12-26 DIAGNOSIS — I4891 Unspecified atrial fibrillation: Secondary | ICD-10-CM

## 2020-01-11 ENCOUNTER — Other Ambulatory Visit: Payer: Self-pay | Admitting: Endocrinology

## 2020-01-15 ENCOUNTER — Telehealth: Payer: Self-pay | Admitting: Internal Medicine

## 2020-01-15 NOTE — Telephone Encounter (Signed)
Spoke with patient regarding appointment for Cardiac MRI scheduled Monday 02/11/20 at 8:00 am at [facility]---arrival time is 7:30 am--1st floor admissions office.  Patient states he received the information through My Chart.

## 2020-01-18 ENCOUNTER — Other Ambulatory Visit: Payer: Self-pay | Admitting: Internal Medicine

## 2020-02-08 ENCOUNTER — Telehealth (HOSPITAL_COMMUNITY): Payer: Self-pay | Admitting: Emergency Medicine

## 2020-02-08 NOTE — Telephone Encounter (Signed)
Pt returning phone call regarding upcoming cardiac imaging study; pt verbalizes understanding of appt date/time, parking situation and where to check in, and verified current allergies; name and call back number provided for further questions should they arise Marchia Bond RN Navigator Cardiac Imaging [place] [facility] [phone] office [phone] cell  Pt denies claustro. MAZE clip in heart (no other implants)

## 2020-02-08 NOTE — Telephone Encounter (Signed)
Attempted to call patient regarding upcoming cardiac MR appointment. Left message on voicemail with name and callback number Shell Blanchette RN Navigator Cardiac Imaging [facility] [phone] Office [phone] Cell  

## 2020-02-11 ENCOUNTER — Ambulatory Visit (HOSPITAL_COMMUNITY)
Admission: RE | Admit: 2020-02-11 | Discharge: 2020-02-11 | Disposition: A | Discharge status: Home / Self Care | Payer: 59 | Source: Ambulatory Visit | Attending: Internal Medicine | Admitting: Internal Medicine

## 2020-02-11 ENCOUNTER — Encounter (HOSPITAL_COMMUNITY): Payer: Self-pay

## 2020-02-11 ENCOUNTER — Other Ambulatory Visit: Payer: Self-pay

## 2020-02-11 DIAGNOSIS — I4891 Unspecified atrial fibrillation: Secondary | ICD-10-CM

## 2020-02-12 ENCOUNTER — Telehealth: Payer: Self-pay | Admitting: *Deleted

## 2020-02-12 NOTE — Telephone Encounter (Signed)
Spoke with patient to discuss rescheduling his Cardiac MRI from 02/11/20.  He is going to speak with his PCP regarding the cough that he has and will also ask for something to calm him down while he is in the MRI machine.  He states he will call back after that discussion to reschedule.

## 2020-02-14 ENCOUNTER — Other Ambulatory Visit: Payer: Self-pay

## 2020-02-14 ENCOUNTER — Encounter: Payer: Self-pay | Admitting: Internal Medicine

## 2020-02-14 ENCOUNTER — Telehealth (INDEPENDENT_AMBULATORY_CARE_PROVIDER_SITE_OTHER): Payer: 59 | Admitting: Internal Medicine

## 2020-02-14 VITALS — Ht 73.0 in | Wt 226.0 lb

## 2020-02-14 DIAGNOSIS — K219 Gastro-esophageal reflux disease without esophagitis: Secondary | ICD-10-CM

## 2020-02-14 DIAGNOSIS — J302 Other seasonal allergic rhinitis: Secondary | ICD-10-CM

## 2020-02-14 MED ORDER — AZELASTINE HCL 0.1 % NA SOLN
2.0000 | Freq: Every evening | NASAL | 3 refills | Status: DC | PRN
Start: 2020-02-14 — End: 2020-11-03

## 2020-02-14 MED ORDER — PANTOPRAZOLE SODIUM 40 MG PO TBEC: 40.0000 mg | DELAYED_RELEASE_TABLET | Freq: Every day | ORAL | 3 refills | Status: DC

## 2020-02-14 NOTE — Progress Notes (Signed)
Subjective:    Patient ID: Colin Wood, male    DOB: 24-May-1961, 59 y.o.   MRN: [id number]  DOS:  02/14/2020 Type of visit - description: Virtual Visit via Video Note  I connected with the above patient  by a video enabled telemedicine application and verified that I am speaking with the correct person using two identifiers.   THIS ENCOUNTER IS A VIRTUAL VISIT DUE TO COVID-19 - PATIENT WAS NOT SEEN IN THE OFFICE. PATIENT HAS CONSENTED TO VIRTUAL VISIT / TELEMEDICINE VISIT   Location of patient: home  Location of provider: office  Persons participating in the virtual visit: patient, provider   I discussed the limitations of evaluation and management by telemedicine and the availability of in person appointments. The patient expressed understanding and agreed to proceed.  Acute For the last few months GERD and allergy symptoms has resurfaced in the context of not taking medications for those conditions. Reports heartburn, throat burning. Also frequent postnasal dripping, typically clear. He coughs and he thinks is related to above. Recently attempted to do an MRI and had to lay down and could tolerate the position d/t cough.  Denies fever chills No dysphagia or odynophagia No sneezing, itchy eyes or itchy nose. No chest pain no difficulty breathing    Review of Systems See above   Past Medical History:  Diagnosis Date  . Anxiety   . Atrial flutter ([place])    typical appearing; s/p ablation 12-2012  . Constipation   . Depression    used to see psych, on no meds as of 03/2009  . Diabetes mellitus    TYPE 2  . Fatty liver   . GERD (gastroesophageal reflux disease)   . Gout   . Hyperlipidemia   . Hypothyroidism   . Leg edema   . Obesity   . Persistent atrial fibrillation ([place])    PVI 12/2012  . Prediabetes   . Rhinitis    vasomotor  . S/P Minimally invasive maze operation for atrial fibrillation 03/12/2016   Complete bilateral atrial lesion set using cryothermy  and bipolar radiofrequency ablation with clipping of LA appendage via right mini thoracotomy approach  . S/P patent foramen ovale closure 03/12/2016  . Sleep apnea    mild, now treated with CPAP since ~9-12    Past Surgical History:  Procedure Laterality Date  . ABLATION OF DYSRHYTHMIC FOCUS  01/05/2013   PVI and flutter ablation by [name]  . ATRIAL FIBRILLATION ABLATION N/A 01/05/2013   Procedure: ATRIAL FIBRILLATION ABLATION;  Surgeon: Kolodziejski Grayer, MD;  Location: [facility] CATH LAB;  Service: Cardiovascular;  Laterality: N/A;  . CARDIAC CATHETERIZATION N/A 01/12/2016   Procedure: Right/Left Heart Cath and Coronary Angiography;  Surgeon: [name], MD;  Location: [place] CV LAB;  Service: Cardiovascular;  Laterality: N/A;  . CARDIOVERSION  03/01/11  . CLIPPING OF ATRIAL APPENDAGE Left 03/12/2016   Procedure: CLIPPING OF ATRIAL APPENDAGE;  Surgeon: [name], MD;  Location: [place];  Service: Open Heart Surgery;  Laterality: Left;  . MINIMALLY INVASIVE MAZE PROCEDURE N/A 03/12/2016   Procedure: MINIMALLY INVASIVE MAZE PROCEDURE;  Surgeon: [name], MD;  Location: [place];  Service: Open Heart Surgery;  Laterality: N/A;  . NASAL SINUS SURGERY    . TEE WITHOUT CARDIOVERSION N/A 01/04/2013   Procedure: TRANSESOPHAGEAL ECHOCARDIOGRAM (TEE);  Surgeon: [name], MD;  Location: [facility] ENDOSCOPY;  Service: Cardiovascular;  Laterality: N/A;  . TEE WITHOUT CARDIOVERSION N/A 03/12/2016   Procedure: TRANSESOPHAGEAL ECHOCARDIOGRAM (  TEE);  Surgeon: [name], MD;  Location: [place];  Service: Open Heart Surgery;  Laterality: N/A;  . TONSILLECTOMY    . VASECTOMY      Allergies as of 02/14/2020   No Known Allergies     Medication List       Accurate as of February 14, 2020 11:59 PM. If you have any questions, ask your nurse or doctor.        allopurinol 300 MG tablet Commonly known as: ZYLOPRIM Take 1 tablet (300 mg total) by mouth daily.   atorvastatin 10 MG tablet Commonly  known as: LIPITOR TAKE 1 TABLET(10 MG) BY MOUTH DAILY   azelastine 0.1 % nasal spray Commonly known as: ASTELIN Place 2 sprays into both nostrils at bedtime as needed for rhinitis. Started by: [name], MD   famotidine 20 MG tablet Commonly known as: PEPCID Take 1 tablet (20 mg total) by mouth at bedtime.   fenofibrate 160 MG tablet Take 1 tablet (160 mg total) by mouth daily.   glucose blood test strip Commonly known as: OneTouch Verio Use as instructed to check blood sugar 3 times daily.   Jardiance 25 MG Tabs tablet Generic drug: empagliflozin TAKE 1 TABLET BY MOUTH DAILY   levothyroxine 137 MCG tablet Commonly known as: SYNTHROID TAKE 1 TABLET(137 MCG) BY MOUTH DAILY BEFORE BREAKFAST   lisinopril 5 MG tablet Commonly known as: ZESTRIL TAKE 1 TABLET BY MOUTH DAILY. PLEASE MAKE EVERDUE APPT WITH ALLRED BEFORE ANYMORE REFILLS   metFORMIN 1000 MG tablet Commonly known as: GLUCOPHAGE TAKE 1 TABLET BY MOUTH TWICE DAILY WITH MEALS   metoprolol succinate 25 MG 24 hr tablet Commonly known as: Toprol XL Take 0.5 tablets (12.5 mg total) by mouth daily.   multivitamin tablet Take 1 tablet by mouth daily.   OneTouch Delica Plus [id number] Misc USE LANCETS TO CHECK BLOOD SUGAR THREE TIMES DAILY   pantoprazole 40 MG tablet Commonly known as: PROTONIX Take 1 tablet (40 mg total) by mouth daily. Started by: [name], MD   sennosides-docusate sodium 8.6-50 MG tablet Commonly known as: SENOKOT-S Take 1 tablet by mouth daily.   Trulicity 3 SA/6.3KZ Sopn Generic drug: Dulaglutide INJECT 3 MG INTO THE SKIN ONCE A WEEK   Vascepa 1 g capsule Generic drug: icosapent Ethyl TAKE 2 CAPSULES BY MOUTH TWICE DAILY   VITAMIN C PO Take 2 tablets by mouth 2 (two) times daily.   Vitamin D3 50 MCG (2000 UT) capsule          Objective:   Physical Exam Ht 6\' 1"  (1.854 m)   Wt 226 lb (102.5 kg)   BMI 29.82 kg/m  This is a virtual video visit, he is alert oriented x3.  In no  apparent distress.  Minimal cough noted.    Assessment      Assessment  DM Dr. Dwyane Dee since 07-2014 Hyperlipidemia Hypothyroidism A flutter, ablation 12-2012, then  Paroxysmal Afib, s/p Maze-PFO closure-[place] 02-2016 GERD Depression Obesity Gout [name], CPAP since 2012; d/c after wt loss 2019  PLAN  Allergies: Restart Flonase which used to help significantly, prescription for Astelin sent, okay to use Claritin as needed.  Call if not better GERD: Used to be on PPIs,RX  for pantoprazole sent. Message with instructions will be sent. RTC is scheduled for November.    I discussed the assessment and treatment plan with the patient. The patient was provided an opportunity to ask questions and all were answered. The patient agreed with the plan and  demonstrated an understanding of the instructions.   The patient was advised to call back or seek an in-person evaluation if the symptoms worsen or if the condition fails to improve as anticipated.

## 2020-02-14 NOTE — Progress Notes (Signed)
Pre visit review using our clinic review tool, if applicable. No additional management support is needed unless otherwise documented below in the visit note. 

## 2020-02-15 NOTE — Assessment & Plan Note (Signed)
Allergies: Restart Flonase which used to help significantly, prescription for Astelin sent, okay to use Claritin as needed.  Call if not better GERD: Used to be on PPIs,RX  for pantoprazole sent. Message with instructions will be sent. RTC is scheduled for November.

## 2020-02-22 ENCOUNTER — Other Ambulatory Visit: Payer: Self-pay | Admitting: Endocrinology

## 2020-02-22 MED ORDER — LORAZEPAM 1 MG PO TABS: 1.0000 mg | ORAL_TABLET | Freq: Once | ORAL | 0 refills | Status: AC

## 2020-03-03 NOTE — Addendum Note (Signed)
Addended by: Darrell Jewel on: 03/03/2020 08:13 AM   Modules accepted: Orders

## 2020-03-10 ENCOUNTER — Telehealth (HOSPITAL_COMMUNITY): Payer: Self-pay | Admitting: Emergency Medicine

## 2020-03-10 NOTE — Telephone Encounter (Signed)
Reaching out to patient to offer assistance regarding upcoming cardiac imaging study; pt verbalizes understanding of appt date/time, parking situation and where to check in, pre-test NPO status and medications ordered, and verified current allergies; name and call back number provided for further questions should they arise Colin Bond RN Navigator Cardiac Imaging [facility] [phone] office [phone] cell   Pt denies cough, denies implants other than MAZE clip in heart, has claustro which he will take 1mg  ativan 30 mins prior to test. Confirmed he has a ride to and from appt  Colin Wood

## 2020-03-11 ENCOUNTER — Other Ambulatory Visit: Payer: Self-pay

## 2020-03-11 ENCOUNTER — Ambulatory Visit (HOSPITAL_COMMUNITY)
Admission: RE | Admit: 2020-03-11 | Discharge: 2020-03-11 | Disposition: A | Discharge status: Home / Self Care | Payer: 59 | Source: Ambulatory Visit | Attending: Internal Medicine | Admitting: Internal Medicine

## 2020-03-11 DIAGNOSIS — R9430 Abnormal result of cardiovascular function study, unspecified: Secondary | ICD-10-CM | POA: Diagnosis not present

## 2020-03-11 DIAGNOSIS — I4891 Unspecified atrial fibrillation: Secondary | ICD-10-CM | POA: Diagnosis not present

## 2020-03-11 MED ORDER — LORAZEPAM 2 MG/ML IJ SOLN
INTRAMUSCULAR | Status: AC
  Filled 2020-03-11: qty 1

## 2020-03-11 MED ORDER — MIDAZOLAM HCL 2 MG/2ML IJ SOLN
INTRAMUSCULAR | Status: AC
  Administered 2020-03-11: 1 mg
  Filled 2020-03-11: qty 2

## 2020-03-11 MED ORDER — MIDAZOLAM HCL 2 MG/2ML IJ SOLN
1.0000 mg | Freq: Once | INTRAMUSCULAR | Status: DC
  Filled 2020-03-11: qty 1

## 2020-03-11 MED ORDER — GADOBUTROL 1 MMOL/ML IV SOLN
10.0000 mL | Freq: Once | INTRAVENOUS | Status: AC | PRN
  Administered 2020-03-11: 10 mL via INTRAVENOUS

## 2020-03-14 ENCOUNTER — Other Ambulatory Visit: Payer: Self-pay | Admitting: Endocrinology

## 2020-03-16 ENCOUNTER — Other Ambulatory Visit: Payer: Self-pay | Admitting: Endocrinology

## 2020-03-20 ENCOUNTER — Telehealth: Payer: Self-pay | Admitting: Internal Medicine

## 2020-03-20 NOTE — Telephone Encounter (Signed)
Patient is returning phone call. Please call back 

## 2020-03-20 NOTE — Addendum Note (Signed)
Addended by: Darrell Jewel on: 03/20/2020 04:58 PM   Modules accepted: Orders

## 2020-03-20 NOTE — Telephone Encounter (Signed)
Please see result note 

## 2020-03-20 NOTE — Telephone Encounter (Signed)
Patient returning Tina's call.

## 2020-03-21 ENCOUNTER — Other Ambulatory Visit: Payer: 59

## 2020-03-25 ENCOUNTER — Other Ambulatory Visit: Payer: 59

## 2020-03-25 ENCOUNTER — Other Ambulatory Visit: Payer: Self-pay

## 2020-03-25 ENCOUNTER — Other Ambulatory Visit: Payer: Self-pay | Admitting: Endocrinology

## 2020-03-25 DIAGNOSIS — E669 Obesity, unspecified: Secondary | ICD-10-CM

## 2020-03-25 DIAGNOSIS — E1169 Type 2 diabetes mellitus with other specified complication: Secondary | ICD-10-CM

## 2020-03-25 DIAGNOSIS — E063 Autoimmune thyroiditis: Secondary | ICD-10-CM

## 2020-03-25 DIAGNOSIS — E782 Mixed hyperlipidemia: Secondary | ICD-10-CM

## 2020-03-25 DIAGNOSIS — Z125 Encounter for screening for malignant neoplasm of prostate: Secondary | ICD-10-CM

## 2020-03-25 DIAGNOSIS — E559 Vitamin D deficiency, unspecified: Secondary | ICD-10-CM

## 2020-03-26 LAB — COMPREHENSIVE METABOLIC PANEL
ALT: 17 IU/L (ref 0–44)
AST: 15 IU/L (ref 0–40)
Albumin/Globulin Ratio: 2.4 — ABNORMAL HIGH (ref 1.2–2.2)
Albumin: 5 g/dL — ABNORMAL HIGH (ref 3.8–4.9)
Alkaline Phosphatase: 53 IU/L (ref 44–121)
BUN/Creatinine Ratio: 18 (ref 10–24)
BUN: 18 mg/dL (ref 8–27)
Bilirubin Total: 0.4 mg/dL (ref 0.0–1.2)
CO2: 23 mmol/L (ref 20–29)
Calcium: 10.1 mg/dL (ref 8.6–10.2)
Chloride: 101 mmol/L (ref 96–106)
Creatinine, Ser: 0.98 mg/dL (ref 0.76–1.27)
GFR calc Af Amer: 96 mL/min/{1.73_m2} (ref >59)
GFR calc non Af Amer: 83 mL/min/{1.73_m2} (ref >59)
Globulin, Total: 2.1 g/dL (ref 1.5–4.5)
Glucose: 126 mg/dL — ABNORMAL HIGH (ref 65–99)
Potassium: 4.3 mmol/L (ref 3.5–5.2)
Sodium: 143 mmol/L (ref 134–144)
Total Protein: 7.1 g/dL (ref 6.0–8.5)

## 2020-03-26 LAB — HEMOGLOBIN A1C
Est. average glucose Bld gHb Est-mCnc: 126 mg/dL
Hgb A1c MFr Bld: 6 % — ABNORMAL HIGH (ref 4.8–5.6)

## 2020-03-26 LAB — LIPID PANEL
Chol/HDL Ratio: 3.9 ratio (ref 0.0–5.0)
Cholesterol, Total: 193 mg/dL (ref 100–199)
HDL: 49 mg/dL (ref >39)
LDL Chol Calc (NIH): 116 mg/dL — ABNORMAL HIGH (ref 0–99)
Triglycerides: 158 mg/dL — ABNORMAL HIGH (ref 0–149)
VLDL Cholesterol Cal: 28 mg/dL (ref 5–40)

## 2020-03-26 LAB — VITAMIN D 25 HYDROXY (VIT D DEFICIENCY, FRACTURES): Vit D, 25-Hydroxy: 31.2 ng/mL (ref 30.0–100.0)

## 2020-03-26 LAB — TSH: TSH: 0.899 u[IU]/mL (ref 0.450–4.500)

## 2020-03-26 LAB — SPECIMEN STATUS REPORT

## 2020-03-26 LAB — PSA: Prostate Specific Ag, Serum: 1.8 ng/mL (ref 0.0–4.0)

## 2020-03-26 LAB — T4, FREE: Free T4: 1.49 ng/dL (ref 0.82–1.77)

## 2020-03-27 NOTE — Progress Notes (Signed)
Patient ID: Colin Wood, male   DOB: 1960/11/14, 59 y.o.   MRN: [id number]           Reason for Appointment: Follow-up for various problems  Referring physician: Larose Kells  History of Present Illness:          Diagnosis: Type 2 diabetes mellitus, date of diagnosis: ?  2011        Past history: He was not having any symptoms at the time of diagnosis and not clear what his initial blood sugars were He was started on metformin initially and had fairly good control Subsequently with higher sugars he was given Amaryl also in addition which has been continued.  A1c was 10.6% in 2012 Subsequently in 2014 his A1c was down to 6.5 but he had a regular follow-up subsequently Because of an A1c of 8.1 in 02/2014 he was given Tradjenta in addition to the above drugs but this was not effective  He was referred here because of a high A1c of 7.9 in 07/2014  Recent history:   Oral hypoglycemic drugs the patient is taking are: metformin 1 g twice a day, Trulicity 3.0 mg weekly, Jardiance 25 mg daily      A1c had come down progressively and now slightly higher at 6 compared to 5.8  Current management, blood sugar values and problems identified:  He says he has had more traveling and eating out causing him to be gaining weight  Not exercising  Blood sugar monitoring has been very sporadic and he does not remember the readings  May be having readings about 130 fasting and highest reading 250 after eating dessert  Lab glucose was 126  He missed 1 dose of Trulicity when he was traveling otherwise has been regular         Side effects from medications have been:None  Compliance with the medical regimen: Fair   Glucose monitoring:  done less than once a day       Glucometer: Contour now .      Blood Glucose readings as above   PREVIOUS AVERAGE 112, morning range 101-127, evening 97    Self-care: Meal planning is fair, sometimes getting biscuits in the morning  Will snack on apples and  yogurt           Dietician visit, most recent: none, previously had gone to a class          Weight history: Previous range 260-280  Wt Readings from Last 3 Encounters:  03/28/20 233 lb 3.2 oz (105.8 kg)  02/14/20 226 lb (102.5 kg)  11/28/19 225 lb 12.8 oz (102.4 kg)    Glycemic control:   Lab Results  Component Value Date   HGBA1C 6.0 (H) 03/25/2020   HGBA1C 5.8 (H) 11/14/2019   HGBA1C 6.1 (H) 07/11/2019   Lab Results  Component Value Date   LDLCALC 116 (H) 03/25/2020   CREATININE 0.98 03/25/2020     Allergies as of 03/28/2020   No Known Allergies     Medication List       Accurate as of March 28, 2020  8:26 AM. If you have any questions, ask your nurse or doctor.        allopurinol 300 MG tablet Commonly known as: ZYLOPRIM Take 1 tablet (300 mg total) by mouth daily.   atorvastatin 10 MG tablet Commonly known as: LIPITOR TAKE 1 TABLET(10 MG) BY MOUTH DAILY   azelastine 0.1 % nasal spray Commonly known as: ASTELIN Place 2 sprays into both  nostrils at bedtime as needed for rhinitis.   famotidine 20 MG tablet Commonly known as: PEPCID Take 1 tablet (20 mg total) by mouth at bedtime.   fenofibrate 160 MG tablet Take 1 tablet (160 mg total) by mouth daily.   glucose blood test strip Commonly known as: OneTouch Verio Use as instructed to check blood sugar 3 times daily.   Jardiance 25 MG Tabs tablet Generic drug: empagliflozin TAKE 1 TABLET BY MOUTH DAILY   levothyroxine 137 MCG tablet Commonly known as: SYNTHROID TAKE 1 TABLET(137 MCG) BY MOUTH DAILY BEFORE BREAKFAST   lisinopril 5 MG tablet Commonly known as: ZESTRIL TAKE 1 TABLET BY MOUTH DAILY. PLEASE MAKE EVERDUE APPT WITH ALLRED BEFORE ANYMORE REFILLS   metFORMIN 1000 MG tablet Commonly known as: GLUCOPHAGE TAKE 1 TABLET BY MOUTH TWICE DAILY WITH MEALS   multivitamin tablet Take 1 tablet by mouth daily.   OneTouch Delica Plus [id number] Misc USE LANCETS TO CHECK BLOOD SUGAR THREE  TIMES DAILY   pantoprazole 40 MG tablet Commonly known as: PROTONIX Take 1 tablet (40 mg total) by mouth daily.   sennosides-docusate sodium 8.6-50 MG tablet Commonly known as: SENOKOT-S Take 1 tablet by mouth daily.   Trulicity 3 DE/0.8XK Sopn Generic drug: Dulaglutide INJECT 3 MG INTO THE SKIN ONCE A WEEK   Vascepa 1 g capsule Generic drug: icosapent Ethyl TAKE 2 CAPSULES BY MOUTH TWICE DAILY   VITAMIN C PO Take 2 tablets by mouth 2 (two) times daily.   Vitamin D3 50 MCG (2000 UT) capsule       Allergies:  No Known Allergies  Past Medical History:  Diagnosis Date  . Anxiety   . Atrial flutter ([place])    typical appearing; s/p ablation 12-2012  . Constipation   . Depression    used to see psych, on no meds as of 03/2009  . Diabetes mellitus    TYPE 2  . Fatty liver   . GERD (gastroesophageal reflux disease)   . Gout   . Hyperlipidemia   . Hypothyroidism   . Leg edema   . Obesity   . Persistent atrial fibrillation ([place])    PVI 12/2012  . Prediabetes   . Rhinitis    vasomotor  . S/P Minimally invasive maze operation for atrial fibrillation 03/12/2016   Complete bilateral atrial lesion set using cryothermy and bipolar radiofrequency ablation with clipping of LA appendage via right mini thoracotomy approach  . S/P patent foramen ovale closure 03/12/2016  . Sleep apnea    mild, now treated with CPAP since ~9-12    Past Surgical History:  Procedure Laterality Date  . ABLATION OF DYSRHYTHMIC FOCUS  01/05/2013   PVI and flutter ablation by [name]  . ATRIAL FIBRILLATION ABLATION N/A 01/05/2013   Procedure: ATRIAL FIBRILLATION ABLATION;  Surgeon: Lao Grayer, MD;  Location: [facility] CATH LAB;  Service: Cardiovascular;  Laterality: N/A;  . CARDIAC CATHETERIZATION N/A 01/12/2016   Procedure: Right/Left Heart Cath and Coronary Angiography;  Surgeon: [name], MD;  Location: [place] CV LAB;  Service: Cardiovascular;  Laterality: N/A;  . CARDIOVERSION  03/01/11   . CLIPPING OF ATRIAL APPENDAGE Left 03/12/2016   Procedure: CLIPPING OF ATRIAL APPENDAGE;  Surgeon: [name], MD;  Location: [place];  Service: Open Heart Surgery;  Laterality: Left;  . MINIMALLY INVASIVE MAZE PROCEDURE N/A 03/12/2016   Procedure: MINIMALLY INVASIVE MAZE PROCEDURE;  Surgeon: [name], MD;  Location: [place];  Service: Open Heart Surgery;  Laterality: N/A;  .  NASAL SINUS SURGERY    . TEE WITHOUT CARDIOVERSION N/A 01/04/2013   Procedure: TRANSESOPHAGEAL ECHOCARDIOGRAM (TEE);  Surgeon: [name], MD;  Location[facility] ENDOSCOPY;  Service: Cardiovascular;  Laterality: N/A;  . TEE WITHOUT CARDIOVERSION N/A 03/12/2016   Procedure: TRANSESOPHAGEAL ECHOCARDIOGRAM (TEE);  Surgeon: [name], MD;  Location: [place];  Service: Open Heart Surgery;  Laterality: N/A;  . TONSILLECTOMY    . VASECTOMY      Family History  Problem Relation Age of Onset  . Asthma Mother   . Breast cancer Mother   . Dementia Mother   . Diabetes Father   . Hyperlipidemia Father   . Obesity Father   . Atrial fibrillation Father   . Hypertension Brother   . Melanoma Brother   . Allergies Daughter   . Throat cancer Maternal Uncle   . Colon cancer Paternal Grandfather   . Prostate cancer Neg Hx     Social History:  reports that he has never smoked. He has never used smokeless tobacco. He reports that he does not drink alcohol and does not use drugs.    Review of Systems         Lipids:  has high triglycerides mostly previously as high as 681; LDL previously also above 100 at baseline.   On Fenofibrate and Vascepa for triglycerides, also taking Lipitor 10 mg daily  His LDL is well controlled and triglycerides are higher from poor diet lately       Lab Results  Component Value Date   CHOL 193 03/25/2020   HDL 49 03/25/2020   LDLCALC 116 (H) 03/25/2020   TRIG 158 (H) 03/25/2020   CHOLHDL 3.9 03/25/2020                   HYPOTHYROIDISM: He was diagnosed to have primary  hypothyroidism in 1994  He is consistent with his generic levothyroxine every morning before eating  Has been on levothyroxine 137 mcg daily now, previously TSH low TSH back to normal consistently but free T4 tends to be higher, likely from using his Centrum vitamin  Lab Results  Component Value Date   TSH 0.899 03/25/2020   TSH 1.580 11/14/2019   TSH 0.369 (L) 07/11/2019   FREET4 1.49 03/25/2020   FREET4 1.86 (H) 11/14/2019   FREET4 1.93 (H) 07/11/2019       The blood pressure has been treated by cardiologist with low-dose lisinopril and metoprolol   BP Readings from Last 3 Encounters:  03/28/20 128/82  11/28/19 110/68  11/21/19 128/72   Vitamin D deficiency: Has been prescribed vitamin D weekly with levels as low as 25, taking 2000 units vitamin D3 regularly  Lab Results  Component Value Date   VD25OH 31.2 03/25/2020   VD25OH 33.6 07/11/2019   VD25OH 25.1 (L) 03/21/2019   VD25OH 38.9 03/29/2018     HYPERCALCEMIA: The calcium continues to be slightly high or upper normal, also note that his albumin level is slightly high PTH level has been quite normal No history of kidney stones or known osteopenia  Lab Results  Component Value Date   CALCIUM 10.1 03/25/2020   CALCIUM 10.3 (H) 11/14/2019   CALCIUM 10.5 (H) 07/11/2019   CALCIUM 10.3 (H) 03/21/2019   Lab Results  Component Value Date   PTH 26 07/11/2019   CALCIUM 10.1 03/25/2020   CAION 1.15 03/13/2016    Previously was having decreased libido and motivation and testosterone level was normal in 2019  He has been prescribed  allopurinol by PCP  Physical Examination:  BP 128/82   Pulse 89   Ht 6\' 1"  (1.854 m)   Wt 233 lb 3.2 oz (105.8 kg)   SpO2 95%   BMI 30.77 kg/m      ASSESSMENT on 03/28/2020:  Diabetes type 2, with mild obesity  He is on a 3 drug regimen of metformin, Trulicity 3.0 mg and Jardiance 25 mg  See history of present illness for discussion of his current management, blood sugar  patterns and problems identified.    His A1c is slightly higher at 6 compared to 5.8  Again his main difficulty is keeping up with diet and exercise regimen and maintaining good lifestyle  He is recently gaining weight especially with eating out and not exercising  Fasting readings and may relatively higher compared to previous levels   HYPERPARATHYROIDISM: Asymptomatic, calcium stable at 10.1  HYPOTHYROIDISM: TSH now consistently normal  He is taking 137 mcg levothyroxine and this is fairly consistent  Lipids: Both LDL and triglycerides are high and this is from his eating out more including red meat, currently on triple therapy  Mild hypertension: Well controlled with minimal medications   PLAN:   He will request copies of his last exam from the optometrist to be sent here No change in any of his medications at this time He was given a booklet on fast food nutritional values and he will try to adjust his meals better with this Also he will schedule appointment to see the dietitian through his work He will start going to the [facility] to do his walking routine and at least every other day More blood sugars after meals and bring monitor for download on the next visit Continue observation alone for his mild hypercalcemia  We will defer foot exam to his PCP next month and is doing to do his annual exam  Follow-up in 4 months  Patient Instructions  Exercise 6/7 days  Check blood sugars on waking up 2-3 days a week  Also check blood sugars about 2 hours after meals and do this after different meals by rotation  Recommended blood sugar levels on waking up are 90-130 and about 2 hours after meal is 130-160  Please bring your blood sugar monitor to each visit, thank you  Double dose of Vitamin D      Elayne Snare 03/28/2020, 8:26 AM   Note: This office note was prepared with Dragon voice recognition system technology. Any transcriptional errors that result from this process  are unintentional.

## 2020-03-28 ENCOUNTER — Ambulatory Visit (INDEPENDENT_AMBULATORY_CARE_PROVIDER_SITE_OTHER): Payer: 59 | Admitting: Endocrinology

## 2020-03-28 ENCOUNTER — Other Ambulatory Visit: Payer: Self-pay | Admitting: *Deleted

## 2020-03-28 ENCOUNTER — Encounter: Payer: Self-pay | Admitting: Endocrinology

## 2020-03-28 ENCOUNTER — Other Ambulatory Visit: Payer: Self-pay

## 2020-03-28 ENCOUNTER — Other Ambulatory Visit: Payer: Self-pay | Admitting: Endocrinology

## 2020-03-28 VITALS — BP 128/82 | HR 89 | Ht 73.0 in | Wt 233.2 lb

## 2020-03-28 DIAGNOSIS — E669 Obesity, unspecified: Secondary | ICD-10-CM

## 2020-03-28 DIAGNOSIS — E063 Autoimmune thyroiditis: Secondary | ICD-10-CM

## 2020-03-28 DIAGNOSIS — E1169 Type 2 diabetes mellitus with other specified complication: Secondary | ICD-10-CM | POA: Diagnosis not present

## 2020-03-28 DIAGNOSIS — E559 Vitamin D deficiency, unspecified: Secondary | ICD-10-CM

## 2020-03-28 DIAGNOSIS — E782 Mixed hyperlipidemia: Secondary | ICD-10-CM

## 2020-03-28 MED ORDER — CONTOUR NEXT TEST VI STRP: ORAL_STRIP | 1 refills | Status: DC

## 2020-03-28 MED ORDER — MICROLET LANCETS MISC: 3 refills | Status: DC

## 2020-03-28 NOTE — Patient Instructions (Addendum)
Exercise 6/7 days  Check blood sugars on waking up 2-3 days a week  Also check blood sugars about 2 hours after meals and do this after different meals by rotation  Recommended blood sugar levels on waking up are 90-130 and about 2 hours after meal is 130-160  Please bring your blood sugar monitor to each visit, thank you  Double dose of Vitamin D

## 2020-04-08 ENCOUNTER — Other Ambulatory Visit: Payer: Self-pay | Admitting: Internal Medicine

## 2020-04-08 NOTE — Telephone Encounter (Signed)
Pt last saw [name] 11/28/19, last labs 10/26/21Creat 0.98, age 59, weight 105.8kg, CrCl 119.95, based on specified criteria pt is on appropriate dosage of Xarelto 20mg  QD.  Will refill rx.

## 2020-04-09 ENCOUNTER — Encounter: Payer: Self-pay | Admitting: Internal Medicine

## 2020-04-11 ENCOUNTER — Other Ambulatory Visit: Payer: Self-pay | Admitting: Endocrinology

## 2020-04-18 ENCOUNTER — Encounter: Payer: Self-pay | Admitting: Internal Medicine

## 2020-04-18 ENCOUNTER — Other Ambulatory Visit: Payer: Self-pay

## 2020-04-18 ENCOUNTER — Ambulatory Visit (INDEPENDENT_AMBULATORY_CARE_PROVIDER_SITE_OTHER): Payer: 59 | Admitting: Internal Medicine

## 2020-04-18 VITALS — BP 119/80 | HR 83 | Temp 97.8°F | Resp 16 | Ht 73.0 in | Wt 234.2 lb

## 2020-04-18 DIAGNOSIS — Z0000 Encounter for general adult medical examination without abnormal findings: Secondary | ICD-10-CM

## 2020-04-18 DIAGNOSIS — Z23 Encounter for immunization: Secondary | ICD-10-CM | POA: Diagnosis not present

## 2020-04-18 MED ORDER — COLCHICINE 0.6 MG PO CAPS: 1.0000 | ORAL_CAPSULE | Freq: Two times a day (BID) | ORAL | 0 refills | Status: DC | PRN

## 2020-04-18 MED ORDER — ALLOPURINOL 100 MG PO TABS: 200.0000 mg | ORAL_TABLET | Freq: Every day | ORAL | 1 refills | Status: DC

## 2020-04-18 MED ORDER — COLCHICINE 0.6 MG PO TABS: 0.6000 mg | ORAL_TABLET | Freq: Two times a day (BID) | ORAL | 0 refills | Status: DC | PRN

## 2020-04-18 NOTE — Progress Notes (Signed)
Subjective:    Patient ID: Colin Wood, male    DOB: Nov 12, 1960, 59 y.o.   MRN: [id number]  DOS:  04/18/2020 Type of visit - description: CPX  Feels well but he has been extremely busy. Had no time to exercise. When he is busy he tends to overeat and has gained weight. GERD symptoms: Mild, occasional gas or abdominal discomfort with overeating. Denies chest pain or palpitations   Wt Readings from Last 3 Encounters:  04/18/20 234 lb 4 oz (106.3 kg)  03/28/20 233 lb 3.2 oz (105.8 kg)  02/14/20 226 lb (102.5 kg)     Review of Systems  Other than above, a 14 point review of systems is negative      Past Medical History:  Diagnosis Date  . Anxiety   . Atrial flutter ([place])    typical appearing; s/p ablation 12-2012  . Constipation   . Depression    used to see psych, on no meds as of 03/2009  . Diabetes mellitus    TYPE 2  . Fatty liver   . GERD (gastroesophageal reflux disease)   . Gout   . Hyperlipidemia   . Hypothyroidism   . Leg edema   . Obesity   . Persistent atrial fibrillation ([place])    PVI 12/2012  . Prediabetes   . Rhinitis    vasomotor  . S/P Minimally invasive maze operation for atrial fibrillation 03/12/2016   Complete bilateral atrial lesion set using cryothermy and bipolar radiofrequency ablation with clipping of LA appendage via right mini thoracotomy approach  . S/P patent foramen ovale closure 03/12/2016  . Sleep apnea    mild, now treated with CPAP since ~9-12    Past Surgical History:  Procedure Laterality Date  . ABLATION OF DYSRHYTHMIC FOCUS  01/05/2013   PVI and flutter ablation by [name]  . ATRIAL FIBRILLATION ABLATION N/A 01/05/2013   Procedure: ATRIAL FIBRILLATION ABLATION;  Surgeon: Fridman Grayer, MD;  Location: [facility] CATH LAB;  Service: Cardiovascular;  Laterality: N/A;  . CARDIAC CATHETERIZATION N/A 01/12/2016   Procedure: Right/Left Heart Cath and Coronary Angiography;  Surgeon: [name], MD;  Location: [place] CV  LAB;  Service: Cardiovascular;  Laterality: N/A;  . CARDIOVERSION  03/01/11  . CLIPPING OF ATRIAL APPENDAGE Left 03/12/2016   Procedure: CLIPPING OF ATRIAL APPENDAGE;  Surgeon: [name], MD;  Location: [place];  Service: Open Heart Surgery;  Laterality: Left;  . MINIMALLY INVASIVE MAZE PROCEDURE N/A 03/12/2016   Procedure: MINIMALLY INVASIVE MAZE PROCEDURE;  Surgeon: [name], MD;  Location: [place];  Service: Open Heart Surgery;  Laterality: N/A;  . NASAL SINUS SURGERY    . TEE WITHOUT CARDIOVERSION N/A 01/04/2013   Procedure: TRANSESOPHAGEAL ECHOCARDIOGRAM (TEE);  Surgeon: [name], MD;  Location: [facility] ENDOSCOPY;  Service: Cardiovascular;  Laterality: N/A;  . TEE WITHOUT CARDIOVERSION N/A 03/12/2016   Procedure: TRANSESOPHAGEAL ECHOCARDIOGRAM (TEE);  Surgeon: [name], MD;  Location: [place];  Service: Open Heart Surgery;  Laterality: N/A;  . TONSILLECTOMY    . VASECTOMY      Allergies as of 04/18/2020   No Known Allergies     Medication List       Accurate as of April 18, 2020 11:59 PM. If you have any questions, ask your nurse or doctor.        allopurinol 100 MG tablet Commonly known as: ZYLOPRIM Take 2 tablets (200 mg total) by mouth daily. What changed:   medication strength  how much to take Changed by: [name], MD   atorvastatin 10 MG tablet Commonly known as: LIPITOR TAKE 1 TABLET(10 MG) BY MOUTH DAILY   azelastine 0.1 % nasal spray Commonly known as: ASTELIN Place 2 sprays into both nostrils at bedtime as needed for rhinitis.   colchicine 0.6 MG tablet Take 1 tablet (0.6 mg total) by mouth 2 (two) times daily as needed. Started by: [name], MD   Contour Next Test test strip Generic drug: glucose blood USE TO CHECK BLOOD SUGAR once a day at different times   famotidine 20 MG tablet Commonly known as: PEPCID Take 1 tablet (20 mg total) by mouth at bedtime.   fenofibrate 160 MG tablet Take 1 tablet (160 mg total) by mouth daily.    fluticasone 50 MCG/ACT nasal spray Commonly known as: FLONASE Place 1-2 sprays into both nostrils daily.   Jardiance 25 MG Tabs tablet Generic drug: empagliflozin TAKE 1 TABLET BY MOUTH DAILY   levothyroxine 137 MCG tablet Commonly known as: SYNTHROID TAKE 1 TABLET(137 MCG) BY MOUTH DAILY BEFORE BREAKFAST   lisinopril 5 MG tablet Commonly known as: ZESTRIL TAKE 1 TABLET BY MOUTH DAILY. PLEASE MAKE EVERDUE APPT WITH ALLRED BEFORE ANYMORE REFILLS   metFORMIN 1000 MG tablet Commonly known as: GLUCOPHAGE TAKE 1 TABLET BY MOUTH TWICE DAILY WITH MEALS   Microlet Lancets Misc Use to check blood sugar 3 times per day dx code E11.65   multivitamin tablet Take 1 tablet by mouth daily.   pantoprazole 40 MG tablet Commonly known as: PROTONIX Take 1 tablet (40 mg total) by mouth daily.   sennosides-docusate sodium 8.6-50 MG tablet Commonly known as: SENOKOT-S Take 1 tablet by mouth daily.   SM Vitamin D3 100 MCG (4000 UT) Caps Generic drug: Cholecalciferol Take by mouth. What changed: Another medication with the same name was removed. Continue taking this medication, and follow the directions you see here. Changed by: [name], MD   Trulicity 3 OF/7.5ZW Sopn Generic drug: Dulaglutide INJECT 3 MG INTO THE SKIN ONCE A WEEK   Vascepa 1 g capsule Generic drug: icosapent Ethyl TAKE 2 CAPSULES BY MOUTH TWICE DAILY   VITAMIN C PO Take 2 tablets by mouth 2 (two) times daily.   Xarelto 20 MG Tabs tablet Generic drug: rivaroxaban TAKE 1 TABLET(20 MG) BY MOUTH DAILY WITH SUPPER          Objective:   Physical Exam BP 119/80 (BP Location: Left Arm, Patient Position: Sitting, Cuff Size: Normal)   Pulse 83   Temp 97.8 F (36.6 C) (Oral)   Resp 16   Ht 6\' 1"  (1.854 m)   Wt 234 lb 4 oz (106.3 kg)   SpO2 97%   BMI 30.91 kg/m  General: Well developed, NAD, BMI noted Neck: No  thyromegaly  HEENT:  Normocephalic . Face symmetric, atraumatic Lungs:  CTA B Normal  respiratory effort, no intercostal retractions, no accessory muscle use. Heart: RRR,  no murmur.  Abdomen:  Not distended, soft, non-tender. No rebound or rigidity.   Lower extremities: no pretibial edema bilaterally  Skin: Exposed areas without rash. Not pale. Not jaundice Neurologic:  alert & oriented X3.  Speech normal, gait appropriate for age and unassisted Strength symmetric and appropriate for age.  Psych: Cognition and judgment appear intact.  Cooperative with normal attention span and concentration.  Behavior appropriate. No anxious or depressed appearing.     Assessment    Assessment  DM Dr. Dwyane Dee since 07-2014 Hyperlipidemia Hypothyroidism Hyperparathyroidism A  flutter, ablation 12-2012, then  Paroxysmal Afib, s/p Maze-PFO closure-[place] 02-2016 GERD Depression Obesity Gout [name], CPAP since 2012; d/c after wt loss 2019  PLAN  DM, hypothyroidism, hyperparathyroidism: Per Endo A flutter, history of: Xarelto DC by cardiology.  Asymptomatic. Obesity: BMI 30.9, gaining weight, counseled. Gout: No recent problems, decrease allopurinol to 200.  Colchicine for few days to prevent attack, Lipitor while on colchicine GERD: At the last visit, PPIs restarted.  Still has breakthrough symptoms mostly from overeating.  Will adjust diet, if better will decrease dose of PPIs at the next opportunity RTC 6 months.  This visit occurred during the SARS-CoV-2 public health emergency.  Safety protocols were in place, including screening questions prior to the visit, additional usage of staff PPE, and extensive cleaning of exam room while observing appropriate contact time as indicated for disinfecting solutions.

## 2020-04-18 NOTE — Patient Instructions (Addendum)
Happy Holidays!  Change allopurinol to 100 mg tablets: 2 tablets daily The first week after you change dose a gout attack is possible thus take colchicine twice a day for 1 week. Hold Lipitor for 1 week   GO TO THE FRONT DESK, PLEASE SCHEDULE YOUR APPOINTMENTS Come back for a checkup in 4 months.  Fasting

## 2020-04-18 NOTE — Progress Notes (Signed)
Pre visit review using our clinic review tool, if applicable. No additional management support is needed unless otherwise documented below in the visit note. 

## 2020-04-19 ENCOUNTER — Encounter: Payer: Self-pay | Admitting: Internal Medicine

## 2020-04-19 NOTE — Assessment & Plan Note (Signed)
DM, hypothyroidism, hyperparathyroidism: Per Endo A flutter, history of: Xarelto DC by cardiology.  Asymptomatic. Obesity: BMI 30.9, gaining weight, counseled. Gout: No recent problems, decrease allopurinol to 200.  Colchicine for few days to prevent attack, Lipitor while on colchicine GERD: At the last visit, PPIs restarted.  Still has breakthrough symptoms mostly from overeating.  Will adjust diet, if better will decrease dose of PPIs at the next opportunity RTC 6 months.

## 2020-04-19 NOTE — Assessment & Plan Note (Signed)
-   Td  2018 -pnm 23:  2015; prevnar 2016 -shingrex discussed  -covid vax x 2 , rec to proceed w/  booster - flu shot today -CCS: Cscope 08-2005: int. hemorrhoids, Cscope 05/2018, next 3 years per GI letter  -Prostate cancer screening: DRE 2020 wnl , PSA last montn wnl. -Labs reviewed: Last LDL not at goal, recommend diet, exercise and reassess in 4 months. -Diet and exercise: Recommend to go back to healthier diet with portion control.  Try to exercise 3 hours a week.

## 2020-04-25 ENCOUNTER — Other Ambulatory Visit: Payer: Self-pay | Admitting: Endocrinology

## 2020-04-28 ENCOUNTER — Other Ambulatory Visit: Payer: Self-pay | Admitting: Internal Medicine

## 2020-05-14 ENCOUNTER — Other Ambulatory Visit: Payer: Self-pay | Admitting: Endocrinology

## 2020-05-14 DIAGNOSIS — E669 Obesity, unspecified: Secondary | ICD-10-CM

## 2020-05-19 ENCOUNTER — Encounter: Payer: Self-pay | Admitting: Internal Medicine

## 2020-05-19 ENCOUNTER — Ambulatory Visit (INDEPENDENT_AMBULATORY_CARE_PROVIDER_SITE_OTHER): Payer: 59 | Admitting: Internal Medicine

## 2020-05-19 ENCOUNTER — Other Ambulatory Visit: Payer: Self-pay

## 2020-05-19 VITALS — BP 118/68 | HR 75 | Ht 73.0 in | Wt 234.0 lb

## 2020-05-19 DIAGNOSIS — I4819 Other persistent atrial fibrillation: Secondary | ICD-10-CM | POA: Diagnosis not present

## 2020-05-19 DIAGNOSIS — G4733 Obstructive sleep apnea (adult) (pediatric): Secondary | ICD-10-CM | POA: Diagnosis not present

## 2020-05-19 DIAGNOSIS — D6869 Other thrombophilia: Secondary | ICD-10-CM

## 2020-05-19 NOTE — Patient Instructions (Addendum)
Medication Instructions:  Your physician recommends that you continue on your current medications as directed. Please refer to the Current Medication list given to you today.  *If you need a refill on your cardiac medications before your next appointment, please call your pharmacy*  Lab Work: None ordered.  If you have labs (blood work) drawn today and your tests are completely normal, you will receive your results only by: [name] MyChart Message (if you have MyChart) OR . A paper copy in the mail If you have any lab test that is abnormal or we need to change your treatment, we will call you to review the results.  Testing/Procedures: None ordered.  Follow-Up: At [facility], you and your health needs are our priority.  As part of our continuing mission to provide you with exceptional heart care, we have created designated Provider Care Teams.  These Care Teams include your primary Cardiologist (physician) and Advanced Practice Providers (APPs -  Physician Assistants and Nurse Practitioners) who all work together to provide you with the care you need, when you need it.  We recommend signing up for the patient portal called "MyChart".  Sign up information is provided on this After Visit Summary.  MyChart is used to connect with patients for Virtual Visits (Telemedicine).  Patients are able to view lab/test results, encounter notes, upcoming appointments, etc.  Non-urgent messages can be sent to your provider as well.   To learn more about what you can do with MyChart, go to [email or web address].    Your next appointment:   Your physician wants you to follow-up in: 6 months with the Afib clinic. They will contact you.    05/18/2021 at 9:30am with [name].    Other Instructions:

## 2020-05-19 NOTE — Progress Notes (Signed)
PCP: Colon Branch, MD   Primary EP: [name]  Colin Wood is a 59 y.o. male who presents today for routine electrophysiology followup.  Since last being seen in our clinic, the patient reports doing very well.  Today, he denies symptoms of palpitations, chest pain, shortness of breath,  lower extremity edema, dizziness, presyncope, or syncope.  The patient is otherwise without complaint today.   Past Medical History:  Diagnosis Date   Anxiety    Atrial flutter ([place])    typical appearing; s/p ablation 12-2012   Constipation    Depression    used to see psych, on no meds as of 03/2009   Diabetes mellitus    TYPE 2   Fatty liver    GERD (gastroesophageal reflux disease)    Gout    Hyperlipidemia    Hypothyroidism    Leg edema    Obesity    Persistent atrial fibrillation ([facility])    PVI 12/2012   Prediabetes    Rhinitis    vasomotor   S/P Minimally invasive maze operation for atrial fibrillation 03/12/2016   Complete bilateral atrial lesion set using cryothermy and bipolar radiofrequency ablation with clipping of LA appendage via right mini thoracotomy approach   S/P patent foramen ovale closure 03/12/2016   Sleep apnea    mild, now treated with CPAP since ~9-12   Past Surgical History:  Procedure Laterality Date   ABLATION OF DYSRHYTHMIC FOCUS  01/05/2013   PVI and flutter ablation by [name]   ATRIAL FIBRILLATION ABLATION N/A 01/05/2013   Procedure: ATRIAL FIBRILLATION ABLATION;  Surgeon: Hege Grayer, MD;  Location: [facility] CATH LAB;  Service: Cardiovascular;  Laterality: N/A;   CARDIAC CATHETERIZATION N/A 01/12/2016   Procedure: Right/Left Heart Cath and Coronary Angiography;  Surgeon: [name], MD;  Location: [place] CV LAB;  Service: Cardiovascular;  Laterality: N/A;   CARDIOVERSION  03/01/11   CLIPPING OF ATRIAL APPENDAGE Left 03/12/2016   Procedure: CLIPPING OF ATRIAL APPENDAGE;  Surgeon: [name], MD;  Location: [place];  Service:  Open Heart Surgery;  Laterality: Left;   MINIMALLY INVASIVE MAZE PROCEDURE N/A 03/12/2016   Procedure: MINIMALLY INVASIVE MAZE PROCEDURE;  Surgeon: [name], MD;  Location: [place];  Service: Open Heart Surgery;  Laterality: N/A;   NASAL SINUS SURGERY     TEE WITHOUT CARDIOVERSION N/A 01/04/2013   Procedure: TRANSESOPHAGEAL ECHOCARDIOGRAM (TEE);  Surgeon: [name], MD;  Location: [facility] ENDOSCOPY;  Service: Cardiovascular;  Laterality: N/A;   TEE WITHOUT CARDIOVERSION N/A 03/12/2016   Procedure: TRANSESOPHAGEAL ECHOCARDIOGRAM (TEE);  Surgeon: [name], MD;  Location: [place];  Service: Open Heart Surgery;  Laterality: N/A;   TONSILLECTOMY     VASECTOMY      ROS- all systems are reviewed and negatives except as per HPI above  Current Outpatient Medications  Medication Sig Dispense Refill   allopurinol (ZYLOPRIM) 100 MG tablet Take 2 tablets (200 mg total) by mouth daily. 180 tablet 1   Ascorbic Acid (VITAMIN C PO) Take 2 tablets by mouth 2 (two) times daily.     atorvastatin (LIPITOR) 10 MG tablet TAKE 1 TABLET(10 MG) BY MOUTH DAILY 90 tablet 0   azelastine (ASTELIN) 0.1 % nasal spray Place 2 sprays into both nostrils at bedtime as needed for rhinitis. 30 mL 3   Cholecalciferol (SM VITAMIN D3) 100 MCG (4000 UT) CAPS Take by mouth.     colchicine 0.6 MG tablet Take 1 tablet (0.6 mg total)  by mouth 2 (two) times daily as needed. 60 tablet 0   famotidine (PEPCID) 20 MG tablet Take 1 tablet (20 mg total) by mouth at bedtime. 90 tablet 3   fenofibrate 160 MG tablet Take 1 tablet (160 mg total) by mouth daily. 90 tablet 3   fluticasone (FLONASE) 50 MCG/ACT nasal spray Place 1-2 sprays into both nostrils daily.     glucose blood (CONTOUR NEXT TEST) test strip USE TO CHECK BLOOD SUGAR THREE TIMES DAILY AS DIRECTED 300 strip 0   JARDIANCE 25 MG TABS tablet TAKE 1 TABLET BY MOUTH DAILY 90 tablet 1   levothyroxine (SYNTHROID) 137 MCG tablet TAKE 1 TABLET(137 MCG) BY MOUTH  DAILY BEFORE BREAKFAST 90 tablet 1   lisinopril (ZESTRIL) 5 MG tablet TAKE 1 TABLET BY MOUTH DAILY. PLEASE MAKE EVERDUE APPT WITH [facility] Bensinger BEFORE ANYMORE REFILLS 90 tablet 3   metFORMIN (GLUCOPHAGE) 1000 MG tablet TAKE 1 TABLET BY MOUTH TWICE DAILY WITH MEALS 180 tablet 1   Microlet Lancets MISC Use to check blood sugar 3 times per day dx code E11.65 100 each 3   Multiple Vitamin (MULTIVITAMIN) tablet Take 1 tablet by mouth daily.     pantoprazole (PROTONIX) 40 MG tablet Take 1 tablet (40 mg total) by mouth daily. 30 tablet 3   sennosides-docusate sodium (SENOKOT-S) 8.6-50 MG tablet Take 1 tablet by mouth daily.     TRULICITY 3 IF/0.2DX SOPN INJECT 3 MG INTO THE SKIN ONCE A WEEK 6 mL 0   VASCEPA 1 g capsule TAKE 2 CAPSULES BY MOUTH TWICE DAILY 360 capsule 3   No current facility-administered medications for this visit.    Physical Exam: Vitals:   05/19/20 1615  BP: 118/68  Pulse: 75  SpO2: 97%  Weight: 234 lb (106.1 kg)  Height: 6\' 1"  (1.854 m)    GEN- The patient is well appearing, alert and oriented x 3 today.   Head- normocephalic, atraumatic Eyes-  Sclera clear, conjunctiva pink Ears- hearing intact Oropharynx- clear Lungs- Clear to ausculation bilaterally, normal work of breathing Heart- Regular rate and rhythm, no murmurs, rubs or gallops, PMI not laterally displaced GI- soft, NT, ND, + BS Extremities- no clubbing, cyanosis, or edema  Wt Readings from Last 3 Encounters:  05/19/20 234 lb (106.1 kg)  04/18/20 234 lb 4 oz (106.3 kg)  03/28/20 233 lb 3.2 oz (105.8 kg)    EKG tracing ordered today is personally reviewed and shows sinus rhythm with RBBB, LAD  Echo 12/20/19 reviewed  Assessment and Plan:  1. Persistent atrial fibrillation Doing well s/p MAZE and LAA clip Now off of xarelto (Chads2vasc score is 1,  He is s/p LAA clip)  2. [name] He is working on weight reduction strategies  3. Overweight Body mass index is 30.87 kg/m. Lifestyle modification is  advised  4. Tachycardia mediated CM Resolved with sinus  Risks, benefits and potential toxicities for medications prescribed and/or refilled reviewed with patient today.   Follow-up in 6 months in the AF clinic for lifestyle reinforcement  I will see in a year  Meroney Grayer MD, [facility] 05/19/2020 4:29 PM

## 2020-05-21 ENCOUNTER — Other Ambulatory Visit: Payer: Self-pay | Admitting: Endocrinology

## 2020-05-26 ENCOUNTER — Other Ambulatory Visit: Payer: Self-pay | Admitting: Endocrinology

## 2020-06-02 ENCOUNTER — Ambulatory Visit: Payer: 59 | Admitting: Internal Medicine

## 2020-06-08 ENCOUNTER — Other Ambulatory Visit: Payer: Self-pay | Admitting: Internal Medicine

## 2020-06-17 ENCOUNTER — Other Ambulatory Visit: Payer: Self-pay | Admitting: Endocrinology

## 2020-07-02 ENCOUNTER — Other Ambulatory Visit: Payer: Self-pay | Admitting: Internal Medicine

## 2020-07-06 ENCOUNTER — Other Ambulatory Visit: Payer: Self-pay | Admitting: Internal Medicine

## 2020-07-07 MED ORDER — ALLOPURINOL 100 MG PO TABS: 200.0000 mg | ORAL_TABLET | Freq: Every day | ORAL | 1 refills | Status: DC

## 2020-07-25 ENCOUNTER — Other Ambulatory Visit: Payer: Self-pay | Admitting: Endocrinology

## 2020-07-25 ENCOUNTER — Other Ambulatory Visit (INDEPENDENT_AMBULATORY_CARE_PROVIDER_SITE_OTHER): Payer: 59

## 2020-07-25 ENCOUNTER — Other Ambulatory Visit: Payer: 59

## 2020-07-25 ENCOUNTER — Other Ambulatory Visit: Payer: Self-pay

## 2020-07-25 DIAGNOSIS — E063 Autoimmune thyroiditis: Secondary | ICD-10-CM

## 2020-07-25 DIAGNOSIS — E669 Obesity, unspecified: Secondary | ICD-10-CM

## 2020-07-25 DIAGNOSIS — E1169 Type 2 diabetes mellitus with other specified complication: Secondary | ICD-10-CM

## 2020-07-25 DIAGNOSIS — E782 Mixed hyperlipidemia: Secondary | ICD-10-CM

## 2020-07-26 LAB — COMPREHENSIVE METABOLIC PANEL
ALT: 18 IU/L (ref 0–44)
AST: 18 IU/L (ref 0–40)
Albumin/Globulin Ratio: 3.2 — ABNORMAL HIGH (ref 1.2–2.2)
Albumin: 5.1 g/dL — ABNORMAL HIGH (ref 3.8–4.9)
Alkaline Phosphatase: 48 IU/L (ref 44–121)
BUN/Creatinine Ratio: 20 (ref 10–24)
BUN: 20 mg/dL (ref 8–27)
Bilirubin Total: 0.4 mg/dL (ref 0.0–1.2)
CO2: 24 mmol/L (ref 20–29)
Calcium: 10.4 mg/dL — ABNORMAL HIGH (ref 8.6–10.2)
Chloride: 99 mmol/L (ref 96–106)
Creatinine, Ser: 1.01 mg/dL (ref 0.76–1.27)
GFR calc Af Amer: 93 mL/min/{1.73_m2} (ref >59)
GFR calc non Af Amer: 80 mL/min/{1.73_m2} (ref >59)
Globulin, Total: 1.6 g/dL (ref 1.5–4.5)
Glucose: 111 mg/dL — ABNORMAL HIGH (ref 65–99)
Potassium: 5.3 mmol/L — ABNORMAL HIGH (ref 3.5–5.2)
Sodium: 140 mmol/L (ref 134–144)
Total Protein: 6.7 g/dL (ref 6.0–8.5)

## 2020-07-26 LAB — TSH: TSH: 1.17 u[IU]/mL (ref 0.450–4.500)

## 2020-07-26 LAB — HEMOGLOBIN A1C
Est. average glucose Bld gHb Est-mCnc: 131 mg/dL
Hgb A1c MFr Bld: 6.2 % — ABNORMAL HIGH (ref 4.8–5.6)

## 2020-07-26 LAB — MICROALBUMIN / CREATININE URINE RATIO
Creatinine, Urine: 73.8 mg/dL
Microalb/Creat Ratio: <4 mg/g creat (ref 0–29)
Microalbumin, Urine: <3 ug/mL

## 2020-07-26 LAB — LIPID PANEL
Chol/HDL Ratio: 3.4 ratio (ref 0.0–5.0)
Cholesterol, Total: 165 mg/dL (ref 100–199)
HDL: 49 mg/dL (ref >39)
LDL Chol Calc (NIH): 91 mg/dL (ref 0–99)
Triglycerides: 146 mg/dL (ref 0–149)
VLDL Cholesterol Cal: 25 mg/dL (ref 5–40)

## 2020-07-26 LAB — T4, FREE: Free T4: 1.77 ng/dL (ref 0.82–1.77)

## 2020-07-31 NOTE — Progress Notes (Signed)
Patient ID: Colin Wood, male   DOB: 1961-05-02, 60 y.o.   MRN: [id number]           Reason for Appointment: Follow-up for various problems  Referring physician: Larose Kells  History of Present Illness:          Diagnosis: Type 2 diabetes mellitus, date of diagnosis: ?  2011        Past history: He was not having any symptoms at the time of diagnosis and not clear what his initial blood sugars were He was started on metformin initially and had fairly good control Subsequently with higher sugars he was given Amaryl also in addition which has been continued.  A1c was 10.6% in 2012 Subsequently in 2014 his A1c was down to 6.5 but he had a regular follow-up subsequently Because of an A1c of 8.1 in 02/2014 he was given Tradjenta in addition to the above drugs but this was not effective  He was referred here because of a high A1c of 7.9 in 07/2014  Recent history:   Oral hypoglycemic drugs the patient is taking are: metformin 1 g twice a day, Trulicity 3.0 mg weekly, Jardiance 25 mg daily      A1c had come down progressively and now slightly higher at 6 compared to 5.8  Current management, blood sugar values and problems identified:  He has been able to get his weight down finally  This is likely to be from overall reducing portions and calories  However he still thinks that sometimes he will snack late at night causing higher readings in the morning  Still is not motivated to exercise and may be only doing this occasionally with walking, again he is blaming his busy schedule on lack of time  No nausea with 3 mg Trulicity which he takes regularly weekly  He feels that his blood sugars are better in the morning only if he does not snack late at night         Side effects from medications have been:None  Compliance with the medical regimen: Fair   Glucose monitoring:  done less than once a day       Glucometer: Contour  .      Blood Glucose readings and averages from  download:    PRE-MEAL Fasting Lunch Dinner Bedtime Overall  Glucose range:  113-205   108  144  100-209  Mean/median: 148       POST-MEAL PC Breakfast PC Lunch PC Dinner  Glucose range:     Mean/median:  203       Self-care: Meal planning is fair, sometimes getting biscuits in the morning          Dietician visit, most recent: none, previously had gone to a class          Weight history: Previous range 260-280  Wt Readings from Last 3 Encounters:  08/01/20 228 lb (103.4 kg)  05/19/20 234 lb (106.1 kg)  04/18/20 234 lb 4 oz (106.3 kg)    Glycemic control:   Lab Results  Component Value Date   HGBA1C 6.2 (H) 07/25/2020   HGBA1C 6.0 (H) 03/25/2020   HGBA1C 5.8 (H) 11/14/2019   Lab Results  Component Value Date   LDLCALC 91 07/25/2020   CREATININE 1.01 07/25/2020     Allergies as of 08/01/2020   No Known Allergies     Medication List       Accurate as of August 01, 2020  8:40 AM. If you have any  questions, ask your nurse or doctor.        STOP taking these medications   Xarelto 20 MG Tabs tablet Generic drug: rivaroxaban Stopped by: Elayne Snare, MD     TAKE these medications   allopurinol 100 MG tablet Commonly known as: ZYLOPRIM Take 2 tablets (200 mg total) by mouth daily.   atorvastatin 10 MG tablet Commonly known as: LIPITOR TAKE 1 TABLET(10 MG) BY MOUTH DAILY   azelastine 0.1 % nasal spray Commonly known as: ASTELIN Place 2 sprays into both nostrils at bedtime as needed for rhinitis.   colchicine 0.6 MG tablet Take 1 tablet (0.6 mg total) by mouth 2 (two) times daily as needed.   Contour Next Test test strip Generic drug: glucose blood USE TO CHECK BLOOD SUGAR THREE TIMES DAILY AS DIRECTED   famotidine 20 MG tablet Commonly known as: PEPCID Take 1 tablet (20 mg total) by mouth at bedtime.   fenofibrate 160 MG tablet Take 1 tablet (160 mg total) by mouth daily.   fluticasone 50 MCG/ACT nasal spray Commonly known as: FLONASE Place 1-2  sprays into both nostrils daily.   icosapent Ethyl 1 g capsule Commonly known as: VASCEPA TAKE 2 CAPSULES BY MOUTH TWICE DAILY   icosapent Ethyl 1 g capsule Commonly known as: VASCEPA TAKE 2 CAPSULES BY MOUTH TWICE DAILY   Vascepa 1 g capsule Generic drug: icosapent Ethyl TAKE 2 CAPSULES BY MOUTH TWICE DAILY   icosapent Ethyl 1 g capsule Commonly known as: VASCEPA TAKE 2 CAPSULES BY MOUTH TWICE DAILY   Jardiance 25 MG Tabs tablet Generic drug: empagliflozin TAKE 1 TABLET BY MOUTH DAILY   levothyroxine 137 MCG tablet Commonly known as: SYNTHROID TAKE 1 TABLET(137 MCG) BY MOUTH DAILY BEFORE BREAKFAST   lisinopril 5 MG tablet Commonly known as: ZESTRIL TAKE 1 TABLET BY MOUTH DAILY. PLEASE MAKE EVERDUE APPT WITH ALLRED BEFORE ANYMORE REFILLS   metFORMIN 1000 MG tablet Commonly known as: GLUCOPHAGE TAKE 1 TABLET BY MOUTH TWICE DAILY WITH MEALS   Microlet Lancets Misc USE TO CHECK BLOOD SUGAR THREE TIMES DAILY   multivitamin tablet Take 1 tablet by mouth daily.   pantoprazole 40 MG tablet Commonly known as: PROTONIX Take 1 tablet (40 mg total) by mouth daily.   sennosides-docusate sodium 8.6-50 MG tablet Commonly known as: SENOKOT-S Take 1 tablet by mouth daily.   SM Vitamin D3 100 MCG (4000 UT) Caps Generic drug: Cholecalciferol Take by mouth.   Trulicity 3 ZO/1.0RU Sopn Generic drug: Dulaglutide INJECT 3 MG INTO THE SKIN ONCE A WEEK   VITAMIN C PO Take 2 tablets by mouth 2 (two) times daily.   Zinc 50 MG Caps       Allergies:  No Known Allergies  Past Medical History:  Diagnosis Date  . Anxiety   . Atrial flutter ([place])    typical appearing; s/p ablation 12-2012  . Constipation   . Depression    used to see psych, on no meds as of 03/2009  . Diabetes mellitus    TYPE 2  . Fatty liver   . GERD (gastroesophageal reflux disease)   . Gout   . Hyperlipidemia   . Hypothyroidism   . Leg edema   . Obesity   . Persistent atrial fibrillation ([place])     PVI 12/2012  . Prediabetes   . Rhinitis    vasomotor  . S/P Minimally invasive maze operation for atrial fibrillation 03/12/2016   Complete bilateral atrial lesion set using cryothermy and bipolar radiofrequency ablation  with clipping of LA appendage via right mini thoracotomy approach  . S/P patent foramen ovale closure 03/12/2016  . Sleep apnea    mild, now treated with CPAP since ~9-12    Past Surgical History:  Procedure Laterality Date  . ABLATION OF DYSRHYTHMIC FOCUS  01/05/2013   PVI and flutter ablation by [name]  . ATRIAL FIBRILLATION ABLATION N/A 01/05/2013   Procedure: ATRIAL FIBRILLATION ABLATION;  Surgeon: Luger Grayer, MD;  Location: [facility] CATH LAB;  Service: Cardiovascular;  Laterality: N/A;  . CARDIAC CATHETERIZATION N/A 01/12/2016   Procedure: Right/Left Heart Cath and Coronary Angiography;  Surgeon: [name], MD;  Location: [place] CV LAB;  Service: Cardiovascular;  Laterality: N/A;  . CARDIOVERSION  03/01/11  . CLIPPING OF ATRIAL APPENDAGE Left 03/12/2016   Procedure: CLIPPING OF ATRIAL APPENDAGE;  Surgeon: [name], MD;  Location: [place];  Service: Open Heart Surgery;  Laterality: Left;  . MINIMALLY INVASIVE MAZE PROCEDURE N/A 03/12/2016   Procedure: MINIMALLY INVASIVE MAZE PROCEDURE;  Surgeon: [name], MD;  Location: [place];  Service: Open Heart Surgery;  Laterality: N/A;  . NASAL SINUS SURGERY    . TEE WITHOUT CARDIOVERSION N/A 01/04/2013   Procedure: TRANSESOPHAGEAL ECHOCARDIOGRAM (TEE);  Surgeon: [name], MD;  Location: [facility] ENDOSCOPY;  Service: Cardiovascular;  Laterality: N/A;  . TEE WITHOUT CARDIOVERSION N/A 03/12/2016   Procedure: TRANSESOPHAGEAL ECHOCARDIOGRAM (TEE);  Surgeon: [name], MD;  Location: [place];  Service: Open Heart Surgery;  Laterality: N/A;  . TONSILLECTOMY    . VASECTOMY      Family History  Problem Relation Age of Onset  . Asthma Mother   . Breast cancer Mother   . Dementia Mother   . Diabetes Father    . Hyperlipidemia Father   . Obesity Father   . Atrial fibrillation Father   . Hypertension Brother   . Melanoma Brother   . Allergies Daughter   . Throat cancer Maternal Uncle   . Colon cancer Paternal Grandfather   . Prostate cancer Neg Hx     Social History:  reports that he has never smoked. He has never used smokeless tobacco. He reports that he does not drink alcohol and does not use drugs.    Review of Systems         Lipids:  has high triglycerides mostly previously as high as 681; LDL previously also above 100 at baseline.   On Fenofibrate and Vascepa for triglycerides, also taking Lipitor 10 mg daily He has been on either Vascepa or Lovaza since about 2016 and has not had any issues with atrial fibrillation for the last few years  His LDL is better controlled and triglycerides are back down below 150 He has cut back on red meat and portions and has lost weight       Lab Results  Component Value Date   CHOL 165 07/25/2020   HDL 49 07/25/2020   LDLCALC 91 07/25/2020   TRIG 146 07/25/2020   CHOLHDL 3.4 07/25/2020                   HYPOTHYROIDISM: He was diagnosed to have primary hypothyroidism in 1994  He is consistent with his generic levothyroxine every morning before eating  Has been on levothyroxine 137 mcg daily TSH consistently normal  Lab Results  Component Value Date   TSH 1.170 07/25/2020   TSH 0.899 03/25/2020   TSH 1.580 11/14/2019   FREET4 1.77 07/25/2020   FREET4 1.49  03/25/2020   FREET4 1.86 (H) 11/14/2019       The blood pressure has been treated by cardiologist with low-dose lisinopril and metoprolol   BP Readings from Last 3 Encounters:  08/01/20 122/80  05/19/20 118/68  04/18/20 119/80   Appears to have mild hyperkalemia now, previously normal  Lab Results  Component Value Date   K 5.3 (H) 07/25/2020    Vitamin D deficiency: Has been prescribed vitamin D weekly with levels as low as 25, taking 2000 units vitamin D3  regularly  Lab Results  Component Value Date   VD25OH 31.2 03/25/2020   VD25OH 33.6 07/11/2019   VD25OH 25.1 (L) 03/21/2019   VD25OH 38.9 03/29/2018     HYPERCALCEMIA: The calcium continues to be slightly high or upper normal, also note that his albumin level is slightly high PTH level has been normal, last 26 No history of kidney stones or known osteopenia  Lab Results  Component Value Date   CALCIUM 10.4 (H) 07/25/2020   CALCIUM 10.1 03/25/2020   CALCIUM 10.3 (H) 11/14/2019   CALCIUM 10.5 (H) 07/11/2019   Lab Results  Component Value Date   PTH 26 07/11/2019   CALCIUM 10.4 (H) 07/25/2020   CAION 1.15 03/13/2016     He has been prescribed allopurinol by PCP  Physical Examination:  BP 122/80   Pulse 86   Ht 6\' 1"  (1.854 m)   Wt 228 lb (103.4 kg)   SpO2 99%   BMI 30.08 kg/m      ASSESSMENT on 03/28/2020:  Diabetes type 2, with mild obesity  He is on a 3 drug regimen of metformin, Trulicity 3.0 mg and Jardiance 25 mg  See history of present illness for discussion of his current management, blood sugar patterns and problems identified.    His A1c is slightly higher at 6.2  With a reading of less than overall improving his diet with calorie restriction his weight has come down  However fasting readings are mostly high from late night snacks  Again has not exercised  Renal function stable with Jardiance   HYPERPARATHYROIDISM: Asymptomatic, calcium usually upper normal or slightly high  HYPOTHYROIDISM: TSH now consistently normal with taking 137 mcg levothyroxine which she takes before breakfast daily   Lipids: Both LDL and triglycerides are improved for him better diet and eating out less   Mild hypertension: Currently only on 5 mg lisinopril However this may be causing tendency to hyperkalemia, now 5.3   PLAN:    Trial of 4.5 mg Trulicity which should help him comply with diet better and continue with weight loss  Regular exercise, he can  either walk outside or use a treadmill at work  More consistent monitoring of blood sugars after meals  He needs to keep portions of carbohydrates small  Recheck potassium today and consider stopping lisinopril if still high  Continue same lipid-lowering medication but will consult his cardiologist regarding use of Vascepa; unlikely that this should cause recurrence of his atrial fibrillation since he has been on patient oral preparations for 6 years now without a problem  Follow-up in 4 months again    Patient Instructions  Check blood sugars on waking up 3 days a week  Also check blood sugars about 2 hours after meals and do this after different meals by rotation  Recommended blood sugar levels on waking up are 90-130 and about 2 hours after meal is 130-160  Please bring your blood sugar monitor to each visit, thank you  Trulicity will be 4.5mg       Elayne Snare 08/01/2020, 8:40 AM   Note: This office note was prepared with Dragon voice recognition system technology. Any transcriptional errors that result from this process are unintentional.

## 2020-08-01 ENCOUNTER — Encounter: Payer: Self-pay | Admitting: Endocrinology

## 2020-08-01 ENCOUNTER — Other Ambulatory Visit: Payer: Self-pay

## 2020-08-01 ENCOUNTER — Ambulatory Visit: Payer: 59 | Admitting: Endocrinology

## 2020-08-01 VITALS — BP 122/80 | HR 86 | Ht 73.0 in | Wt 228.0 lb

## 2020-08-01 DIAGNOSIS — E063 Autoimmune thyroiditis: Secondary | ICD-10-CM | POA: Diagnosis not present

## 2020-08-01 DIAGNOSIS — E875 Hyperkalemia: Secondary | ICD-10-CM | POA: Diagnosis not present

## 2020-08-01 DIAGNOSIS — E669 Obesity, unspecified: Secondary | ICD-10-CM | POA: Diagnosis not present

## 2020-08-01 DIAGNOSIS — E1169 Type 2 diabetes mellitus with other specified complication: Secondary | ICD-10-CM | POA: Diagnosis not present

## 2020-08-01 DIAGNOSIS — E782 Mixed hyperlipidemia: Secondary | ICD-10-CM

## 2020-08-01 NOTE — Patient Instructions (Addendum)
Check blood sugars on waking up 3 days a week  Also check blood sugars about 2 hours after meals and do this after different meals by rotation  Recommended blood sugar levels on waking up are 90-130 and about 2 hours after meal is 130-160  Please bring your blood sugar monitor to each visit, thank you  Trulicity will be 4.5mg 

## 2020-08-02 LAB — POTASSIUM: Potassium: 5.1 mmol/L (ref 3.5–5.2)

## 2020-08-10 ENCOUNTER — Other Ambulatory Visit: Payer: Self-pay | Admitting: Endocrinology

## 2020-08-11 ENCOUNTER — Other Ambulatory Visit: Payer: Self-pay | Admitting: Endocrinology

## 2020-08-14 ENCOUNTER — Other Ambulatory Visit: Payer: Self-pay

## 2020-08-15 ENCOUNTER — Ambulatory Visit: Payer: 59 | Admitting: Internal Medicine

## 2020-08-15 ENCOUNTER — Other Ambulatory Visit: Payer: Self-pay

## 2020-08-15 ENCOUNTER — Encounter: Payer: Self-pay | Admitting: Internal Medicine

## 2020-08-15 VITALS — BP 113/74 | HR 78 | Temp 98.1°F | Ht 73.0 in | Wt 228.0 lb

## 2020-08-15 DIAGNOSIS — E785 Hyperlipidemia, unspecified: Secondary | ICD-10-CM

## 2020-08-15 DIAGNOSIS — I4891 Unspecified atrial fibrillation: Secondary | ICD-10-CM | POA: Diagnosis not present

## 2020-08-15 NOTE — Progress Notes (Signed)
Subjective:    Patient ID: Colin Wood, male    DOB: Mar 11, 1961, 60 y.o.   MRN: [id number]  DOS:  08/15/2020 Type of visit - description: Routine checkup Since the last office visit was seen by Endo and cardiology, notes reviewed. Feeling well. Weight loss noted.   BP Readings from Last 3 Encounters:  08/15/20 113/74  08/01/20 122/80  05/19/20 118/68   Wt Readings from Last 3 Encounters:  08/15/20 228 lb (103.4 kg)  08/01/20 228 lb (103.4 kg)  05/19/20 234 lb (106.1 kg)     Review of Systems Denies fatigue or snoring No chest pain or difficulty breathing. No palpitations  Past Medical History:  Diagnosis Date  . Anxiety   . Atrial flutter ([place])    typical appearing; s/p ablation 12-2012  . Constipation   . Diabetes mellitus    TYPE 2  . Fatty liver   . GERD (gastroesophageal reflux disease)   . Gout   . Hyperlipidemia   . Hypothyroidism   . Leg edema   . Obesity   . Persistent atrial fibrillation ([place])    PVI 12/2012  . Prediabetes   . Rhinitis    vasomotor  . S/P Minimally invasive maze operation for atrial fibrillation 03/12/2016   Complete bilateral atrial lesion set using cryothermy and bipolar radiofrequency ablation with clipping of LA appendage via right mini thoracotomy approach  . S/P patent foramen ovale closure 03/12/2016  . Sleep apnea    mild, now treated with CPAP since ~9-12    Past Surgical History:  Procedure Laterality Date  . ABLATION OF DYSRHYTHMIC FOCUS  01/05/2013   PVI and flutter ablation by [name]  . ATRIAL FIBRILLATION ABLATION N/A 01/05/2013   Procedure: ATRIAL FIBRILLATION ABLATION;  Surgeon: Kirkes Grayer, MD;  Location: [facility] CATH LAB;  Service: Cardiovascular;  Laterality: N/A;  . CARDIAC CATHETERIZATION N/A 01/12/2016   Procedure: Right/Left Heart Cath and Coronary Angiography;  Surgeon: [name], MD;  Location: [place] CV LAB;  Service: Cardiovascular;  Laterality: N/A;  . CARDIOVERSION  03/01/11  . CLIPPING  OF ATRIAL APPENDAGE Left 03/12/2016   Procedure: CLIPPING OF ATRIAL APPENDAGE;  Surgeon: [name], MD;  Location: [place];  Service: Open Heart Surgery;  Laterality: Left;  . MINIMALLY INVASIVE MAZE PROCEDURE N/A 03/12/2016   Procedure: MINIMALLY INVASIVE MAZE PROCEDURE;  Surgeon: [name], MD;  Location: [place];  Service: Open Heart Surgery;  Laterality: N/A;  . NASAL SINUS SURGERY    . TEE WITHOUT CARDIOVERSION N/A 01/04/2013   Procedure: TRANSESOPHAGEAL ECHOCARDIOGRAM (TEE);  Surgeon: [name], MD;  Location: [facility] ENDOSCOPY;  Service: Cardiovascular;  Laterality: N/A;  . TEE WITHOUT CARDIOVERSION N/A 03/12/2016   Procedure: TRANSESOPHAGEAL ECHOCARDIOGRAM (TEE);  Surgeon: [name], MD;  Location: [place];  Service: Open Heart Surgery;  Laterality: N/A;  . TONSILLECTOMY    . VASECTOMY      Allergies as of 08/15/2020   No Known Allergies     Medication List       Accurate as of August 15, 2020 11:59 PM. If you have any questions, ask your nurse or doctor.        allopurinol 100 MG tablet Commonly known as: ZYLOPRIM Take 2 tablets (200 mg total) by mouth daily.   atorvastatin 10 MG tablet Commonly known as: LIPITOR TAKE 1 TABLET(10 MG) BY MOUTH DAILY   azelastine 0.1 % nasal spray Commonly known as: ASTELIN Place 2 sprays into both nostrils at bedtime  as needed for rhinitis.   colchicine 0.6 MG tablet Take 1 tablet (0.6 mg total) by mouth 2 (two) times daily as needed.   Contour Next Test test strip Generic drug: glucose blood USE TO CHECK BLOOD SUGAR THREE TIMES DAILY AS DIRECTED   famotidine 20 MG tablet Commonly known as: PEPCID Take 1 tablet (20 mg total) by mouth at bedtime.   fenofibrate 160 MG tablet Take 1 tablet (160 mg total) by mouth daily.   fluticasone 50 MCG/ACT nasal spray Commonly known as: FLONASE Place 1-2 sprays into both nostrils daily.   icosapent Ethyl 1 g capsule Commonly known as: VASCEPA TAKE 2 CAPSULES BY MOUTH TWICE  DAILY   Jardiance 25 MG Tabs tablet Generic drug: empagliflozin TAKE 1 TABLET BY MOUTH DAILY   levothyroxine 137 MCG tablet Commonly known as: SYNTHROID TAKE 1 TABLET(137 MCG) BY MOUTH DAILY BEFORE BREAKFAST   lisinopril 5 MG tablet Commonly known as: ZESTRIL TAKE 1 TABLET BY MOUTH DAILY. PLEASE MAKE EVERDUE APPT WITH ALLRED BEFORE ANYMORE REFILLS   metFORMIN 1000 MG tablet Commonly known as: GLUCOPHAGE TAKE 1 TABLET BY MOUTH TWICE DAILY WITH MEALS   Microlet Lancets Misc USE TO CHECK BLOOD SUGAR THREE TIMES DAILY   multivitamin tablet Take 1 tablet by mouth daily.   pantoprazole 40 MG tablet Commonly known as: PROTONIX Take 1 tablet (40 mg total) by mouth daily.   sennosides-docusate sodium 8.6-50 MG tablet Commonly known as: SENOKOT-S Take 1 tablet by mouth daily.   SM Vitamin D3 100 MCG (4000 UT) Caps Generic drug: Cholecalciferol Take by mouth.   Trulicity 3 EV/0.3JK Sopn Generic drug: Dulaglutide INJECT 3 MG INTO THE SKIN ONCE A WEEK   VITAMIN C PO Take 2 tablets by mouth 2 (two) times daily.   Zinc 50 MG Caps          Objective:   Physical Exam BP 113/74 (BP Location: Left Arm, Patient Position: Sitting, Cuff Size: Large)   Pulse 78   Temp 98.1 F (36.7 C) (Oral)   Ht 6\' 1"  (1.854 m)   Wt 228 lb (103.4 kg)   SpO2 98%   BMI 30.08 kg/m  General:   Well developed, NAD, BMI noted. HEENT:  Normocephalic . Face symmetric, atraumatic Lungs:  CTA B Normal respiratory effort, no intercostal retractions, no accessory muscle use. Heart: RRR,  no murmur.  Lower extremities: no pretibial edema bilaterally  Skin: Not pale. Not jaundice Neurologic:  alert & oriented X3.  Speech normal, gait appropriate for age and unassisted Psych--  Cognition and judgment appear intact.  Cooperative with normal attention span and concentration.  Behavior appropriate. No anxious or depressed appearing.      Assessment     Assessment  DM Dr. Dwyane Dee since  07-2014 Hyperlipidemia Hypothyroidism Hyperparathyroidism A flutter, ablation 12-2012, then  Paroxysmal Afib, s/p Maze-PFO closure-[place] 02-2016 GERD Depression Obesity Gout [name], CPAP since 2012; d/c after wt loss 2019  PLAN  DM, saw Endo 08/01/2020, well controlled.  Encourage healthy diet. Hyperlipidemia: Continue atorvastatin, controlled. Hyperkalemia: Mild hyperkalemia noted, he is in a very low dose of lisinopril, recommend a low potassium diet, see AVS. A. fib, cardiomyopathy: Saw cardiology 05/19/2020: Noted to be doing well after the [facility] and LAA clip, off Xarelto, EKG sinus rhythm.  No symptoms.  Check CBC History of [name]: Of CPAP since 2019 due to weight loss, feels well. Preventive care: Hesitant to get the COVID booster shot, benefits discussed. RTC CPX 03-2021     This visit  occurred during the SARS-CoV-2 public health emergency.  Safety protocols were in place, including screening questions prior to the visit, additional usage of staff PPE, and extensive cleaning of exam room while observing appropriate contact time as indicated for disinfecting solutions.

## 2020-08-15 NOTE — Patient Instructions (Addendum)
Per our records you are due for an eye exam. Please contact your eye doctor to schedule an appointment. Please have them send copies of your office visit notes to [place]. Our fax number is [phone].   [email or web address]     GO TO THE LAB : Get the blood work     [place], [place] back for a physical by 03-2021

## 2020-08-16 LAB — CBC WITH DIFFERENTIAL/PLATELET
Basophils Absolute: 0 10*3/uL (ref 0.0–0.2)
Basos: 1 %
EOS (ABSOLUTE): 0.1 10*3/uL (ref 0.0–0.4)
Eos: 3 %
Hematocrit: 43.4 % (ref 37.5–51.0)
Hemoglobin: 15.1 g/dL (ref 13.0–17.7)
Immature Grans (Abs): 0 10*3/uL (ref 0.0–0.1)
Immature Granulocytes: 0 %
Lymphocytes Absolute: 1.7 10*3/uL (ref 0.7–3.1)
Lymphs: 33 %
MCH: 29 pg (ref 26.6–33.0)
MCHC: 34.8 g/dL (ref 31.5–35.7)
MCV: 84 fL (ref 79–97)
Monocytes Absolute: 0.6 10*3/uL (ref 0.1–0.9)
Monocytes: 11 %
Neutrophils Absolute: 2.8 10*3/uL (ref 1.4–7.0)
Neutrophils: 52 %
Platelets: 237 10*3/uL (ref 150–450)
RBC: 5.2 x10E6/uL (ref 4.14–5.80)
RDW: 12.9 % (ref 11.6–15.4)
WBC: 5.3 10*3/uL (ref 3.4–10.8)

## 2020-08-16 NOTE — Assessment & Plan Note (Signed)
DM, saw Endo 08/01/2020, well controlled.  Encourage healthy diet. Hyperlipidemia: Continue atorvastatin, controlled. Hyperkalemia: Mild hyperkalemia noted, he is in a very low dose of lisinopril, recommend a low potassium diet, see AVS. A. fib, cardiomyopathy: Saw cardiology 05/19/2020: Noted to be doing well after the [facility] and LAA clip, off Xarelto, EKG sinus rhythm.  No symptoms.  Check CBC History of [name]: Of CPAP since 2019 due to weight loss, feels well. Preventive care: Hesitant to get the COVID booster shot, benefits discussed. RTC CPX 03-2021

## 2020-08-18 ENCOUNTER — Other Ambulatory Visit: Payer: Self-pay | Admitting: Endocrinology

## 2020-08-18 DIAGNOSIS — E1169 Type 2 diabetes mellitus with other specified complication: Secondary | ICD-10-CM

## 2020-08-20 LAB — HM DIABETES EYE EXAM

## 2020-08-21 MED ORDER — TRULICITY 4.5 MG/0.5ML ~~LOC~~ SOAJ: 4.5000 mg | SUBCUTANEOUS | 0 refills | Status: DC

## 2020-08-27 ENCOUNTER — Encounter: Payer: Self-pay | Admitting: Internal Medicine

## 2020-08-28 ENCOUNTER — Telehealth: Payer: Self-pay | Admitting: *Deleted

## 2020-08-28 NOTE — Telephone Encounter (Signed)
PA started for Vascepa 1GM capsules  Key: [id number]

## 2020-09-02 ENCOUNTER — Other Ambulatory Visit: Payer: Self-pay | Admitting: *Deleted

## 2020-09-02 DIAGNOSIS — E782 Mixed hyperlipidemia: Secondary | ICD-10-CM

## 2020-09-02 MED ORDER — ICOSAPENT ETHYL 1 G PO CAPS: 2.0000 g | ORAL_CAPSULE | Freq: Two times a day (BID) | ORAL | 3 refills | Status: DC

## 2020-09-02 NOTE — Telephone Encounter (Signed)
Insurance called to advise that PA for Vascepa capsules was approved and they will be faxing PA approval today.  Approval code is [id number] and will be approved through 08/28/21

## 2020-09-02 NOTE — Telephone Encounter (Signed)
Notified pt --vascepa have been approved and Resent Rx to the pharmacy

## 2020-09-14 ENCOUNTER — Encounter (HOSPITAL_COMMUNITY): Payer: Self-pay

## 2020-09-14 ENCOUNTER — Other Ambulatory Visit: Payer: Self-pay

## 2020-09-14 ENCOUNTER — Ambulatory Visit (HOSPITAL_COMMUNITY)
Admission: EM | Admit: 2020-09-14 | Discharge: 2020-09-14 | Disposition: A | Discharge status: Home / Self Care | Payer: 59 | Attending: Medical Oncology | Admitting: Medical Oncology

## 2020-09-14 DIAGNOSIS — R0989 Other specified symptoms and signs involving the circulatory and respiratory systems: Secondary | ICD-10-CM

## 2020-09-14 DIAGNOSIS — R059 Cough, unspecified: Secondary | ICD-10-CM

## 2020-09-14 DIAGNOSIS — R0981 Nasal congestion: Secondary | ICD-10-CM | POA: Diagnosis not present

## 2020-09-14 MED ORDER — FLUTICASONE PROPIONATE 50 MCG/ACT NA SUSP: 2.0000 | Freq: Every day | NASAL | 0 refills | Status: AC

## 2020-09-14 MED ORDER — BENZONATATE 200 MG PO CAPS: 200.0000 mg | ORAL_CAPSULE | Freq: Three times a day (TID) | ORAL | 0 refills | Status: DC | PRN

## 2020-09-14 NOTE — ED Triage Notes (Signed)
Pt presents with colored sinus drainage and discomfort in throat X 3 days.

## 2020-09-14 NOTE — ED Provider Notes (Signed)
[facility]    CSN: [id number] Arrival date & time: 09/14/20  1416      History   Chief Complaint Chief Complaint  Patient presents with  . Sinus Issue    HPI Colin Wood is a 61 y.o. male.   HPI   Sinus congestion: Pt states that he has had sinus congestion and drainage for the past 4 days. He has also had a sore throat that has improved.  He states that yesterday he worked out in the yard and developed yellow nasal sinus drainage and congestion and overall just felt "wiped out ".  He denies any fevers, shortness of breath, or sick contacts.  He has been using his normal sinus medications without significant relief.  Past Medical History:  Diagnosis Date  . Anxiety   . Atrial flutter ([place])    typical appearing; s/p ablation 12-2012  . Constipation   . Diabetes mellitus    TYPE 2  . Fatty liver   . GERD (gastroesophageal reflux disease)   . Gout   . Hyperlipidemia   . Hypothyroidism   . Leg edema   . Obesity   . Persistent atrial fibrillation ([place])    PVI 12/2012  . Prediabetes   . Rhinitis    vasomotor  . S/P Minimally invasive maze operation for atrial fibrillation 03/12/2016   Complete bilateral atrial lesion set using cryothermy and bipolar radiofrequency ablation with clipping of LA appendage via right mini thoracotomy approach  . S/P patent foramen ovale closure 03/12/2016  . Sleep apnea    mild, now treated with CPAP since ~9-12    Patient Active Problem List   Diagnosis Date Noted  . Vitamin D deficiency 06/28/2017  . Other fatigue 02/10/2017  . History of atrial flutter 02/10/2017  . Essential hypertension 02/10/2017  . S/P Minimally invasive maze operation for atrial fibrillation + closure PFO 03/12/2016  . S/P patent foramen ovale closure 03/12/2016  . PCP NOTES >>> 03/12/2015  . Hypertriglyceridemia 08/23/2014  . Type II diabetes mellitus, uncontrolled ([place]) 08/23/2014  . Cough 01/08/2013  . Annual physical exam  >>>>>>>>>>>>>>>>>>>>>>>>>> 11/03/2012  . Edema 03/20/2012  . Insomnia 05/21/2011  . [name] (obstructive sleep apnea) 01/07/2011  . Atrial fibrillation ([place]) 12/10/2010  . Obesity 12/10/2010  . GERD 04/08/2009  . DMII (diabetes mellitus, type 2) ([place]) 03/28/2009  . Hypothyroidism 04/25/2007  . Hyperlipidemia 04/25/2007  . Gout 04/25/2007    Past Surgical History:  Procedure Laterality Date  . ABLATION OF DYSRHYTHMIC FOCUS  01/05/2013   PVI and flutter ablation by [name]  . ATRIAL FIBRILLATION ABLATION N/A 01/05/2013   Procedure: ATRIAL FIBRILLATION ABLATION;  Surgeon: Walla Grayer, MD;  Location[facility] CATH LAB;  Service: Cardiovascular;  Laterality: N/A;  . CARDIAC CATHETERIZATION N/A 01/12/2016   Procedure: Right/Left Heart Cath and Coronary Angiography;  Surgeon: [name], MD;  Location: [facility] CV LAB;  Service: Cardiovascular;  Laterality: N/A;  . CARDIOVERSION  03/01/11  . CLIPPING OF ATRIAL APPENDAGE Left 03/12/2016   Procedure: CLIPPING OF ATRIAL APPENDAGE;  Surgeon: [name], MD;  Location: [facility];  Service: Open Heart Surgery;  Laterality: Left;  . MINIMALLY INVASIVE MAZE PROCEDURE N/A 03/12/2016   Procedure: MINIMALLY INVASIVE MAZE PROCEDURE;  Surgeon: [name], MD;  Location: [facility];  Service: Open Heart Surgery;  Laterality: N/A;  . NASAL SINUS SURGERY    . TEE WITHOUT CARDIOVERSION N/A 01/04/2013   Procedure: TRANSESOPHAGEAL ECHOCARDIOGRAM (TEE);  Surgeon: [name], MD;  Location: [facility] ENDOSCOPY;  Service: Cardiovascular;  Laterality: N/A;  . TEE WITHOUT CARDIOVERSION N/A 03/12/2016   Procedure: TRANSESOPHAGEAL ECHOCARDIOGRAM (TEE);  Surgeon: [name], MD;  Location: [facility];  Service: Open Heart Surgery;  Laterality: N/A;  . TONSILLECTOMY    . VASECTOMY         Home Medications    Prior to Admission medications   Medication Sig Start Date End Date Taking? Authorizing Provider  allopurinol (ZYLOPRIM) 100 MG tablet Take 2 tablets (200 mg  total) by mouth daily. 07/07/20   Colon Branch, MD  Ascorbic Acid (VITAMIN C PO) Take 2 tablets by mouth 2 (two) times daily.    [provider]  atorvastatin (LIPITOR) 10 MG tablet TAKE 1 TABLET(10 MG) BY MOUTH DAILY 08/11/20   Elayne Snare, MD  azelastine (ASTELIN) 0.1 % nasal spray Place 2 sprays into both nostrils at bedtime as needed for rhinitis. 02/14/20   Colon Branch, MD  Cholecalciferol (SM VITAMIN D3) 100 MCG (4000 UT) CAPS Take by mouth.    [provider]  colchicine 0.6 MG tablet Take 1 tablet (0.6 mg total) by mouth 2 (two) times daily as needed. Patient not taking: Reported on 08/15/2020 04/18/20   Colon Branch, MD  CONTOUR NEXT TEST test strip USE TO CHECK BLOOD SUGAR THREE TIMES DAILY AS DIRECTED 05/26/20   Elayne Snare, MD  Dulaglutide (TRULICITY) 4.5 GY/1.7CB SOPN Inject 4.5 mg as directed once a week. 08/21/20   Elayne Snare, MD  famotidine (PEPCID) 20 MG tablet Take 1 tablet (20 mg total) by mouth at bedtime. 07/02/20   Colon Branch, MD  fenofibrate 160 MG tablet Take 1 tablet (160 mg total) by mouth daily. 07/02/20   Colon Branch, MD  fluticasone [facility]) 50 MCG/ACT nasal spray Place 1-2 sprays into both nostrils daily.    [provider]  icosapent Ethyl (VASCEPA) 1 g capsule Take 2 capsules (2 g total) by mouth 2 (two) times daily. 09/02/20   Elayne Snare, MD  JARDIANCE 25 MG TABS tablet TAKE 1 TABLET BY MOUTH DAILY 08/18/20   Elayne Snare, MD  levothyroxine (SYNTHROID) 137 MCG tablet TAKE 1 TABLET(137 MCG) BY MOUTH DAILY BEFORE BREAKFAST 04/11/20   Elayne Snare, MD  lisinopril (ZESTRIL) 5 MG tablet TAKE 1 TABLET BY MOUTH DAILY. PLEASE MAKE EVERDUE APPT WITH ALLRED BEFORE ANYMORE REFILLS 01/18/20   Allred, Jeneen Rinks, MD  metFORMIN (GLUCOPHAGE) 1000 MG tablet TAKE 1 TABLET BY MOUTH TWICE DAILY WITH MEALS 08/12/20   Elayne Snare, MD  Microlet Lancets MISC USE TO CHECK BLOOD SUGAR THREE TIMES DAILY 07/25/20   Elayne Snare, MD  Multiple Vitamin (MULTIVITAMIN) tablet Take 1 tablet by  mouth daily.    [provider]  pantoprazole (PROTONIX) 40 MG tablet Take 1 tablet (40 mg total) by mouth daily. 06/09/20   Colon Branch, MD  sennosides-docusate sodium (SENOKOT-S) 8.6-50 MG tablet Take 1 tablet by mouth daily.    [provider]  Zinc 50 MG CAPS  05/31/20   [provider]    Family History Family History  Problem Relation Age of Onset  . Asthma Mother   . Breast cancer Mother   . Dementia Mother   . Diabetes Father   . Hyperlipidemia Father   . Obesity Father   . Atrial fibrillation Father   . Hypertension Brother   . Melanoma Brother   . Allergies Daughter   . Throat cancer Maternal Uncle   . Colon cancer Paternal  Grandfather   . Prostate cancer Neg Hx     Social History Social History   Tobacco Use  . Smoking status: Never Smoker  . Smokeless tobacco: Never Used  Vaping Use  . Vaping Use: Never used  Substance Use Topics  . Alcohol use: No  . Drug use: No     Allergies   Patient has no known allergies.   Review of Systems Review of Systems  As stated above in HPI Physical Exam Triage Vital Signs ED Triage Vitals  Enc Vitals Group     BP 09/14/20 1527 123/80     Pulse Rate 09/14/20 1527 77     Resp 09/14/20 1527 17     Temp 09/14/20 1527 99.6 F (37.6 C)     Temp Source 09/14/20 1527 Oral     SpO2 09/14/20 1527 96 %     Weight --      Height --      Head Circumference --      Peak Flow --      Pain Score 09/14/20 1531 4     Pain Loc --      Pain Edu? --      Excl. in [name]? --    No data found.  Updated Vital Signs BP 123/80 (BP Location: Right Arm)   Pulse 77   Temp 99.6 F (37.6 C) (Oral)   Resp 17   SpO2 96%   Physical Exam Vitals and nursing note reviewed.  Constitutional:      General: He is not in acute distress.    Appearance: Normal appearance. He is not ill-appearing, toxic-appearing or diaphoretic.  HENT:     Head: Normocephalic and atraumatic.     Right Ear: Tympanic membrane, ear  canal and external ear normal.     Left Ear: Tympanic membrane, ear canal and external ear normal.     Nose: Congestion and rhinorrhea present.     Comments: No frontal or maxillary sinus bogginess or tenderness to palpation    Mouth/Throat:     Mouth: Mucous membranes are moist.     Pharynx: Oropharynx is clear. No oropharyngeal exudate or posterior oropharyngeal erythema.     Comments: Mild clear postnasal drainage Eyes:     Extraocular Movements: Extraocular movements intact.     Pupils: Pupils are equal, round, and reactive to light.  Cardiovascular:     Rate and Rhythm: Normal rate and regular rhythm.     Heart sounds: Normal heart sounds.  Pulmonary:     Effort: Pulmonary effort is normal.     Breath sounds: Normal breath sounds.  Musculoskeletal:     Cervical back: Normal range of motion and neck supple.  Lymphadenopathy:     Cervical: No cervical adenopathy.  Neurological:     Mental Status: He is alert and oriented to person, place, and time.      UC Treatments / Results  Labs (all labs ordered are listed, but only abnormal results are displayed) Labs Reviewed - No data to display  EKG   Radiology No results found.  Procedures Procedures (including critical care time)  Medications Ordered in UC Medications - No data to display  Initial Impression / Assessment and Plan / UC Course  I have reviewed the triage vital signs and the nursing notes.  Pertinent labs & imaging results that were available during my care of the patient were reviewed by me and considered in my medical decision making (see chart for details).  New.  Likely viral which I discussed with patient.  He would like to hold off on Covid testing at this time.  We do not have influenza testing and without his history of a fever this is less likely.  For now we will treat with Tessalon and Flonase.  See home directions.  Rest, hydration and discussion of red flag signs and symptoms discussed  with patient. Final Clinical Impressions(s) / UC Diagnoses   Final diagnoses:  None   Discharge Instructions   None    ED Prescriptions    None     PDMP not reviewed this encounter.   Hughie Closs, [place] 09/14/20 1621

## 2020-09-14 NOTE — Discharge Instructions (Signed)
I would recommend that you stay hydrated with water and take Mucinex DM during the day, Tessalon at night.  I would use Flonase 1 spray each nostril twice daily or if this is not offering significant relief you can stop this medication and switch to Afrin which is available over-the-counter and can be used for a maximum of 3 days every 12 hours

## 2020-09-26 ENCOUNTER — Encounter (HOSPITAL_COMMUNITY): Payer: Self-pay

## 2020-09-30 ENCOUNTER — Other Ambulatory Visit: Payer: Self-pay | Admitting: Internal Medicine

## 2020-10-07 ENCOUNTER — Other Ambulatory Visit: Payer: Self-pay | Admitting: Endocrinology

## 2020-10-09 ENCOUNTER — Other Ambulatory Visit: Payer: Self-pay | Admitting: Internal Medicine

## 2020-11-02 ENCOUNTER — Other Ambulatory Visit: Payer: Self-pay | Admitting: Internal Medicine

## 2020-11-09 ENCOUNTER — Other Ambulatory Visit: Payer: Self-pay | Admitting: Endocrinology

## 2020-11-13 ENCOUNTER — Other Ambulatory Visit: Payer: Self-pay | Admitting: Endocrinology

## 2020-11-13 DIAGNOSIS — E669 Obesity, unspecified: Secondary | ICD-10-CM

## 2020-11-14 ENCOUNTER — Ambulatory Visit (HOSPITAL_COMMUNITY): Payer: 59 | Admitting: Nurse Practitioner

## 2020-11-18 ENCOUNTER — Encounter (HOSPITAL_COMMUNITY): Payer: Self-pay | Admitting: Nurse Practitioner

## 2020-11-18 ENCOUNTER — Ambulatory Visit (HOSPITAL_COMMUNITY)
Admission: RE | Admit: 2020-11-18 | Discharge: 2020-11-18 | Disposition: A | Discharge status: Home / Self Care | Payer: 59 | Source: Ambulatory Visit | Attending: Nurse Practitioner | Admitting: Nurse Practitioner

## 2020-11-18 ENCOUNTER — Other Ambulatory Visit: Payer: Self-pay

## 2020-11-18 VITALS — BP 116/64 | HR 80 | Ht 73.0 in | Wt 231.4 lb

## 2020-11-18 DIAGNOSIS — I452 Bifascicular block: Secondary | ICD-10-CM | POA: Diagnosis not present

## 2020-11-18 DIAGNOSIS — Z79899 Other long term (current) drug therapy: Secondary | ICD-10-CM | POA: Diagnosis not present

## 2020-11-18 DIAGNOSIS — I4819 Other persistent atrial fibrillation: Secondary | ICD-10-CM | POA: Diagnosis not present

## 2020-11-18 DIAGNOSIS — E785 Hyperlipidemia, unspecified: Secondary | ICD-10-CM | POA: Insufficient documentation

## 2020-11-18 DIAGNOSIS — I4892 Unspecified atrial flutter: Secondary | ICD-10-CM | POA: Diagnosis not present

## 2020-11-18 DIAGNOSIS — I4891 Unspecified atrial fibrillation: Secondary | ICD-10-CM | POA: Diagnosis present

## 2020-11-18 NOTE — Progress Notes (Signed)
Patient ID: Colin Wood, male   DOB: 16-Aug-1960, 60 y.o.   MRN: [id number]     Primary Care Physician: Colon Branch, MD Referring Physician: Dr. Faythe Ghee is a 60 y.o. male with a h/o PAF. failing tikosyn in the past and as well as s/p ablation in 2014,who  developed  persistent afib. He had a Maze procedure, with clipping of left atrial appendage, last October. He has not had any afib since the 2nd day of surgery. It took about one month to recover but he is now back to baseline and is feeling much better.  F/u in afib clinic, 04/20/18, he continues to do well s/p maze procedure. No afib at all noted. Off AAD for some time now, continues on xarelto without bleeding issues.  F/u in afib clinic, 11/18/20. He remains in SR, s/p maze procedure in 2019. He has not noted any afib since the procedure. He is not on anticoagulation as this point and is ot on CPAP with losing 40 lbs.   Today, he denies symptoms of palpitations, chest pain, shortness of breath, orthopnea, PND, lower extremity edema, dizziness, presyncope, syncope, or neurologic sequela. The patient is tolerating medications without difficulties and is otherwise without complaint today.   Past Medical History:  Diagnosis Date   Anxiety    Atrial flutter ([place])    typical appearing; s/p ablation 12-2012   Constipation    Diabetes mellitus    TYPE 2   Fatty liver    GERD (gastroesophageal reflux disease)    Gout    Hyperlipidemia    Hypothyroidism    Leg edema    Obesity    Persistent atrial fibrillation ([facility])    PVI 12/2012   Prediabetes    Rhinitis    vasomotor   S/P Minimally invasive maze operation for atrial fibrillation 03/12/2016   Complete bilateral atrial lesion set using cryothermy and bipolar radiofrequency ablation with clipping of LA appendage via right mini thoracotomy approach   S/P patent foramen ovale closure 03/12/2016   Sleep apnea    mild, now treated with CPAP since ~9-12   Past  Surgical History:  Procedure Laterality Date   ABLATION OF DYSRHYTHMIC FOCUS  01/05/2013   PVI and flutter ablation by [name]   ATRIAL FIBRILLATION ABLATION N/A 01/05/2013   Procedure: ATRIAL FIBRILLATION ABLATION;  Surgeon: Paddack Grayer, MD;  Location: [facility] CATH LAB;  Service: Cardiovascular;  Laterality: N/A;   CARDIAC CATHETERIZATION N/A 01/12/2016   Procedure: Right/Left Heart Cath and Coronary Angiography;  Surgeon: [name], MD;  Location: [place] CV LAB;  Service: Cardiovascular;  Laterality: N/A;   CARDIOVERSION  03/01/11   CLIPPING OF ATRIAL APPENDAGE Left 03/12/2016   Procedure: CLIPPING OF ATRIAL APPENDAGE;  Surgeon: [name], MD;  Location: [place];  Service: Open Heart Surgery;  Laterality: Left;   MINIMALLY INVASIVE MAZE PROCEDURE N/A 03/12/2016   Procedure: MINIMALLY INVASIVE MAZE PROCEDURE;  Surgeon: [name], MD;  Location: [place];  Service: Open Heart Surgery;  Laterality: N/A;   NASAL SINUS SURGERY     TEE WITHOUT CARDIOVERSION N/A 01/04/2013   Procedure: TRANSESOPHAGEAL ECHOCARDIOGRAM (TEE);  Surgeon: [name], MD;  [facility] ENDOSCOPY;  Service: Cardiovascular;  Laterality: N/A;   TEE WITHOUT CARDIOVERSION N/A 03/12/2016   Procedure: TRANSESOPHAGEAL ECHOCARDIOGRAM (TEE);  Surgeon: [name], MD;  Location: [place];  Service: Open Heart Surgery;  Laterality: N/A;   TONSILLECTOMY     VASECTOMY  Current Outpatient Medications  Medication Sig Dispense Refill   allopurinol (ZYLOPRIM) 100 MG tablet Take 2 tablets (200 mg total) by mouth daily. 180 tablet 1   Ascorbic Acid (VITAMIN C PO) Take 2 tablets by mouth 2 (two) times daily.     atorvastatin (LIPITOR) 10 MG tablet TAKE 1 TABLET(10 MG) BY MOUTH DAILY 90 tablet 1   azelastine (ASTELIN) 0.1 % nasal spray Place 2 sprays into both nostrils at bedtime as needed for rhinitis or allergies. 30 mL 3   Cholecalciferol (SM VITAMIN D3) 100 MCG (4000 UT) CAPS Take by mouth.     CONTOUR NEXT TEST  test strip USE TO CHECK BLOOD SUGAR THREE TIMES DAILY AS DIRECTED 300 strip 3   famotidine (PEPCID) 20 MG tablet Take 1 tablet (20 mg total) by mouth at bedtime. 90 tablet 1   fenofibrate 160 MG tablet Take 1 tablet (160 mg total) by mouth daily. 90 tablet 2   fluticasone (FLONASE) 50 MCG/ACT nasal spray Place 2 sprays into both nostrils daily. 16 mL 0   icosapent Ethyl (VASCEPA) 1 g capsule Take 2 capsules (2 g total) by mouth 2 (two) times daily. 360 capsule 3   JARDIANCE 25 MG TABS tablet TAKE 1 TABLET BY MOUTH DAILY 90 tablet 1   levothyroxine (SYNTHROID) 137 MCG tablet TAKE 1 TABLET(137 MCG) BY MOUTH DAILY BEFORE BREAKFAST 90 tablet 1   lisinopril (ZESTRIL) 5 MG tablet Take 1 tablet (5 mg total) by mouth daily. 90 tablet 2   Magnesium 500 MG TABS Take 1 tablet by mouth daily in the afternoon.     metFORMIN (GLUCOPHAGE) 1000 MG tablet TAKE 1 TABLET BY MOUTH TWICE DAILY WITH MEALS 180 tablet 1   Microlet Lancets MISC USE TO CHECK BLOOD SUGAR THREE TIMES DAILY 100 each 3   Multiple Vitamin (MULTIVITAMIN) tablet Take 1 tablet by mouth daily.     pantoprazole (PROTONIX) 40 MG tablet Take 1 tablet (40 mg total) by mouth daily. 90 tablet 3   sennosides-docusate sodium (SENOKOT-S) 8.6-50 MG tablet Take 1 tablet by mouth daily.     TRULICITY 4.5 GG/2.6RS SOPN INJECT 4.5MG  AS DIRECTED ONCE A WEEK 6 mL 0   Zinc 50 MG CAPS      No current facility-administered medications for this encounter.    No Known Allergies  Social History   Socioeconomic History   Marital status: Married    Spouse name: Mardene Celeste   Number of children: 2   Years of education: Not on file   Highest education level: Not on file  Occupational History   Occupation: [employer]  Tobacco Use   Smoking status: Never   Smokeless tobacco: Never  Vaping Use   Vaping Use: Never used  Substance and Sexual Activity   Alcohol use: No   Drug use: No   Sexual activity: Not on file  Other Topics Concern   Not on file  Social  History Narrative   Pt lives in [place] with spouse and 1 child   2 children  1988, 2000 .     Works in Engineer, technical sales at [employer] Strain: Not on [employer] Insecurity: Not on file  Transportation Needs: Not on file  Physical Activity: Not on file  Stress: Not on file  Social Connections: Not on file  Intimate Partner Violence: Not on file    Family History  Problem Relation Age of Onset   Asthma Mother  Breast cancer Mother    Dementia Mother    Diabetes Father    Hyperlipidemia Father    Obesity Father    Atrial fibrillation Father    Hypertension Brother    Melanoma Brother    Allergies Daughter    Throat cancer Maternal Uncle    Colon cancer Paternal Grandfather    Prostate cancer Neg Hx     ROS- All systems are reviewed and negative except as per the HPI above  Physical Exam: Vitals:   11/18/20 0845  BP: 116/64  Pulse: 80  Weight: 105 kg  Height: 6\' 1"  (1.854 m)    GEN- The patient is well appearing, alert and oriented x 3 today.   Head- normocephalic, atraumatic Eyes-  Sclera clear, conjunctiva pink Ears- hearing intact Oropharynx- clear Neck- supple, no JVP Lymph- no cervical lymphadenopathy Lungs- Clear to ausculation bilaterally, normal work of breathing Heart -regular rate and rhythm, no murmurs, rubs or gallops, PMI not laterally displaced GI- soft, NT, ND, + BS Extremities- no clubbing, cyanosis, or edema MS- no significant deformity or atrophy Skin- no rash or lesion Psych- euthymic mood, full affect Neuro- strength and sensation are intact  EKG-NSR at 80 bpm, LAD, RBBB, PR int 172 ms, qrs int 128 ms, qtc 475 ms Epic records reviewed  Assessment and Plan: 1. AFib s/p maze procedure Maintaining SR since procedure off AAD therapy Off xarelto with chadsvasc score of 2 (htn, DM)  2. Lifestyle factors Encouraged exercise, weight loss States no longer has snoring with weight loss and is not  using   F/u with [name] in 6 months  Geroge Baseman. Gustav Knueppel, [facility] [address] [place], [place] [address] [phone]

## 2020-11-25 ENCOUNTER — Other Ambulatory Visit: Payer: Self-pay | Admitting: Endocrinology

## 2020-11-25 DIAGNOSIS — E559 Vitamin D deficiency, unspecified: Secondary | ICD-10-CM

## 2020-11-25 DIAGNOSIS — E782 Mixed hyperlipidemia: Secondary | ICD-10-CM

## 2020-11-25 DIAGNOSIS — E1169 Type 2 diabetes mellitus with other specified complication: Secondary | ICD-10-CM

## 2020-11-25 DIAGNOSIS — E063 Autoimmune thyroiditis: Secondary | ICD-10-CM

## 2020-11-27 ENCOUNTER — Other Ambulatory Visit: Payer: Self-pay

## 2020-11-28 ENCOUNTER — Other Ambulatory Visit: Payer: 59

## 2020-11-28 ENCOUNTER — Other Ambulatory Visit: Payer: Self-pay

## 2020-11-29 LAB — COMPREHENSIVE METABOLIC PANEL
ALT: 16 IU/L (ref 0–44)
AST: 19 IU/L (ref 0–40)
Albumin/Globulin Ratio: 2.6 — ABNORMAL HIGH (ref 1.2–2.2)
Albumin: 5 g/dL — ABNORMAL HIGH (ref 3.8–4.8)
Alkaline Phosphatase: 52 IU/L (ref 44–121)
BUN/Creatinine Ratio: 16 (ref 10–24)
BUN: 17 mg/dL (ref 8–27)
Bilirubin Total: 0.4 mg/dL (ref 0.0–1.2)
CO2: 22 mmol/L (ref 20–29)
Calcium: 10.3 mg/dL — ABNORMAL HIGH (ref 8.6–10.2)
Chloride: 101 mmol/L (ref 96–106)
Creatinine, Ser: 1.09 mg/dL (ref 0.76–1.27)
Globulin, Total: 1.9 g/dL (ref 1.5–4.5)
Glucose: 107 mg/dL — ABNORMAL HIGH (ref 65–99)
Potassium: 4.9 mmol/L (ref 3.5–5.2)
Sodium: 140 mmol/L (ref 134–144)
Total Protein: 6.9 g/dL (ref 6.0–8.5)
eGFR: 77 mL/min/{1.73_m2} (ref >59)

## 2020-11-29 LAB — VITAMIN D 25 HYDROXY (VIT D DEFICIENCY, FRACTURES): Vit D, 25-Hydroxy: 44.1 ng/mL (ref 30.0–100.0)

## 2020-11-29 LAB — LIPID PANEL
Chol/HDL Ratio: 3 ratio (ref 0.0–5.0)
Cholesterol, Total: 154 mg/dL (ref 100–199)
HDL: 51 mg/dL (ref >39)
LDL Chol Calc (NIH): 85 mg/dL (ref 0–99)
Triglycerides: 97 mg/dL (ref 0–149)
VLDL Cholesterol Cal: 18 mg/dL (ref 5–40)

## 2020-11-29 LAB — HEMOGLOBIN A1C
Est. average glucose Bld gHb Est-mCnc: 126 mg/dL
Hgb A1c MFr Bld: 6 % — ABNORMAL HIGH (ref 4.8–5.6)

## 2020-11-29 LAB — TSH: TSH: 1.32 u[IU]/mL (ref 0.450–4.500)

## 2020-11-29 LAB — SPECIMEN STATUS REPORT

## 2020-12-04 NOTE — Progress Notes (Signed)
Patient ID: Colin Wood, male   DOB: 05/12/1961, 60 y.o.   MRN: [id number]           Reason for Appointment: Follow-up for various problems  Referring physician: Larose Kells  History of Present Illness:          Diagnosis: Type 2 diabetes mellitus, date of diagnosis: ?  2011        Past history: He was not having any symptoms at the time of diagnosis and not clear what his initial blood sugars were He was started on metformin initially and had fairly good control Subsequently with higher sugars he was given Amaryl also in addition which has been continued.  A1c was 10.6% in 2012 Subsequently in 2014 his A1c was down to 6.5 but he had a regular follow-up subsequently Because of an A1c of 8.1 in 02/2014 he was given Tradjenta in addition to the above drugs but this was not effective  He was referred here because of a high A1c of 7.9 in 07/2014  Recent history:   Oral hypoglycemic drugs the patient is taking are: metformin 1 g twice a day, Trulicity 4.5 mg weekly, Jardiance 25 mg daily      A1c is excellent at 6%  Current management, blood sugar values and problems identified: He has been taking the higher dose of Trulicity since March However this has not been able to get his weight down  He will still trying to be overall reducing portions and calories he will sometimes have snacks like ice cream or popcorn at bedtime This is not consistent his fasting lab glucose 05/31/2005 Still is not motivated to exercise although he thinks he can do it after dinner, currently not only doing because of the heat No nausea with the new dose of Trulicity Has a couple of readings later in the day which are fairly good except higher after breakfast he went out and ate a biscuit at breakfast        Side effects from medications have been:None  Compliance with the medical regimen: Fair   Glucose monitoring:  done less than once a day       Glucometer: Contour  .      Blood Glucose readings and averages  from download:    PRE-MEAL Fasting Lunch Dinner Bedtime Overall  Glucose range: 116-137      Mean/median: 126    137   POST-MEAL PC Breakfast PC  PC Dinner  Glucose range:  100-187 153, 157  Mean/median:      Prior  PRE-MEAL Fasting Lunch Dinner Bedtime Overall  Glucose range:  113-205   108  144  100-209  Mean/median: 148       POST-MEAL PC Breakfast PC Lunch PC Dinner  Glucose range:     Mean/median:  203        Self-care: Meal planning is fair, sometimes getting biscuits in the morning          Dietician visit, most recent: none, previously had gone to a class          Weight history: Previous range 260-280  Wt Readings from Last 3 Encounters:  12/05/20 229 lb (103.9 kg)  11/18/20 231 lb 6.4 oz (105 kg)  08/15/20 228 lb (103.4 kg)    Glycemic control:   Lab Results  Component Value Date   HGBA1C 6.0 (H) 11/28/2020   HGBA1C 6.2 (H) 07/25/2020   HGBA1C 6.0 (H) 03/25/2020   Lab Results  Component Value Date  [place] 85 11/28/2020   CREATININE 1.09 11/28/2020     Allergies as of 12/05/2020   No Known Allergies      Medication List        Accurate as of December 05, 2020  8:13 AM. If you have any questions, ask your nurse or doctor.          allopurinol 100 MG tablet Commonly known as: ZYLOPRIM Take 2 tablets (200 mg total) by mouth daily.   atorvastatin 10 MG tablet Commonly known as: LIPITOR TAKE 1 TABLET(10 MG) BY MOUTH DAILY   azelastine 0.1 % nasal spray Commonly known as: ASTELIN Place 2 sprays into both nostrils at bedtime as needed for rhinitis or allergies.   Contour Next Test test strip Generic drug: glucose blood USE TO CHECK BLOOD SUGAR THREE TIMES DAILY AS DIRECTED   famotidine 20 MG tablet Commonly known as: PEPCID Take 1 tablet (20 mg total) by mouth at bedtime.   fenofibrate 160 MG tablet Take 1 tablet (160 mg total) by mouth daily.   fluticasone 50 MCG/ACT nasal spray Commonly known as: FLONASE Place 2 sprays into  both nostrils daily.   icosapent Ethyl 1 g capsule Commonly known as: VASCEPA Take 2 capsules (2 g total) by mouth 2 (two) times daily.   Jardiance 25 MG Tabs tablet Generic drug: empagliflozin TAKE 1 TABLET BY MOUTH DAILY   levothyroxine 137 MCG tablet Commonly known as: SYNTHROID TAKE 1 TABLET(137 MCG) BY MOUTH DAILY BEFORE BREAKFAST   lisinopril 5 MG tablet Commonly known as: ZESTRIL Take 1 tablet (5 mg total) by mouth daily.   Magnesium 500 MG Tabs Take 1 tablet by mouth daily in the afternoon.   metFORMIN 1000 MG tablet Commonly known as: GLUCOPHAGE TAKE 1 TABLET BY MOUTH TWICE DAILY WITH MEALS   Microlet Lancets Misc USE TO CHECK BLOOD SUGAR THREE TIMES DAILY   multivitamin tablet Take 1 tablet by mouth daily.   pantoprazole 40 MG tablet Commonly known as: PROTONIX Take 1 tablet (40 mg total) by mouth daily.   sennosides-docusate sodium 8.6-50 MG tablet Commonly known as: SENOKOT-S Take 1 tablet by mouth daily.   SM Vitamin D3 100 MCG (4000 UT) Caps Generic drug: Cholecalciferol Take by mouth.   Trulicity 4.5 HE/5.2DP Sopn Generic drug: Dulaglutide INJECT 4.5MG  AS DIRECTED ONCE A WEEK   VITAMIN C PO Take 2 tablets by mouth 2 (two) times daily.   Zinc 50 MG Caps        Allergies:  No Known Allergies  Past Medical History:  Diagnosis Date   Anxiety    Atrial flutter ([place])    typical appearing; s/p ablation 12-2012   Constipation    Diabetes mellitus    TYPE 2   Fatty liver    GERD (gastroesophageal reflux disease)    Gout    Hyperlipidemia    Hypothyroidism    Leg edema    Obesity    Persistent atrial fibrillation ([place])    PVI 12/2012   Prediabetes    Rhinitis    vasomotor   S/P Minimally invasive maze operation for atrial fibrillation 03/12/2016   Complete bilateral atrial lesion set using cryothermy and bipolar radiofrequency ablation with clipping of LA appendage via right mini thoracotomy approach   S/P patent foramen ovale  closure 03/12/2016   Sleep apnea    mild, now treated with CPAP since ~9-12    Past Surgical History:  Procedure Laterality Date   ABLATION OF DYSRHYTHMIC FOCUS  01/05/2013  PVI and flutter ablation by [name]   ATRIAL FIBRILLATION ABLATION N/A 01/05/2013   Procedure: ATRIAL FIBRILLATION ABLATION;  Surgeon: Solar Grayer, MD;  Location[facility] CATH LAB;  Service: Cardiovascular;  Laterality: N/A;   CARDIAC CATHETERIZATION N/A 01/12/2016   Procedure: Right/Left Heart Cath and Coronary Angiography;  Surgeon: [name], MD;  Location: [place] CV LAB;  Service: Cardiovascular;  Laterality: N/A;   CARDIOVERSION  03/01/11   CLIPPING OF ATRIAL APPENDAGE Left 03/12/2016   Procedure: CLIPPING OF ATRIAL APPENDAGE;  Surgeon: [name], MD;  Location: [place];  Service: Open Heart Surgery;  Laterality: Left;   MINIMALLY INVASIVE MAZE PROCEDURE N/A 03/12/2016   Procedure: MINIMALLY INVASIVE MAZE PROCEDURE;  Surgeon: [name], MD;  Location: [place];  Service: Open Heart Surgery;  Laterality: N/A;   NASAL SINUS SURGERY     TEE WITHOUT CARDIOVERSION N/A 01/04/2013   Procedure: TRANSESOPHAGEAL ECHOCARDIOGRAM (TEE);  Surgeon: [name], MD;  Location: [facility] ENDOSCOPY;  Service: Cardiovascular;  Laterality: N/A;   TEE WITHOUT CARDIOVERSION N/A 03/12/2016   Procedure: TRANSESOPHAGEAL ECHOCARDIOGRAM (TEE);  Surgeon: [name], MD;  Location: [place];  Service: Open Heart Surgery;  Laterality: N/A;   TONSILLECTOMY     VASECTOMY      Family History  Problem Relation Age of Onset   Asthma Mother    Breast cancer Mother    Dementia Mother    Diabetes Father    Hyperlipidemia Father    Obesity Father    Atrial fibrillation Father    Hypertension Brother    Melanoma Brother    Allergies Daughter    Throat cancer Maternal Uncle    Colon cancer Paternal Grandfather    Prostate cancer Neg Hx     Social History:  reports that he has never smoked. He has never used smokeless tobacco. He  reports that he does not drink alcohol and does not use drugs.    Review of Systems         Lipids:  has high triglycerides mostly previously as high as 681; LDL previously also above 100 at baseline.   On Fenofibrate and Vascepa for triglycerides, also taking Lipitor 10 mg daily He has been on either Vascepa or Lovaza since about 2016 and has not had any issues with atrial fibrillation for the last few years  His LDL is slightly better controlled and triglycerides are back down below 100 now However still can do a little better with some snacks and eating out at breakfast      . Lab Results  Component Value Date   CHOL 154 11/28/2020   CHOL 165 07/25/2020   CHOL 193 03/25/2020   Lab Results  Component Value Date   HDL 51 11/28/2020   HDL 49 07/25/2020   HDL 49 03/25/2020   Lab Results  Component Value Date   LDLCALC 85 11/28/2020   LDLCALC 91 07/25/2020   LDLCALC 116 (H) 03/25/2020   Lab Results  Component Value Date   TRIG 97 11/28/2020   TRIG 146 07/25/2020   TRIG 158 (H) 03/25/2020   Lab Results  Component Value Date   CHOLHDL 3.0 11/28/2020   CHOLHDL 3.4 07/25/2020   CHOLHDL 3.9 03/25/2020   No results found for: LDLDIRECT                  HYPOTHYROIDISM: He was diagnosed to have primary hypothyroidism in 1994  He is consistent with his generic levothyroxine every morning before eating  Has been on levothyroxine 137 mcg daily TSH consistently normal  Lab Results  Component Value Date   TSH 1.320 11/28/2020   TSH 1.170 07/25/2020   TSH 0.899 03/25/2020   FREET4 1.77 07/25/2020   FREET4 1.49 03/25/2020   FREET4 1.86 (H) 11/14/2019       The blood pressure has been treated by cardiologist with low-dose lisinopril and metoprolol   BP Readings from Last 3 Encounters:  12/05/20 126/82  11/18/20 116/64  09/14/20 123/80   No recurrence of hyperkalemia  Lab Results  Component Value Date   K 4.9 11/28/2020    Vitamin D deficiency:  Has been  prescribed vitamin D weekly with levels as low as 25, taking 2000 units vitamin D3 regularly Levels are improved 3  Lab Results  Component Value Date   VD25OH 44.1 11/28/2020   VD25OH 31.2 03/25/2020   VD25OH 33.6 07/11/2019   VD25OH 25.1 (L) 03/21/2019     HYPERCALCEMIA: The calcium continues to be slightly high or upper normal, also note that his albumin level is slightly high PTH level has been normal, last 26 No history of kidney stones or known osteopenia  Lab Results  Component Value Date   CALCIUM 10.3 (H) 11/28/2020   CALCIUM 10.4 (H) 07/25/2020   CALCIUM 10.1 03/25/2020   CALCIUM 10.3 (H) 11/14/2019   Lab Results  Component Value Date   PTH 26 07/11/2019   CALCIUM 10.3 (H) 11/28/2020   CAION 1.15 03/13/2016     He has been prescribed allopurinol by PCP  Physical Examination:  BP 126/82   Pulse 78   Ht 6\' 1"  (1.854 m)   Wt 229 lb (103.9 kg)   SpO2 98%   BMI 30.21 kg/m      ASSESSMENT on 03/28/2020:  Diabetes type 2, with mild obesity  He is on a 3 drug regimen of metformin, Trulicity 4.5 mg and Jardiance 25 mg   See history of present illness for discussion of his current management, blood sugar patterns and problems identified.   His A1c is slightly better at 6.0 Fasting readings are relatively better with increasing Trulicity However he is not able to lose weight since his last visit Highest reading 187 at home   HYPERPARATHYROIDISM: Mild and has only minimal hypercalcemia which is stable   HYPOTHYROIDISM: TSH now consistently normal with taking 137 mcg levothyroxine   Lipids: Both LDL and triglycerides are improved further and results were discussed  Vitamin D deficiency adequately controlled  Mild hypertension: Controlled only on 5 mg lisinopril   PLAN:   No change in diabetes regimen Continue same dose of Synthroid We will try to stop fenofibrate and continue Vascepa to see if he can still control triglycerides with Vascepa and  further improvement in diet and weight loss He agrees to try and start exercising evening after dinner Monitor blood sugars at different times Stay on the same supplement of vitamin D Follow-up in 4 months again    There are no Patient Instructions on file for this visit.     Elayne Snare 12/05/2020, 8:13 AM   Note: This office note was prepared with Dragon voice recognition system technology. Any transcriptional errors that result from this process are unintentional.

## 2020-12-05 ENCOUNTER — Other Ambulatory Visit: Payer: Self-pay

## 2020-12-05 ENCOUNTER — Ambulatory Visit: Payer: 59 | Admitting: Endocrinology

## 2020-12-05 ENCOUNTER — Encounter: Payer: Self-pay | Admitting: Endocrinology

## 2020-12-05 VITALS — BP 126/82 | HR 78 | Ht 73.0 in | Wt 229.0 lb

## 2020-12-05 DIAGNOSIS — E782 Mixed hyperlipidemia: Secondary | ICD-10-CM

## 2020-12-05 DIAGNOSIS — E1169 Type 2 diabetes mellitus with other specified complication: Secondary | ICD-10-CM | POA: Diagnosis not present

## 2020-12-05 DIAGNOSIS — E669 Obesity, unspecified: Secondary | ICD-10-CM

## 2020-12-05 DIAGNOSIS — E063 Autoimmune thyroiditis: Secondary | ICD-10-CM

## 2020-12-05 DIAGNOSIS — E790 Hyperuricemia without signs of inflammatory arthritis and tophaceous disease: Secondary | ICD-10-CM

## 2020-12-05 NOTE — Patient Instructions (Signed)
Stop Fenofibrate  Walk daily

## 2020-12-29 ENCOUNTER — Other Ambulatory Visit: Payer: Self-pay | Admitting: Internal Medicine

## 2021-01-11 ENCOUNTER — Other Ambulatory Visit: Payer: Self-pay | Admitting: Internal Medicine

## 2021-03-30 ENCOUNTER — Other Ambulatory Visit: Payer: Self-pay | Admitting: Endocrinology

## 2021-04-03 ENCOUNTER — Other Ambulatory Visit (INDEPENDENT_AMBULATORY_CARE_PROVIDER_SITE_OTHER): Payer: 59

## 2021-04-03 ENCOUNTER — Other Ambulatory Visit: Payer: Self-pay

## 2021-04-03 DIAGNOSIS — E669 Obesity, unspecified: Secondary | ICD-10-CM

## 2021-04-03 DIAGNOSIS — E063 Autoimmune thyroiditis: Secondary | ICD-10-CM

## 2021-04-03 DIAGNOSIS — E782 Mixed hyperlipidemia: Secondary | ICD-10-CM

## 2021-04-03 DIAGNOSIS — E1169 Type 2 diabetes mellitus with other specified complication: Secondary | ICD-10-CM

## 2021-04-03 DIAGNOSIS — E790 Hyperuricemia without signs of inflammatory arthritis and tophaceous disease: Secondary | ICD-10-CM

## 2021-04-04 LAB — COMPREHENSIVE METABOLIC PANEL
ALT: 23 IU/L (ref 0–44)
AST: 18 IU/L (ref 0–40)
Albumin/Globulin Ratio: 2.6 — ABNORMAL HIGH (ref 1.2–2.2)
Albumin: 5 g/dL — ABNORMAL HIGH (ref 3.8–4.8)
Alkaline Phosphatase: 76 IU/L (ref 44–121)
BUN/Creatinine Ratio: 24 (ref 10–24)
BUN: 21 mg/dL (ref 8–27)
Bilirubin Total: 0.5 mg/dL (ref 0.0–1.2)
CO2: 25 mmol/L (ref 20–29)
Calcium: 10.2 mg/dL (ref 8.6–10.2)
Chloride: 100 mmol/L (ref 96–106)
Creatinine, Ser: 0.89 mg/dL (ref 0.76–1.27)
Globulin, Total: 1.9 g/dL (ref 1.5–4.5)
Glucose: 134 mg/dL — ABNORMAL HIGH (ref 70–99)
Potassium: 4.6 mmol/L (ref 3.5–5.2)
Sodium: 141 mmol/L (ref 134–144)
Total Protein: 6.9 g/dL (ref 6.0–8.5)
eGFR: 97 mL/min/{1.73_m2} (ref >59)

## 2021-04-04 LAB — HEMOGLOBIN A1C
Est. average glucose Bld gHb Est-mCnc: 128 mg/dL
Hgb A1c MFr Bld: 6.1 % — ABNORMAL HIGH (ref 4.8–5.6)

## 2021-04-04 LAB — VITAMIN B12: Vitamin B-12: 697 pg/mL (ref 232–1245)

## 2021-04-04 LAB — TSH: TSH: 1.8 u[IU]/mL (ref 0.450–4.500)

## 2021-04-04 LAB — URIC ACID: Uric Acid: 5.7 mg/dL (ref 3.8–8.4)

## 2021-04-04 LAB — LIPID PANEL
Chol/HDL Ratio: 4 ratio (ref 0.0–5.0)
Cholesterol, Total: 157 mg/dL (ref 100–199)
HDL: 39 mg/dL — ABNORMAL LOW (ref >39)
LDL Chol Calc (NIH): 71 mg/dL (ref 0–99)
Triglycerides: 290 mg/dL — ABNORMAL HIGH (ref 0–149)
VLDL Cholesterol Cal: 47 mg/dL — ABNORMAL HIGH (ref 5–40)

## 2021-04-10 ENCOUNTER — Other Ambulatory Visit: Payer: Self-pay

## 2021-04-10 ENCOUNTER — Encounter: Payer: Self-pay | Admitting: Endocrinology

## 2021-04-10 ENCOUNTER — Ambulatory Visit: Payer: 59 | Admitting: Endocrinology

## 2021-04-10 VITALS — BP 122/76 | HR 88 | Ht 73.0 in | Wt 226.2 lb

## 2021-04-10 DIAGNOSIS — E782 Mixed hyperlipidemia: Secondary | ICD-10-CM

## 2021-04-10 DIAGNOSIS — E669 Obesity, unspecified: Secondary | ICD-10-CM

## 2021-04-10 DIAGNOSIS — E1169 Type 2 diabetes mellitus with other specified complication: Secondary | ICD-10-CM

## 2021-04-10 DIAGNOSIS — E559 Vitamin D deficiency, unspecified: Secondary | ICD-10-CM

## 2021-04-10 DIAGNOSIS — E063 Autoimmune thyroiditis: Secondary | ICD-10-CM

## 2021-04-10 MED ORDER — FENOFIBRATE 160 MG PO TABS: 160.0000 mg | ORAL_TABLET | Freq: Every day | ORAL | 2 refills | Status: DC

## 2021-04-10 NOTE — Progress Notes (Signed)
Patient ID: Colin Wood, male   DOB: 05-12-1961, 60 y.o.   MRN: [id number]           Reason for Appointment: Follow-up for various problems  Referring physician: Larose Kells  History of Present Illness:          Diagnosis: Type 2 diabetes mellitus, date of diagnosis: ?  2011        Past history: He was not having any symptoms at the time of diagnosis and not clear what his initial blood sugars were He was started on metformin initially and had fairly good control Subsequently with higher sugars he was given Amaryl also in addition which has been continued.  A1c was 10.6% in 2012 Subsequently in 2014 his A1c was down to 6.5 but he had a regular follow-up subsequently Because of an A1c of 8.1 in 02/2014 he was given Tradjenta in addition to the above drugs but this was not effective  He was referred here because of a high A1c of 7.9 in 07/2014  Recent history:   Oral hypoglycemic drugs the patient is taking are: metformin 1 g twice a day, Trulicity 4.5 mg weekly, Jardiance 25 mg daily      A1c is stable at 6.1, previously was at 6%  Current management, blood sugar values and problems identified: He has somewhat higher readings at times in the mornings  However does not check readings after meals monitor and has only a couple of readings in the evenings Highest reading was 202 after lunch  Apparently in the last few weeks he has been eating out much more and not able to have home-cooked food Generally eating out and may not have any low-fat meals at times He also has not exercised consistently Although previously for 3 weeks he was walking about 3 miles a day on most days  Also with the higher dose of Trulicity he has had better satiety  Has been consistent with taking his weekly  Weight is down about 3 pounds         Side effects from medications have been:None  Compliance with the medical regimen: Fair   Glucose monitoring:  done less than once a day       Glucometer: Contour  .       Blood Glucose readings and averages from download:   PRE-MEAL Fasting Lunch Dinner Bedtime Overall  Glucose range:  116-157    105-202  Mean/median: 136   138 137   POST-MEAL PC Breakfast PC Lunch PC Dinner  Glucose range:  202   Mean/median:      Previously  PRE-MEAL Fasting Lunch Dinner Bedtime Overall  Glucose range: 116-137      Mean/median: 126    137   POST-MEAL PC Breakfast PC  PC Dinner  Glucose range:  100-187 153, 157  Mean/median:         Self-care: Meal planning is fair, sometimes getting biscuits in the morning          Dietician visit, most recent: none, previously had gone to a class          Weight history: Previous range 260-280  Wt Readings from Last 3 Encounters:  04/10/21 226 lb 3.2 oz (102.6 kg)  12/05/20 229 lb (103.9 kg)  11/18/20 231 lb 6.4 oz (105 kg)    Glycemic control:   Lab Results  Component Value Date   HGBA1C 6.1 (H) 04/03/2021   HGBA1C 6.0 (H) 11/28/2020   HGBA1C 6.2 (H) 07/25/2020  Lab Results  Component Value Date   LDLCALC 71 04/03/2021   CREATININE 0.89 04/03/2021     Allergies as of 04/10/2021   No Known Allergies      Medication List        Accurate as of April 10, 2021  9:02 PM. If you have any questions, ask your nurse or doctor.          STOP taking these medications    famotidine 20 MG tablet Commonly known as: PEPCID Stopped by: Elayne Snare, MD       TAKE these medications    allopurinol 100 MG tablet Commonly known as: ZYLOPRIM Take 2 tablets (200 mg total) by mouth daily.   atorvastatin 10 MG tablet Commonly known as: LIPITOR TAKE 1 TABLET(10 MG) BY MOUTH DAILY   azelastine 0.1 % nasal spray Commonly known as: ASTELIN Place 2 sprays into both nostrils at bedtime as needed for rhinitis or allergies.   Contour Next Test test strip Generic drug: glucose blood CHECK BLOOD SUGAR THREE TIMES DAILY AS DIRECTED   fenofibrate 160 MG tablet Take 1 tablet (160 mg total) by mouth  daily.   fluticasone 50 MCG/ACT nasal spray Commonly known as: FLONASE Place 2 sprays into both nostrils daily.   icosapent Ethyl 1 g capsule Commonly known as: VASCEPA Take 2 capsules (2 g total) by mouth 2 (two) times daily.   Jardiance 25 MG Tabs tablet Generic drug: empagliflozin TAKE 1 TABLET BY MOUTH DAILY   levothyroxine 137 MCG tablet Commonly known as: SYNTHROID TAKE 1 TABLET(137 MCG) BY MOUTH DAILY BEFORE BREAKFAST   lisinopril 5 MG tablet Commonly known as: ZESTRIL Take 1 tablet (5 mg total) by mouth daily.   Magnesium 500 MG Tabs Take 1 tablet by mouth daily in the afternoon.   metFORMIN 1000 MG tablet Commonly known as: GLUCOPHAGE TAKE 1 TABLET BY MOUTH TWICE DAILY WITH MEALS   Microlet Lancets Misc USE TO CHECK BLOOD SUGAR THREE TIMES DAILY   multivitamin tablet Take 1 tablet by mouth daily.   pantoprazole 40 MG tablet Commonly known as: PROTONIX Take 1 tablet (40 mg total) by mouth daily.   sennosides-docusate sodium 8.6-50 MG tablet Commonly known as: SENOKOT-S Take 1 tablet by mouth daily.   SM Vitamin D3 100 MCG (4000 UT) Caps Generic drug: Cholecalciferol Take by mouth.   Trulicity 4.5 QQ/7.6PP Sopn Generic drug: Dulaglutide INJECT 4.5MG  AS DIRECTED ONCE A WEEK   VITAMIN C PO Take 2 tablets by mouth 2 (two) times daily.   Zinc 50 MG Caps        Allergies:  No Known Allergies  Past Medical History:  Diagnosis Date   Anxiety    Atrial flutter ([place])    typical appearing; s/p ablation 12-2012   Constipation    Diabetes mellitus    TYPE 2   Fatty liver    GERD (gastroesophageal reflux disease)    Gout    Hyperlipidemia    Hypothyroidism    Leg edema    Obesity    Persistent atrial fibrillation ([facility])    PVI 12/2012   Prediabetes    Rhinitis    vasomotor   S/P Minimally invasive maze operation for atrial fibrillation 03/12/2016   Complete bilateral atrial lesion set using cryothermy and bipolar radiofrequency ablation with  clipping of LA appendage via right mini thoracotomy approach   S/P patent foramen ovale closure 03/12/2016   Sleep apnea    mild, now treated with CPAP since ~9-12  Past Surgical History:  Procedure Laterality Date   ABLATION OF DYSRHYTHMIC FOCUS  01/05/2013   PVI and flutter ablation by [name]   ATRIAL FIBRILLATION ABLATION N/A 01/05/2013   Procedure: ATRIAL FIBRILLATION ABLATION;  Surgeon: Lecount Grayer, MD;  Location: [facility] CATH LAB;  Service: Cardiovascular;  Laterality: N/A;   CARDIAC CATHETERIZATION N/A 01/12/2016   Procedure: Right/Left Heart Cath and Coronary Angiography;  Surgeon: [name], MD;  Location: [place] CV LAB;  Service: Cardiovascular;  Laterality: N/A;   CARDIOVERSION  03/01/11   CLIPPING OF ATRIAL APPENDAGE Left 03/12/2016   Procedure: CLIPPING OF ATRIAL APPENDAGE;  Surgeon: [name], MD;  Location: [place];  Service: Open Heart Surgery;  Laterality: Left;   MINIMALLY INVASIVE MAZE PROCEDURE N/A 03/12/2016   Procedure: MINIMALLY INVASIVE MAZE PROCEDURE;  Surgeon: [name], MD;  Location: [place];  Service: Open Heart Surgery;  Laterality: N/A;   NASAL SINUS SURGERY     TEE WITHOUT CARDIOVERSION N/A 01/04/2013   Procedure: TRANSESOPHAGEAL ECHOCARDIOGRAM (TEE);  Surgeon: [name], MD;  Location[facility] ENDOSCOPY;  Service: Cardiovascular;  Laterality: N/A;   TEE WITHOUT CARDIOVERSION N/A 03/12/2016   Procedure: TRANSESOPHAGEAL ECHOCARDIOGRAM (TEE);  Surgeon: [name], MD;  Location: [place];  Service: Open Heart Surgery;  Laterality: N/A;   TONSILLECTOMY     VASECTOMY      Family History  Problem Relation Age of Onset   Asthma Mother    Breast cancer Mother    Dementia Mother    Diabetes Father    Hyperlipidemia Father    Obesity Father    Atrial fibrillation Father    Hypertension Brother    Melanoma Brother    Allergies Daughter    Throat cancer Maternal Uncle    Colon cancer Paternal Grandfather    Prostate cancer Neg Hx      Social History:  reports that he has never smoked. He has never used smokeless tobacco. He reports that he does not drink alcohol and does not use drugs.    Review of Systems         Lipids:  has high triglycerides mostly previously as high as 681; LDL previously also above 100 at baseline.   On Vascepa for triglycerides, also taking Lipitor 10 mg daily He has been on either Vascepa or Lovaza since about 2016 and has not had any issues with atrial fibrillation for the last few years  Since his triglycerides are only 97 he is on a trial of stopping fenofibrate but his triglycerides are significantly higher Also he has been off his diet for the last few weeks  HDL also lower      . Lab Results  Component Value Date   CHOL 157 04/03/2021   CHOL 154 11/28/2020   CHOL 165 07/25/2020   Lab Results  Component Value Date   HDL 39 (L) 04/03/2021   HDL 51 11/28/2020   HDL 49 07/25/2020   Lab Results  Component Value Date   LDLCALC 71 04/03/2021   LDLCALC 85 11/28/2020   LDLCALC 91 07/25/2020   Lab Results  Component Value Date   TRIG 290 (H) 04/03/2021   TRIG 97 11/28/2020   TRIG 146 07/25/2020   Lab Results  Component Value Date   CHOLHDL 4.0 04/03/2021   CHOLHDL 3.0 11/28/2020   CHOLHDL 3.4 07/25/2020   No results found for: LDLDIRECT  HYPOTHYROIDISM: He was diagnosed to have primary hypothyroidism in 1994  He is consistent with his generic levothyroxine every morning before eating  Has been on levothyroxine 137 mcg daily TSH consistently normal  Lab Results  Component Value Date   TSH 1.800 04/03/2021   TSH 1.320 11/28/2020   TSH 1.170 07/25/2020   FREET4 1.77 07/25/2020   FREET4 1.49 03/25/2020   FREET4 1.86 (H) 11/14/2019       The blood pressure has been treated by cardiologist with low-dose lisinopril    BP Readings from Last 3 Encounters:  04/10/21 122/76  12/05/20 126/82  11/18/20 116/64   No recurrence of  hyperkalemia  Lab Results  Component Value Date   K 4.6 04/03/2021    Vitamin D deficiency:  Has been prescribed vitamin D weekly with levels as low as 25, taking 4000 units vitamin D3 regularly Levels as follows  Lab Results  Component Value Date   VD25OH 44.1 11/28/2020   VD25OH 31.2 03/25/2020   VD25OH 33.6 07/11/2019   VD25OH 25.1 (L) 03/21/2019     HYPERCALCEMIA: The calcium continues to be slightly high or upper normal, also note that his albumin level is slightly high PTH level has been normal, last 26 No history of kidney stones or known osteopenia  Lab Results  Component Value Date   CALCIUM 10.2 04/03/2021   CALCIUM 10.3 (H) 11/28/2020   CALCIUM 10.4 (H) 07/25/2020   CALCIUM 10.1 03/25/2020   Lab Results  Component Value Date   PTH 26 07/11/2019   CALCIUM 10.2 04/03/2021   CAION 1.15 03/13/2016     He has been prescribed allopurinol by PCP  Physical Examination:  BP 122/76   Pulse 88   Ht 6\' 1"  (1.854 m)   Wt 226 lb 3.2 oz (102.6 kg)   SpO2 97%   BMI 29.84 kg/m      ASSESSMENT on 03/28/2020:  Diabetes type 2, with mild obesity  He is on a 3 drug regimen of metformin, Trulicity 4.5 mg and Jardiance 25 mg   See history of present illness for discussion of his current management, blood sugar patterns and problems identified.   His A1c is about the same and 6.1 now However he tends to have relatively high fasting readings  Recently this may be due to lack of exercise consistently and also going off his diet frequently with eating out  Not many readings being done after meals, highest 202  However his weight is less and likely benefiting from 4.5 mg Trulicity dose    HYPERPARATHYROIDISM: Upper normal calcium   HYPOTHYROIDISM: TSH consistently normal with taking 137 mcg levothyroxine   Lipids: He has mixed hyperlipidemia and with stopping fenofibrate triglycerides are significantly higher although diet has been suboptimal also HDL  lower  Mild hypertension: Controlled only on 5 mg lisinopril  Uric acid normal on allopurinol  PLAN:   No change in diabetes regimen Encouraged him to start back on walking exercise or other activities Needs to do more readings after meals especially supper He will try to get back on his diet and avoid a lot of high fat foods and eating out  Continue same dose of Synthroid Restart FENOFIBRATE  Follow-up in 4 months again    Patient Instructions  Check blood sugars on waking up 2-3 days a week  Also check blood sugars about 2 hours after meals and do this after different meals by rotation  Recommended blood sugar levels on waking up are 90-130 and about  2 hours after meal is 130-180  Please bring your blood sugar monitor to each visit, thank you     Elayne Snare 04/10/2021, 9:02 PM   Note: This office note was prepared with Dragon voice recognition system technology. Any transcriptional errors that result from this process are unintentional.

## 2021-04-10 NOTE — Patient Instructions (Signed)
Check blood sugars on waking up 2-3 days a week  Also check blood sugars about 2 hours after meals and do this after different meals by rotation  Recommended blood sugar levels on waking up are 90-130 and about 2 hours after meal is 130-180  Please bring your blood sugar monitor to each visit, thank you   

## 2021-04-17 ENCOUNTER — Other Ambulatory Visit: Payer: Self-pay

## 2021-04-17 ENCOUNTER — Encounter: Payer: Self-pay | Admitting: Internal Medicine

## 2021-04-17 ENCOUNTER — Ambulatory Visit: Payer: 59 | Admitting: Internal Medicine

## 2021-04-17 VITALS — BP 132/68 | HR 86 | Temp 97.9°F | Resp 16 | Ht 73.0 in | Wt 227.5 lb

## 2021-04-17 DIAGNOSIS — E119 Type 2 diabetes mellitus without complications: Secondary | ICD-10-CM

## 2021-04-17 DIAGNOSIS — Z1211 Encounter for screening for malignant neoplasm of colon: Secondary | ICD-10-CM

## 2021-04-17 DIAGNOSIS — Z7185 Encounter for immunization safety counseling: Secondary | ICD-10-CM

## 2021-04-17 DIAGNOSIS — Z23 Encounter for immunization: Secondary | ICD-10-CM | POA: Diagnosis not present

## 2021-04-17 DIAGNOSIS — E038 Other specified hypothyroidism: Secondary | ICD-10-CM | POA: Diagnosis not present

## 2021-04-17 DIAGNOSIS — I4891 Unspecified atrial fibrillation: Secondary | ICD-10-CM

## 2021-04-17 NOTE — Progress Notes (Signed)
Subjective:    Patient ID: Colin Wood, male    DOB: [birth year], 60 y.o.   MRN: [id number]  DOS:  04/17/2021 Type of visit - description: f/u Since the last visit is feeling well. Saw cardiology , Endo, notes reviewed. Available labs reviewed. Denies any LUTS Denies lower extremity edema, tingling or paresthesias.  Wt Readings from Last 3 Encounters:  04/17/21 227 lb 8 oz (103.2 kg)  04/10/21 226 lb 3.2 oz (102.6 kg)  12/05/20 229 lb (103.9 kg)     Review of Systems See above   Past Medical History:  Diagnosis Date   Anxiety    Atrial flutter ([place])    typical appearing; s/p ablation 12-2012   Constipation    Diabetes mellitus    TYPE 2   Fatty liver    GERD (gastroesophageal reflux disease)    Gout    Hyperlipidemia    Hypothyroidism    Leg edema    Obesity    Persistent atrial fibrillation (HCC)    PVI 12/2012   Prediabetes    Rhinitis    vasomotor   S/P Minimally invasive maze operation for atrial fibrillation 03/12/2016   Complete bilateral atrial lesion set using cryothermy and bipolar radiofrequency ablation with clipping of LA appendage via right mini thoracotomy approach   S/P patent foramen ovale closure 03/12/2016   Sleep apnea    mild, now treated with CPAP since ~9-12    Past Surgical History:  Procedure Laterality Date   ABLATION OF DYSRHYTHMIC FOCUS  01/05/2013   PVI and flutter ablation by [name]   ATRIAL FIBRILLATION ABLATION N/A 01/05/2013   Procedure: ATRIAL FIBRILLATION ABLATION;  Surgeon: Simonet Grayer, MD;  Location: [facility] CATH LAB;  Service: Cardiovascular;  Laterality: N/A;   CARDIAC CATHETERIZATION N/A 01/12/2016   Procedure: Right/Left Heart Cath and Coronary Angiography;  Surgeon: [name], MD;  Location: [place] CV LAB;  Service: Cardiovascular;  Laterality: N/A;   CARDIOVERSION  03/01/11   CLIPPING OF ATRIAL APPENDAGE Left 03/12/2016   Procedure: CLIPPING OF ATRIAL APPENDAGE;  Surgeon: [name], MD;  Location:  [place];  Service: Open Heart Surgery;  Laterality: Left;   MINIMALLY INVASIVE MAZE PROCEDURE N/A 03/12/2016   Procedure: MINIMALLY INVASIVE MAZE PROCEDURE;  Surgeon: [name], MD;  Location: [place];  Service: Open Heart Surgery;  Laterality: N/A;   NASAL SINUS SURGERY     TEE WITHOUT CARDIOVERSION N/A 01/04/2013   Procedure: TRANSESOPHAGEAL ECHOCARDIOGRAM (TEE);  Surgeon: [name], MD;  Location: [facility] ENDOSCOPY;  Service: Cardiovascular;  Laterality: N/A;   TEE WITHOUT CARDIOVERSION N/A 03/12/2016   Procedure: TRANSESOPHAGEAL ECHOCARDIOGRAM (TEE);  Surgeon: [name], MD;  Location: [place];  Service: Open Heart Surgery;  Laterality: N/A;   TONSILLECTOMY     VASECTOMY      Allergies as of 04/17/2021   No Known Allergies      Medication List        Accurate as of April 17, 2021  4:55 PM. If you have any questions, ask your nurse or doctor.          allopurinol 100 MG tablet Commonly known as: ZYLOPRIM Take 2 tablets (200 mg total) by mouth daily.   atorvastatin 10 MG tablet Commonly known as: LIPITOR TAKE 1 TABLET(10 MG) BY MOUTH DAILY   azelastine 0.1 % nasal spray Commonly known as: ASTELIN Place 2 sprays into both nostrils at bedtime as needed for rhinitis or allergies.   Contour Next Test  test strip Generic drug: glucose blood CHECK BLOOD SUGAR THREE TIMES DAILY AS DIRECTED   famotidine 20 MG tablet Commonly known as: PEPCID Take 20 mg by mouth daily.   fenofibrate 160 MG tablet Take 1 tablet (160 mg total) by mouth daily.   fluticasone 50 MCG/ACT nasal spray Commonly known as: FLONASE Place 2 sprays into both nostrils daily.   icosapent Ethyl 1 g capsule Commonly known as: VASCEPA Take 2 capsules (2 g total) by mouth 2 (two) times daily.   Jardiance 25 MG Tabs tablet Generic drug: empagliflozin TAKE 1 TABLET BY MOUTH DAILY   levothyroxine 137 MCG tablet Commonly known as: SYNTHROID TAKE 1 TABLET(137 MCG) BY MOUTH DAILY BEFORE BREAKFAST    lisinopril 5 MG tablet Commonly known as: ZESTRIL Take 1 tablet (5 mg total) by mouth daily.   Magnesium 500 MG Tabs Take 1 tablet by mouth daily in the afternoon.   metFORMIN 1000 MG tablet Commonly known as: GLUCOPHAGE TAKE 1 TABLET BY MOUTH TWICE DAILY WITH MEALS   Microlet Lancets Misc USE TO CHECK BLOOD SUGAR THREE TIMES DAILY   multivitamin tablet Take 1 tablet by mouth daily.   pantoprazole 40 MG tablet Commonly known as: PROTONIX Take 1 tablet (40 mg total) by mouth daily.   sennosides-docusate sodium 8.6-50 MG tablet Commonly known as: SENOKOT-S Take 1 tablet by mouth daily.   SM Vitamin D3 100 MCG (4000 UT) Caps Generic drug: Cholecalciferol Take by mouth.   Trulicity 4.5 OI/3.7CW Sopn Generic drug: Dulaglutide INJECT 4.5MG  AS DIRECTED ONCE A WEEK   VITAMIN C PO Take 2 tablets by mouth 2 (two) times daily.   Zinc 50 MG Caps           Objective:   Physical Exam BP 132/68 (BP Location: Left Arm, Patient Position: Sitting, Cuff Size: Normal)   Pulse 86   Temp 97.9 F (36.6 C) (Oral)   Resp 16   Ht 6\' 1"  (1.854 m)   Wt 227 lb 8 oz (103.2 kg)   SpO2 97%   BMI 30.02 kg/m  General:   Well developed, NAD, BMI noted. HEENT:  Normocephalic . Face symmetric, atraumatic Lungs:  CTA B Normal respiratory effort, no intercostal retractions, no accessory muscle use. Heart: RRR,  no murmur.  DM foot exam: No edema, good pulses, pinprick examination DRE: Declined Skin: Not pale. Not jaundice Neurologic:  alert & oriented X3.  Speech normal, gait appropriate for age and unassisted Psych--  Cognition and judgment appear intact.  Cooperative with normal attention span and concentration.  Behavior appropriate. No anxious or depressed appearing.      Assessment    Assessment  DM Dr. Dwyane Dee since 07-2014 Hyperlipidemia Hypothyroidism Hyperparathyroidism A flutter, ablation 12-2012, then  Paroxysmal Afib, s/p Maze-PFO closure-[place] 02-2016, not  anticoagulated GERD Depression Obesity Gout [name], CPAP since 2012; d/c after wt loss 2019  PLAN  DM, hyperparathyroidism, hypothyroidism, high cholesterol Saw Endo 04/10/2021,  stable Feet exam negative History of a flutter and A. Fib Saw cardiology 11/18/2020, noted to be on NSR. Preventive care: Extensive discussion about benefits of COVID-vaccine and shingles.  Plans to proceed with a shingles shot at the next visit. Declined DRE, denies LUTS Recommend to call GI to schedule his next colonoscopy RTC 3 to 4 months for    Time spent 20 minutes, mostly counseling regards vaccines preventive care. Also reviewed notes from Endo on cardiology. This visit occurred during the SARS-CoV-2 public health emergency.  Safety protocols were in place, including screening  questions prior to the visit, additional usage of staff PPE, and extensive cleaning of exam room while observing appropriate contact time as indicated for disinfecting solutions.

## 2021-04-17 NOTE — Patient Instructions (Addendum)
You are due for your repeat colonoscopy with Dr. Loletha Carrow at [place] 05/2021. Please call his office at [phone] to schedule that.   Proceed with SHINGRIX at the next visit or at your [place] back for   a physical exam in 3 to 4 months

## 2021-04-17 NOTE — Assessment & Plan Note (Signed)
Assessment  DM Dr. Dwyane Dee since 07-2014 Hyperlipidemia Hypothyroidism Hyperparathyroidism A flutter, ablation 12-2012, then  Paroxysmal Afib, s/p Maze-PFO closure-[place]lip 02-2016, not anticoagulated GERD Depression Obesity Gout [name], CPAP since 2012; d/c after wt loss 2019  PLAN  DM, hyperparathyroidism, hypothyroidism, high cholesterol Saw Endo 04/10/2021,  stable Feet exam negative History of a flutter and A. Fib Saw cardiology 11/18/2020, noted to be on NSR. Preventive care: Extensive discussion about benefits of COVID-vaccine and shingles.  Plans to proceed with a shingles shot at the next visit. Declined DRE, denies LUTS Recommend to call GI to schedule his next colonoscopy RTC 3 to 4 months for

## 2021-05-11 ENCOUNTER — Encounter: Payer: Self-pay | Admitting: Endocrinology

## 2021-05-11 DIAGNOSIS — E1169 Type 2 diabetes mellitus with other specified complication: Secondary | ICD-10-CM

## 2021-05-13 MED ORDER — DEXCOM G6 TRANSMITTER MISC: 3 refills | Status: DC

## 2021-05-13 MED ORDER — DEXCOM G6 RECEIVER DEVI: 0 refills | Status: DC

## 2021-05-13 MED ORDER — DEXCOM G6 SENSOR MISC: 3 refills | Status: DC

## 2021-05-15 ENCOUNTER — Other Ambulatory Visit: Payer: Self-pay

## 2021-05-15 DIAGNOSIS — E1169 Type 2 diabetes mellitus with other specified complication: Secondary | ICD-10-CM

## 2021-05-15 DIAGNOSIS — E669 Obesity, unspecified: Secondary | ICD-10-CM

## 2021-05-15 MED ORDER — TRULICITY 4.5 MG/0.5ML ~~LOC~~ SOAJ: SUBCUTANEOUS | 2 refills | Status: DC

## 2021-05-18 ENCOUNTER — Encounter: Payer: Self-pay | Admitting: Internal Medicine

## 2021-05-18 ENCOUNTER — Other Ambulatory Visit: Payer: Self-pay

## 2021-05-18 ENCOUNTER — Ambulatory Visit: Payer: 59 | Admitting: Internal Medicine

## 2021-05-18 VITALS — BP 124/72 | HR 84 | Ht 73.0 in | Wt 230.2 lb

## 2021-05-18 DIAGNOSIS — I4819 Other persistent atrial fibrillation: Secondary | ICD-10-CM

## 2021-05-18 DIAGNOSIS — Z9889 Other specified postprocedural states: Secondary | ICD-10-CM | POA: Diagnosis not present

## 2021-05-18 DIAGNOSIS — G4733 Obstructive sleep apnea (adult) (pediatric): Secondary | ICD-10-CM | POA: Diagnosis not present

## 2021-05-18 DIAGNOSIS — Z8679 Personal history of other diseases of the circulatory system: Secondary | ICD-10-CM

## 2021-05-18 DIAGNOSIS — R000 Tachycardia, unspecified: Secondary | ICD-10-CM | POA: Diagnosis not present

## 2021-05-18 DIAGNOSIS — I43 Cardiomyopathy in diseases classified elsewhere: Secondary | ICD-10-CM

## 2021-05-18 MED ORDER — LISINOPRIL 5 MG PO TABS: 5.0000 mg | ORAL_TABLET | Freq: Every day | ORAL | 3 refills | Status: DC

## 2021-05-18 NOTE — Patient Instructions (Addendum)
Medication Instructions:  Your physician recommends that you continue on your current medications as directed. Please refer to the Current Medication list given to you today. *If you need a refill on your cardiac medications before your next appointment, please call your pharmacy*  Lab Work: None. If you have labs (blood work) drawn today and your tests are completely normal, you will receive your results only by: [place] (if you have MyChart) OR A paper copy in the mail If you have any lab test that is abnormal or we need to change your treatment, we will call you to review the results.  Testing/Procedures: None.  Follow-Up: At [facility], you and your health needs are our priority.  As part of our continuing mission to provide you with exceptional heart care, we have created designated Provider Care Teams.  These Care Teams include your primary Cardiologist (physician) and Advanced Practice Providers (APPs -  Physician Assistants and Nurse Practitioners) who all work together to provide you with the care you need, when you need it.  Your physician wants you to follow-up in: 12 months in the [facility]. They will contact you to schedule.    You will receive a reminder letter in the mail two months in advance. If you don't receive a letter, please call our office to schedule the follow-up appointment.  We recommend signing up for the patient portal called "MyChart".  Sign up information is provided on this After Visit Summary.  MyChart is used to connect with patients for Virtual Visits (Telemedicine).  Patients are able to view lab/test results, encounter notes, upcoming appointments, etc.  Non-urgent messages can be sent to your provider as well.   To learn more about what you can do with MyChart, go to [email or web address].    Any Other Special Instructions Will Be Listed Below (If Applicable).

## 2021-05-18 NOTE — Progress Notes (Signed)
PCP: Colon Branch, MD   Primary EP: [name]  Colin Wood is a 60 y.o. male who presents today for routine electrophysiology followup.  Since last being seen in our clinic, the patient reports doing very well.  Today, he denies symptoms of palpitations, chest pain, shortness of breath,  lower extremity edema, dizziness, presyncope, or syncope.  The patient is otherwise without complaint today.   Past Medical History:  Diagnosis Date   Anxiety    Atrial flutter ([place])    typical appearing; s/p ablation 12-2012   Constipation    Diabetes mellitus    TYPE 2   Fatty liver    GERD (gastroesophageal reflux disease)    Gout    Hyperlipidemia    Hypothyroidism    Leg edema    Obesity    Persistent atrial fibrillation (HCC)    PVI 12/2012   Prediabetes    Rhinitis    vasomotor   S/P Minimally invasive maze operation for atrial fibrillation 03/12/2016   Complete bilateral atrial lesion set using cryothermy and bipolar radiofrequency ablation with clipping of LA appendage via right mini thoracotomy approach   S/P patent foramen ovale closure 03/12/2016   Sleep apnea    mild, now treated with CPAP since ~9-12   Past Surgical History:  Procedure Laterality Date   ABLATION OF DYSRHYTHMIC FOCUS  01/05/2013   PVI and flutter ablation by [name]   ATRIAL FIBRILLATION ABLATION N/A 01/05/2013   Procedure: ATRIAL FIBRILLATION ABLATION;  Surgeon: Gonzalez Grayer, MD;  Location: [facility] CATH LAB;  Service: Cardiovascular;  Laterality: N/A;   CARDIAC CATHETERIZATION N/A 01/12/2016   Procedure: Right/Left Heart Cath and Coronary Angiography;  Surgeon: [name], MD;  Location: [place] CV LAB;  Service: Cardiovascular;  Laterality: N/A;   CARDIOVERSION  03/01/11   CLIPPING OF ATRIAL APPENDAGE Left 03/12/2016   Procedure: CLIPPING OF ATRIAL APPENDAGE;  Surgeon: [name], MD;  Location: [place];  Service: Open Heart Surgery;  Laterality: Left;   MINIMALLY INVASIVE MAZE PROCEDURE N/A  03/12/2016   Procedure: MINIMALLY INVASIVE MAZE PROCEDURE;  Surgeon: [name], MD;  Location: [place];  Service: Open Heart Surgery;  Laterality: N/A;   NASAL SINUS SURGERY     TEE WITHOUT CARDIOVERSION N/A 01/04/2013   Procedure: TRANSESOPHAGEAL ECHOCARDIOGRAM (TEE);  Surgeon: [name], MD;  Location: [facility] ENDOSCOPY;  Service: Cardiovascular;  Laterality: N/A;   TEE WITHOUT CARDIOVERSION N/A 03/12/2016   Procedure: TRANSESOPHAGEAL ECHOCARDIOGRAM (TEE);  Surgeon: [name], MD;  Location: [place];  Service: Open Heart Surgery;  Laterality: N/A;   TONSILLECTOMY     VASECTOMY      ROS- all systems are reviewed and negatives except as per HPI above  Current Outpatient Medications  Medication Sig Dispense Refill   allopurinol (ZYLOPRIM) 100 MG tablet Take 2 tablets (200 mg total) by mouth daily. 180 tablet 1   Ascorbic Acid (VITAMIN C PO) Take 2 tablets by mouth 2 (two) times daily.     atorvastatin (LIPITOR) 10 MG tablet TAKE 1 TABLET(10 MG) BY MOUTH DAILY 90 tablet 1   azelastine (ASTELIN) 0.1 % nasal spray Place 2 sprays into both nostrils at bedtime as needed for rhinitis or allergies. 30 mL 3   Cholecalciferol (SM VITAMIN D3) 100 MCG (4000 UT) CAPS Take by mouth.     Continuous Blood Gluc Receiver (DEXCOM G6 RECEIVER) DEVI Use to check blood sugar daily 1 each 0   Continuous Blood Gluc Sensor (DEXCOM  G6 SENSOR) MISC Change sensor every 10 days 3 each 3   Continuous Blood Gluc Transmit (DEXCOM G6 TRANSMITTER) MISC Change every 3 months 1 each 3   Dulaglutide (TRULICITY) 4.5 CH/8.8FO SOPN INJECT 4.5MG  AS DIRECTED ONCE A WEEK 6 mL 2   famotidine (PEPCID) 20 MG tablet Take 20 mg by mouth daily.     fenofibrate 160 MG tablet Take 1 tablet (160 mg total) by mouth daily. 90 tablet 2   fluticasone (FLONASE) 50 MCG/ACT nasal spray Place 2 sprays into both nostrils daily. 16 mL 0   glucose blood (CONTOUR NEXT TEST) test strip CHECK BLOOD SUGAR THREE TIMES DAILY AS DIRECTED 300 strip 3    icosapent Ethyl (VASCEPA) 1 g capsule Take 2 capsules (2 g total) by mouth 2 (two) times daily. 360 capsule 3   JARDIANCE 25 MG TABS tablet TAKE 1 TABLET BY MOUTH DAILY 90 tablet 1   levothyroxine (SYNTHROID) 137 MCG tablet TAKE 1 TABLET(137 MCG) BY MOUTH DAILY BEFORE BREAKFAST 90 tablet 1   lisinopril (ZESTRIL) 5 MG tablet Take 1 tablet (5 mg total) by mouth daily. 90 tablet 2   Magnesium 500 MG TABS Take 1 tablet by mouth daily in the afternoon.     metFORMIN (GLUCOPHAGE) 1000 MG tablet TAKE 1 TABLET BY MOUTH TWICE DAILY WITH MEALS 180 tablet 1   Microlet Lancets MISC USE TO CHECK BLOOD SUGAR THREE TIMES DAILY 100 each 3   Multiple Vitamin (MULTIVITAMIN) tablet Take 1 tablet by mouth daily.     pantoprazole (PROTONIX) 40 MG tablet Take 1 tablet (40 mg total) by mouth daily. 90 tablet 3   sennosides-docusate sodium (SENOKOT-S) 8.6-50 MG tablet Take 1 tablet by mouth daily.     Zinc 50 MG CAPS      No current facility-administered medications for this visit.    Physical Exam: Vitals:   05/18/21 0919  BP: 124/72  Pulse: 84  SpO2: 95%  Weight: 230 lb 3.2 oz (104.4 kg)  Height: 6\' 1"  (1.854 m)    GEN- The patient is well appearing, alert and oriented x 3 today.   Head- normocephalic, atraumatic Eyes-  Sclera clear, conjunctiva pink Ears- hearing intact Oropharynx- clear Lungs- Clear to ausculation bilaterally, normal work of breathing Heart- Regular rate and rhythm, no murmurs, rubs or gallops, PMI not laterally displaced GI- soft, NT, ND, + BS Extremities- no clubbing, cyanosis, or edema  Wt Readings from Last 3 Encounters:  05/18/21 230 lb 3.2 oz (104.4 kg)  04/17/21 227 lb 8 oz (103.2 kg)  04/10/21 226 lb 3.2 oz (102.6 kg)    EKG tracing ordered today is personally reviewed and shows sinus, RBBB  Assessment and Plan:  Persistent afib Well controlled s/p MAZE and LAA clip Not on xarelto (chads2vasc score is 1)  2. Overweight Body mass index is 30.37 kg/m. He has  worked diligently on this  3. [name] Stable No change required today  4. Tachycardia mediated CM Resolved with sinus  AF clinic in a year  Arvidson Grayer MD, [facility] 05/18/2021 9:32 AM

## 2021-05-19 ENCOUNTER — Other Ambulatory Visit (HOSPITAL_COMMUNITY): Payer: Self-pay

## 2021-05-19 ENCOUNTER — Telehealth: Payer: Self-pay

## 2021-05-19 NOTE — Telephone Encounter (Signed)
Patient Advocate Encounter   Received notification from patient's pharmacy that prior authorization for Dexcom G6 Transmitter is required by his/her insurance [employer].   PA submitted on 05/19/21  Key#: [id number]  Status is pending    [facility] will continue to follow:  Patient Advocate Fax:  [phone]

## 2021-05-19 NOTE — Telephone Encounter (Signed)
[employer] called to get clinical information for this PA - please contact [employer] at [phone]   Ref #:  [id number]

## 2021-05-20 ENCOUNTER — Other Ambulatory Visit (HOSPITAL_COMMUNITY): Payer: Self-pay

## 2021-05-26 ENCOUNTER — Other Ambulatory Visit (HOSPITAL_COMMUNITY): Payer: Self-pay

## 2021-05-27 ENCOUNTER — Other Ambulatory Visit (HOSPITAL_COMMUNITY): Payer: Self-pay

## 2021-05-27 NOTE — Telephone Encounter (Signed)
Patient Advocate Encounter  Prior Authorization for [employer] Receiver has been approved.    PA#: [id number]  Effective dates: 05/13/21 through 05/27/22  Per Test Claim Patients co-pay is $0.   Spoke with Pharmacy to Process.  Patient Advocate Fax:  [phone]

## 2021-05-28 ENCOUNTER — Other Ambulatory Visit (HOSPITAL_COMMUNITY): Payer: Self-pay

## 2021-05-28 ENCOUNTER — Other Ambulatory Visit: Payer: Self-pay | Admitting: Endocrinology

## 2021-05-28 DIAGNOSIS — E782 Mixed hyperlipidemia: Secondary | ICD-10-CM

## 2021-05-28 NOTE — Telephone Encounter (Signed)
Patient Advocate Encounter   Received notification from patient calls that prior authorization for Dexcom Sensors is required by his/her insurance [employer].   PA submitted on 05/28/21  Key#: [id number]   Status is pending    [facility] will continue to follow:  Patient Advocate Fax:  [phone]

## 2021-05-28 NOTE — Telephone Encounter (Signed)
Patient Advocate Encounter   Received notification from patient calls that prior authorization for Dexcom Transmitter is required by his/her insurance [employer].   PA submitted on 05/28/21  Key#: [id number]  Status is pending    [facility] will continue to follow:  Patient Advocate Fax:  [phone]

## 2021-05-28 NOTE — Telephone Encounter (Signed)
Patient Advocate Encounter  Prior Authorization for Dexcom sensors has been approved.    PA#: [id number]  Effective dates: 05/28/21 through 05/28/22  Per Test Claim Patients co-pay is $0.   Spoke with Pharmacy to Process.  Patient Advocate Fax:  [phone]

## 2021-05-29 ENCOUNTER — Other Ambulatory Visit: Payer: Self-pay

## 2021-05-29 ENCOUNTER — Other Ambulatory Visit (HOSPITAL_COMMUNITY): Payer: Self-pay

## 2021-05-29 DIAGNOSIS — E669 Obesity, unspecified: Secondary | ICD-10-CM

## 2021-05-29 MED ORDER — METFORMIN HCL 1000 MG PO TABS: 1000.0000 mg | ORAL_TABLET | Freq: Two times a day (BID) | ORAL | 1 refills | Status: DC

## 2021-05-29 NOTE — Telephone Encounter (Signed)
Patient Advocate Encounter  Prior Authorization for [employer] has been approved.    PA# N/A  Effective dates: 05/29/21 through 05/29/22  Per Test Claim Patients co-pay is $0.   Spoke with Pharmacy to Process.  Patient Advocate Fax:  [phone]

## 2021-06-17 ENCOUNTER — Other Ambulatory Visit: Payer: Self-pay | Admitting: Endocrinology

## 2021-06-17 ENCOUNTER — Encounter: Payer: Self-pay | Admitting: Endocrinology

## 2021-06-17 DIAGNOSIS — E1169 Type 2 diabetes mellitus with other specified complication: Secondary | ICD-10-CM

## 2021-06-18 ENCOUNTER — Other Ambulatory Visit: Payer: Self-pay

## 2021-06-18 ENCOUNTER — Encounter: Payer: Self-pay | Admitting: Gastroenterology

## 2021-06-18 ENCOUNTER — Ambulatory Visit (AMBULATORY_SURGERY_CENTER): Payer: 59

## 2021-06-18 VITALS — Ht 73.0 in | Wt 225.0 lb

## 2021-06-18 DIAGNOSIS — E669 Obesity, unspecified: Secondary | ICD-10-CM

## 2021-06-18 DIAGNOSIS — Z86010 Personal history of colonic polyps: Secondary | ICD-10-CM

## 2021-06-18 DIAGNOSIS — E1169 Type 2 diabetes mellitus with other specified complication: Secondary | ICD-10-CM

## 2021-06-18 MED ORDER — EMPAGLIFLOZIN 25 MG PO TABS: 25.0000 mg | ORAL_TABLET | Freq: Every day | ORAL | 1 refills | Status: DC

## 2021-06-18 MED ORDER — NA SULFATE-K SULFATE-MG SULF 17.5-3.13-1.6 GM/177ML PO SOLN: 1.0000 | Freq: Once | ORAL | 0 refills | Status: AC

## 2021-06-18 NOTE — Progress Notes (Signed)
No egg or soy allergy known to patient  No issues known to pt with past sedation with any surgeries or procedures Patient denies ever being told they had issues or difficulty with intubation  No FH of Malignant Hyperthermia Pt is not on diet pills Pt is not on home 02  Pt is not on blood thinners  Pt denies issues with constipation at this time- takes stool softeners for relief (hemorrhoid related); No A flutter/  Hx of persistent AFIB dx--had surgery and no longer has AFIB Pt is fully vaccinated for Covid x 2; NO PA's for preps discussed with pt in PV today  Discussed with pt there will be an out-of-pocket cost for prep and that varies from $0 to 70 + dollars - pt verbalized understanding  Due to the COVID-19 pandemic we are asking patients to follow certain guidelines in PV and the [place]   Pt aware of COVID protocols and [facility] guidelines  PV completed over the phone. Pt verified name, DOB, address and insurance during PV today.  Pt mailed instruction packet with copy of consent form to read and not return, and instructions.  Pt encouraged to call with questions or issues.  If pt has My chart, procedure instructions sent via My Chart

## 2021-06-22 ENCOUNTER — Other Ambulatory Visit: Payer: Self-pay | Admitting: Internal Medicine

## 2021-06-25 ENCOUNTER — Ambulatory Visit (AMBULATORY_SURGERY_CENTER): Payer: 59 | Admitting: Gastroenterology

## 2021-06-25 ENCOUNTER — Other Ambulatory Visit: Payer: Self-pay

## 2021-06-25 ENCOUNTER — Encounter: Payer: Self-pay | Admitting: Gastroenterology

## 2021-06-25 VITALS — BP 102/63 | HR 77 | Temp 97.5°F | Resp 12 | Ht 73.0 in | Wt 225.0 lb

## 2021-06-25 DIAGNOSIS — Z86010 Personal history of colonic polyps: Secondary | ICD-10-CM | POA: Diagnosis not present

## 2021-06-25 MED ORDER — SODIUM CHLORIDE 0.9 % IV SOLN: 500.0000 mL | Freq: Once | INTRAVENOUS | Status: DC

## 2021-06-25 NOTE — Op Note (Signed)
[facility] Patient Name: Colin Wood Procedure Date: 06/25/2021 7:59 AM MRN: [id number] Endoscopist: Mallie Mussel L. Loletha Carrow , MD Age: 61 Referring MD:  Date of Birth: [birth year] Gender: Male Account #: [id number] Procedure:                Colonoscopy Indications:              Surveillance: Personal history of adenomatous                            polyps on last colonoscopy 3 years ago                           36mm rectal TA Jan 2020 no polyps 2007 Medicines:                Monitored Anesthesia Care Procedure:                Pre-Anesthesia Assessment:                           - Prior to the procedure, a History and Physical                            was performed, and patient medications and                            allergies were reviewed. The patient's tolerance of                            previous anesthesia was also reviewed. The risks                            and benefits of the procedure and the sedation                            options and risks were discussed with the patient.                            All questions were answered, and informed consent                            was obtained. Prior Anticoagulants: The patient has                            taken no previous anticoagulant or antiplatelet                            agents. ASA Grade Assessment: III - A patient with                            severe systemic disease. After reviewing the risks                            and benefits, the patient was deemed in  satisfactory condition to undergo the procedure.                           After obtaining informed consent, the colonoscope                            was passed under direct vision. Throughout the                            procedure, the patient's blood pressure, pulse, and                            oxygen saturations were monitored continuously. The                            Olympus CF-HQ190L ([id number])  Colonoscope was                            introduced through the anus and advanced to the the                            terminal ileum, with identification of the                            appendiceal orifice and IC valve. The colonoscopy                            was performed without difficulty. The patient                            tolerated the procedure well. The quality of the                            bowel preparation was good after lavage (scattered                            fibrous debris). The terminal ileum, ileocecal                            valve, appendiceal orifice, and rectum were                            photographed. The bowel preparation used was SUPREP. Scope In: 8:06:23 AM Scope Out: 8:21:22 AM Scope Withdrawal Time: 0 hours 11 minutes 17 seconds  Total Procedure Duration: 0 hours 14 minutes 59 seconds  Findings:                 The perianal and digital rectal examinations were                            normal.                           The terminal ileum appeared normal.  Repeat examination of right colon under NBI                            performed.                           Anal papilla(e) were hypertrophied.                           The exam was otherwise without abnormality on                            direct and retroflexion views. Complications:            No immediate complications. Estimated Blood Loss:     Estimated blood loss: none. Impression:               - The examined portion of the ileum was normal.                           - Anal papilla(e) were hypertrophied.                           - The examination was otherwise normal on direct                            and retroflexion views.                           - No specimens collected. Recommendation:           - Patient has a contact number available for                            emergencies. The signs and symptoms of potential                             delayed complications were discussed with the                            patient. Return to normal activities tomorrow.                            Written discharge instructions were provided to the                            patient.                           - Resume previous diet.                           - Continue present medications.                           - Repeat colonoscopy in 5 years for surveillance.(  For next exam, ducolax 10mg  before evening prep                            dose, and consume more water with prep.) Mallie Mussel L. Loletha Carrow, MD 06/25/2021 8:26:25 AM This report has been signed electronically.

## 2021-06-25 NOTE — Patient Instructions (Signed)
Your next colonoscopy should occur in 5 years.    You may resume your previous diet and medication schedule.  Thank you for allowing [place] to care for you today!!!   YOU HAD AN ENDOSCOPIC PROCEDURE TODAY AT [place]:   Refer to the procedure report that was given to you for any specific questions about what was found during the examination.  If the procedure report does not answer your questions, please call your gastroenterologist to clarify.  If you requested that your care partner not be given the details of your procedure findings, then the procedure report has been included in a sealed envelope for you to review at your convenience later.  YOU SHOULD EXPECT: Some feelings of bloating in the abdomen. Passage of more gas than usual.  Walking can help get rid of the air that was put into your GI tract during the procedure and reduce the bloating. If you had a lower endoscopy (such as a colonoscopy or flexible sigmoidoscopy) you may notice spotting of blood in your stool or on the toilet paper. If you underwent a bowel prep for your procedure, you may not have a normal bowel movement for a few days.  Please Note:  You might notice some irritation and congestion in your nose or some drainage.  This is from the oxygen used during your procedure.  There is no need for concern and it should clear up in a day or so.  SYMPTOMS TO REPORT IMMEDIATELY:  Following lower endoscopy (colonoscopy or flexible sigmoidoscopy):  Excessive amounts of blood in the stool  Significant tenderness or worsening of abdominal pains  Swelling of the abdomen that is new, acute  Fever of 100F or higher  For urgent or emergent issues, a gastroenterologist can be reached at any hour by calling [phone]. Do not use MyChart messaging for urgent concerns.    DIET:  We do recommend a small meal at first, but then you may proceed to your regular diet.  Drink plenty of fluids but you should avoid  alcoholic beverages for 24 hours.  ACTIVITY:  You should plan to take it easy for the rest of today and you should NOT DRIVE or use heavy machinery until tomorrow (because of the sedation medicines used during the test).    FOLLOW UP: Our staff will call the number listed on your records Monday morning between 7:15 am and 8:15 am following your procedure to check on you and address any questions or concerns that you may have regarding the information given to you following your procedure. If we do not reach you, we will leave a message.  We will attempt to reach you two times.  During this call, we will ask if you have developed any symptoms of COVID 19. If you develop any symptoms (ie: fever, flu-like symptoms, shortness of breath, cough etc.) before then, please call [phone].  If you test positive for Covid 19 in the 2 weeks post procedure, please call and report this information to [place].    If any biopsies were taken you will be contacted by phone or by letter within the next 1-3 weeks.  Please call us at [phone] if you have not heard about the biopsies in 3 weeks.    SIGNATURES/CONFIDENTIALITY: You and/or your care partner have signed paperwork which will be entered into your electronic medical record.  These signatures attest to the fact that that the information above on your After Visit Summary has been reviewed  and is understood.  Full responsibility of the confidentiality of this discharge information lies with you and/or your care-partner.

## 2021-06-25 NOTE — Progress Notes (Signed)
Pt's states no medical or surgical changes since previsit or office visit. 

## 2021-06-25 NOTE — Progress Notes (Signed)
Pt non-responsive, VVS, Report to RN  °

## 2021-06-25 NOTE — Progress Notes (Signed)
History and Physical:  This patient presents for endoscopic testing for: Encounter Diagnosis  Name Primary?   Personal history of colonic polyps Yes    3mm rectal TA Jan 2020  ROS: Patient denies chest pain or cough   Past Medical History: Past Medical History:  Diagnosis Date   Anxiety    on meds   Atrial flutter ([place])    typical appearing; s/p ablation 12-2012   Constipation    Depression    hx of   Diabetes mellitus    TYPE 2-on meds   Fatty liver    GERD (gastroesophageal reflux disease)    Gout    Hyperlipidemia    Hypothyroidism    on meds   Leg edema    Obesity    Persistent atrial fibrillation (HCC)    PVI 12/2012   Prediabetes    Rhinitis    vasomotor   S/P Minimally invasive maze operation for atrial fibrillation 03/12/2016   Complete bilateral atrial lesion set using cryothermy and bipolar radiofrequency ablation with clipping of LA appendage via right mini thoracotomy approach   S/P patent foramen ovale closure 03/12/2016   Sleep apnea    mild, now treated with CPAP since ~9-12     Past Surgical History: Past Surgical History:  Procedure Laterality Date   ABLATION OF DYSRHYTHMIC FOCUS  01/05/2013   PVI and flutter ablation by [name]   ATRIAL FIBRILLATION ABLATION N/A 01/05/2013   Procedure: ATRIAL FIBRILLATION ABLATION;  Surgeon: Scheirer Grayer, MD;  Location: [facility] CATH LAB;  Service: Cardiovascular;  Laterality: N/A;   CARDIAC CATHETERIZATION N/A 01/12/2016   Procedure: Right/Left Heart Cath and Coronary Angiography;  Surgeon: [name], MD;  Location: [place] CV LAB;  Service: Cardiovascular;  Laterality: N/A;   CARDIOVERSION  03/01/2011   CLIPPING OF ATRIAL APPENDAGE Left 03/12/2016   Procedure: CLIPPING OF ATRIAL APPENDAGE;  Surgeon: [name], MD;  Location: [place];  Service: Open Heart Surgery;  Laterality: Left;   COLONOSCOPY  2020   HD-suprep(good)   MINIMALLY INVASIVE MAZE PROCEDURE N/A 03/12/2016   Procedure: MINIMALLY  INVASIVE MAZE PROCEDURE;  Surgeon: [name], MD;  Location: [place];  Service: Open Heart Surgery;  Laterality: N/A;   NASAL SINUS SURGERY     TEE WITHOUT CARDIOVERSION N/A 01/04/2013   Procedure: TRANSESOPHAGEAL ECHOCARDIOGRAM (TEE);  Surgeon: [name], MD;  Location: [facility] ENDOSCOPY;  Service: Cardiovascular;  Laterality: N/A;   TEE WITHOUT CARDIOVERSION N/A 03/12/2016   Procedure: TRANSESOPHAGEAL ECHOCARDIOGRAM (TEE);  Surgeon: [name], MD;  Location: [place];  Service: Open Heart Surgery;  Laterality: N/A;   TONSILLECTOMY     VASECTOMY     WISDOM TOOTH EXTRACTION      Allergies: No Known Allergies  Outpatient Meds: Current Outpatient Medications  Medication Sig Dispense Refill   allopurinol (ZYLOPRIM) 100 MG tablet TAKE 2 TABLETS(200 MG) BY MOUTH DAILY 180 tablet 1   Ascorbic Acid (VITAMIN C PO) Take 2 tablets by mouth 2 (two) times daily.     atorvastatin (LIPITOR) 10 MG tablet TAKE 1 TABLET(10 MG) BY MOUTH DAILY 90 tablet 1   azelastine (ASTELIN) 0.1 % nasal spray Place 2 sprays into both nostrils at bedtime as needed for rhinitis or allergies. 30 mL 3   Cholecalciferol 50 MCG (2000 UT) CAPS Take 2 capsules by mouth daily at 6 (six) AM.     Continuous Blood Gluc Receiver (DEXCOM G6 RECEIVER) DEVI Use to check blood sugar daily 1 each 0  Continuous Blood Gluc Sensor (DEXCOM G6 SENSOR) MISC Change sensor every 10 days 3 each 3   Continuous Blood Gluc Transmit (DEXCOM G6 TRANSMITTER) MISC Change every 3 months 1 each 3   Dulaglutide (TRULICITY) 4.5 YB/6.3SL SOPN INJECT 4.5MG  AS DIRECTED ONCE A WEEK 6 mL 2   empagliflozin (JARDIANCE) 25 MG TABS tablet Take 1 tablet (25 mg total) by mouth daily. 90 tablet 1   famotidine (PEPCID) 20 MG tablet Take 20 mg by mouth daily.     fenofibrate 160 MG tablet Take 1 tablet (160 mg total) by mouth daily. 90 tablet 2   fluticasone (FLONASE) 50 MCG/ACT nasal spray Place 2 sprays into both nostrils daily. (Patient taking differently: Place  2 sprays into both nostrils daily as needed.) 16 mL 0   glucose blood (CONTOUR NEXT TEST) test strip CHECK BLOOD SUGAR THREE TIMES DAILY AS DIRECTED 300 strip 3   icosapent Ethyl (VASCEPA) 1 g capsule TAKE 2 CAPSULES(2 GRAMS) BY MOUTH TWICE DAILY 360 capsule 3   levothyroxine (SYNTHROID) 137 MCG tablet TAKE 1 TABLET(137 MCG) BY MOUTH DAILY BEFORE BREAKFAST 90 tablet 1   lisinopril (ZESTRIL) 5 MG tablet Take 1 tablet (5 mg total) by mouth daily. 90 tablet 3   Magnesium 500 MG TABS Take 1 tablet by mouth daily in the afternoon.     metFORMIN (GLUCOPHAGE) 1000 MG tablet Take 1 tablet (1,000 mg total) by mouth 2 (two) times daily with a meal. 180 tablet 1   Microlet Lancets MISC USE TO CHECK BLOOD SUGAR THREE TIMES DAILY 100 each 3   Multiple Vitamin (MULTIVITAMIN) tablet Take 1 tablet by mouth daily.     pantoprazole (PROTONIX) 40 MG tablet Take 1 tablet (40 mg total) by mouth daily. 90 tablet 3   Sennosides-Docusate Sodium (SENNA-DOCUSATE SODIUM PO) Take 1 tablet by mouth daily.     Zinc 50 MG CAPS Take 1 capsule by mouth daily at 6 (six) AM.     Current Facility-Administered Medications  Medication Dose Route Frequency Provider Last Rate Last Admin   0.9 %  sodium chloride infusion  500 mL Intravenous Once Doran Stabler, MD          ___________________________________________________________________ Objective   Exam:  BP 107/68    Pulse 81    Temp (!) 97.5 F (36.4 C)    Ht 6\' 1"  (1.854 m)    Wt 225 lb (102.1 kg)    SpO2 98%    BMI 29.69 kg/m   CV: RRR without murmur, S1/S2 Resp: clear to auscultation bilaterally, normal RR and effort noted GI: soft, no tenderness, with active bowel sounds.Dexcom monitor RLQ   Assessment: Encounter Diagnosis  Name Primary?   Personal history of colonic polyps Yes     Plan: Colonoscopy  The benefits and risks of the planned procedure were described in detail with the patient or (when appropriate) their health care proxy.  Risks were  outlined as including, but not limited to, bleeding, infection, perforation, adverse medication reaction leading to cardiac or pulmonary decompensation, pancreatitis (if ERCP).  The limitation of incomplete mucosal visualization was also discussed.  No guarantees or warranties were given.    The patient is appropriate for an endoscopic procedure in the ambulatory setting.   - Wilfrid Lund, MD

## 2021-06-29 ENCOUNTER — Telehealth: Payer: Self-pay

## 2021-06-29 NOTE — Telephone Encounter (Signed)
°  Follow up Call-  Call back number 06/25/2021  Post procedure Call Back phone  # [phone]  Permission to leave phone message Yes  Some recent data might be hidden     Patient questions:  Do you have a fever, pain , or abdominal swelling? No. Pain Score  0 *  Have you tolerated food without any problems? Yes.    Have you been able to return to your normal activities? Yes.    Do you have any questions about your discharge instructions: Diet   No. Medications  No. Follow up visit  No.  Do you have questions or concerns about your Care? No.  Actions: * If pain score is 4 or above: No action needed, pain <4.

## 2021-07-07 ENCOUNTER — Other Ambulatory Visit: Payer: Self-pay | Admitting: Internal Medicine

## 2021-07-16 ENCOUNTER — Encounter: Payer: Self-pay | Admitting: Endocrinology

## 2021-07-17 ENCOUNTER — Other Ambulatory Visit: Payer: Self-pay

## 2021-07-17 DIAGNOSIS — E1169 Type 2 diabetes mellitus with other specified complication: Secondary | ICD-10-CM

## 2021-07-17 DIAGNOSIS — E669 Obesity, unspecified: Secondary | ICD-10-CM

## 2021-07-17 MED ORDER — LEVOTHYROXINE SODIUM 137 MCG PO TABS: ORAL_TABLET | ORAL | 1 refills | Status: DC

## 2021-07-18 ENCOUNTER — Encounter: Payer: Self-pay | Admitting: Endocrinology

## 2021-07-21 NOTE — Telephone Encounter (Signed)
Colin Wood picked up Dexcom G6 sensor 07/21/21 @ 1015 AM - log noted as well.

## 2021-07-31 ENCOUNTER — Other Ambulatory Visit (HOSPITAL_COMMUNITY): Payer: Self-pay

## 2021-08-05 ENCOUNTER — Encounter: Payer: Self-pay | Admitting: Endocrinology

## 2021-08-07 ENCOUNTER — Other Ambulatory Visit: Payer: Self-pay

## 2021-08-07 ENCOUNTER — Other Ambulatory Visit (INDEPENDENT_AMBULATORY_CARE_PROVIDER_SITE_OTHER): Payer: 59

## 2021-08-07 DIAGNOSIS — E669 Obesity, unspecified: Secondary | ICD-10-CM

## 2021-08-07 DIAGNOSIS — E559 Vitamin D deficiency, unspecified: Secondary | ICD-10-CM

## 2021-08-07 DIAGNOSIS — E1169 Type 2 diabetes mellitus with other specified complication: Secondary | ICD-10-CM

## 2021-08-07 DIAGNOSIS — E063 Autoimmune thyroiditis: Secondary | ICD-10-CM

## 2021-08-07 NOTE — Telephone Encounter (Signed)
Patient came in form labs. Was given 2 samples of 1.'5mg'$  Trulicity ok'd by Dr Dwyane Dee. Sending in '3mg'$  Trulicity to pharmacy ?

## 2021-08-08 LAB — COMPREHENSIVE METABOLIC PANEL
ALT: 22 IU/L (ref 0–44)
AST: 18 IU/L (ref 0–40)
Albumin/Globulin Ratio: 2.4 — ABNORMAL HIGH (ref 1.2–2.2)
Albumin: 5.1 g/dL — ABNORMAL HIGH (ref 3.8–4.8)
Alkaline Phosphatase: 58 IU/L (ref 44–121)
BUN/Creatinine Ratio: 21 (ref 10–24)
BUN: 23 mg/dL (ref 8–27)
Bilirubin Total: 0.5 mg/dL (ref 0.0–1.2)
CO2: 24 mmol/L (ref 20–29)
Calcium: 10.3 mg/dL — ABNORMAL HIGH (ref 8.6–10.2)
Chloride: 96 mmol/L (ref 96–106)
Creatinine, Ser: 1.08 mg/dL (ref 0.76–1.27)
Globulin, Total: 2.1 g/dL (ref 1.5–4.5)
Glucose: 123 mg/dL — ABNORMAL HIGH (ref 70–99)
Potassium: 4.4 mmol/L (ref 3.5–5.2)
Sodium: 140 mmol/L (ref 134–144)
Total Protein: 7.2 g/dL (ref 6.0–8.5)
eGFR: 78 mL/min/{1.73_m2} (ref >59)

## 2021-08-08 LAB — VITAMIN D 25 HYDROXY (VIT D DEFICIENCY, FRACTURES): Vit D, 25-Hydroxy: 44.7 ng/mL (ref 30.0–100.0)

## 2021-08-08 LAB — LIPID PANEL
Chol/HDL Ratio: 3.7 ratio (ref 0.0–5.0)
Cholesterol, Total: 172 mg/dL (ref 100–199)
HDL: 47 mg/dL (ref >39)
LDL Chol Calc (NIH): 99 mg/dL (ref 0–99)
Triglycerides: 151 mg/dL — ABNORMAL HIGH (ref 0–149)
VLDL Cholesterol Cal: 26 mg/dL (ref 5–40)

## 2021-08-08 LAB — MICROALBUMIN / CREATININE URINE RATIO
Creatinine, Urine: 84.6 mg/dL
Microalb/Creat Ratio: <4 mg/g creat (ref 0–29)
Microalbumin, Urine: <3 ug/mL

## 2021-08-08 LAB — HEMOGLOBIN A1C
Est. average glucose Bld gHb Est-mCnc: 126 mg/dL
Hgb A1c MFr Bld: 6 % — ABNORMAL HIGH (ref 4.8–5.6)

## 2021-08-08 LAB — TSH: TSH: 2.1 u[IU]/mL (ref 0.450–4.500)

## 2021-08-12 ENCOUNTER — Encounter: Payer: Self-pay | Admitting: Endocrinology

## 2021-08-13 ENCOUNTER — Encounter: Payer: Self-pay | Admitting: Endocrinology

## 2021-08-13 ENCOUNTER — Ambulatory Visit: Payer: 59 | Admitting: Endocrinology

## 2021-08-13 ENCOUNTER — Other Ambulatory Visit: Payer: Self-pay

## 2021-08-13 VITALS — BP 122/70 | HR 78 | Ht 73.0 in | Wt 231.4 lb

## 2021-08-13 DIAGNOSIS — E790 Hyperuricemia without signs of inflammatory arthritis and tophaceous disease: Secondary | ICD-10-CM | POA: Diagnosis not present

## 2021-08-13 DIAGNOSIS — E063 Autoimmune thyroiditis: Secondary | ICD-10-CM | POA: Diagnosis not present

## 2021-08-13 DIAGNOSIS — E782 Mixed hyperlipidemia: Secondary | ICD-10-CM

## 2021-08-13 DIAGNOSIS — E1169 Type 2 diabetes mellitus with other specified complication: Secondary | ICD-10-CM | POA: Diagnosis not present

## 2021-08-13 DIAGNOSIS — E669 Obesity, unspecified: Secondary | ICD-10-CM

## 2021-08-13 MED ORDER — DEXCOM G7 SENSOR MISC
1.0000 | 3 refills | Status: DC
Start: 2021-08-13 — End: 2021-10-05

## 2021-08-13 NOTE — Progress Notes (Signed)
Patient ID: Colin Wood, male   DOB: 03-04-1961, 61 y.o.   MRN: [id number] ? ?       ? ? ?Reason for Appointment: Follow-up for various problems ? ?Referring physician: Larose Kells ? ?History of Present Illness:  ?        ?Diagnosis: Type 2 diabetes mellitus, date of diagnosis: ?  2011       ? ?Past history: He was not having any symptoms at the time of diagnosis and not clear what his initial blood sugars were ?He was started on metformin initially and had fairly good control ?Subsequently with higher sugars he was given Amaryl also in addition which has been continued.  A1c was 10.6% in 2012 ?Subsequently in 2014 his A1c was down to 6.5 but he had a regular follow-up subsequently ?Because of an A1c of 8.1 in 02/2014 he was given Tradjenta in addition to the above drugs but this was not effective ? He was referred here because of a high A1c of 7.9 in 07/2014 ? ?Recent history:  ? ?Oral hypoglycemic drugs the patient is taking are: metformin 1 g twice a day, Trulicity 4.5 mg weekly, Jardiance 25 mg daily     ? ?A1c is stable at 6.0, generally about the same ? ?Current management, blood sugar values and problems identified: ?He has been using the Dexcom sensor with some benefit in helping him observe when his blood sugars go up  ?He thinks that he has higher readings with the sensor compared to his fingerstick at least for a day after he starts the sensor  ?However on the day he had his labs drawn has not blood sugar was slightly lower on the sensor compared to the lab glucose  ?Generally he has been trying to do a little better with the diet except on occasion when he may have more sweets  ?He has not had any difficulty getting his Trulicity prescription filled he usually ?Only has skipped 1 dose ?He says that he has been generally trying to get into a walking program walking a 15-minute mile for couple miles at times ?Weight is up 6 pounds recently ? ?       ?Side effects from medications have been:None  ?Compliance  with the medical regimen: Fair  ? ?.      ?Interpretation of his Dexcom G6 sensor download shows the following ? ?HIGHEST blood sugars overall are late in the evening or midmorning and lowest blood sugars are before lunch ?OVERNIGHT blood sugars are mildly increased overall averaging in the 140-160 range and appearing to be higher early morning between 4-7 AM ?POSTPRANDIAL readings are somewhat variable with episodes of significant hyperglycemia but 2 or 3 times a week after every meal and relatively more often after dinner in the last week ?No hypoglycemia ? ?Pre-meal blood sugars are generally higher before dinner compared to lunchtime  ?fasting readings are generally mildly increased with some dawn phenomenon present ? ?CGM use % of time 93  ?2-week average/GV 155  ?Time in range        81%  ?% Time Above 180 18  ?% Time above 250   ?% Time Below 70 0  ? ?  ?PRE-MEAL Fasting Lunch Dinner Bedtime Overall  ?Glucose range:       ?Averages: 147 124 146 166   ? ?POST-MEAL PC Breakfast PC Lunch PC Dinner  ?Glucose range:     ?Averages: 169  166 168  ? ? ? ?Blood Glucose readings and averages  previously from download: ? ? ?PRE-MEAL Fasting Lunch Dinner Bedtime Overall  ?Glucose range:  116-157    105-202  ?Mean/median: 086   761 950  ? ?POST-MEAL PC Breakfast PC Lunch PC Dinner  ?Glucose range:  202   ?Mean/median:     ? ? ? ?Self-care: Meal planning is fair, sometimes getting biscuits in the morning ?         ?Dietician visit, most recent: none, previously had gone to a class         ? ?Weight history: Previous range 260-280 ? ?Wt Readings from Last 3 Encounters:  ?08/13/21 231 lb 6.4 oz (105 kg)  ?06/25/21 225 lb (102.1 kg)  ?06/18/21 225 lb (102.1 kg)  ? ? ?Glycemic control: ?  ?Lab Results  ?Component Value Date  ? HGBA1C 6.0 (H) 08/07/2021  ? HGBA1C 6.1 (H) 04/03/2021  ? HGBA1C 6.0 (H) 11/28/2020  ? ?Lab Results  ?Component Value Date  ? [place] 99 08/07/2021  ? CREATININE 1.08 08/07/2021  ? ? ? ?Allergies as of  08/13/2021   ?No Known Allergies ?  ? ?  ?Medication List  ?  ? ?  ? Accurate as of August 13, 2021  8:35 AM. If you have any questions, ask your nurse or doctor.  ?  ?  ? ?  ? ?allopurinol 100 MG tablet ?Commonly known as: ZYLOPRIM ?TAKE 2 TABLETS(200 MG) BY MOUTH DAILY ?  ?atorvastatin 10 MG tablet ?Commonly known as: LIPITOR ?TAKE 1 TABLET(10 MG) BY MOUTH DAILY ?  ?azelastine 0.1 % nasal spray ?Commonly known as: ASTELIN ?Place 2 sprays into both nostrils at bedtime as needed for rhinitis or allergies. ?  ?Cholecalciferol 50 MCG (2000 UT) Caps ?Take 2 capsules by mouth daily at 6 (six) AM. ?  ?Contour Next Test test strip ?Generic drug: glucose blood ?CHECK BLOOD SUGAR THREE TIMES DAILY AS DIRECTED ?  ?Dexcom G6 Receiver Devi ?Use to check blood sugar daily ?  ?Dexcom G6 Sensor Misc ?Change sensor every 10 days ?  ?Dexcom G6 Transmitter Misc ?Change every 3 months ?  ?empagliflozin 25 MG Tabs tablet ?Commonly known as: Jardiance ?Take 1 tablet (25 mg total) by mouth daily. ?  ?famotidine 20 MG tablet ?Commonly known as: PEPCID ?TAKE 1 TABLET(20 MG) BY MOUTH AT BEDTIME ?  ?fenofibrate 160 MG tablet ?Take 1 tablet (160 mg total) by mouth daily. ?  ?fluticasone 50 MCG/ACT nasal spray ?Commonly known as: FLONASE ?Place 2 sprays into both nostrils daily. ?What changed:  ?when to take this ?reasons to take this ?  ?icosapent Ethyl 1 g capsule ?Commonly known as: VASCEPA ?TAKE 2 CAPSULES(2 GRAMS) BY MOUTH TWICE DAILY ?  ?levothyroxine 137 MCG tablet ?Commonly known as: SYNTHROID ?Take 1 tablet by mouth daily before breakfast ?  ?lisinopril 5 MG tablet ?Commonly known as: ZESTRIL ?Take 1 tablet (5 mg total) by mouth daily. ?  ?Magnesium 500 MG Tabs ?Take 1 tablet by mouth daily in the afternoon. ?  ?metFORMIN 1000 MG tablet ?Commonly known as: GLUCOPHAGE ?Take 1 tablet (1,000 mg total) by mouth 2 (two) times daily with a meal. ?  ?Microlet Lancets Misc ?USE TO CHECK BLOOD SUGAR THREE TIMES DAILY ?  ?multivitamin  tablet ?Take 1 tablet by mouth daily. ?  ?pantoprazole 40 MG tablet ?Commonly known as: PROTONIX ?Take 1 tablet (40 mg total) by mouth daily. ?  ?SENNA-DOCUSATE SODIUM PO ?Take 1 tablet by mouth daily. ?  ?Trulicity 4.5 DT/2.6ZT Sopn ?Generic drug: Dulaglutide ?INJECT 4.'5MG'$  AS DIRECTED  ONCE A WEEK ?  ?VITAMIN C PO ?Take 2 tablets by mouth 2 (two) times daily. ?  ?Zinc 50 MG Caps ?Take 1 capsule by mouth daily at 6 (six) AM. ?  ? ?  ? ? ?Allergies:  ?No Known Allergies ? ?Past Medical History:  ?Diagnosis Date  ? Anxiety   ? on meds  ? Atrial flutter ([place])   ? typical appearing; s/p ablation 12-2012  ? Constipation   ? Depression   ? hx of  ? Diabetes mellitus   ? TYPE 2-on meds  ? Fatty liver   ? GERD (gastroesophageal reflux disease)   ? Gout   ? Hyperlipidemia   ? Hypothyroidism   ? on meds  ? Leg edema   ? Obesity   ? Persistent atrial fibrillation ([place])   ? PVI 12/2012  ? Prediabetes   ? Rhinitis   ? vasomotor  ? S/P Minimally invasive maze operation for atrial fibrillation 03/12/2016  ? Complete bilateral atrial lesion set using cryothermy and bipolar radiofrequency ablation with clipping of LA appendage via right mini thoracotomy approach  ? S/P patent foramen ovale closure 03/12/2016  ? Sleep apnea   ? mild, now treated with CPAP since ~9-12  ? ? ?Past Surgical History:  ?Procedure Laterality Date  ? ABLATION OF DYSRHYTHMIC FOCUS  01/05/2013  ? PVI and flutter ablation by [name]  ? ATRIAL FIBRILLATION ABLATION N/A 01/05/2013  ? Procedure: ATRIAL FIBRILLATION ABLATION;  Surgeon: Gingrich Grayer, MD;  Location: [facility] CATH LAB;  Service: Cardiovascular;  Laterality: N/A;  ? CARDIAC CATHETERIZATION N/A 01/12/2016  ? Procedure: Right/Left Heart Cath and Coronary Angiography;  Surgeon: [name], MD;  Location: [place] CV LAB;  Service: Cardiovascular;  Laterality: N/A;  ? CARDIOVERSION  03/01/2011  ? CLIPPING OF ATRIAL APPENDAGE Left 03/12/2016  ? Procedure: CLIPPING OF ATRIAL APPENDAGE;  Surgeon: [name], MD;  Location: [place];  Service: Open Heart Surgery;  Laterality: Left;  ? COLONOSCOPY  2020  ? HD-suprep(good)  ? MINIMALLY INVASIVE MAZE PROCEDURE N/A 03/12/2016  ? Procedure: MINIMALLY INVASIVE MAZE PROCEDU

## 2021-08-13 NOTE — Patient Instructions (Signed)
Check blood sugars on waking up   ? ?Also check blood sugars about 2 hours after meals and do this after different meals by rotation ? ?Recommended blood sugar levels on waking up are 90-130 and about 2 hours after meal is 130-160 ?  ? ?

## 2021-08-14 ENCOUNTER — Other Ambulatory Visit (HOSPITAL_COMMUNITY): Payer: Self-pay

## 2021-08-14 ENCOUNTER — Ambulatory Visit: Payer: 59 | Admitting: Endocrinology

## 2021-08-17 ENCOUNTER — Encounter: Payer: Self-pay | Admitting: Endocrinology

## 2021-08-25 LAB — HM DIABETES EYE EXAM

## 2021-08-29 ENCOUNTER — Other Ambulatory Visit: Payer: Self-pay | Admitting: Endocrinology

## 2021-08-29 DIAGNOSIS — E1169 Type 2 diabetes mellitus with other specified complication: Secondary | ICD-10-CM

## 2021-08-31 ENCOUNTER — Other Ambulatory Visit (HOSPITAL_COMMUNITY): Payer: Self-pay

## 2021-08-31 ENCOUNTER — Telehealth: Payer: Self-pay | Admitting: Pharmacy Technician

## 2021-08-31 NOTE — Telephone Encounter (Signed)
Patient Advocate Encounter ? ?Received notification from [place] that prior authorization for Icosapent Ethyl 1GM is required. ?  ?PA submitted on 04.3.23 ?Key [id number] ?Status is pending ?  ?[facility] will continue to follow ? ?Karo Rog R Apolonia Ellwood, CPhT ?Patient Advocate ?[facility] ?Phone: [phone] ?Fax:  [phone] ? ?

## 2021-09-01 ENCOUNTER — Other Ambulatory Visit (HOSPITAL_COMMUNITY): Payer: Self-pay

## 2021-09-01 NOTE — Telephone Encounter (Signed)
Received notification from [facility] regarding a prior authorization for ICOSAPENT 1G. Authorization has been APPROVED from 4.3.23 to 4.3.24.  ? ? ?Authorization # [id number] ? ? ? ?

## 2021-09-03 ENCOUNTER — Other Ambulatory Visit: Payer: Self-pay | Admitting: Internal Medicine

## 2021-09-09 ENCOUNTER — Ambulatory Visit (INDEPENDENT_AMBULATORY_CARE_PROVIDER_SITE_OTHER): Payer: 59 | Admitting: Internal Medicine

## 2021-09-09 ENCOUNTER — Encounter: Payer: Self-pay | Admitting: Internal Medicine

## 2021-09-09 VITALS — BP 126/60 | HR 81 | Temp 97.9°F | Resp 18 | Ht 73.0 in | Wt 231.2 lb

## 2021-09-09 DIAGNOSIS — Z0000 Encounter for general adult medical examination without abnormal findings: Secondary | ICD-10-CM | POA: Diagnosis not present

## 2021-09-09 DIAGNOSIS — Z23 Encounter for immunization: Secondary | ICD-10-CM

## 2021-09-09 NOTE — Patient Instructions (Addendum)
Per our records you are due for your diabetic eye exam. Please contact your eye doctor to schedule an appointment. Please have them send copies of your office visit notes to [place]. Our fax number is [phone]. If you need a referral to an eye doctor please let us know. ? ?Please read the information about the advance care planning ? ? ?GO TO THE LAB : Get the blood work   ? ? ?[place], [place] ?Come back for   your second shingles shot anytime between 2 and 6 months ? ?Come back for a physical exam in 1 year ?

## 2021-09-09 NOTE — Progress Notes (Signed)
? ?Subjective:  ? ? Patient ID: Colin Wood, male    DOB: [birth year], 61 y.o.   MRN: [id number] ? ?DOS:  09/09/2021 ?Type of visit - description: cpx ? ?Here for CPX ?Since the last visit he is doing well. ?Has no major concerns. ?From time to time he has heartburn, typically diet related. ? ?Wt Readings from Last 3 Encounters:  ?09/09/21 231 lb 4 oz (104.9 kg)  ?08/13/21 231 lb 6.4 oz (105 kg)  ?06/25/21 225 lb (102.1 kg)  ? ?Review of Systems ? ?Other than above, a 14 point review of systems is negative  ? ?  ? ? ?Past Medical History:  ?Diagnosis Date  ? Anxiety   ? on meds  ? Atrial flutter ([place])   ? typical appearing; s/p ablation 12-2012  ? Constipation   ? Depression   ? hx of  ? Diabetes mellitus   ? TYPE 2-on meds  ? Fatty liver   ? GERD (gastroesophageal reflux disease)   ? Gout   ? Hyperlipidemia   ? Hypothyroidism   ? on meds  ? Leg edema   ? Obesity   ? Persistent atrial fibrillation ([place])   ? PVI 12/2012  ? Prediabetes   ? Rhinitis   ? vasomotor  ? S/P Minimally invasive maze operation for atrial fibrillation 03/12/2016  ? Complete bilateral atrial lesion set using cryothermy and bipolar radiofrequency ablation with clipping of LA appendage via right mini thoracotomy approach  ? S/P patent foramen ovale closure 03/12/2016  ? Sleep apnea   ? mild, now treated with CPAP since ~9-12  ? ? ?Past Surgical History:  ?Procedure Laterality Date  ? ABLATION OF DYSRHYTHMIC FOCUS  01/05/2013  ? PVI and flutter ablation by [name]  ? ATRIAL FIBRILLATION ABLATION N/A 01/05/2013  ? Procedure: ATRIAL FIBRILLATION ABLATION;  Surgeon: Lapaglia Grayer, MD;  [facility] C[facility] LAB;  Service: Cardiovascular;  Laterality: N/A;  ? CARDIAC CATHETERIZATION N/A 01/12/2016  ? Procedure: Right/Left Heart Cath and Coronary Angiography;  Surgeon: [name], MD;  Location: [place] CV LAB;  Service: Cardiovascular;  Laterality: N/A;  ? CARDIOVERSION  03/01/2011  ? CLIPPING OF ATRIAL APPENDAGE Left 03/12/2016  ? Procedure:  CLIPPING OF ATRIAL APPENDAGE;  Surgeon: [name], MD;  Location: [place];  Service: Open Heart Surgery;  Laterality: Left;  ? COLONOSCOPY  2020  ? HD-suprep(good)  ? MINIMALLY INVASIVE MAZE PROCEDURE N/A 03/12/2016  ? Procedure: MINIMALLY INVASIVE MAZE PROCEDURE;  Surgeon: [name], MD;  Location: [place];  Service: Open Heart Surgery;  Laterality: N/A;  ? NASAL SINUS SURGERY    ? TEE WITHOUT CARDIOVERSION N/A 01/04/2013  ? Procedure: TRANSESOPHAGEAL ECHOCARDIOGRAM (TEE);  Surgeon: [name], MD;  Location: [facility] ENDOSCOPY;  Service: Cardiovascular;  Laterality: N/A;  ? TEE WITHOUT CARDIOVERSION N/A 03/12/2016  ? Procedure: TRANSESOPHAGEAL ECHOCARDIOGRAM (TEE);  Surgeon: [name], MD;  Location: [place];  Service: Open Heart Surgery;  Laterality: N/A;  ? TONSILLECTOMY    ? VASECTOMY    ? WISDOM TOOTH EXTRACTION    ? ?Social History  ? ?Socioeconomic History  ? Marital status: Married  ?  Spouse name: Mardene Celeste  ? Number of children: 2  ? Years of education: Not on file  ? Highest education level: Not on file  ?Occupational History  ? Occupation: [employer]  ?Tobacco Use  ? Smoking status: Never  ? Smokeless tobacco: Never  ?Vaping Use  ? Vaping Use: Never used  ?Substance  and Sexual Activity  ? Alcohol use: No  ? Drug use: No  ? Sexual activity: Not on file  ?Other Topics Concern  ? Not on file  ?Social History Narrative  ? Pt lives in [place] with spouse and 1 child  ? 2 children  1988, 2000 .    ? Works in Engineer, technical sales at [employer].  ? ?Social Determinants of Health  ? ?Financial Resource Strain: Not on file  ?Food Insecurity: Not on file  ?Transportation Needs: Not on file  ?Physical Activity: Not on file  ?Stress: Not on file  ?Social Connections: Not on file  ?Intimate Partner Violence: Not on file  ? ? ?Current Outpatient Medications  ?Medication Instructions  ? allopurinol (ZYLOPRIM) 100 MG tablet TAKE 2 TABLETS(200 MG) BY MOUTH DAILY  ? Ascorbic Acid (VITAMIN C PO) 2 tablets, Oral, 2 times daily  ?  atorvastatin (LIPITOR) 10 MG tablet TAKE 1 TABLET(10 MG) BY MOUTH DAILY  ? azelastine (ASTELIN) 0.1 % nasal spray 2 sprays, Each Nare, At bedtime PRN  ? Cholecalciferol 50 MCG (2000 UT) CAPS 2 capsules, Oral, Daily  ? Continuous Blood Gluc Sensor (DEXCOM G7 SENSOR) MISC 1 Device, Does not apply, As directed, Change sensor every 10 days  ? Dulaglutide (TRULICITY) 4.5 GH/8.2XH SOPN INJECT 4.'5MG'$  AS DIRECTED ONCE A WEEK  ? empagliflozin (JARDIANCE) 25 mg, Oral, Daily  ? famotidine (PEPCID) 20 MG tablet TAKE 1 TABLET(20 MG) BY MOUTH AT BEDTIME  ? fenofibrate 160 mg, Oral, Daily  ? fluticasone (FLONASE) 50 MCG/ACT nasal spray 2 sprays, Each Nare, Daily  ? glucose blood (CONTOUR NEXT TEST) test strip CHECK BLOOD SUGAR THREE TIMES DAILY AS DIRECTED  ? icosapent Ethyl (VASCEPA) 1 g capsule TAKE 2 CAPSULES(2 GRAMS) BY MOUTH TWICE DAILY  ? levothyroxine (SYNTHROID) 137 MCG tablet Take 1 tablet by mouth daily before breakfast  ? lisinopril (ZESTRIL) 5 mg, Oral, Daily  ? Magnesium 500 MG TABS 1 tablet, Oral, Daily  ? metFORMIN (GLUCOPHAGE) 1000 MG tablet TAKE 1 TABLET(1000 MG) BY MOUTH TWICE DAILY WITH A MEAL  ? Microlet Lancets MISC USE TO CHECK BLOOD SUGAR THREE TIMES DAILY  ? Multiple Vitamin (MULTIVITAMIN) tablet 1 tablet, Oral, Daily  ? pantoprazole (PROTONIX) 40 MG tablet TAKE 1 TABLET(40 MG) BY MOUTH DAILY  ? Sennosides-Docusate Sodium (SENNA-DOCUSATE SODIUM PO) 1 tablet, Oral, Daily  ? Zinc 50 MG CAPS 1 capsule, Oral, Daily  ? ? ?   ?Objective:  ? Physical Exam ?BP 126/60 (BP Location: Left Arm, Patient Position: Sitting, Cuff Size: Normal)   Pulse 81   Temp 97.9 ?F (36.6 ?C) (Oral)   Resp 18   Ht '6\' 1"'$  (1.854 m)   Wt 231 lb 4 oz (104.9 kg)   SpO2 98%   BMI 30.51 kg/m?  ?General: ?Well developed, NAD, BMI noted ?Neck: No  thyromegaly  ?HEENT:  ?Normocephalic . Face symmetric, atraumatic ?Lungs:  ?CTA B ?Normal respiratory effort, no intercostal retractions, no accessory muscle use. ?Heart: RRR,  no murmur.   ?Abdomen:  ?Not distended, soft, non-tender. No rebound or rigidity. ?DRE: Normal sphincter tone, no stools, prostate normal ?Lower extremities: no pretibial edema bilaterally  ?Skin: Exposed areas without rash. Not pale. Not jaundice ?Neurologic:  ?alert & oriented X3.  ?Speech normal, gait appropriate for age and unassisted ?Strength symmetric and appropriate for age.  ?Psych: ?Cognition and judgment appear intact.  ?Cooperative with normal attention span and concentration.  ?Behavior appropriate. ?No anxious or depressed appearing. ? ?   ?Assessment   ? ?  Assessment  ?DM Dr. Dwyane Dee since 07-2014 ?Hyperlipidemia ?Hypothyroidism ?Hyperparathyroidism ?A flutter, ablation 12-2012, then  Paroxysmal Afib, s/p Maze-PFO closure-[place] 02-2016, not anticoagulated ?GERD ?Depression ?Obesity ?Gout ?[name], CPAP since 2012; d/c after wt loss 2019 ? ?PLAN ?Here for CPX ?DM, hypothyroidism, hyperparathyroidism: Per Endo ?Hyperlipidemia: Controlled on Lipitor. ?Gout: No recent problems.  Last uric acid wnl ?GERD: Occasional heartburn, I rec appropriate diet, continue PPIs, he could make famotidine as needed only ?History of A flutter: Asymptomatic, seems regular today, he has a Kardia device, no problems noted.  [place] [facility] 04/2021 ?RTC 1 year.  ? ? ? ?This visit occurred during the SARS-CoV-2 public health emergency.  Safety protocols were in place, including screening questions prior to the visit, additional usage of staff PPE, and extensive cleaning of exam room while observing appropriate contact time as indicated for disinfecting solutions.  ? ?

## 2021-09-09 NOTE — Assessment & Plan Note (Signed)
Here for CPX ?DM, hypothyroidism, hyperparathyroidism: Per Endo ?Hyperlipidemia: Controlled on Lipitor. ?Gout: No recent problems.  Last uric acid wnl ?GERD: Occasional heartburn, I rec appropriate diet, continue PPIs, he could make famotidine as needed only ?History of A flutter: Asymptomatic, seems regular today, he has a Kardia device, no problems noted.  [place] [facility] 04/2021 ?RTC 1 year.  ?

## 2021-09-09 NOTE — Assessment & Plan Note (Signed)
-   Td  2018 ?-pnm 23:  2015; prevnar 2016 ?-shingrex : #1 today, will RTC for #2  ?-covid vax:d/w pt  ?-CCS: ?Cscope 08-2005: int.  hemorrhoids, Cscope 05/2018, cscope 05/2021, next per GI  ?-Prostate cancer screening: DRE normal, check PSA ?-Labs reviewed: Check a CBC, PSA ?-Lifestyle: Patient plans to go back to a healthier diet and increase physical activity ?- ACP information provided ? ?

## 2021-09-10 LAB — CBC WITH DIFFERENTIAL/PLATELET
Basophils Absolute: 0 10*3/uL (ref 0.0–0.2)
Basos: 1 %
EOS (ABSOLUTE): 0.1 10*3/uL (ref 0.0–0.4)
Eos: 3 %
Hematocrit: 45.7 % (ref 37.5–51.0)
Hemoglobin: 15.4 g/dL (ref 13.0–17.7)
Immature Grans (Abs): 0 10*3/uL (ref 0.0–0.1)
Immature Granulocytes: 0 %
Lymphocytes Absolute: 1.5 10*3/uL (ref 0.7–3.1)
Lymphs: 29 %
MCH: 29.2 pg (ref 26.6–33.0)
MCHC: 33.7 g/dL (ref 31.5–35.7)
MCV: 87 fL (ref 79–97)
Monocytes Absolute: 0.5 10*3/uL (ref 0.1–0.9)
Monocytes: 10 %
Neutrophils Absolute: 3 10*3/uL (ref 1.4–7.0)
Neutrophils: 57 %
Platelets: 235 10*3/uL (ref 150–450)
RBC: 5.28 x10E6/uL (ref 4.14–5.80)
RDW: 12.8 % (ref 11.6–15.4)
WBC: 5.2 10*3/uL (ref 3.4–10.8)

## 2021-09-10 LAB — PSA: Prostate Specific Ag, Serum: 2 ng/mL (ref 0.0–4.0)

## 2021-09-22 ENCOUNTER — Encounter: Payer: Self-pay | Admitting: Endocrinology

## 2021-09-22 DIAGNOSIS — E1169 Type 2 diabetes mellitus with other specified complication: Secondary | ICD-10-CM

## 2021-09-22 DIAGNOSIS — E782 Mixed hyperlipidemia: Secondary | ICD-10-CM

## 2021-09-22 MED ORDER — ATORVASTATIN CALCIUM 10 MG PO TABS: ORAL_TABLET | ORAL | 1 refills | Status: DC

## 2021-09-23 ENCOUNTER — Other Ambulatory Visit: Payer: Self-pay | Admitting: Internal Medicine

## 2021-10-01 ENCOUNTER — Other Ambulatory Visit: Payer: Self-pay | Admitting: Internal Medicine

## 2021-10-04 ENCOUNTER — Other Ambulatory Visit: Payer: Self-pay | Admitting: Endocrinology

## 2021-10-07 ENCOUNTER — Other Ambulatory Visit: Payer: Self-pay

## 2021-10-07 DIAGNOSIS — E669 Obesity, unspecified: Secondary | ICD-10-CM

## 2021-10-07 MED ORDER — DEXCOM G7 SENSOR MISC: 3 refills | Status: DC

## 2021-10-10 ENCOUNTER — Other Ambulatory Visit: Payer: Self-pay | Admitting: Endocrinology

## 2021-10-10 DIAGNOSIS — E1169 Type 2 diabetes mellitus with other specified complication: Secondary | ICD-10-CM

## 2021-12-09 ENCOUNTER — Other Ambulatory Visit: Payer: Self-pay

## 2021-12-09 DIAGNOSIS — E1169 Type 2 diabetes mellitus with other specified complication: Secondary | ICD-10-CM

## 2021-12-09 MED ORDER — EMPAGLIFLOZIN 25 MG PO TABS: 25.0000 mg | ORAL_TABLET | Freq: Every day | ORAL | 1 refills | Status: DC

## 2021-12-11 ENCOUNTER — Ambulatory Visit (INDEPENDENT_AMBULATORY_CARE_PROVIDER_SITE_OTHER): Payer: 59

## 2021-12-11 DIAGNOSIS — Z23 Encounter for immunization: Secondary | ICD-10-CM | POA: Diagnosis not present

## 2021-12-11 NOTE — Progress Notes (Signed)
Colin Wood is a 61 y.o. male presents to the office today for his second shingles vaccine.  Vaccine given in left deltoid. Patient tolerated injection well.

## 2021-12-18 ENCOUNTER — Other Ambulatory Visit (INDEPENDENT_AMBULATORY_CARE_PROVIDER_SITE_OTHER): Payer: 59

## 2021-12-18 DIAGNOSIS — E669 Obesity, unspecified: Secondary | ICD-10-CM

## 2021-12-18 DIAGNOSIS — E782 Mixed hyperlipidemia: Secondary | ICD-10-CM

## 2021-12-18 DIAGNOSIS — E1169 Type 2 diabetes mellitus with other specified complication: Secondary | ICD-10-CM | POA: Diagnosis not present

## 2021-12-18 DIAGNOSIS — E790 Hyperuricemia without signs of inflammatory arthritis and tophaceous disease: Secondary | ICD-10-CM

## 2021-12-18 DIAGNOSIS — E063 Autoimmune thyroiditis: Secondary | ICD-10-CM

## 2021-12-19 ENCOUNTER — Other Ambulatory Visit: Payer: Self-pay | Admitting: Endocrinology

## 2021-12-19 DIAGNOSIS — E782 Mixed hyperlipidemia: Secondary | ICD-10-CM

## 2021-12-19 LAB — COMPREHENSIVE METABOLIC PANEL
ALT: 20 IU/L (ref 0–44)
AST: 14 IU/L (ref 0–40)
Albumin/Globulin Ratio: 2.2 (ref 1.2–2.2)
Albumin: 5 g/dL — ABNORMAL HIGH (ref 3.9–4.9)
Alkaline Phosphatase: 59 IU/L (ref 44–121)
BUN/Creatinine Ratio: 21 (ref 10–24)
BUN: 22 mg/dL (ref 8–27)
Bilirubin Total: 0.5 mg/dL (ref 0.0–1.2)
CO2: 25 mmol/L (ref 20–29)
Calcium: 10.9 mg/dL — ABNORMAL HIGH (ref 8.6–10.2)
Chloride: 101 mmol/L (ref 96–106)
Creatinine, Ser: 1.05 mg/dL (ref 0.76–1.27)
Globulin, Total: 2.3 g/dL (ref 1.5–4.5)
Glucose: 113 mg/dL — ABNORMAL HIGH (ref 70–99)
Potassium: 4.8 mmol/L (ref 3.5–5.2)
Sodium: 141 mmol/L (ref 134–144)
Total Protein: 7.3 g/dL (ref 6.0–8.5)
eGFR: 80 mL/min/{1.73_m2} (ref >59)

## 2021-12-19 LAB — TSH: TSH: 1.63 u[IU]/mL (ref 0.450–4.500)

## 2021-12-19 LAB — LIPID PANEL
Chol/HDL Ratio: 3.6 ratio (ref 0.0–5.0)
Cholesterol, Total: 179 mg/dL (ref 100–199)
HDL: 50 mg/dL (ref >39)
LDL Chol Calc (NIH): 105 mg/dL — ABNORMAL HIGH (ref 0–99)
Triglycerides: 135 mg/dL (ref 0–149)
VLDL Cholesterol Cal: 24 mg/dL (ref 5–40)

## 2021-12-19 LAB — HEMOGLOBIN A1C
Est. average glucose Bld gHb Est-mCnc: 123 mg/dL
Hgb A1c MFr Bld: 5.9 % — ABNORMAL HIGH (ref 4.8–5.6)

## 2021-12-19 LAB — URIC ACID: Uric Acid: 5.7 mg/dL (ref 3.8–8.4)

## 2021-12-24 NOTE — Progress Notes (Unsigned)
Patient ID: Colin Wood, male   DOB: 1960-12-14, 61 y.o.   MRN: [id number]           Reason for Appointment: Follow-up for various problems  Referring physician: Larose Kells  History of Present Illness:          Diagnosis: Type 2 diabetes mellitus, date of diagnosis: ?  2011        Past history: He was not having any symptoms at the time of diagnosis and not clear what his initial blood sugars were He was started on metformin initially and had fairly good control Subsequently with higher sugars he was given Amaryl also in addition which has been continued.  A1c was 10.6% in 2012 Subsequently in 2014 his A1c was down to 6.5 but he had a regular follow-up subsequently Because of an A1c of 8.1 in 02/2014 he was given Tradjenta in addition to the above drugs but this was not effective  He was referred here because of a high A1c of 7.9 in 07/2014  Recent history:   Oral hypoglycemic drugs the patient is taking are: metformin 1 g twice a day, Trulicity 4.5 mg weekly, Jardiance 25 mg daily      A1c is stable at 6.0, generally about the same  Current management, blood sugar values and problems identified: He has been using the Dexcom sensor with some benefit in helping him observe when his blood sugars go up  He thinks that he has higher readings with the sensor compared to his fingerstick at least for a day after he starts the sensor  However on the day he had his labs drawn has not blood sugar was slightly lower on the sensor compared to the lab glucose  Generally he has been trying to do a little better with the diet except on occasion when he may have more sweets  He has not had any difficulty getting his Trulicity prescription filled he usually Only has skipped 1 dose He says that he has been generally trying to get into a walking program walking a 15-minute mile for couple miles at times Weight is down 6 pounds recently         Side effects from medications have been:None  Compliance  with the medical regimen: Fair   .      Interpretation of his Dexcom G6 sensor download shows the following  HIGHEST blood sugars overall are late in the evening or midmorning and lowest blood sugars are before lunch OVERNIGHT blood sugars are mildly increased overall averaging in the 140-160 range and appearing to be higher early morning between 4-7 AM POSTPRANDIAL readings are somewhat variable with episodes of significant hyperglycemia but 2 or 3 times a week after every meal and relatively more often after dinner in the last week No hypoglycemia  Pre-meal blood sugars are generally higher before dinner compared to lunchtime  fasting readings are generally mildly increased with some dawn phenomenon present  CGM use % of time   2-week average/GV   Time in range      87  %  % Time Above 180   % Time above 250   % Time Below 70      PRE-MEAL Fasting Lunch Dinner Bedtime Overall  Glucose range:       Averages:        POST-MEAL PC Breakfast PC Lunch PC Dinner  Glucose range:     Averages:        CGM use % of time 93  2-week average/GV 155  Time in range        81%  % Time Above 180 18  % Time above 250   % Time Below 70 0     PRE-MEAL Fasting Lunch Dinner Bedtime Overall  Glucose range:       Averages: 147 124 146 166    POST-MEAL PC Breakfast PC Lunch PC Dinner  Glucose range:     Averages: 169  166 168     Self-care: Meal planning is fair, sometimes getting biscuits in the morning          Dietician visit, most recent: none, previously had gone to a class          Weight history: Previous range 260-280  Wt Readings from Last 3 Encounters:  12/25/21 223 lb 9.6 oz (101.4 kg)  09/09/21 231 lb 4 oz (104.9 kg)  08/13/21 231 lb 6.4 oz (105 kg)    Glycemic control:   Lab Results  Component Value Date   HGBA1C 5.9 (H) 12/18/2021   HGBA1C 6.0 (H) 08/07/2021   HGBA1C 6.1 (H) 04/03/2021   Lab Results  Component Value Date   LDLCALC 105 (H) 12/18/2021    CREATININE 1.05 12/18/2021     Allergies as of 12/25/2021   No Known Allergies      Medication List        Accurate as of December 25, 2021  8:14 AM. If you have any questions, ask your nurse or doctor.          allopurinol 100 MG tablet Commonly known as: ZYLOPRIM TAKE 2 TABLETS(200 MG) BY MOUTH DAILY   atorvastatin 10 MG tablet Commonly known as: LIPITOR TAKE 1 TABLET(10 MG) BY MOUTH DAILY   azelastine 0.1 % nasal spray Commonly known as: ASTELIN Place 2 sprays into both nostrils at bedtime as needed for rhinitis or allergies.   Cholecalciferol 50 MCG (2000 UT) Caps Take 2 capsules by mouth daily at 6 (six) AM.   Contour Next Test test strip Generic drug: glucose blood CHECK BLOOD SUGAR THREE TIMES DAILY AS DIRECTED   Dexcom G7 Sensor Misc Change every 10 days What changed: Another medication with the same name was removed. Continue taking this medication, and follow the directions you see here. Changed by: Elayne Snare, MD   empagliflozin 25 MG Tabs tablet Commonly known as: Jardiance Take 1 tablet (25 mg total) by mouth daily.   famotidine 20 MG tablet Commonly known as: PEPCID TAKE 1 TABLET(20 MG) BY MOUTH AT BEDTIME   fenofibrate 160 MG tablet Take 1 tablet (160 mg total) by mouth daily.   fluticasone 50 MCG/ACT nasal spray Commonly known as: FLONASE Place 2 sprays into both nostrils daily. What changed:  when to take this reasons to take this   icosapent Ethyl 1 g capsule Commonly known as: VASCEPA TAKE 2 CAPSULES(2 GRAMS) BY MOUTH TWICE DAILY   levothyroxine 137 MCG tablet Commonly known as: SYNTHROID TAKE 1 TABLET BY MOUTH DAILY BEFORE BREAKFAST   lisinopril 5 MG tablet Commonly known as: ZESTRIL TAKE 1 TABLET(5 MG) BY MOUTH DAILY   Magnesium 500 MG Tabs Take 1 tablet by mouth daily in the afternoon.   metFORMIN 1000 MG tablet Commonly known as: GLUCOPHAGE TAKE 1 TABLET(1000 MG) BY MOUTH TWICE DAILY WITH A MEAL   Microlet Lancets  Misc USE TO CHECK BLOOD SUGAR THREE TIMES DAILY   multivitamin tablet Take 1 tablet by mouth daily.   pantoprazole 40 MG tablet Commonly known as:  PROTONIX TAKE 1 TABLET(40 MG) BY MOUTH DAILY   SENNA-DOCUSATE SODIUM PO Take 1 tablet by mouth daily.   Trulicity 4.5 JT/7.0VX Sopn Generic drug: Dulaglutide INJECT 4.'5MG'$  AS DIRECTED ONCE A WEEK   VITAMIN C PO Take 2 tablets by mouth 2 (two) times daily.   Zinc 50 MG Caps Take 1 capsule by mouth daily at 6 (six) AM.        Allergies:  No Known Allergies  Past Medical History:  Diagnosis Date   Anxiety    on meds   Atrial flutter ([place])    typical appearing; s/p ablation 12-2012   Constipation    Depression    hx of   Diabetes mellitus    TYPE 2-on meds   Fatty liver    GERD (gastroesophageal reflux disease)    Gout    Hyperlipidemia    Hypothyroidism    on meds   Leg edema    Obesity    Persistent atrial fibrillation (HCC)    PVI 12/2012   Prediabetes    Rhinitis    vasomotor   S/P Minimally invasive maze operation for atrial fibrillation 03/12/2016   Complete bilateral atrial lesion set using cryothermy and bipolar radiofrequency ablation with clipping of LA appendage via right mini thoracotomy approach   S/P patent foramen ovale closure 03/12/2016   Sleep apnea    mild, now treated with CPAP since ~9-12    Past Surgical History:  Procedure Laterality Date   ABLATION OF DYSRHYTHMIC FOCUS  01/05/2013   PVI and flutter ablation by [name]   ATRIAL FIBRILLATION ABLATION N/A 01/05/2013   Procedure: ATRIAL FIBRILLATION ABLATION;  Surgeon: Porcupine Grayer, MD;  Location: [facility] CATH LAB;  Service: Cardiovascular;  Laterality: N/A;   CARDIAC CATHETERIZATION N/A 01/12/2016   Procedure: Right/Left Heart Cath and Coronary Angiography;  Surgeon: [name], MD;  Location: [place] CV LAB;  Service: Cardiovascular;  Laterality: N/A;   CARDIOVERSION  03/01/2011   CLIPPING OF ATRIAL APPENDAGE Left 03/12/2016    Procedure: CLIPPING OF ATRIAL APPENDAGE;  Surgeon: [name], MD;  Location: [place];  Service: Open Heart Surgery;  Laterality: Left;   COLONOSCOPY  2020   HD-suprep(good)   MINIMALLY INVASIVE MAZE PROCEDURE N/A 03/12/2016   Procedure: MINIMALLY INVASIVE MAZE PROCEDURE;  Surgeon: [name], MD;  Location: [place];  Service: Open Heart Surgery;  Laterality: N/A;   NASAL SINUS SURGERY     TEE WITHOUT CARDIOVERSION N/A 01/04/2013   Procedure: TRANSESOPHAGEAL ECHOCARDIOGRAM (TEE);  Surgeon: [name], MD;  Location: [facility] ENDOSCOPY;  Service: Cardiovascular;  Laterality: N/A;   TEE WITHOUT CARDIOVERSION N/A 03/12/2016   Procedure: TRANSESOPHAGEAL ECHOCARDIOGRAM (TEE);  Surgeon: [name], MD;  Location: [place];  Service: Open Heart Surgery;  Laterality: N/A;   TONSILLECTOMY     VASECTOMY     WISDOM TOOTH EXTRACTION      Family History  Problem Relation Age of Onset   Asthma Mother    Breast cancer Mother    Dementia Mother    Colon polyps Father 19   Diabetes Father    Hyperlipidemia Father    Obesity Father    Atrial fibrillation Father    Hypertension Brother    Melanoma Brother    Throat cancer Maternal Uncle    Colon polyps Paternal Grandfather 7   Colon cancer Paternal Grandfather 72   Allergies Daughter    Prostate cancer Neg Hx    Esophageal cancer Neg Hx  Stomach cancer Neg Hx    Rectal cancer Neg Hx     Social History:  reports that he has never smoked. He has never used smokeless tobacco. He reports that he does not drink alcohol and does not use drugs.    Review of Systems         Lipids:  has high triglycerides mostly previously as high as 681; LDL previously also above 100 at baseline.   On Vascepa for triglycerides, also taking Lipitor 10 mg daily He has been on either Vascepa or Lovaza since about 2016 and has not had any issues with atrial fibrillation for the last few years  Has taken fenofibrate since his last visit and triglycerides are  back to near normal However despite taking 10 mg Lipitor his LDL is slightly higher at 99      . Lab Results  Component Value Date   CHOL 179 12/18/2021   CHOL 172 08/07/2021   CHOL 157 04/03/2021   Lab Results  Component Value Date   HDL 50 12/18/2021   HDL 47 08/07/2021   HDL 39 (L) 04/03/2021   Lab Results  Component Value Date   LDLCALC 105 (H) 12/18/2021   LDLCALC 99 08/07/2021   LDLCALC 71 04/03/2021   Lab Results  Component Value Date   TRIG 135 12/18/2021   TRIG 151 (H) 08/07/2021   TRIG 290 (H) 04/03/2021   Lab Results  Component Value Date   CHOLHDL 3.6 12/18/2021   CHOLHDL 3.7 08/07/2021   CHOLHDL 4.0 04/03/2021   No results found for: "LDLDIRECT"                  HYPOTHYROIDISM: He was diagnosed to have primary hypothyroidism in 1994  He is consistent with his generic levothyroxine every morning before eating  Has been on levothyroxine 137 mcg daily TSH consistently normal  Lab Results  Component Value Date   TSH 1.630 12/18/2021   TSH 2.100 08/07/2021   TSH 1.800 04/03/2021   FREET4 1.77 07/25/2020   FREET4 1.49 03/25/2020   FREET4 1.86 (H) 11/14/2019       The blood pressure has been treated by cardiologist with 5 mg lisinopril    BP Readings from Last 3 Encounters:  12/25/21 118/70  09/09/21 126/60  08/13/21 122/70   No recurrence of hyperkalemia  Lab Results  Component Value Date   K 4.8 12/18/2021    Vitamin D deficiency:  Has been prescribed vitamin D weekly with levels as low as 25, taking 4000 units vitamin D3 regularly Levels as follows  Lab Results  Component Value Date   VD25OH 44.7 08/07/2021   VD25OH 44.1 11/28/2020   VD25OH 31.2 03/25/2020   VD25OH 33.6 07/11/2019     HYPERCALCEMIA: The calcium continues to be slightly high or upper normal, also note that his albumin level is slightly high PTH level has been normal, last 26 No history of kidney stones or known osteopenia  Lab Results  Component Value  Date   CALCIUM 10.9 (H) 12/18/2021   CALCIUM 10.3 (H) 08/07/2021   CALCIUM 10.2 04/03/2021   CALCIUM 10.3 (H) 11/28/2020   Lab Results  Component Value Date   PTH 26 07/11/2019   CALCIUM 10.9 (H) 12/18/2021   CAION 1.15 03/13/2016     He has been prescribed allopurinol by PCP  Physical Examination:  BP 118/70   Pulse 76   Ht '6\' 1"'$  (1.854 m)   Wt 223 lb 9.6 oz (101.4 kg)  SpO2 96%   BMI 29.50 kg/m      ASSESSMENT    Diabetes type 2, with mild obesity  He is on a 3 drug regimen of metformin, Trulicity 4.5 mg and Jardiance 25 mg   See history of present illness for discussion of his current management, blood sugar patterns and problems identified.   His A1c is about the same 6% As before he tends to have relatively high fasting readings although only 123 on his lab and not consistently high With using the Dexcom sensor he has done somewhat better with his diet recently and postprandial readings are not consistently high Although his Dexcom sensor is at times reading higher than actual reading his blood sugars are averaging 155 in the last 2 weeks Recently has started significant walking program for exercise and hopefully can continue to develop this   HYPERPARATHYROIDISM: Has stable upper normal calcium although albumin is slightly high also   HYPOTHYROIDISM: Subjectively doing well TSH consistently normal with taking 137 mcg levothyroxine   Lipids: He has mixed hyperlipidemia and with restarting fenofibrate triglycerides are down to 151 LDL is upper normal in the target range and may be related to changes in the diet over the last 2 to 3 months   Mild hypertension: Controlled only on 5 mg lisinopril    PLAN:  7/23 No change in Trulicity or Jardiance As before he can do better with consistent exercise and he will start walking  Also continue to modify diet Requested him to watch dairy and animal fats for lipids and this will also reduce caloric intake   Continue to use the Dexcom G7 sensor To keep monitoring calcium levels since this is relatively stable and asymptomatic  Continue same dose of Synthroid Atorvastatin will be increased to 20 mg, he can use his 10 mg prescription taking 2 pills a day and then request new Rx Fasting testosterone level to be ordered for his symptoms of fatigue  Follow-up in 4 months again    There are no Patient Instructions on file for this visit.     Elayne Snare 12/25/2021, 8:14 AM   Note: This office note was prepared with Dragon voice recognition system technology. Any transcriptional errors that result from this process are unintentional.

## 2021-12-25 ENCOUNTER — Encounter: Payer: Self-pay | Admitting: Endocrinology

## 2021-12-25 ENCOUNTER — Ambulatory Visit: Payer: 59 | Admitting: Endocrinology

## 2021-12-25 VITALS — BP 118/70 | HR 76 | Ht 73.0 in | Wt 223.6 lb

## 2021-12-25 DIAGNOSIS — E063 Autoimmune thyroiditis: Secondary | ICD-10-CM | POA: Diagnosis not present

## 2021-12-25 DIAGNOSIS — E669 Obesity, unspecified: Secondary | ICD-10-CM | POA: Diagnosis not present

## 2021-12-25 DIAGNOSIS — E782 Mixed hyperlipidemia: Secondary | ICD-10-CM

## 2021-12-25 DIAGNOSIS — R5383 Other fatigue: Secondary | ICD-10-CM | POA: Diagnosis not present

## 2021-12-25 DIAGNOSIS — E1169 Type 2 diabetes mellitus with other specified complication: Secondary | ICD-10-CM

## 2021-12-25 DIAGNOSIS — E559 Vitamin D deficiency, unspecified: Secondary | ICD-10-CM

## 2021-12-25 NOTE — Patient Instructions (Signed)
Lipitor 20 mg

## 2021-12-27 ENCOUNTER — Other Ambulatory Visit: Payer: Self-pay | Admitting: Endocrinology

## 2021-12-29 ENCOUNTER — Encounter: Payer: Self-pay | Admitting: Endocrinology

## 2021-12-30 ENCOUNTER — Other Ambulatory Visit: Payer: Self-pay | Admitting: Internal Medicine

## 2022-01-19 ENCOUNTER — Other Ambulatory Visit: Payer: Self-pay

## 2022-01-19 DIAGNOSIS — E669 Obesity, unspecified: Secondary | ICD-10-CM

## 2022-01-19 MED ORDER — TRULICITY 4.5 MG/0.5ML ~~LOC~~ SOAJ: SUBCUTANEOUS | 2 refills | Status: DC

## 2022-01-20 ENCOUNTER — Encounter: Payer: Self-pay | Admitting: Internal Medicine

## 2022-01-21 ENCOUNTER — Encounter: Payer: Self-pay | Admitting: Internal Medicine

## 2022-02-14 ENCOUNTER — Encounter: Payer: Self-pay | Admitting: Endocrinology

## 2022-02-14 DIAGNOSIS — E1169 Type 2 diabetes mellitus with other specified complication: Secondary | ICD-10-CM

## 2022-02-15 MED ORDER — ATORVASTATIN CALCIUM 20 MG PO TABS: ORAL_TABLET | ORAL | 3 refills | Status: DC

## 2022-02-21 ENCOUNTER — Other Ambulatory Visit: Payer: Self-pay | Admitting: Endocrinology

## 2022-02-21 DIAGNOSIS — E669 Obesity, unspecified: Secondary | ICD-10-CM

## 2022-02-23 ENCOUNTER — Other Ambulatory Visit: Payer: Self-pay | Admitting: Endocrinology

## 2022-02-23 DIAGNOSIS — E669 Obesity, unspecified: Secondary | ICD-10-CM

## 2022-03-02 ENCOUNTER — Encounter: Payer: Self-pay | Admitting: Endocrinology

## 2022-03-02 DIAGNOSIS — E1169 Type 2 diabetes mellitus with other specified complication: Secondary | ICD-10-CM

## 2022-03-03 MED ORDER — DEXCOM G7 SENSOR MISC: 3 refills | Status: DC

## 2022-03-10 ENCOUNTER — Other Ambulatory Visit: Payer: Self-pay | Admitting: Endocrinology

## 2022-03-10 DIAGNOSIS — E1169 Type 2 diabetes mellitus with other specified complication: Secondary | ICD-10-CM

## 2022-04-30 ENCOUNTER — Other Ambulatory Visit: Payer: Self-pay

## 2022-04-30 ENCOUNTER — Other Ambulatory Visit (INDEPENDENT_AMBULATORY_CARE_PROVIDER_SITE_OTHER): Payer: 59

## 2022-04-30 DIAGNOSIS — R5383 Other fatigue: Secondary | ICD-10-CM

## 2022-04-30 DIAGNOSIS — E1169 Type 2 diabetes mellitus with other specified complication: Secondary | ICD-10-CM | POA: Diagnosis not present

## 2022-04-30 DIAGNOSIS — E063 Autoimmune thyroiditis: Secondary | ICD-10-CM

## 2022-04-30 DIAGNOSIS — E782 Mixed hyperlipidemia: Secondary | ICD-10-CM

## 2022-04-30 DIAGNOSIS — E669 Obesity, unspecified: Secondary | ICD-10-CM

## 2022-04-30 DIAGNOSIS — E559 Vitamin D deficiency, unspecified: Secondary | ICD-10-CM

## 2022-04-30 MED ORDER — METFORMIN HCL 1000 MG PO TABS: ORAL_TABLET | ORAL | 1 refills | Status: DC

## 2022-05-06 LAB — LIPID PANEL
Chol/HDL Ratio: 3.5 ratio (ref 0.0–5.0)
Cholesterol, Total: 164 mg/dL (ref 100–199)
HDL: 47 mg/dL (ref >39)
LDL Chol Calc (NIH): 93 mg/dL (ref 0–99)
Triglycerides: 133 mg/dL (ref 0–149)
VLDL Cholesterol Cal: 24 mg/dL (ref 5–40)

## 2022-05-06 LAB — COMPREHENSIVE METABOLIC PANEL
ALT: 21 IU/L (ref 0–44)
AST: 16 IU/L (ref 0–40)
Albumin/Globulin Ratio: 2.3 — ABNORMAL HIGH (ref 1.2–2.2)
Albumin: 4.9 g/dL (ref 3.9–4.9)
Alkaline Phosphatase: 51 IU/L (ref 44–121)
BUN/Creatinine Ratio: 22 (ref 10–24)
BUN: 22 mg/dL (ref 8–27)
Bilirubin Total: 0.4 mg/dL (ref 0.0–1.2)
CO2: 23 mmol/L (ref 20–29)
Calcium: 10.3 mg/dL — ABNORMAL HIGH (ref 8.6–10.2)
Chloride: 100 mmol/L (ref 96–106)
Creatinine, Ser: 0.98 mg/dL (ref 0.76–1.27)
Globulin, Total: 2.1 g/dL (ref 1.5–4.5)
Glucose: 128 mg/dL — ABNORMAL HIGH (ref 70–99)
Potassium: 4.5 mmol/L (ref 3.5–5.2)
Sodium: 140 mmol/L (ref 134–144)
Total Protein: 7 g/dL (ref 6.0–8.5)
eGFR: 87 mL/min/{1.73_m2} (ref >59)

## 2022-05-06 LAB — VITAMIN D 25 HYDROXY (VIT D DEFICIENCY, FRACTURES): Vit D, 25-Hydroxy: 37.3 ng/mL (ref 30.0–100.0)

## 2022-05-06 LAB — HEMOGLOBIN A1C
Est. average glucose Bld gHb Est-mCnc: 126 mg/dL
Hgb A1c MFr Bld: 6 % — ABNORMAL HIGH (ref 4.8–5.6)

## 2022-05-06 LAB — TESTOSTERONE, TOTAL, LC/MS/MS: Testosterone, total: 501.7 ng/dL (ref 264.0–916.0)

## 2022-05-06 LAB — TSH: TSH: 1.75 u[IU]/mL (ref 0.450–4.500)

## 2022-05-07 ENCOUNTER — Ambulatory Visit: Payer: 59 | Admitting: Endocrinology

## 2022-05-14 ENCOUNTER — Ambulatory Visit (HOSPITAL_COMMUNITY)
Admission: RE | Admit: 2022-05-14 | Discharge: 2022-05-14 | Disposition: A | Discharge status: Home / Self Care | Payer: 59 | Source: Ambulatory Visit | Attending: Physician Assistant | Admitting: Physician Assistant

## 2022-05-14 VITALS — BP 124/66 | HR 88 | Ht 73.0 in | Wt 224.4 lb

## 2022-05-14 DIAGNOSIS — I4819 Other persistent atrial fibrillation: Secondary | ICD-10-CM | POA: Insufficient documentation

## 2022-05-14 DIAGNOSIS — E669 Obesity, unspecified: Secondary | ICD-10-CM

## 2022-05-14 DIAGNOSIS — I4510 Unspecified right bundle-branch block: Secondary | ICD-10-CM | POA: Insufficient documentation

## 2022-05-14 DIAGNOSIS — E1169 Type 2 diabetes mellitus with other specified complication: Secondary | ICD-10-CM

## 2022-05-14 MED ORDER — ATORVASTATIN CALCIUM 20 MG PO TABS: ORAL_TABLET | ORAL | Status: DC

## 2022-05-14 MED ORDER — LISINOPRIL 5 MG PO TABS: ORAL_TABLET | ORAL | 3 refills | Status: DC

## 2022-05-14 NOTE — Progress Notes (Signed)
Patient ID: Colin Wood, male   DOB: 1960/09/07, 61 y.o.   MRN: [id number]     Primary Care Physician: Colon Branch, MD Referring Physician: Dr. Faythe Ghee is a 61 y.o. male with a h/o PAF. failing tikosyn in the past and as well as s/p ablation in 2014,who  developed  persistent afib. He had a Maze procedure, with clipping of left atrial appendage, last October. He has not had any afib since the 2nd day of surgery. It took about one month to recover but he is now back to baseline and is feeling much better.  F/u in afib clinic, 04/20/18, he continues to do well s/p maze procedure. No afib at all noted. Off AAD for some time now, continues on xarelto without bleeding issues.  F/u in afib clinic, 11/18/20. He remains in SR, s/p maze procedure in 2019. He has not noted any afib since the procedure. He is not on anticoagulation as this point and is ot on CPAP with losing 40 lbs.   Follow up in the AF clinic 05/14/22. Patient reports that he has done well since his last visit with no episodes of afib. He is not currently on anticoagulation.   Today, he denies symptoms of palpitations, chest pain, shortness of breath, orthopnea, PND, lower extremity edema, dizziness, presyncope, syncope, or neurologic sequela. The patient is tolerating medications without difficulties and is otherwise without complaint today.   Past Medical History:  Diagnosis Date   Anxiety    on meds   Atrial flutter ([name])    typical appearing; s/p ablation 12-2012   Constipation    Depression    hx of   Diabetes mellitus    TYPE 2-on meds   Fatty liver    GERD (gastroesophageal reflux disease)    Gout    Hyperlipidemia    Hypothyroidism    on meds   Leg edema    Obesity    Persistent atrial fibrillation (HCC)    PVI 12/2012   Prediabetes    Rhinitis    vasomotor   S/P Minimally invasive maze operation for atrial fibrillation 03/12/2016   Complete bilateral atrial lesion set using cryothermy  and bipolar radiofrequency ablation with clipping of LA appendage via right mini thoracotomy approach   S/P patent foramen ovale closure 03/12/2016   Sleep apnea    mild, now treated with CPAP since ~9-12   Past Surgical History:  Procedure Laterality Date   ABLATION OF DYSRHYTHMIC FOCUS  01/05/2013   PVI and flutter ablation by [name]   ATRIAL FIBRILLATION ABLATION N/A 01/05/2013   Procedure: ATRIAL FIBRILLATION ABLATION;  Surgeon: Matlin Grayer, MD;  Location: [facility] CATH LAB;  Service: Cardiovascular;  Laterality: N/A;   CARDIAC CATHETERIZATION N/A 01/12/2016   Procedure: Right/Left Heart Cath and Coronary Angiography;  Surgeon: [name], MD;  Location: [place] CV LAB;  Service: Cardiovascular;  Laterality: N/A;   CARDIOVERSION  03/01/2011   CLIPPING OF ATRIAL APPENDAGE Left 03/12/2016   Procedure: CLIPPING OF ATRIAL APPENDAGE;  Surgeon: [name], MD;  Location: [place];  Service: Open Heart Surgery;  Laterality: Left;   COLONOSCOPY  2020   HD-suprep(good)   MINIMALLY INVASIVE MAZE PROCEDURE N/A 03/12/2016   Procedure: MINIMALLY INVASIVE MAZE PROCEDURE;  Surgeon: [name], MD;  Location: [place];  Service: Open Heart Surgery;  Laterality: N/A;   NASAL SINUS SURGERY     TEE WITHOUT CARDIOVERSION N/A 01/04/2013   Procedure: TRANSESOPHAGEAL ECHOCARDIOGRAM (TEE);  Surgeon: [name], MD;  Location[facility] ENDOSCOPY;  Service: Cardiovascular;  Laterality: N/A;   TEE WITHOUT CARDIOVERSION N/A 03/12/2016   Procedure: TRANSESOPHAGEAL ECHOCARDIOGRAM (TEE);  Surgeon: [name], MD;  Location: [place];  Service: Open Heart Surgery;  Laterality: N/A;   TONSILLECTOMY     VASECTOMY     WISDOM TOOTH EXTRACTION      Current Outpatient Medications  Medication Sig Dispense Refill   allopurinol (ZYLOPRIM) 100 MG tablet TAKE 2 TABLETS(200 MG) BY MOUTH DAILY 180 tablet 1   Ascorbic Acid (VITAMIN C PO) Take 2 tablets by mouth 2 (two) times daily.     azelastine (ASTELIN) 0.1 %  nasal spray Place 2 sprays into both nostrils at bedtime as needed for rhinitis or allergies. 30 mL 3   Cholecalciferol 50 MCG (2000 UT) CAPS Take 2 capsules by mouth daily at 6 (six) AM.     Continuous Blood Gluc Sensor (DEXCOM G7 SENSOR) MISC Change every 10 days 9 each 3   Dulaglutide (TRULICITY) 4.5 FB/5.1WC SOPN INJECT 4.'5MG'$  AS DIRECTED ONCE A WEEK 6 mL 2   famotidine (PEPCID) 20 MG tablet TAKE 1 TABLET(20 MG) BY MOUTH AT BEDTIME 90 tablet 2   fenofibrate 160 MG tablet TAKE 1 TABLET(160 MG) BY MOUTH DAILY 90 tablet 2   fluticasone (FLONASE) 50 MCG/ACT nasal spray Place 2 sprays into both nostrils daily. (Patient taking differently: Place 2 sprays into both nostrils daily as needed.) 16 mL 0   glucose blood (CONTOUR NEXT TEST) test strip CHECK BLOOD SUGAR THREE TIMES DAILY AS DIRECTED 300 strip 3   icosapent Ethyl (VASCEPA) 1 g capsule TAKE 2 CAPSULES(2 GRAMS) BY MOUTH TWICE DAILY 360 capsule 3   JARDIANCE 25 MG TABS tablet TAKE 1 TABLET(25 MG) BY MOUTH DAILY 90 tablet 1   levothyroxine (SYNTHROID) 137 MCG tablet TAKE 1 TABLET BY MOUTH DAILY BEFORE BREAKFAST 90 tablet 3   Magnesium 500 MG TABS Take 1 tablet by mouth daily in the afternoon.     metFORMIN (GLUCOPHAGE) 1000 MG tablet TAKE 1 TABLET(1000 MG) BY MOUTH TWICE DAILY WITH A MEAL 180 tablet 1   Microlet Lancets MISC USE TO CHECK BLOOD SUGAR THREE TIMES DAILY 100 each 3   pantoprazole (PROTONIX) 40 MG tablet TAKE 1 TABLET(40 MG) BY MOUTH DAILY 90 tablet 3   Sennosides-Docusate Sodium (SENNA-DOCUSATE SODIUM PO) Take 1 tablet by mouth as needed.     Zinc 50 MG CAPS Take 1 capsule by mouth daily at 6 (six) AM.     atorvastatin (LIPITOR) 20 MG tablet TAKE 1 TABLET(20 MG) BY MOUTH DAILY     lisinopril (ZESTRIL) 5 MG tablet TAKE 1 TABLET(5 MG) BY MOUTH DAILY 90 tablet 3   No current facility-administered medications for this encounter.    No Known Allergies  Social History   Socioeconomic History   Marital status: Married    Spouse  name: Mardene Celeste   Number of children: 2   Years of education: Not on file   Highest education level: Not on file  Occupational History   Occupation: [employer]  Tobacco Use   Smoking status: Never   Smokeless tobacco: Never  Vaping Use   Vaping Use: Never used  Substance and Sexual Activity   Alcohol use: No   Drug use: No   Sexual activity: Not on file  Other Topics Concern   Not on file  Social History Narrative   Pt lives in [place] with spouse and 1 child  2 children  1988, 2000 .     Works in Engineer, technical sales at [employer] Strain: Not on [employer] Insecurity: Not on file  Transportation Needs: Not on file  Physical Activity: Not on file  Stress: Not on file  Social Connections: Not on file  Intimate Partner Violence: Not on file    Family History  Problem Relation Age of Onset   Asthma Mother    Breast cancer Mother    Dementia Mother    Colon polyps Father 32   Diabetes Father    Hyperlipidemia Father    Obesity Father    Atrial fibrillation Father    Hypertension Brother    Melanoma Brother    Throat cancer Maternal Uncle    Colon polyps Paternal Grandfather 74   Colon cancer Paternal Grandfather 45   Allergies Daughter    Prostate cancer Neg Hx    Esophageal cancer Neg Hx    Stomach cancer Neg Hx    Rectal cancer Neg Hx     ROS- All systems are reviewed and negative except as per the HPI above  Physical Exam: Vitals:   05/14/22 0827  BP: 124/66  Pulse: 88  Weight: 101.8 kg  Height: '6\' 1"'$  (1.854 m)    GEN- The patient is a well appearing male, alert and oriented x 3 today.   HEENT-head normocephalic, atraumatic, sclera clear, conjunctiva pink, hearing intact, trachea midline. Lungs- Clear to ausculation bilaterally, normal work of breathing Heart- Regular rate and rhythm, no murmurs, rubs or gallops  GI- soft, NT, ND, + BS Extremities- no clubbing, cyanosis, or edema MS- no significant deformity  or atrophy Skin- no rash or lesion Psych- euthymic mood, full affect Neuro- strength and sensation are intact   EKG- SR, PACs, RBBB, LAFB Vent. rate 88 BPM PR interval * ms QRS duration 126 ms QT/QTcB 400/484 ms   Epic records reviewed  Assessment and Plan: 1. Persistent AFib  s/p maze and LAA clip Patient appears to be maintaining SR.  Off xarelto with chadsvasc score of  1 and LAA clip.    Follow up to establish care with a new EP in one year.   [facility] [address] [place], [place] [address] [phone]

## 2022-05-18 ENCOUNTER — Other Ambulatory Visit: Payer: Self-pay | Admitting: Endocrinology

## 2022-05-18 DIAGNOSIS — E1169 Type 2 diabetes mellitus with other specified complication: Secondary | ICD-10-CM

## 2022-05-19 ENCOUNTER — Encounter: Payer: Self-pay | Admitting: Endocrinology

## 2022-05-19 ENCOUNTER — Ambulatory Visit: Payer: 59 | Admitting: Endocrinology

## 2022-05-19 VITALS — BP 126/70 | HR 85 | Ht 73.0 in | Wt 224.0 lb

## 2022-05-19 DIAGNOSIS — E669 Obesity, unspecified: Secondary | ICD-10-CM | POA: Diagnosis not present

## 2022-05-19 DIAGNOSIS — E063 Autoimmune thyroiditis: Secondary | ICD-10-CM

## 2022-05-19 DIAGNOSIS — E1169 Type 2 diabetes mellitus with other specified complication: Secondary | ICD-10-CM

## 2022-05-19 DIAGNOSIS — E782 Mixed hyperlipidemia: Secondary | ICD-10-CM

## 2022-05-19 DIAGNOSIS — E790 Hyperuricemia without signs of inflammatory arthritis and tophaceous disease: Secondary | ICD-10-CM

## 2022-05-19 MED ORDER — ATORVASTATIN CALCIUM 20 MG PO TABS: ORAL_TABLET | ORAL | 2 refills | Status: DC

## 2022-05-19 NOTE — Progress Notes (Unsigned)
Patient ID: Colin Wood, male   DOB: 1960/09/07, 61 y.o.   MRN: [id number]           Reason for Appointment: Follow-up for various problems   History of Present Illness:          Diagnosis: Type 2 diabetes mellitus, date of diagnosis: ?  2011        Past history: He was not having any symptoms at the time of diagnosis and not clear what his initial blood sugars were He was started on metformin initially and had fairly good control Subsequently with higher sugars he was given Amaryl also in addition which has been continued.  A1c was 10.6% in 2012 Subsequently in 2014 his A1c was down to 6.5 but he had a regular follow-up subsequently Because of an A1c of 8.1 in 02/2014 he was given Tradjenta in addition to the above drugs but this was not effective  He was referred here because of a high A1c of 7.9 in 07/2014  Recent history:   Oral hypoglycemic drugs the patient is taking are: metformin 1 g twice a day, Trulicity 4.5 mg weekly, Jardiance 25 mg daily      A1c is stable at 6.0, may be lower than expected for his actual sugars  Current management, blood sugar values and problems identified: His Dexcom appears to be reading a little higher than expected for his actual blood sugars and A1c  Lately has had fairly good blood sugars but still have some postprandial spikes at different times but not consistently based on diet Has been taking all his medications regularly and has no side effects with any of those send has had no difficulty getting his prescriptions filled on time Overall blood sugars are on an average nearly the same throughout the day and night He has done some walking for exercise         Side effects from medications have been:None  Compliance with the medical regimen: Fair   .      Interpretation of his Dexcom G6 sensor download shows the following  Blood sugars appear to be somewhat higher than actual readings with fasting glucose on the Dexcom about 15 mg  higher than the lab glucose at the same time  OVERNIGHT blood sugars are consistently stable with lowest readings around 7 AM averaging 146  POSTPRANDIAL readings are variably high and may periodically go above 180 after breakfast and occasionally after dinner but usually not as much after dinner In the last week has not had as many excursions above target especially in the evenings as seen on the weekly print out Premeal readings are usually in the 140-160 range and highest postprandial reading after breakfast is 174  Time in range is somewhat less compared to the last visit No hypoglycemia  CGM use % of time   2-week average/GV 159  Time in range     76   %  % Time Above 180 23  % Time above 250   % Time Below 70      Previously  CGM use % of time   2-week average/ 149+/-26  Time in range      87  % was 81  % Time Above 180 12  % Time above 250   % Time Below 70      PRE-MEAL Fasting Lunch Dinner Bedtime Overall  Glucose range:       Averages: 145       POST-MEAL PC Breakfast PC  Lunch PC Dinner  Glucose range:     Averages: 170 166 162    Self-care: Meal planning is fair, sometimes getting biscuits in the morning          Dietician visit, most recent: none, previously had gone to a class          Weight history: Previous range 260-280  Wt Readings from Last 3 Encounters:  05/19/22 224 lb (101.6 kg)  05/14/22 224 lb 6.4 oz (101.8 kg)  12/25/21 223 lb 9.6 oz (101.4 kg)    Glycemic control:   Lab Results  Component Value Date   HGBA1C 6.0 (H) 04/30/2022   HGBA1C 5.9 (H) 12/18/2021   HGBA1C 6.0 (H) 08/07/2021   Lab Results  Component Value Date   LDLCALC 93 04/30/2022   CREATININE 0.98 04/30/2022   Other problems discussed today: See review of systems  Allergies as of 05/19/2022   No Known Allergies      Medication List        Accurate as of May 19, 2022 11:59 PM. If you have any questions, ask your nurse or doctor.          allopurinol  100 MG tablet Commonly known as: ZYLOPRIM TAKE 2 TABLETS(200 MG) BY MOUTH DAILY   atorvastatin 20 MG tablet Commonly known as: LIPITOR TAKE 1 TABLET BY MOUTH DAILY   azelastine 0.1 % nasal spray Commonly known as: ASTELIN Place 2 sprays into both nostrils at bedtime as needed for rhinitis or allergies.   Cholecalciferol 50 MCG (2000 UT) Caps Take 2 capsules by mouth daily at 6 (six) AM.   Contour Next Test test strip Generic drug: glucose blood CHECK BLOOD SUGAR THREE TIMES DAILY AS DIRECTED   Dexcom G7 Sensor Misc Change every 10 days   famotidine 20 MG tablet Commonly known as: PEPCID TAKE 1 TABLET(20 MG) BY MOUTH AT BEDTIME   fenofibrate 160 MG tablet TAKE 1 TABLET(160 MG) BY MOUTH DAILY   fluticasone 50 MCG/ACT nasal spray Commonly known as: FLONASE Place 2 sprays into both nostrils daily. What changed:  when to take this reasons to take this   icosapent Ethyl 1 g capsule Commonly known as: VASCEPA TAKE 2 CAPSULES(2 GRAMS) BY MOUTH TWICE DAILY   Jardiance 25 MG Tabs tablet Generic drug: empagliflozin TAKE 1 TABLET(25 MG) BY MOUTH DAILY   levothyroxine 137 MCG tablet Commonly known as: SYNTHROID TAKE 1 TABLET BY MOUTH DAILY BEFORE BREAKFAST   lisinopril 5 MG tablet Commonly known as: ZESTRIL TAKE 1 TABLET(5 MG) BY MOUTH DAILY   Magnesium 500 MG Tabs Take 1 tablet by mouth daily in the afternoon.   metFORMIN 1000 MG tablet Commonly known as: GLUCOPHAGE TAKE 1 TABLET(1000 MG) BY MOUTH TWICE DAILY WITH A MEAL   Microlet Lancets Misc USE TO CHECK BLOOD SUGAR THREE TIMES DAILY   pantoprazole 40 MG tablet Commonly known as: PROTONIX TAKE 1 TABLET(40 MG) BY MOUTH DAILY   SENNA-DOCUSATE SODIUM PO Take 1 tablet by mouth as needed.   Trulicity 4.5 NL/8.9QJ Sopn Generic drug: Dulaglutide INJECT 4.'5MG'$  AS DIRECTED ONCE A WEEK   VITAMIN C PO Take 2 tablets by mouth 2 (two) times daily.   Zinc 50 MG Caps Take 1 capsule by mouth daily at 6 (six) AM.         Allergies:  No Known Allergies  Past Medical History:  Diagnosis Date   Anxiety    on meds   Atrial flutter ([place])    typical appearing; s/p ablation  12-2012   Constipation    Depression    hx of   Diabetes mellitus    TYPE 2-on meds   Fatty liver    GERD (gastroesophageal reflux disease)    Gout    Hyperlipidemia    Hypothyroidism    on meds   Leg edema    Obesity    Persistent atrial fibrillation (HCC)    PVI 12/2012   Prediabetes    Rhinitis    vasomotor   S/P Minimally invasive maze operation for atrial fibrillation 03/12/2016   Complete bilateral atrial lesion set using cryothermy and bipolar radiofrequency ablation with clipping of LA appendage via right mini thoracotomy approach   S/P patent foramen ovale closure 03/12/2016   Sleep apnea    mild, now treated with CPAP since ~9-12    Past Surgical History:  Procedure Laterality Date   ABLATION OF DYSRHYTHMIC FOCUS  01/05/2013   PVI and flutter ablation by [name]   ATRIAL FIBRILLATION ABLATION N/A 01/05/2013   Procedure: ATRIAL FIBRILLATION ABLATION;  Surgeon: Bohlen Grayer, MD;  Location: [facility] CATH LAB;  Service: Cardiovascular;  Laterality: N/A;   CARDIAC CATHETERIZATION N/A 01/12/2016   Procedure: Right/Left Heart Cath and Coronary Angiography;  Surgeon: [name], MD;  Location: [place] CV LAB;  Service: Cardiovascular;  Laterality: N/A;   CARDIOVERSION  03/01/2011   CLIPPING OF ATRIAL APPENDAGE Left 03/12/2016   Procedure: CLIPPING OF ATRIAL APPENDAGE;  Surgeon: [name], MD;  Location: [place];  Service: Open Heart Surgery;  Laterality: Left;   COLONOSCOPY  2020   HD-suprep(good)   MINIMALLY INVASIVE MAZE PROCEDURE N/A 03/12/2016   Procedure: MINIMALLY INVASIVE MAZE PROCEDURE;  Surgeon: [name], MD;  Location: [place];  Service: Open Heart Surgery;  Laterality: N/A;   NASAL SINUS SURGERY     TEE WITHOUT CARDIOVERSION N/A 01/04/2013   Procedure: TRANSESOPHAGEAL ECHOCARDIOGRAM  (TEE);  Surgeon: [name], MD;  Location[facility] ENDOSCOPY;  Service: Cardiovascular;  Laterality: N/A;   TEE WITHOUT CARDIOVERSION N/A 03/12/2016   Procedure: TRANSESOPHAGEAL ECHOCARDIOGRAM (TEE);  Surgeon: [name], MD;  Location: [place];  Service: Open Heart Surgery;  Laterality: N/A;   TONSILLECTOMY     VASECTOMY     WISDOM TOOTH EXTRACTION      Family History  Problem Relation Age of Onset   Asthma Mother    Breast cancer Mother    Dementia Mother    Colon polyps Father 47   Diabetes Father    Hyperlipidemia Father    Obesity Father    Atrial fibrillation Father    Hypertension Brother    Melanoma Brother    Throat cancer Maternal Uncle    Colon polyps Paternal Grandfather 40   Colon cancer Paternal Grandfather 69   Allergies Daughter    Prostate cancer Neg Hx    Esophageal cancer Neg Hx    Stomach cancer Neg Hx    Rectal cancer Neg Hx     Social History:  reports that he has never smoked. He has never used smokeless tobacco. He reports that he does not drink alcohol and does not use drugs.    Review of Systems         Lipids:  has high triglycerides mostly previously as high as 681; LDL previously also above 100 at baseline.   On Vascepa and fenofibrate for triglycerides, also taking Lipitor 20 mg daily He has been on either Vascepa or Lovaza since about 2016 and has not had  any issues with atrial fibrillation for the last few years  Has taken fenofibrate and triglycerides are back to normal  With increasing atorvastatin to 20 mg LDL is below 100 now      . Lab Results  Component Value Date   CHOL 164 04/30/2022   CHOL 179 12/18/2021   CHOL 172 08/07/2021   Lab Results  Component Value Date   HDL 47 04/30/2022   HDL 50 12/18/2021   HDL 47 08/07/2021   Lab Results  Component Value Date   LDLCALC 93 04/30/2022   LDLCALC 105 (H) 12/18/2021   LDLCALC 99 08/07/2021   Lab Results  Component Value Date   TRIG 133 04/30/2022   TRIG 135 12/18/2021    TRIG 151 (H) 08/07/2021   Lab Results  Component Value Date   CHOLHDL 3.5 04/30/2022   CHOLHDL 3.6 12/18/2021   CHOLHDL 3.7 08/07/2021   No results found for: "LDLDIRECT"                  HYPOTHYROIDISM: He was diagnosed to have primary hypothyroidism in 1994  He is consistent with his generic levothyroxine every morning before eating  Has been on levothyroxine 137 mcg daily TSH consistently normal  Lab Results  Component Value Date   TSH 1.750 04/30/2022   TSH 1.630 12/18/2021   TSH 2.100 08/07/2021   FREET4 1.77 07/25/2020   FREET4 1.49 03/25/2020   FREET4 1.86 (H) 11/14/2019       The blood pressure has been treated by cardiologist with 5 mg lisinopril    BP Readings from Last 3 Encounters:  05/19/22 126/70  05/14/22 124/66  12/25/21 118/70     Lab Results  Component Value Date   K 4.5 04/30/2022    Vitamin D deficiency:  Has been  taking 4000 units vitamin D3 regularly Levels as follows  Lab Results  Component Value Date   VD25OH 37.3 04/30/2022   VD25OH 44.7 08/07/2021   VD25OH 44.1 11/28/2020   VD25OH 31.2 03/25/2020    HYPERCALCEMIA:  As seen below the calcium is slightly high or upper normal, also note that his albumin level is slightly high PTH level has been normal, last 26 No history of kidney stones or known osteopenia  Lab Results  Component Value Date   CALCIUM 10.3 (H) 04/30/2022   CALCIUM 10.9 (H) 12/18/2021   CALCIUM 10.3 (H) 08/07/2021   CALCIUM 10.2 04/03/2021   Lab Results  Component Value Date   PTH 26 07/11/2019   CALCIUM 10.3 (H) 04/30/2022   CAION 1.15 03/13/2016    He has been prescribed allopurinol by PCP, uric acid normal at 5.7  Asking about muscle cramps at night: This is worse in the last few weeks and very painful and only relieved by getting out of bed and stretching.  Mostly in his legs but occasionally back.  Has not discussed with PCP  Fasting testosterone level checked because of his complaints of  fatigue but it is normal   Physical Examination:  BP 126/70   Pulse 85   Ht '6\' 1"'$  (1.854 m)   Wt 224 lb (101.6 kg)   SpO2 95%   BMI 29.55 kg/m      ASSESSMENT    Diabetes type 2, with mild obesity  He is on a 3 drug regimen of metformin, Trulicity 4.5 mg and Jardiance 25 mg   See history of present illness for discussion of his current management, blood sugar patterns and problems identified.  His A1c is about the same 6% As before he tends to have relatively high fasting readings but also appear to be higher on the Dexcom compared to actual reading Weight has leveled off    HYPERPARATHYROIDISM: This is mild, asymptomatic and calcium was only mildly increased at 10.9   HYPOTHYROIDISM: He feels well TSH consistently normal with taking 137 mcg levothyroxine and he will continue the same dose   Lipids: He has mixed hyperlipidemia with adequate control of both triglycerides and LDL  Muscle cramps at night: Likely idiopathic and he can try tonic water, stretching exercises of his calf muscles to start with and follow-up with PCP if not better  Mild hypertension: Controlled only on 5 mg lisinopril Renal function stable  PLAN:   No change in Trulicity, metformin or Jardiance Consistent exercise regimen and also monitor meal planning for carbohydrates and high-fat meals  Continue to use the Dexcom G7 sensor Reassured him that his hyperparathyroidism is likely asymptomatic and will continue to monitor his calcium, no intervention needed  Continue same dose of Synthroid Atorvastatin will be continued at 20 mg No change in vitamin D supplements  Follow-up in 4 months again    There are no Patient Instructions on file for this visit.     Elayne Snare 05/20/2022, 9:36 AM   Note: This office note was prepared with Dragon voice recognition system technology. Any transcriptional errors that result from this process are unintentional.

## 2022-05-26 ENCOUNTER — Encounter: Payer: Self-pay | Admitting: Endocrinology

## 2022-06-05 ENCOUNTER — Other Ambulatory Visit: Payer: Self-pay | Admitting: Internal Medicine

## 2022-06-14 ENCOUNTER — Other Ambulatory Visit: Payer: Self-pay

## 2022-06-14 DIAGNOSIS — E782 Mixed hyperlipidemia: Secondary | ICD-10-CM

## 2022-06-14 MED ORDER — ICOSAPENT ETHYL 1 G PO CAPS: ORAL_CAPSULE | ORAL | 3 refills | Status: DC

## 2022-06-26 ENCOUNTER — Other Ambulatory Visit: Payer: Self-pay | Admitting: Endocrinology

## 2022-07-08 ENCOUNTER — Encounter (HOSPITAL_COMMUNITY): Payer: Self-pay | Admitting: *Deleted

## 2022-08-12 ENCOUNTER — Other Ambulatory Visit: Payer: Self-pay | Admitting: Internal Medicine

## 2022-08-13 ENCOUNTER — Other Ambulatory Visit (HOSPITAL_COMMUNITY): Payer: Self-pay

## 2022-08-16 ENCOUNTER — Other Ambulatory Visit (HOSPITAL_COMMUNITY): Payer: Self-pay

## 2022-08-16 ENCOUNTER — Telehealth: Payer: Self-pay

## 2022-08-16 ENCOUNTER — Encounter: Payer: Self-pay | Admitting: Endocrinology

## 2022-08-16 NOTE — Telephone Encounter (Signed)
Pharmacy Patient Advocate Encounter   Received notification from [facility] that prior authorization for [place] is needed.    PA submitted on 08/16/22 Key [id number] Status is pending  Karie Soda, [place] Patient Advocate Specialist Direct Number: [phone] Fax: [phone]

## 2022-08-16 NOTE — Telephone Encounter (Signed)
Pharmacy Patient Advocate Encounter  Prior Authorization for [place]  has been approved.    PA# [id number] Effective dates: 08/16/22 through 08/16/23  Karie Soda, [place] Patient Advocate Specialist Direct Number: [phone] Fax: [phone]

## 2022-08-24 ENCOUNTER — Other Ambulatory Visit (HOSPITAL_COMMUNITY): Payer: Self-pay

## 2022-08-30 ENCOUNTER — Other Ambulatory Visit (HOSPITAL_COMMUNITY): Payer: Self-pay

## 2022-08-30 ENCOUNTER — Telehealth: Payer: Self-pay

## 2022-08-30 NOTE — Telephone Encounter (Signed)
Patient Advocate Encounter   Received notification from [employer] that prior authorization renewal is required for Icosapent Ethyl 1GM capsules   Submitted: 08-30-2022 Key [id number]  Status is pending

## 2022-09-08 NOTE — Telephone Encounter (Signed)
Patient Advocate Encounter  Prior Authorization for Icosapent Ethyl 1GM capsules has been approved through [employer].    Key: [id number]  Effective: 08-31-2022 to 08-30-2023

## 2022-09-10 ENCOUNTER — Encounter: Payer: Self-pay | Admitting: Internal Medicine

## 2022-09-15 LAB — HM DIABETES EYE EXAM

## 2022-09-17 ENCOUNTER — Other Ambulatory Visit (INDEPENDENT_AMBULATORY_CARE_PROVIDER_SITE_OTHER): Payer: 59

## 2022-09-17 ENCOUNTER — Encounter: Payer: 59 | Admitting: Internal Medicine

## 2022-09-17 DIAGNOSIS — E790 Hyperuricemia without signs of inflammatory arthritis and tophaceous disease: Secondary | ICD-10-CM

## 2022-09-17 DIAGNOSIS — E1169 Type 2 diabetes mellitus with other specified complication: Secondary | ICD-10-CM | POA: Diagnosis not present

## 2022-09-17 DIAGNOSIS — E063 Autoimmune thyroiditis: Secondary | ICD-10-CM

## 2022-09-17 DIAGNOSIS — E669 Obesity, unspecified: Secondary | ICD-10-CM | POA: Diagnosis not present

## 2022-09-17 DIAGNOSIS — E782 Mixed hyperlipidemia: Secondary | ICD-10-CM

## 2022-09-20 ENCOUNTER — Encounter: Payer: Self-pay | Admitting: Endocrinology

## 2022-09-20 ENCOUNTER — Other Ambulatory Visit: Payer: Self-pay | Admitting: Internal Medicine

## 2022-09-20 ENCOUNTER — Other Ambulatory Visit: Payer: Self-pay

## 2022-09-20 LAB — HEMOGLOBIN A1C
Est. average glucose Bld gHb Est-mCnc: 128 mg/dL
Hgb A1c MFr Bld: 6.1 % — ABNORMAL HIGH (ref 4.8–5.6)

## 2022-09-20 LAB — COMPREHENSIVE METABOLIC PANEL
ALT: 18 IU/L (ref 0–44)
AST: 16 IU/L (ref 0–40)
Albumin/Globulin Ratio: 2.6 — ABNORMAL HIGH (ref 1.2–2.2)
Albumin: 4.9 g/dL (ref 3.9–4.9)
Alkaline Phosphatase: 62 IU/L (ref 44–121)
BUN/Creatinine Ratio: 18 (ref 10–24)
BUN: 19 mg/dL (ref 8–27)
Bilirubin Total: 0.5 mg/dL (ref 0.0–1.2)
CO2: 23 mmol/L (ref 20–29)
Calcium: 10.3 mg/dL — ABNORMAL HIGH (ref 8.6–10.2)
Chloride: 102 mmol/L (ref 96–106)
Creatinine, Ser: 1.04 mg/dL (ref 0.76–1.27)
Globulin, Total: 1.9 g/dL (ref 1.5–4.5)
Glucose: 118 mg/dL — ABNORMAL HIGH (ref 70–99)
Potassium: 5.6 mmol/L — ABNORMAL HIGH (ref 3.5–5.2)
Sodium: 140 mmol/L (ref 134–144)
Total Protein: 6.8 g/dL (ref 6.0–8.5)
eGFR: 81 mL/min/{1.73_m2} (ref >59)

## 2022-09-20 LAB — LIPID PANEL
Chol/HDL Ratio: 3.6 ratio (ref 0.0–5.0)
Cholesterol, Total: 163 mg/dL (ref 100–199)
HDL: 45 mg/dL (ref >39)
LDL Chol Calc (NIH): 99 mg/dL (ref 0–99)
Triglycerides: 106 mg/dL (ref 0–149)
VLDL Cholesterol Cal: 19 mg/dL (ref 5–40)

## 2022-09-20 LAB — TSH: TSH: 1.35 u[IU]/mL (ref 0.450–4.500)

## 2022-09-20 LAB — URIC ACID: Uric Acid: 5 mg/dL (ref 3.8–8.4)

## 2022-09-20 MED ORDER — LISINOPRIL 5 MG PO TABS: 5.0000 mg | ORAL_TABLET | Freq: Every day | ORAL | 1 refills | Status: DC

## 2022-09-24 ENCOUNTER — Encounter: Payer: Self-pay | Admitting: Endocrinology

## 2022-09-24 ENCOUNTER — Ambulatory Visit: Payer: 59 | Admitting: Endocrinology

## 2022-09-24 VITALS — BP 120/70 | HR 76 | Ht 73.0 in | Wt 222.6 lb

## 2022-09-24 DIAGNOSIS — Z7984 Long term (current) use of oral hypoglycemic drugs: Secondary | ICD-10-CM

## 2022-09-24 DIAGNOSIS — E1169 Type 2 diabetes mellitus with other specified complication: Secondary | ICD-10-CM | POA: Diagnosis not present

## 2022-09-24 DIAGNOSIS — E875 Hyperkalemia: Secondary | ICD-10-CM | POA: Diagnosis not present

## 2022-09-24 DIAGNOSIS — Z7985 Long-term (current) use of injectable non-insulin antidiabetic drugs: Secondary | ICD-10-CM | POA: Diagnosis not present

## 2022-09-24 DIAGNOSIS — E063 Autoimmune thyroiditis: Secondary | ICD-10-CM | POA: Diagnosis not present

## 2022-09-24 DIAGNOSIS — E559 Vitamin D deficiency, unspecified: Secondary | ICD-10-CM

## 2022-09-24 DIAGNOSIS — E669 Obesity, unspecified: Secondary | ICD-10-CM

## 2022-09-24 NOTE — Progress Notes (Signed)
Patient ID: Colin Wood, male   DOB: Jan 31, 1961, 62 y.o.   MRN: [id number]           Reason for Appointment: Follow-up for various problems   History of Present Illness:          Diagnosis: Type 2 diabetes mellitus, date of diagnosis: ?  2011        Past history: He was not having any symptoms at the time of diagnosis and not clear what his initial blood sugars were He was started on metformin initially and had fairly good control Subsequently with higher sugars he was given Amaryl also in addition which has been continued.  A1c was 10.6% in 2012 Subsequently in 2014 his A1c was down to 6.5 but he had a regular follow-up subsequently Because of an A1c of 8.1 in 02/2014 he was given Tradjenta in addition to the above drugs but this was not effective  He was referred here because of a high A1c of 7.9 in 07/2014  Recent history:   Oral hypoglycemic drugs the patient is taking are: metformin 1 g twice a day, Trulicity 4.5 mg weekly, Jardiance 25 mg daily      A1c is stable at 6.1, appears be lower than expected for his actual sugars  Current management, blood sugar values and problems identified: His Dexcom appears to be reading a little higher than expected for his actual blood sugars and A1c  He says his blood sugar was 150 when he had a glucose of 118 in the lab However has not used his meter at home to compare with sensor Lately has had fairly good blood sugars but still have some postprandial spikes at different times but not consistently based on diet Has been taking all his medications regularly Only rarely will be late with getting his supply of Trulicity Overall blood sugars are on an average nearly the same throughout the day and night He has done some walking for exercise, has had more activity with house projects lately         Side effects from medications have been:None  Compliance with the medical regimen: Fair   .      Interpretation of his Dexcom G6 sensor  download for the last 2 weeks shows the following  Blood sugars appear to be somewhat higher than actual readings with fasting glucose on the Dexcom about 15 mg higher than the lab glucose at the same time  OVERNIGHT blood sugars are steady averaging above 160  During the day Premeal blood sugars are also generally averaging in the 150-160 range with lowest reading average 152 mid afternoon stable with lowest readings around 7 AM averaging 146  POSTPRANDIAL readings are sporadically showing hyperglycemia after breakfast or dinner but less often after lunch Somewhat better in the last week compared to previous No hypoglycemia  CGM use % of time   2-week average/GV 168/18  Time in range      73  %  % Time Above 180 25+.  % Time above 250   % Time Below 70       Previously:  CGM use % of time   2-week average/GV 159  Time in range     76   %  % Time Above 180 23  % Time above 250   % Time Below 70      Self-care: Meal planning is fair, sometimes getting biscuits in the morning from fast food restaurants  Dietician visit, most recent: none, previously had gone to a class          Weight history: Previous range 260-280  Wt Readings from Last 3 Encounters:  09/24/22 222 lb 9.6 oz (101 kg)  05/19/22 224 lb (101.6 kg)  05/14/22 224 lb 6.4 oz (101.8 kg)    Glycemic control:   Lab Results  Component Value Date   HGBA1C 6.1 (H) 09/17/2022   HGBA1C 6.0 (H) 04/30/2022   HGBA1C 5.9 (H) 12/18/2021   Lab Results  Component Value Date   LDLCALC 99 09/17/2022   CREATININE 1.04 09/17/2022   Other problems discussed today: See review of systems  Allergies as of 09/24/2022   No Known Allergies      Medication List        Accurate as of September 24, 2022  1:26 PM. If you have any questions, ask your nurse or doctor.          allopurinol 100 MG tablet Commonly known as: ZYLOPRIM TAKE 2 TABLETS(200 MG) BY MOUTH DAILY   atorvastatin 20 MG tablet Commonly known  as: LIPITOR TAKE 1 TABLET BY MOUTH DAILY   azelastine 0.1 % nasal spray Commonly known as: ASTELIN Place 2 sprays into both nostrils at bedtime as needed for rhinitis or allergies.   Cholecalciferol 50 MCG (2000 UT) Caps Take 2 capsules by mouth daily at 6 (six) AM.   Contour Next Test test strip Generic drug: glucose blood CHECK BLOOD SUGAR THREE TIMES DAILY AS DIRECTED   Dexcom G7 Sensor Misc Change every 10 days   famotidine 20 MG tablet Commonly known as: PEPCID Take 1 tablet (20 mg total) by mouth at bedtime.   fenofibrate 160 MG tablet TAKE 1 TABLET(160 MG) BY MOUTH DAILY   fluticasone 50 MCG/ACT nasal spray Commonly known as: FLONASE Place 2 sprays into both nostrils daily. What changed:  when to take this reasons to take this   icosapent Ethyl 1 g capsule Commonly known as: VASCEPA TAKE 2 CAPSULES(2 GRAMS) BY MOUTH TWICE DAILY   Jardiance 25 MG Tabs tablet Generic drug: empagliflozin TAKE 1 TABLET(25 MG) BY MOUTH DAILY   levothyroxine 137 MCG tablet Commonly known as: SYNTHROID TAKE 1 TABLET BY MOUTH DAILY BEFORE BREAKFAST   lisinopril 5 MG tablet Commonly known as: ZESTRIL Take 1 tablet (5 mg total) by mouth daily.   Magnesium 500 MG Tabs Take 1 tablet by mouth daily in the afternoon.   metFORMIN 1000 MG tablet Commonly known as: GLUCOPHAGE TAKE 1 TABLET(1000 MG) BY MOUTH TWICE DAILY WITH A MEAL   Microlet Lancets Misc USE TO CHECK BLOOD SUGAR THREE TIMES DAILY   pantoprazole 40 MG tablet Commonly known as: PROTONIX Take 1 tablet (40 mg total) by mouth daily before breakfast.   SENNA-DOCUSATE SODIUM PO Take 1 tablet by mouth as needed.   Trulicity 4.5 MG/0.5ML Sopn Generic drug: Dulaglutide INJECT 4.5MG  AS DIRECTED ONCE A WEEK   VITAMIN C PO Take 2 tablets by mouth 2 (two) times daily.   Zinc 50 MG Caps Take 1 capsule by mouth daily at 6 (six) AM.        Allergies:  No Known Allergies  Past Medical History:  Diagnosis Date    Anxiety    on meds   Atrial flutter (HCC)    typical appearing; s/p ablation 12-2012   Constipation    Depression    hx of   Diabetes mellitus    TYPE 2-on meds   Fatty liver  GERD (gastroesophageal reflux disease)    Gout    Hyperlipidemia    Hypothyroidism    on meds   Leg edema    Obesity    Persistent atrial fibrillation (HCC)    PVI 12/2012   Prediabetes    Rhinitis    vasomotor   S/P Minimally invasive maze operation for atrial fibrillation 03/12/2016   Complete bilateral atrial lesion set using cryothermy and bipolar radiofrequency ablation with clipping of LA appendage via right mini thoracotomy approach   S/P patent foramen ovale closure 03/12/2016   Sleep apnea    mild, now treated with CPAP since ~9-12    Past Surgical History:  Procedure Laterality Date   ABLATION OF DYSRHYTHMIC FOCUS  01/05/2013   PVI and flutter ablation by Dr Johney Frame   ATRIAL FIBRILLATION ABLATION N/A 01/05/2013   Procedure: ATRIAL FIBRILLATION ABLATION;  Surgeon: Hillis Range, MD;  Location: [facility] CATH LAB;  Service: Cardiovascular;  Laterality: N/A;   CARDIAC CATHETERIZATION N/A 01/12/2016   Procedure: Right/Left Heart Cath and Coronary Angiography;  Surgeon: Yvonne Kendall, MD;  Location: [facility] INVASIVE CV LAB;  Service: Cardiovascular;  Laterality: N/A;   CARDIOVERSION  03/01/2011   CLIPPING OF ATRIAL APPENDAGE Left 03/12/2016   Procedure: CLIPPING OF ATRIAL APPENDAGE;  Surgeon: Purcell Nails, MD;  Location: [facility] OR;  Service: Open Heart Surgery;  Laterality: Left;   COLONOSCOPY  2020   HD-suprep(good)   MINIMALLY INVASIVE MAZE PROCEDURE N/A 03/12/2016   Procedure: MINIMALLY INVASIVE MAZE PROCEDURE;  Surgeon: Purcell Nails, MD;  Location: [facility] OR;  Service: Open Heart Surgery;  Laterality: N/A;   NASAL SINUS SURGERY     TEE WITHOUT CARDIOVERSION N/A 01/04/2013   Procedure: TRANSESOPHAGEAL ECHOCARDIOGRAM (TEE);  Surgeon: Pricilla Riffle, MD;  Location: [facility] ENDOSCOPY;  Service: Cardiovascular;   Laterality: N/A;   TEE WITHOUT CARDIOVERSION N/A 03/12/2016   Procedure: TRANSESOPHAGEAL ECHOCARDIOGRAM (TEE);  Surgeon: Purcell Nails, MD;  Location: [facility] OR;  Service: Open Heart Surgery;  Laterality: N/A;   TONSILLECTOMY     VASECTOMY     WISDOM TOOTH EXTRACTION      Family History  Problem Relation Age of Onset   Asthma Mother    Breast cancer Mother    Dementia Mother    Colon polyps Father 37   Diabetes Father    Hyperlipidemia Father    Obesity Father    Atrial fibrillation Father    Hypertension Brother    Melanoma Brother    Throat cancer Maternal Uncle    Colon polyps Paternal Grandfather 86   Colon cancer Paternal Grandfather 84   Allergies Daughter    Prostate cancer Neg Hx    Esophageal cancer Neg Hx    Stomach cancer Neg Hx    Rectal cancer Neg Hx     Social History:  reports that he has never smoked. He has never used smokeless tobacco. He reports that he does not drink alcohol and does not use drugs.    Review of Systems         Lipids:  has high triglycerides mostly previously as high as 681; LDL previously also above 956 at baseline.   On Vascepa and fenofibrate for triglycerides, also taking Lipitor 20 mg daily He has been on either Vascepa or Lovaza since about 2016 and has not had any issues with atrial fibrillation for the last few years  Has taken fenofibrate and triglycerides are back to normal  With increasing atorvastatin to 20 mg LDL is below  100 now      . Lab Results  Component Value Date   CHOL 163 09/17/2022   CHOL 164 04/30/2022   CHOL 179 12/18/2021   Lab Results  Component Value Date   HDL 45 09/17/2022   HDL 47 04/30/2022   HDL 50 12/18/2021   Lab Results  Component Value Date   LDLCALC 99 09/17/2022   LDLCALC 93 04/30/2022   LDLCALC 105 (H) 12/18/2021   Lab Results  Component Value Date   TRIG 106 09/17/2022   TRIG 133 04/30/2022   TRIG 135 12/18/2021   Lab Results  Component Value Date   CHOLHDL 3.6 09/17/2022    CHOLHDL 3.5 04/30/2022   CHOLHDL 3.6 12/18/2021   No results found for: "LDLDIRECT"                  HYPOTHYROIDISM: He was diagnosed to have primary hypothyroidism in 1994  He is consistent with his generic levothyroxine every morning before eating  Has been on levothyroxine 137 mcg daily TSH consistently normal  Lab Results  Component Value Date   TSH 1.350 09/17/2022   TSH 1.750 04/30/2022   TSH 1.630 12/18/2021   FREET4 1.77 07/25/2020   FREET4 1.49 03/25/2020   FREET4 1.86 (H) 11/14/2019       The blood pressure has been treated by cardiologist with 5 mg lisinopril    BP Readings from Last 3 Encounters:  09/24/22 120/70  05/19/22 126/70  05/14/22 124/66     Lab Results  Component Value Date   K 5.6 (H) 09/17/2022    Vitamin D deficiency:  Has been  taking 4000 units vitamin D3 regularly Levels as follows  Lab Results  Component Value Date   VD25OH 37.3 04/30/2022   VD25OH 44.7 08/07/2021   VD25OH 44.1 11/28/2020   VD25OH 31.2 03/25/2020    HYPERCALCEMIA:  As seen below the calcium is slightly high or upper normal, also note that his albumin level is slightly high PTH level has been normal, last 26 No history of kidney stones or known osteopenia  Lab Results  Component Value Date   CALCIUM 10.3 (H) 09/17/2022   CALCIUM 10.3 (H) 04/30/2022   CALCIUM 10.9 (H) 12/18/2021   CALCIUM 10.3 (H) 08/07/2021   Lab Results  Component Value Date   PTH 26 07/11/2019   CALCIUM 10.3 (H) 09/17/2022   CAION 1.15 03/13/2016    He has been prescribed allopurinol by PCP, uric acid normal at 5.7  Asking about muscle cramps at night: This is worse in the last few weeks and very painful and only relieved by getting out of bed and stretching.  Mostly in his legs but occasionally back.  Has not discussed with PCP  Fasting testosterone level checked because of his complaints of fatigue but it is normal   Physical Examination:  BP 120/70 (BP Location: Left  Arm, Patient Position: Sitting, Cuff Size: Normal)   Pulse 76   Ht 6\' 1"  (1.854 m)   Wt 222 lb 9.6 oz (101 kg)   SpO2 96%   BMI 29.37 kg/m     Diabetic Foot Exam - Simple   Simple Foot Form Diabetic Foot exam was performed with the following findings: Yes   Visual Inspection No deformities, no ulcerations, no other skin breakdown bilaterally: Yes Sensation Testing Intact to touch and monofilament testing bilaterally: Yes Pulse Check Posterior Tibialis and Dorsalis pulse intact bilaterally: Yes Comments     ASSESSMENT    Diabetes type 2,  with recent BMI 29  He is on a 3 drug regimen of metformin, Trulicity 4.5 mg and Jardiance 25 mg   See history of present illness for discussion of his current management, blood sugar patterns and problems identified.   His A1c is about the same 6.1 % His Dexcom appears to be reading falsely high Both fasting and some postprandial readings are higher than expected However he goes off his diet with eating biscuits sometimes in the morning and this will raise his blood sugars sometimes over 250 Although his weight has been maintained he has not done much formal exercise Will continue his medications unchanged He can try and be more diligent with improving his diet Restart walking program when able to He will calibrate his Dexcom and if not able to get accurate readings consider freestyle libre   HYPERKALEMIA: Likely lab error and will repeat  HYPERPARATHYROIDISM: This is mild, asymptomatic and calcium just above normal range again   HYPOTHYROIDISM: He feels well TSH consistently normal with taking 137 mcg levothyroxine   Lipids: He has mixed hyperlipidemia with LDL borderline at 99, however adequate considering his risk factors and already being on medium intensity statin  Mild hypertension: Controlled consistently with 5 mg lisinopril  Needs urine microalbumin checked    PLAN:   No change in Trulicity, metformin or  Jardiance Likely continue Verio with certain foods like biscuits at breakfast to prevent postprandial hyperglycemia Also may be able to start back on formal exercise again with walking briskly  Validate the accuracy of his Dexcom sensor and see if calibration will help improve the falsely high readings otherwise consider freestyle libre Recheck potassium as discussed above  Continue same dose of Synthroid Atorvastatin will be continued at 20 mg No change in allopurinol recommended  Follow-up in 4 months again    There are no Patient Instructions on file for this visit.     Reather Littler 09/24/2022, 1:26 PM   Note: This office note was prepared with Dragon voice recognition system technology. Any transcriptional errors that result from this process are unintentional.

## 2022-09-25 ENCOUNTER — Other Ambulatory Visit: Payer: Self-pay | Admitting: Endocrinology

## 2022-09-25 DIAGNOSIS — E1169 Type 2 diabetes mellitus with other specified complication: Secondary | ICD-10-CM

## 2022-09-25 LAB — POTASSIUM: Potassium: 4.5 mmol/L (ref 3.5–5.2)

## 2022-09-25 LAB — MICROALBUMIN / CREATININE URINE RATIO
Creatinine, Urine: 78.8 mg/dL
Microalb/Creat Ratio: <4 mg/g creat (ref 0–29)
Microalbumin, Urine: <3 ug/mL

## 2022-09-27 ENCOUNTER — Ambulatory Visit (INDEPENDENT_AMBULATORY_CARE_PROVIDER_SITE_OTHER): Payer: 59 | Admitting: Internal Medicine

## 2022-09-27 ENCOUNTER — Encounter: Payer: Self-pay | Admitting: Internal Medicine

## 2022-09-27 VITALS — BP 132/68 | HR 80 | Temp 97.9°F | Resp 16 | Ht 73.0 in | Wt 227.1 lb

## 2022-09-27 DIAGNOSIS — E781 Pure hyperglyceridemia: Secondary | ICD-10-CM | POA: Diagnosis not present

## 2022-09-27 DIAGNOSIS — E119 Type 2 diabetes mellitus without complications: Secondary | ICD-10-CM | POA: Diagnosis not present

## 2022-09-27 DIAGNOSIS — Z0000 Encounter for general adult medical examination without abnormal findings: Secondary | ICD-10-CM | POA: Diagnosis not present

## 2022-09-27 DIAGNOSIS — I10 Essential (primary) hypertension: Secondary | ICD-10-CM

## 2022-09-27 DIAGNOSIS — E038 Other specified hypothyroidism: Secondary | ICD-10-CM | POA: Diagnosis not present

## 2022-09-27 NOTE — Progress Notes (Unsigned)
Subjective:    Patient ID: Colin Wood, male    DOB: [birth year], 62 y.o.   MRN: [id number]  DOS:  09/27/2022 Type of visit - description: CPX  Here for CPX. Other than his nails being  thin  and brittle he has no concerns.  Wt Readings from Last 3 Encounters:  09/27/22 227 lb 2 oz (103 kg)  09/24/22 222 lb 9.6 oz (101 kg)  05/19/22 224 lb (101.6 kg)     Review of Systems  Other than above, a 14 point review of systems is negative    Past Medical History:  Diagnosis Date   Anxiety    on meds   Atrial flutter (HCC)    typical appearing; s/p ablation 12-2012   Constipation    Depression    hx of   Diabetes mellitus    TYPE 2-on meds   Fatty liver    GERD (gastroesophageal reflux disease)    Gout    Hyperlipidemia    Hypothyroidism    on meds   Leg edema    Obesity    Persistent atrial fibrillation (HCC)    PVI 12/2012   Prediabetes    Rhinitis    vasomotor   S/P Minimally invasive maze operation for atrial fibrillation 03/12/2016   Complete bilateral atrial lesion set using cryothermy and bipolar radiofrequency ablation with clipping of LA appendage via right mini thoracotomy approach   S/P patent foramen ovale closure 03/12/2016   Sleep apnea    mild, now treated with CPAP since ~9-12    Past Surgical History:  Procedure Laterality Date   ABLATION OF DYSRHYTHMIC FOCUS  01/05/2013   PVI and flutter ablation by Dr Johney Frame   ATRIAL FIBRILLATION ABLATION N/A 01/05/2013   Procedure: ATRIAL FIBRILLATION ABLATION;  Surgeon: Hillis Range, MD;  Location: [facility] CATH LAB;  Service: Cardiovascular;  Laterality: N/A;   CARDIAC CATHETERIZATION N/A 01/12/2016   Procedure: Right/Left Heart Cath and Coronary Angiography;  Surgeon: Yvonne Kendall, MD;  Location: [facility] INVASIVE CV LAB;  Service: Cardiovascular;  Laterality: N/A;   CARDIOVERSION  03/01/2011   CLIPPING OF ATRIAL APPENDAGE Left 03/12/2016   Procedure: CLIPPING OF ATRIAL APPENDAGE;  Surgeon: Purcell Nails, MD;   Location: [facility] OR;  Service: Open Heart Surgery;  Laterality: Left;   COLONOSCOPY  2020   HD-suprep(good)   MINIMALLY INVASIVE MAZE PROCEDURE N/A 03/12/2016   Procedure: MINIMALLY INVASIVE MAZE PROCEDURE;  Surgeon: Purcell Nails, MD;  Location: [facility] OR;  Service: Open Heart Surgery;  Laterality: N/A;   NASAL SINUS SURGERY     TEE WITHOUT CARDIOVERSION N/A 01/04/2013   Procedure: TRANSESOPHAGEAL ECHOCARDIOGRAM (TEE);  Surgeon: Pricilla Riffle, MD;  Location: [facility] ENDOSCOPY;  Service: Cardiovascular;  Laterality: N/A;   TEE WITHOUT CARDIOVERSION N/A 03/12/2016   Procedure: TRANSESOPHAGEAL ECHOCARDIOGRAM (TEE);  Surgeon: Purcell Nails, MD;  Location: [facility] OR;  Service: Open Heart Surgery;  Laterality: N/A;   TONSILLECTOMY     VASECTOMY     WISDOM TOOTH EXTRACTION     Social History   Socioeconomic History   Marital status: Married    Spouse name: Elease Hashimoto   Number of children: 2   Years of education: Not on file   Highest education level: Not on file  Occupational History   Occupation: [employer]  Tobacco Use   Smoking status: Never   Smokeless tobacco: Never  Vaping Use   Vaping Use: Never used  Substance and Sexual Activity   Alcohol use: No  Drug use: No   Sexual activity: Not on file  Other Topics Concern   Not on file  Social History Narrative   Pt lives in [place] with spouse and 1 child   2 children  1988, 2000 .     Works in Consulting civil engineer at [employer].   Social Determinants of Corporate investment banker Strain: Not on [employer] Insecurity: Not on file  Transportation Needs: Not on file  Physical Activity: Not on file  Stress: Not on file  Social Connections: Not on file  Intimate Partner Violence: Not on file     Current Outpatient Medications  Medication Instructions   allopurinol (ZYLOPRIM) 100 MG tablet TAKE 2 TABLETS(200 MG) BY MOUTH DAILY   Ascorbic Acid (VITAMIN C PO) 2 tablets, Oral, 2 times daily   atorvastatin (LIPITOR) 20 MG tablet TAKE 1 TABLET BY MOUTH  DAILY   azelastine (ASTELIN) 0.1 % nasal spray 2 sprays, Each Nare, At bedtime PRN   Cholecalciferol 50 MCG (2000 UT) CAPS 2 capsules, Oral, Daily   Continuous Blood Gluc Sensor (DEXCOM G7 SENSOR) MISC Change every 10 days   Dulaglutide (TRULICITY) 4.5 MG/0.5ML SOPN INJECT 4.5 MG AS DIRECTED ONCE A WEEK.   famotidine (PEPCID) 20 mg, Oral, Daily at bedtime   fenofibrate 160 MG tablet TAKE 1 TABLET(160 MG) BY MOUTH DAILY   fluticasone (FLONASE) 50 MCG/ACT nasal spray 2 sprays, Each Nare, Daily   glucose blood (CONTOUR NEXT TEST) test strip CHECK BLOOD SUGAR THREE TIMES DAILY AS DIRECTED   icosapent Ethyl (VASCEPA) 1 g capsule TAKE 2 CAPSULES(2 GRAMS) BY MOUTH TWICE DAILY   JARDIANCE 25 MG TABS tablet TAKE 1 TABLET(25 MG) BY MOUTH DAILY   levothyroxine (SYNTHROID) 137 MCG tablet TAKE 1 TABLET BY MOUTH DAILY BEFORE BREAKFAST   lisinopril (ZESTRIL) 5 mg, Oral, Daily   Magnesium 500 MG TABS 1 tablet, Oral, Daily   metFORMIN (GLUCOPHAGE) 1000 MG tablet TAKE 1 TABLET(1000 MG) BY MOUTH TWICE DAILY WITH A MEAL   Microlet Lancets MISC USE TO CHECK BLOOD SUGAR THREE TIMES DAILY   pantoprazole (PROTONIX) 40 mg, Oral, Daily before breakfast   Sennosides-Docusate Sodium (SENNA-DOCUSATE SODIUM PO) 1 tablet, Oral, As needed   Zinc 50 MG CAPS 1 capsule, Oral, Daily       Objective:   Physical Exam BP 132/68   Pulse 80   Temp 97.9 F (36.6 C) (Oral)   Resp 16   Ht 6\' 1"  (1.854 m)   Wt 227 lb 2 oz (103 kg)   SpO2 97%   BMI 29.97 kg/m  General: Well developed, NAD, BMI noted Neck: No  thyromegaly  HEENT:  Normocephalic . Face symmetric, atraumatic Lungs:  CTA B Normal respiratory effort, no intercostal retractions, no accessory muscle use. Heart: RRR,  no murmur.  Abdomen:  Not distended, soft, non-tender. No rebound or rigidity.   Lower extremities: no pretibial edema bilaterally  Skin: Exposed areas without rash. Not pale. Not jaundice Nails: Hand nails normal to inspection  palpation Neurologic:  alert & oriented X3.  Speech normal, gait appropriate for age and unassisted Strength symmetric and appropriate for age.  Psych: Cognition and judgment appear intact.  Cooperative with normal attention span and concentration.  Behavior appropriate. No anxious or depressed appearing.     Assessment   Assessment  DM Dr. Lucianne Muss since 07-2014 Hyperlipidemia Hypothyroidism Hyperparathyroidism A flutter, ablation 12-2012, then  Paroxysmal Afib, s/p Maze-PFO closure-At[place]lip 02-2016, not anticoagulated GERD Depression Obesity Gout [name], CPAP since 2012;  d/c after wt loss 2019  PLAN Here for CPX DM: Per Endo Hyperlipidemia: On 3 drugs, well-controlled Hypothyroidism: Per Endo History of a flutter: LOV with the A Fib clinic 04-2022 "Patient appears to be maintaining SR. Off xarelto with chadsvasc score of  1 and LAA clip" RTC 1 year

## 2022-09-27 NOTE — Patient Instructions (Addendum)
Vaccines I recommend: Covid booster if not done by 01-2022 RSV vaccine Flu shot every fall     GO TO THE LAB : Get the blood work     GO TO THE FRONT DESK, PLEASE SCHEDULE YOUR APPOINTMENTS Come back for a physical exam in 1 year    "Health Care Power of attorney" ,  "Living will" (Advance care planning documents)  If you already have a living will or healthcare power of attorney, is recommended you bring the copy to be scanned in your chart.   The document will be available to all the doctors you see in the system.  Advance care planning is a process that supports adults in  understanding and sharing their preferences regarding future medical care.  The patient's preferences are recorded in documents called Advance Directives and the can be modified at any time while the patient is in full mental capacity.   If you don't have one, please consider create one.      More information at: [email or web address]

## 2022-09-28 ENCOUNTER — Encounter: Payer: Self-pay | Admitting: Internal Medicine

## 2022-09-28 LAB — CBC WITH DIFFERENTIAL/PLATELET
Basophils Absolute: 0 10*3/uL (ref 0.0–0.2)
Basos: 1 %
EOS (ABSOLUTE): 0.2 10*3/uL (ref 0.0–0.4)
Eos: 3 %
Hematocrit: 43.1 % (ref 37.5–51.0)
Hemoglobin: 14.6 g/dL (ref 13.0–17.7)
Immature Grans (Abs): 0 10*3/uL (ref 0.0–0.1)
Immature Granulocytes: 0 %
Lymphocytes Absolute: 1.8 10*3/uL (ref 0.7–3.1)
Lymphs: 34 %
MCH: 28.6 pg (ref 26.6–33.0)
MCHC: 33.9 g/dL (ref 31.5–35.7)
MCV: 85 fL (ref 79–97)
Monocytes Absolute: 0.5 10*3/uL (ref 0.1–0.9)
Monocytes: 10 %
Neutrophils Absolute: 2.8 10*3/uL (ref 1.4–7.0)
Neutrophils: 52 %
Platelets: 237 10*3/uL (ref 150–450)
RBC: 5.1 x10E6/uL (ref 4.14–5.80)
RDW: 12.5 % (ref 11.6–15.4)
WBC: 5.4 10*3/uL (ref 3.4–10.8)

## 2022-09-28 LAB — PSA: Prostate Specific Ag, Serum: 2.2 ng/mL (ref 0.0–4.0)

## 2022-09-28 NOTE — Assessment & Plan Note (Signed)
-  Td  2018 -pnm 23:  2015; prevnar 2016 -shingrex x2 -Vaccines are recommended RSV, Covid booster if not done by 01-2022, flu shot every fall   -CCS: Cscope 08-2005: int.  hemorrhoids, Cscope 05/2018, cscope 05/2021, no polyps next 2028 per Cscope report -Prostate cancer screening: no sxs , check PSA -Labs reviewed:  CBC, PSA -Lifestyle: Patient plans to go back to a healthier diet and increase physical activity - ACP information provided

## 2022-09-28 NOTE — Assessment & Plan Note (Signed)
Here for CPX DM: Per Endo Hyperlipidemia: On 3 drugs, well-controlled Hypothyroidism: Per Endo History of a flutter: LOV with the A Fib clinic 04-2022 "Patient appears to be maintaining SR. Off xarelto with chadsvasc score of  1 and LAA clip" RTC 1 year

## 2022-10-05 ENCOUNTER — Other Ambulatory Visit: Payer: Self-pay

## 2022-10-05 DIAGNOSIS — E063 Autoimmune thyroiditis: Secondary | ICD-10-CM

## 2022-10-05 DIAGNOSIS — E1169 Type 2 diabetes mellitus with other specified complication: Secondary | ICD-10-CM

## 2022-10-05 MED ORDER — LEVOTHYROXINE SODIUM 137 MCG PO TABS: 137.0000 ug | ORAL_TABLET | Freq: Every day | ORAL | 3 refills | Status: DC

## 2022-10-11 ENCOUNTER — Encounter: Payer: Self-pay | Admitting: Endocrinology

## 2022-10-11 DIAGNOSIS — E1169 Type 2 diabetes mellitus with other specified complication: Secondary | ICD-10-CM

## 2022-10-13 MED ORDER — SEMAGLUTIDE(0.25 OR 0.5MG/DOS) 2 MG/3ML ~~LOC~~ SOPN: 0.5000 mg | PEN_INJECTOR | SUBCUTANEOUS | 0 refills | Status: DC

## 2022-10-21 ENCOUNTER — Encounter: Payer: Self-pay | Admitting: Endocrinology

## 2022-11-07 ENCOUNTER — Other Ambulatory Visit: Payer: Self-pay

## 2022-11-07 DIAGNOSIS — E1169 Type 2 diabetes mellitus with other specified complication: Secondary | ICD-10-CM

## 2022-11-07 MED ORDER — METFORMIN HCL 1000 MG PO TABS: ORAL_TABLET | ORAL | 1 refills | Status: DC

## 2022-11-10 ENCOUNTER — Other Ambulatory Visit: Payer: Self-pay | Admitting: Endocrinology

## 2022-11-10 ENCOUNTER — Other Ambulatory Visit: Payer: Self-pay | Admitting: Internal Medicine

## 2022-11-10 DIAGNOSIS — E1169 Type 2 diabetes mellitus with other specified complication: Secondary | ICD-10-CM

## 2022-12-10 ENCOUNTER — Encounter: Payer: Self-pay | Admitting: Endocrinology

## 2022-12-10 ENCOUNTER — Other Ambulatory Visit: Payer: Self-pay

## 2022-12-10 DIAGNOSIS — E1169 Type 2 diabetes mellitus with other specified complication: Secondary | ICD-10-CM

## 2022-12-10 MED ORDER — EMPAGLIFLOZIN 25 MG PO TABS: 25.0000 mg | ORAL_TABLET | Freq: Every day | ORAL | 1 refills | Status: DC

## 2022-12-20 ENCOUNTER — Other Ambulatory Visit: Payer: Self-pay | Admitting: Endocrinology

## 2022-12-20 ENCOUNTER — Encounter: Payer: Self-pay | Admitting: Endocrinology

## 2022-12-20 DIAGNOSIS — E669 Obesity, unspecified: Secondary | ICD-10-CM

## 2022-12-21 ENCOUNTER — Other Ambulatory Visit: Payer: Self-pay

## 2022-12-21 DIAGNOSIS — E1169 Type 2 diabetes mellitus with other specified complication: Secondary | ICD-10-CM

## 2022-12-21 MED ORDER — OZEMPIC (0.25 OR 0.5 MG/DOSE) 2 MG/3ML ~~LOC~~ SOPN
2.0000 mg | PEN_INJECTOR | SUBCUTANEOUS | 0 refills | Status: DC
Start: 2022-12-21 — End: 2022-12-27

## 2022-12-27 ENCOUNTER — Other Ambulatory Visit: Payer: Self-pay

## 2022-12-27 DIAGNOSIS — E1169 Type 2 diabetes mellitus with other specified complication: Secondary | ICD-10-CM

## 2022-12-27 MED ORDER — OZEMPIC (0.25 OR 0.5 MG/DOSE) 2 MG/3ML ~~LOC~~ SOPN
2.0000 mg | PEN_INJECTOR | SUBCUTANEOUS | 0 refills | Status: DC
Start: 2022-12-27 — End: 2023-01-14

## 2022-12-29 ENCOUNTER — Encounter: Payer: Self-pay | Admitting: Endocrinology

## 2023-01-03 ENCOUNTER — Other Ambulatory Visit: Payer: Self-pay | Admitting: Internal Medicine

## 2023-01-03 ENCOUNTER — Other Ambulatory Visit: Payer: Self-pay | Admitting: Endocrinology

## 2023-01-07 ENCOUNTER — Other Ambulatory Visit: Payer: 59

## 2023-01-14 ENCOUNTER — Ambulatory Visit: Payer: 59 | Admitting: Endocrinology

## 2023-01-14 ENCOUNTER — Other Ambulatory Visit: Payer: Self-pay

## 2023-01-14 ENCOUNTER — Encounter: Payer: Self-pay | Admitting: Endocrinology

## 2023-01-14 VITALS — BP 108/70 | HR 70 | Ht 73.0 in | Wt 226.4 lb

## 2023-01-14 DIAGNOSIS — I10 Essential (primary) hypertension: Secondary | ICD-10-CM

## 2023-01-14 DIAGNOSIS — Z7985 Long-term (current) use of injectable non-insulin antidiabetic drugs: Secondary | ICD-10-CM | POA: Diagnosis not present

## 2023-01-14 DIAGNOSIS — Z7984 Long term (current) use of oral hypoglycemic drugs: Secondary | ICD-10-CM

## 2023-01-14 DIAGNOSIS — E1165 Type 2 diabetes mellitus with hyperglycemia: Secondary | ICD-10-CM

## 2023-01-14 DIAGNOSIS — E782 Mixed hyperlipidemia: Secondary | ICD-10-CM

## 2023-01-14 MED ORDER — CONTOUR NEXT TEST VI STRP: ORAL_STRIP | 3 refills | Status: AC

## 2023-01-14 MED ORDER — OZEMPIC (1 MG/DOSE) 4 MG/3ML ~~LOC~~ SOPN: PEN_INJECTOR | SUBCUTANEOUS | 0 refills | Status: DC

## 2023-01-14 NOTE — Progress Notes (Unsigned)
Patient ID: Colin Wood, male   DOB: 05/17/1961, 62 y.o.   MRN: [id number]           Reason for Appointment: Follow-up for various problems   History of Present Illness:          Diagnosis: Type 2 diabetes mellitus, date of diagnosis: ?  2011        Past history: He was not having any symptoms at the time of diagnosis and not clear what his initial blood sugars were He was started on metformin initially and had fairly good control Subsequently with higher sugars he was given Amaryl also in addition which has been continued.  A1c was 10.6% in 2012 Subsequently in 2014 his A1c was down to 6.5 but he had a regular follow-up subsequently Because of an A1c of 8.1 in 02/2014 he was given Tradjenta in addition to the above drugs but this was not effective  He was referred here because of a high A1c of 7.9 in 07/2014  Recent history:   Oral hypoglycemic drugs the patient is taking are: metformin 1 g twice a day, Trulicity 4.5 mg weekly, Jardiance 25 mg daily      A1c is stable at 6.1, appears be lower than expected for his actual sugars  Current management, blood sugar values and problems identified: His Dexcom appears to be reading a little higher than expected for his actual blood sugars and A1c  He says his blood sugar was 150 when he had a glucose of 118 in the lab However has not used his meter at home to compare with sensor Lately has had fairly good blood sugars but still have some postprandial spikes at different times but not consistently based on diet Has been taking all his medications regularly Only rarely will be late with getting his supply of Trulicity Overall blood sugars are on an average nearly the same throughout the day and night He has notdone some walking for exercise,   has had more activity with house projects lately         Side effects from medications have been:None  Compliance with the medical regimen: Fair   .      Interpretation of his Dexcom G6  sensor download for the last 2 weeks shows the following  Blood sugars appear to be somewhat higher than actual readings with fasting glucose on the Dexcom about 15 mg higher than the lab glucose at the same time  OVERNIGHT blood sugars are steady averaging above 160  During the day Premeal blood sugars are also generally averaging in the 150-160 range with lowest reading average 152 mid afternoon stable with lowest readings around 7 AM averaging 146  POSTPRANDIAL readings are sporadically showing hyperglycemia after breakfast or dinner but less often after lunch Somewhat better in the last week compared to previous No hypoglycemia  CGM use % of time   2-week average/GV 168/18  Time in range      73  %  % Time Above 180 25+.  % Time above 250   % Time Below 70      Self-care: Meal planning is fair, sometimes getting biscuits in the morning from fast food restaurants          Dietician visit, most recent: none, previously had gone to a class          Weight history: Previous range 260-280  Wt Readings from Last 3 Encounters:  01/14/23 226 lb 6.4 oz (102.7 kg)  09/27/22  227 lb 2 oz (103 kg)  09/24/22 222 lb 9.6 oz (101 kg)    Glycemic control:   Lab Results  Component Value Date   HGBA1C 6.2 (H) 01/03/2023   HGBA1C 6.1 (H) 09/17/2022   HGBA1C 6.0 (H) 04/30/2022   Lab Results  Component Value Date   LDLCALC 103 (H) 01/03/2023   CREATININE 1.03 01/03/2023   Other problems discussed today: See review of systems  Allergies as of 01/14/2023   No Known Allergies      Medication List        Accurate as of January 14, 2023 11:09 AM. If you have any questions, ask your nurse or doctor.          allopurinol 100 MG tablet Commonly known as: ZYLOPRIM Take 2 tablets (200 mg total) by mouth daily.   atorvastatin 20 MG tablet Commonly known as: LIPITOR TAKE 1 TABLET BY MOUTH DAILY   azelastine 0.1 % nasal spray Commonly known as: ASTELIN Place 2 sprays into both  nostrils at bedtime as needed for rhinitis or allergies.   Cholecalciferol 50 MCG (2000 UT) Caps Take 2 capsules by mouth daily at 6 (six) AM.   Contour Next Test test strip Generic drug: glucose blood CHECK BLOOD SUGAR THREE TIMES DAILY AS DIRECTED   Dexcom G7 Sensor Misc Change every 10 days   empagliflozin 25 MG Tabs tablet Commonly known as: Jardiance Take 1 tablet (25 mg total) by mouth daily.   famotidine 20 MG tablet Commonly known as: PEPCID Take 1 tablet (20 mg total) by mouth at bedtime.   fenofibrate 160 MG tablet TAKE 1 TABLET(160 MG) BY MOUTH DAILY   fluticasone 50 MCG/ACT nasal spray Commonly known as: FLONASE Place 2 sprays into both nostrils daily. What changed:  when to take this reasons to take this   icosapent Ethyl 1 g capsule Commonly known as: VASCEPA TAKE 2 CAPSULES(2 GRAMS) BY MOUTH TWICE DAILY   levothyroxine 137 MCG tablet Commonly known as: SYNTHROID Take 1 tablet (137 mcg total) by mouth daily before breakfast.   lisinopril 5 MG tablet Commonly known as: ZESTRIL Take 1 tablet (5 mg total) by mouth daily.   Magnesium 500 MG Tabs Take 1 tablet by mouth daily in the afternoon.   metFORMIN 1000 MG tablet Commonly known as: GLUCOPHAGE TAKE 1 TABLET(1000 MG) BY MOUTH TWICE DAILY WITH A MEAL   Microlet Lancets Misc USE TO CHECK BLOOD SUGAR THREE TIMES DAILY   Ozempic (0.25 or 0.5 MG/DOSE) 2 MG/3ML Sopn Generic drug: Semaglutide(0.25 or 0.5MG /DOS) Inject 2 mg as directed once a week.   pantoprazole 40 MG tablet Commonly known as: PROTONIX TAKE 1 TABLET(40 MG) BY MOUTH DAILY BEFORE BREAKFAST   SENNA-DOCUSATE SODIUM PO Take 1 tablet by mouth as needed.   Trulicity 4.5 MG/0.5ML Sopn Generic drug: Dulaglutide INJECT 4.5 MG AS DIRECTED ONCE A WEEK.   VITAMIN C PO Take 2 tablets by mouth 2 (two) times daily.   Zinc 50 MG Caps Take 1 capsule by mouth daily at 6 (six) AM.        Allergies:  No Known Allergies  Past Medical  History:  Diagnosis Date   Anxiety    on meds   Atrial flutter (HCC)    typical appearing; s/p ablation 12-2012   Constipation    Depression    hx of   Diabetes mellitus    TYPE 2-on meds   Fatty liver    GERD (gastroesophageal reflux disease)    Gout  Hyperlipidemia    Hypothyroidism    on meds   Leg edema    Obesity    Persistent atrial fibrillation (HCC)    PVI 12/2012   Prediabetes    Rhinitis    vasomotor   S/P Minimally invasive maze operation for atrial fibrillation 03/12/2016   Complete bilateral atrial lesion set using cryothermy and bipolar radiofrequency ablation with clipping of LA appendage via right mini thoracotomy approach   S/P patent foramen ovale closure 03/12/2016   Sleep apnea    mild, now treated with CPAP since ~9-12    Past Surgical History:  Procedure Laterality Date   ABLATION OF DYSRHYTHMIC FOCUS  01/05/2013   PVI and flutter ablation by Dr Johney Frame   ATRIAL FIBRILLATION ABLATION N/A 01/05/2013   Procedure: ATRIAL FIBRILLATION ABLATION;  Surgeon: Hillis Range, MD;  Location: [facility] CATH LAB;  Service: Cardiovascular;  Laterality: N/A;   CARDIAC CATHETERIZATION N/A 01/12/2016   Procedure: Right/Left Heart Cath and Coronary Angiography;  Surgeon: Yvonne Kendall, MD;  Location: [facility] INVASIVE CV LAB;  Service: Cardiovascular;  Laterality: N/A;   CARDIOVERSION  03/01/2011   CLIPPING OF ATRIAL APPENDAGE Left 03/12/2016   Procedure: CLIPPING OF ATRIAL APPENDAGE;  Surgeon: Purcell Nails, MD;  Location: [facility] OR;  Service: Open Heart Surgery;  Laterality: Left;   COLONOSCOPY  2020   HD-suprep(good)   MINIMALLY INVASIVE MAZE PROCEDURE N/A 03/12/2016   Procedure: MINIMALLY INVASIVE MAZE PROCEDURE;  Surgeon: Purcell Nails, MD;  Location: [facility] OR;  Service: Open Heart Surgery;  Laterality: N/A;   NASAL SINUS SURGERY     TEE WITHOUT CARDIOVERSION N/A 01/04/2013   Procedure: TRANSESOPHAGEAL ECHOCARDIOGRAM (TEE);  Surgeon: Pricilla Riffle, MD;  Location: [facility] ENDOSCOPY;   Service: Cardiovascular;  Laterality: N/A;   TEE WITHOUT CARDIOVERSION N/A 03/12/2016   Procedure: TRANSESOPHAGEAL ECHOCARDIOGRAM (TEE);  Surgeon: Purcell Nails, MD;  Location: [facility] OR;  Service: Open Heart Surgery;  Laterality: N/A;   TONSILLECTOMY     VASECTOMY     WISDOM TOOTH EXTRACTION      Family History  Problem Relation Age of Onset   Asthma Mother    Breast cancer Mother    Dementia Mother    Colon polyps Father 45   Diabetes Father    Hyperlipidemia Father    Obesity Father    Atrial fibrillation Father    Hypertension Brother    Melanoma Brother    Throat cancer Maternal Uncle    Colon polyps Paternal Grandfather 107   Colon cancer Paternal Grandfather 38   Allergies Daughter    Prostate cancer Neg Hx    Esophageal cancer Neg Hx    Stomach cancer Neg Hx    Rectal cancer Neg Hx     Social History:  reports that he has never smoked. He has never used smokeless tobacco. He reports that he does not drink alcohol and does not use drugs.    Review of Systems         Lipids:  has high triglycerides mostly previously as high as 681; LDL previously also above 301 at baseline.   On Vascepa and fenofibrate for triglycerides, also taking Lipitor 20 mg daily He has been on either Vascepa or Lovaza since about 2016 and has not had any issues with atrial fibrillation for the last few years  Has taken fenofibrate and triglycerides are back to normal  With increasing atorvastatin to 20 mg LDL is below 100 now      . Lab Results  Component Value Date   CHOL 185 01/03/2023   CHOL 163 09/17/2022   CHOL 164 04/30/2022   Lab Results  Component Value Date   HDL 46 01/03/2023   HDL 45 09/17/2022   HDL 47 04/30/2022   Lab Results  Component Value Date   LDLCALC 103 (H) 01/03/2023   LDLCALC 99 09/17/2022   LDLCALC 93 04/30/2022   Lab Results  Component Value Date   TRIG 207 (H) 01/03/2023   TRIG 106 09/17/2022   TRIG 133 04/30/2022   Lab Results  Component Value  Date   CHOLHDL 3.6 09/17/2022   CHOLHDL 3.5 04/30/2022   CHOLHDL 3.6 12/18/2021   No results found for: "LDLDIRECT"                  HYPOTHYROIDISM: He was diagnosed to have primary hypothyroidism in 1994  He is consistent with his generic levothyroxine every morning before eating  Has been on levothyroxine 137 mcg daily TSH consistently normal  Lab Results  Component Value Date   TSH 1.480 01/03/2023   TSH 1.350 09/17/2022   TSH 1.750 04/30/2022   FREET4 1.68 01/03/2023   FREET4 1.77 07/25/2020   FREET4 1.49 03/25/2020       The blood pressure has been treated by cardiologist with 5 mg lisinopril    BP Readings from Last 3 Encounters:  01/14/23 108/70  09/27/22 132/68  09/24/22 120/70    Lab Results  Component Value Date   K 4.6 01/03/2023    Vitamin D deficiency:  Has been  taking 4000 units vitamin D3 regularly Levels as follows  Lab Results  Component Value Date   VD25OH 35.8 01/03/2023   VD25OH 37.3 04/30/2022   VD25OH 44.7 08/07/2021   VD25OH 44.1 11/28/2020    HYPERCALCEMIA:  As seen below the calcium is slightly high or upper normal, also note that his albumin level is slightly high PTH level has been normal, last 26 No history of kidney stones or known osteopenia  Lab Results  Component Value Date   CALCIUM 10.3 (H) 01/03/2023   CALCIUM 10.3 (H) 09/17/2022   CALCIUM 10.3 (H) 04/30/2022   CALCIUM 10.9 (H) 12/18/2021   Lab Results  Component Value Date   PTH 26 07/11/2019   CALCIUM 10.3 (H) 01/03/2023   CAION 1.15 03/13/2016    He has been prescribed allopurinol by PCP, uric acid normal at 5.7  Asking about muscle cramps at night: This is worse in the last few weeks and very painful and only relieved by getting out of bed and stretching.  Mostly in his legs but occasionally back.  Has not discussed with PCP  Fasting testosterone level checked because of his complaints of fatigue but it is normal   Physical Examination:  BP 108/70    Pulse 70   Ht 6\' 1"  (1.854 m)   Wt 226 lb 6.4 oz (102.7 kg)   SpO2 96%   BMI 29.87 kg/m        ASSESSMENT    Diabetes type 2, with recent BMI 29  He is on a 3 drug regimen of metformin, Trulicity 4.5 mg and Jardiance 25 mg   See history of present illness for discussion of his current management, blood sugar patterns and problems identified.   His A1c is about the same 6.1 % His Dexcom appears to be reading falsely high Both fasting and some postprandial readings are higher than expected However he goes off his diet with eating biscuits sometimes in  the morning and this will raise his blood sugars sometimes over 250 Although his weight has been maintained he has not done much formal exercise Will continue his medications unchanged He can try and be more diligent with improving his diet Restart walking program when able to He will calibrate his Dexcom and if not able to get accurate readings consider freestyle libre  HYPERPARATHYROIDISM: This is mild, asymptomatic and calcium just above normal range again  HYPOTHYROIDISM: He feels well TSH consistently normal with taking 137 mcg levothyroxine   Lipids: He has mixed hyperlipidemia with LDL borderline at 99, however adequate considering his risk factors and already being on medium intensity statin  Mild hypertension: Controlled consistently with 5 mg lisinopril  Needs urine microalbumin checked   PLAN:   No change in Trulicity, metformin or Jardiance Likely continue Verio with certain foods like biscuits at breakfast to prevent postprandial hyperglycemia Also may be able to start back on formal exercise again with walking briskly  Validate the accuracy of his Dexcom sensor and see if calibration will help improve the falsely high readings otherwise consider freestyle libre Recheck potassium as discussed above  Continue same dose of Synthroid Atorvastatin will be continued at 20 mg No change in allopurinol  recommended  Follow-up in 4 months again    There are no Patient Instructions on file for this visit.     Reather Littler 01/14/2023, 11:09 AM   Note: This office note was prepared with Dragon voice recognition system technology. Any transcriptional errors that result from this process are unintentional.

## 2023-01-16 NOTE — Addendum Note (Signed)
Addended by: Reather Littler on: 01/16/2023 09:22 PM   Modules accepted: Orders

## 2023-01-18 ENCOUNTER — Encounter: Payer: Self-pay | Admitting: Endocrinology

## 2023-03-10 ENCOUNTER — Other Ambulatory Visit: Payer: Self-pay

## 2023-03-10 DIAGNOSIS — E782 Mixed hyperlipidemia: Secondary | ICD-10-CM

## 2023-03-10 DIAGNOSIS — E1169 Type 2 diabetes mellitus with other specified complication: Secondary | ICD-10-CM

## 2023-03-10 DIAGNOSIS — E669 Obesity, unspecified: Secondary | ICD-10-CM

## 2023-03-10 MED ORDER — ATORVASTATIN CALCIUM 20 MG PO TABS
ORAL_TABLET | ORAL | 2 refills | Status: DC
Start: 2023-03-10 — End: 2023-09-13

## 2023-03-14 ENCOUNTER — Other Ambulatory Visit: Payer: Self-pay | Admitting: Internal Medicine

## 2023-04-17 ENCOUNTER — Encounter: Payer: Self-pay | Admitting: Endocrinology

## 2023-04-18 ENCOUNTER — Other Ambulatory Visit: Payer: Self-pay

## 2023-04-18 DIAGNOSIS — E1165 Type 2 diabetes mellitus with hyperglycemia: Secondary | ICD-10-CM

## 2023-04-18 MED ORDER — OZEMPIC (1 MG/DOSE) 4 MG/3ML ~~LOC~~ SOPN: PEN_INJECTOR | SUBCUTANEOUS | 0 refills | Status: DC

## 2023-05-04 ENCOUNTER — Encounter: Payer: Self-pay | Admitting: Endocrinology

## 2023-05-04 DIAGNOSIS — E1165 Type 2 diabetes mellitus with hyperglycemia: Secondary | ICD-10-CM

## 2023-05-05 NOTE — Telephone Encounter (Signed)
For the coming of visit , he will be due for hemoglobin A1c only which we can do in the clinic on the same day of visit.  He does not need prior lab work before the visit with me.  If he wants to do hemoglobin A1c with [facility], I have placed order please provide the prescription to him.

## 2023-05-10 ENCOUNTER — Other Ambulatory Visit: Payer: Self-pay

## 2023-05-10 DIAGNOSIS — E669 Obesity, unspecified: Secondary | ICD-10-CM

## 2023-05-10 MED ORDER — DEXCOM G7 SENSOR MISC: 3 refills | Status: AC

## 2023-05-13 ENCOUNTER — Other Ambulatory Visit: Payer: 59

## 2023-05-18 ENCOUNTER — Encounter: Payer: Self-pay | Admitting: Endocrinology

## 2023-05-18 ENCOUNTER — Ambulatory Visit: Payer: 59 | Admitting: Endocrinology

## 2023-05-18 VITALS — BP 126/70 | HR 82 | Resp 20 | Ht 73.0 in | Wt 229.0 lb

## 2023-05-18 DIAGNOSIS — E063 Autoimmune thyroiditis: Secondary | ICD-10-CM

## 2023-05-18 DIAGNOSIS — Z7984 Long term (current) use of oral hypoglycemic drugs: Secondary | ICD-10-CM

## 2023-05-18 DIAGNOSIS — E559 Vitamin D deficiency, unspecified: Secondary | ICD-10-CM

## 2023-05-18 DIAGNOSIS — E119 Type 2 diabetes mellitus without complications: Secondary | ICD-10-CM

## 2023-05-18 DIAGNOSIS — Z7985 Long-term (current) use of injectable non-insulin antidiabetic drugs: Secondary | ICD-10-CM

## 2023-05-18 DIAGNOSIS — E1169 Type 2 diabetes mellitus with other specified complication: Secondary | ICD-10-CM | POA: Diagnosis not present

## 2023-05-18 DIAGNOSIS — E669 Obesity, unspecified: Secondary | ICD-10-CM | POA: Diagnosis not present

## 2023-05-18 LAB — POCT GLYCOSYLATED HEMOGLOBIN (HGB A1C): Hemoglobin A1C: 6.2 % — AB (ref 4.0–5.6)

## 2023-05-18 MED ORDER — LEVOTHYROXINE SODIUM 137 MCG PO TABS: 137.0000 ug | ORAL_TABLET | Freq: Every day | ORAL | 3 refills | Status: DC

## 2023-05-18 MED ORDER — EMPAGLIFLOZIN 25 MG PO TABS: 25.0000 mg | ORAL_TABLET | Freq: Every day | ORAL | 3 refills | Status: DC

## 2023-05-18 MED ORDER — METFORMIN HCL 1000 MG PO TABS: ORAL_TABLET | ORAL | 3 refills | Status: DC

## 2023-05-18 MED ORDER — SEMAGLUTIDE (2 MG/DOSE) 8 MG/3ML ~~LOC~~ SOPN: 2.0000 mg | PEN_INJECTOR | SUBCUTANEOUS | 4 refills | Status: DC

## 2023-05-18 NOTE — Patient Instructions (Signed)
Latest Reference Range & Units 04/30/22 07:52 09/17/22 08:46 01/03/23 08:34 05/18/23 13:54  Hemoglobin A1C 4.0 - 5.6 % 6.0 (H) 6.1 (H) 6.2 (H) 6.2 !  (H): Data is abnormally high !: Data is abnormal

## 2023-05-18 NOTE — Progress Notes (Signed)
Outpatient Endocrinology Note [place] Sweta Halseth, MD   Patient's Name: Colin Wood    DOB: 11/14/1960    MRN: [id number]                                                    REASON OF VISIT: Follow up for type 2 diabetes mellitus  PCP: Wanda Plump, MD  HISTORY OF PRESENT ILLNESS:   Colin Wood is a 62 y.o. old male with past medical history listed below, is here for follow up for type 2 diabetes mellitus.   Pertinent Diabetes History: Patient was previously seen by Delila Spence and was last time seen in August 2024.  Patient was diagnosed with type 2 diabetes mellitus around 2011.  Chronic Diabetes Complications : Retinopathy: no. Last ophthalmology exam was done on annually, following with ophthalmology regularly.  Nephropathy: no, on ACE/ARB / lisinopril Peripheral neuropathy: no Coronary artery disease: no Stroke: no  Relevant comorbidities and cardiovascular risk factors: Obesity: yes Body mass index is 30.21 kg/m.  Hypertension: Yes  Hyperlipidemia : Yes, on statin   Current / Home Diabetic regimen includes:  Oral hypoglycemic drugs the patient is taking are: metformin 1 g twice a day, Ozempic 1 mg weekly, Jardiance 25 mg daily       Prior diabetic medications: Amaryl, Tradjenta in the past.  Trulicity was changed to Ozempic because of lack of availability of Trulicity in the past.  Glycemic data:    CONTINUOUS GLUCOSE MONITORING SYSTEM (CGMS) INTERPRETATION: At today's visit, we reviewed CGM downloads. The full report is scanned in the media. Reviewing the CGM trends, blood glucose are as follows:  Dexcom G7 CGM-  Sensor Download (Sensor download was reviewed and summarized below.) Dates: December 5 to May 18, 2023, 14 days  Glucose Management Indicator: 7.4%  Sensor usage : 100 %    Interpretation: -He has frequent hyperglycemia with blood sugar up to 250 range postprandially related with meals especially with lunch, some time with breakfast and  rarely with dinner.  Blood sugar overnight early morning and in between the meals are acceptable.  No hypoglycemia.   Hypoglycemia: Patient has no hypoglycemic episodes. Patient has hypoglycemia awareness.  Factors modifying glucose control: 1.  Diabetic diet assessment: 3 meals a day.  2.  Staying active or exercising:   3.  Medication compliance: compliant all of the time.  # Primary hypothyroidism -Diagnosed in 1994.  He has been taking levothyroxine 137 mcg daily.  # Vitamin D deficiency currently taking vitamin D3 4000 international unit daily.  # HYPERCALCEMIA:  For quite some time calcium is slightly high or upper normal, also note that his albumin level is slightly high PTH level has been normal, last 26. No history of kidney stones or known osteopenia  Interval history  Diabetes regimen as noted above.  CGM data as reviewed above.  He has complaints of occasional about once a week muscle ache and pain in the bilateral legs.  No numbness and tingling of the feet.  No vision problem.  No other complaints today.  Hemoglobin A1c today 6.2%.  He has been taking levothyroxine 137 mcg daily.  He had normal thyroid function test in August.  REVIEW OF SYSTEMS As per history of present illness.   PAST MEDICAL HISTORY: Past Medical History:  Diagnosis Date   Anxiety  on meds   Atrial flutter (HCC)    typical appearing; s/p ablation 12-2012   Constipation    Depression    hx of   Diabetes mellitus    TYPE 2-on meds   Fatty liver    GERD (gastroesophageal reflux disease)    Gout    Hyperlipidemia    Hypothyroidism    on meds   Leg edema    Obesity    Persistent atrial fibrillation (HCC)    PVI 12/2012   Prediabetes    Rhinitis    vasomotor   S/P Minimally invasive maze operation for atrial fibrillation 03/12/2016   Complete bilateral atrial lesion set using cryothermy and bipolar radiofrequency ablation with clipping of LA appendage via right mini thoracotomy  approach   S/P patent foramen ovale closure 03/12/2016   Sleep apnea    mild, now treated with CPAP since ~9-12    PAST SURGICAL HISTORY: Past Surgical History:  Procedure Laterality Date   ABLATION OF DYSRHYTHMIC FOCUS  01/05/2013   PVI and flutter ablation by Dr Johney Frame   ATRIAL FIBRILLATION ABLATION N/A 01/05/2013   Procedure: ATRIAL FIBRILLATION ABLATION;  Surgeon: Hillis Range, MD;  [facility] LAB;  Service: Cardiovascular;  Laterality: N/A;   CARDIAC CATHETERIZATION N/A 01/12/2016   Procedure: Right/Left Heart Cath and Coronary Angiography;  Surgeon: Yvonne Kendall, MD;  Location: [facility] INVASIVE CV LAB;  Service: Cardiovascular;  Laterality: N/A;   CARDIOVERSION  03/01/2011   CLIPPING OF ATRIAL APPENDAGE Left 03/12/2016   Procedure: CLIPPING OF ATRIAL APPENDAGE;  Surgeon: Purcell Nails, MD;  Location: [facility] OR;  Service: Open Heart Surgery;  Laterality: Left;   COLONOSCOPY  2020   HD-suprep(good)   MINIMALLY INVASIVE MAZE PROCEDURE N/A 03/12/2016   Procedure: MINIMALLY INVASIVE MAZE PROCEDURE;  Surgeon: Purcell Nails, MD;  Location: [facility] OR;  Service: Open Heart Surgery;  Laterality: N/A;   NASAL SINUS SURGERY     TEE WITHOUT CARDIOVERSION N/A 01/04/2013   Procedure: TRANSESOPHAGEAL ECHOCARDIOGRAM (TEE);  Surgeon: Pricilla Riffle, MD;  Location[facility] ENDOSCOPY;  Service: Cardiovascular;  Laterality: N/A;   TEE WITHOUT CARDIOVERSION N/A 03/12/2016   Procedure: TRANSESOPHAGEAL ECHOCARDIOGRAM (TEE);  Surgeon: Purcell Nails, MD;  Location: [facility] OR;  Service: Open Heart Surgery;  Laterality: N/A;   TONSILLECTOMY     VASECTOMY     WISDOM TOOTH EXTRACTION      ALLERGIES: No Known Allergies  FAMILY HISTORY:  Family History  Problem Relation Age of Onset   Asthma Mother    Breast cancer Mother    Dementia Mother    Colon polyps Father 70   Diabetes Father    Hyperlipidemia Father    Obesity Father    Atrial fibrillation Father    Hypertension Brother    Melanoma Brother     Throat cancer Maternal Uncle    Colon polyps Paternal Grandfather 64   Colon cancer Paternal Grandfather 54   Allergies Daughter    Prostate cancer Neg Hx    Esophageal cancer Neg Hx    Stomach cancer Neg Hx    Rectal cancer Neg Hx     SOCIAL HISTORY: Social History   Socioeconomic History   Marital status: Married    Spouse name: Elease Hashimoto   Number of children: 2   Years of education: Not on file   Highest education level: Not on file  Occupational History   Occupation: [employer]  Tobacco Use   Smoking status: Never   Smokeless tobacco: Never  Vaping Use   Vaping status: Never Used  Substance and Sexual Activity   Alcohol use: No   Drug use: No   Sexual activity: Not on file  Other Topics Concern   Not on file  Social History Narrative   Pt lives in [place] with spouse and 1 child   2 children  1988, 2000 .     Works in Consulting civil engineer at [employer].   Social Drivers of Corporate investment banker Strain: Not on [employer] Insecurity: Not on file  Transportation Needs: Not on file  Physical Activity: Not on file  Stress: Not on file  Social Connections: Not on file    MEDICATIONS:  Current Outpatient Medications  Medication Sig Dispense Refill   allopurinol (ZYLOPRIM) 100 MG tablet Take 2 tablets (200 mg total) by mouth daily. 180 tablet 1   Ascorbic Acid (VITAMIN C PO) Take 2 tablets by mouth 2 (two) times daily.     atorvastatin (LIPITOR) 20 MG tablet TAKE 1 TABLET BY MOUTH DAILY 90 tablet 2   azelastine (ASTELIN) 0.1 % nasal spray Place 2 sprays into both nostrils at bedtime as needed for rhinitis or allergies. 30 mL 3   Cholecalciferol 50 MCG (2000 UT) CAPS Take 2 capsules by mouth daily at 6 (six) AM.     Continuous Glucose Sensor (DEXCOM G7 SENSOR) MISC Change every 10 days 9 each 3   empagliflozin (JARDIANCE) 25 MG TABS tablet Take 1 tablet (25 mg total) by mouth daily. 90 tablet 1   famotidine (PEPCID) 20 MG tablet Take 1 tablet (20 mg total) by mouth at  bedtime. 90 tablet 2   fenofibrate 160 MG tablet TAKE 1 TABLET(160 MG) BY MOUTH DAILY 90 tablet 2   fluticasone (FLONASE) 50 MCG/ACT nasal spray Place 2 sprays into both nostrils daily. (Patient taking differently: Place 2 sprays into both nostrils daily as needed.) 16 mL 0   glucose blood (CONTOUR NEXT TEST) test strip CHECK BLOOD SUGAR  DAILY AS DIRECTED 50 strip 3   icosapent Ethyl (VASCEPA) 1 g capsule TAKE 2 CAPSULES(2 GRAMS) BY MOUTH TWICE DAILY 360 capsule 3   levothyroxine (SYNTHROID) 137 MCG tablet Take 1 tablet (137 mcg total) by mouth daily before breakfast. 90 tablet 3   lisinopril (ZESTRIL) 5 MG tablet Take 1 tablet (5 mg total) by mouth daily. 90 tablet 2   Magnesium 500 MG TABS Take 1 tablet by mouth daily in the afternoon.     metFORMIN (GLUCOPHAGE) 1000 MG tablet TAKE 1 TABLET(1000 MG) BY MOUTH TWICE DAILY WITH A MEAL 180 tablet 1   Microlet Lancets MISC USE TO CHECK BLOOD SUGAR THREE TIMES DAILY 100 each 3   pantoprazole (PROTONIX) 40 MG tablet TAKE 1 TABLET(40 MG) BY MOUTH DAILY BEFORE BREAKFAST 90 tablet 1   Semaglutide, 2 MG/DOSE, 8 MG/3ML SOPN Inject 2 mg as directed once a week. 3 mL 4   Sennosides-Docusate Sodium (SENNA-DOCUSATE SODIUM PO) Take 1 tablet by mouth as needed.     Zinc 50 MG CAPS Take 1 capsule by mouth daily at 6 (six) AM.     No current facility-administered medications for this visit.    PHYSICAL EXAM: Vitals:   05/18/23 1328  BP: 126/70  Pulse: 82  Resp: 20  SpO2: 98%  Weight: 229 lb (103.9 kg)  Height: 6\' 1"  (1.854 m)   Body mass index is 30.21 kg/m.  Wt Readings from Last 3 Encounters:  05/18/23 229 lb (103.9 kg)  01/14/23 226  lb 6.4 oz (102.7 kg)  09/27/22 227 lb 2 oz (103 kg)    General: Well developed, well nourished male in no apparent distress.  HEENT: AT/[place], no external lesions.  Eyes: Conjunctiva clear and no icterus. Neck: Neck supple  Lungs: Respirations not labored Neurologic: Alert, oriented, normal speech Extremities /  Skin: Dry. No sores or rashes noted.  Psychiatric: Does not appear depressed or anxious  Diabetic Foot Exam - Simple   No data filed     LABS Reviewed Lab Results  Component Value Date   HGBA1C 6.2 (A) 05/18/2023   HGBA1C 6.2 (H) 01/03/2023   HGBA1C 6.1 (H) 09/17/2022   No results found for: "FRUCTOSAMINE" Lab Results  Component Value Date   CHOL 185 01/03/2023   HDL 46 01/03/2023   LDLCALC 103 (H) 01/03/2023   TRIG 207 (H) 01/03/2023   CHOLHDL 3.6 09/17/2022   Lab Results  Component Value Date   MICRALBCREAT <4 09/24/2022   MICRALBCREAT <4 08/07/2021   Lab Results  Component Value Date   CREATININE 1.03 01/03/2023   No results found for: "GFR"  ASSESSMENT / PLAN  1. Controlled type 2 diabetes mellitus without complication, without long-term current use of insulin (HCC)   2. Acquired autoimmune hypothyroidism   3. Vitamin D deficiency     Diabetes Mellitus type 2, complicated by no known complications. - Diabetic status / severity: Controlled.  Lab Results  Component Value Date   HGBA1C 6.2 (A) 05/18/2023    - Hemoglobin A1c goal : <7%  - Medications: See below.  I) continue Jardiance 25 mg daily. II) increase Ozempic from 1 mg to 2 mg weekly. III) continue metformin 1000 mg 2 times a day.  - Home glucose testing: CGM and check as needed, Dexcom G7. - Discussed/ Gave Hypoglycemia treatment plan.  # Consult : not required at this time.   # Annual urine for microalbuminuria/ creatinine ratio, no microalbuminuria currently. Last  Lab Results  Component Value Date   MICRALBCREAT <4 09/24/2022    # Foot check nightly.  # Annual dilated diabetic eye exams.   - Diet: Make healthy diabetic food choices - Life style / activity / exercise: Discussed.  2. Blood pressure  -  BP Readings from Last 1 Encounters:  05/18/23 126/70    - Control is in target.  - No change in current plans.  3. Lipid status / Hyperlipidemia - Last  Lab Results   Component Value Date   LDLCALC 103 (H) 01/03/2023   - Continue atorvastatin 20 mg daily.  Continue fenofibrate and Vascepa.  Will check lipid panel in next follow-up visit. Patient has-complains of occasional bilateral leg pain.  Atorvastatin dose was increased few months ago.  Will continue to monitor at this time.  Patient will try to avoid soft drinks including Dr. Reino Kent drink.  # Hypothyroidism : Continue levothyroxine 137 mcg daily.  # Vitamin D deficiency, continue vitamin D3 4000 international units daily.  Okay to take increased dose of vitamin D3 during winter months.  Diagnoses and all orders for this visit:  Controlled type 2 diabetes mellitus without complication, without long-term current use of insulin (HCC) -     POCT glycosylated hemoglobin (Hb A1C) -     Hemoglobin A1c -     BASIC METABOLIC PANEL WITH GFR -     Lipid panel -     Microalbumin / creatinine urine ratio  Acquired autoimmune hypothyroidism -     T4, free -  TSH  Vitamin D deficiency -     VITAMIN D 25 Hydroxy (Vit-D Deficiency, Fractures)  Other orders -     Semaglutide, 2 MG/DOSE, 8 MG/3ML SOPN; Inject 2 mg as directed once a week.   Patient will complete above-mentioned labs prior to follow-up visit at [facility] in 4 months.  DISPOSITION Follow up in clinic in 4  months suggested.   All questions answered and patient verbalized understanding of the plan.  [place] Alcus Bradly, MD [facility] [address] [place], [address] [place], [place] [address] Phone # [phone]  At least part of this note was generated using voice recognition software. Inadvertent word errors may have occurred, which were not recognized during the proofreading process.

## 2023-05-20 ENCOUNTER — Ambulatory Visit: Payer: 59 | Admitting: Endocrinology

## 2023-05-20 ENCOUNTER — Encounter: Payer: Self-pay | Admitting: Endocrinology

## 2023-05-21 ENCOUNTER — Other Ambulatory Visit: Payer: Self-pay | Admitting: Internal Medicine

## 2023-05-30 ENCOUNTER — Ambulatory Visit: Payer: 59 | Attending: Cardiovascular Disease | Admitting: Cardiovascular Disease

## 2023-05-30 ENCOUNTER — Encounter: Payer: Self-pay | Admitting: Cardiovascular Disease

## 2023-05-30 VITALS — BP 130/60 | HR 77 | Ht 73.0 in | Wt 227.2 lb

## 2023-05-30 DIAGNOSIS — I4819 Other persistent atrial fibrillation: Secondary | ICD-10-CM

## 2023-05-30 NOTE — Progress Notes (Signed)
  Electrophysiology Office Note:    Date:  05/30/2023   ID:  [name], [name] 1960-12-24, MRN [id number]  PCP:  Wanda Plump, MD   [facility] Providers Cardiologist:  None     Referring MD: Wanda Plump, MD   History of Present Illness:    Colin Wood is a 62 y.o. male with a medical history significant for hypothyroidism, diabetes, persistent atrial fibrillation and typical-appearing flutter status post ablation in 2014 and maze procedure in 2019 who presents for follow-up.     He reports that as far as he is aware, he has not had A-fib since recovering from his maze procedure.  He has taken Tikosyn in the past but this was discontinued at some point but he does not recall why.  He does not remember having any issues or adverse effects from the Tikosyn.     Today, he is doing well and continues to be free of atrial fibrillation.  His only complaint is some mild swelling on the top of 1 foot.  EKGs/Labs/Other Studies Reviewed Today:     Echocardiogram:  July 2021 TTE EF 55 to 60%.  Severe concentric LVH.     Advanced imaging:  CMR 2021 Normal EF.  No LGE.    EKG:   EKG Interpretation Date/Time:  Monday May 30 2023 08:00:21 EST Ventricular Rate:  77 PR Interval:  174 QRS Duration:  128 QT Interval:  404 QTC Calculation: 457 R Axis:   -37  Text Interpretation: Normal sinus rhythm with sinus arrhythmia Left axis deviation Right bundle branch block Minimal voltage criteria for LVH, may be normal variant ( R in aVL ) When compared with ECG of 14-May-2022 08:45, PACs no longer present Confirmed by York Pellant [phone]) on 05/30/2023 8:07:30 AM     Physical Exam:    VS:  BP 130/60 (BP Location: Left Arm, Patient Position: Sitting, Cuff Size: Large)   Pulse 77   Ht 6\' 1"  (1.854 m)   Wt 227 lb 3.2 oz (103.1 kg)   SpO2 96%   BMI 29.98 kg/m     Wt Readings from Last 3 Encounters:  05/30/23 227 lb 3.2 oz (103.1 kg)  05/18/23 229 lb  (103.9 kg)  01/14/23 226 lb 6.4 oz (102.7 kg)     GEN: Well nourished, well developed in no acute distress CARDIAC: RRR, no murmurs, rubs, gallops RESPIRATORY:  Normal work of breathing MUSCULOSKELETAL: no significant edema    ASSESSMENT & PLAN:     Persistent atrial fibrillation Status post ablation 2014, Maze procedure 2019 Status post left atrial appendage clip  Typical-appearing atrial flutter Status post ablation 2014   This is my first time meeting Colin Wood.  I reviewed his most recent echocardiogram, the MRI, lab results from August 2024 as well as notes from [facility] and Dr. Erroll Luna.   Signed, Maurice Small, MD  05/30/2023 8:07 AM    [facility]

## 2023-05-30 NOTE — Patient Instructions (Signed)

## 2023-07-03 ENCOUNTER — Encounter: Payer: Self-pay | Admitting: Endocrinology

## 2023-07-04 ENCOUNTER — Other Ambulatory Visit: Payer: Self-pay

## 2023-07-04 DIAGNOSIS — E119 Type 2 diabetes mellitus without complications: Secondary | ICD-10-CM

## 2023-07-04 MED ORDER — SEMAGLUTIDE (2 MG/DOSE) 8 MG/3ML ~~LOC~~ SOPN: 2.0000 mg | PEN_INJECTOR | SUBCUTANEOUS | 0 refills | Status: DC

## 2023-08-02 ENCOUNTER — Other Ambulatory Visit: Payer: Self-pay

## 2023-08-02 DIAGNOSIS — E785 Hyperlipidemia, unspecified: Secondary | ICD-10-CM

## 2023-08-02 MED ORDER — FENOFIBRATE 160 MG PO TABS: ORAL_TABLET | ORAL | 2 refills | Status: DC

## 2023-09-12 ENCOUNTER — Encounter: Payer: Self-pay | Admitting: Endocrinology

## 2023-09-13 ENCOUNTER — Other Ambulatory Visit: Payer: Self-pay

## 2023-09-13 ENCOUNTER — Other Ambulatory Visit: Payer: Self-pay | Admitting: Endocrinology

## 2023-09-13 DIAGNOSIS — E782 Mixed hyperlipidemia: Secondary | ICD-10-CM

## 2023-09-13 MED ORDER — ATORVASTATIN CALCIUM 20 MG PO TABS: ORAL_TABLET | ORAL | 2 refills | Status: AC

## 2023-09-14 ENCOUNTER — Encounter: Payer: Self-pay | Admitting: Endocrinology

## 2023-09-14 ENCOUNTER — Telehealth: Payer: Self-pay | Admitting: Pharmacy Technician

## 2023-09-14 ENCOUNTER — Other Ambulatory Visit (HOSPITAL_COMMUNITY): Payer: Self-pay

## 2023-09-14 ENCOUNTER — Other Ambulatory Visit: Payer: Self-pay

## 2023-09-14 DIAGNOSIS — E782 Mixed hyperlipidemia: Secondary | ICD-10-CM

## 2023-09-14 LAB — MICROALBUMIN / CREATININE URINE RATIO
Creatinine, Urine: 83.4 mg/dL
Microalb/Creat Ratio: 4 mg/g{creat} (ref 0–29)
Microalbumin, Urine: 3.5 ug/mL

## 2023-09-14 LAB — LIPID PANEL W/O CHOL/HDL RATIO
Cholesterol, Total: 169 mg/dL (ref 100–199)
HDL: 46 mg/dL (ref >39)
LDL Chol Calc (NIH): 96 mg/dL (ref 0–99)
Triglycerides: 156 mg/dL — ABNORMAL HIGH (ref 0–149)
VLDL Cholesterol Cal: 27 mg/dL (ref 5–40)

## 2023-09-14 LAB — BASIC METABOLIC PANEL WITH GFR
BUN/Creatinine Ratio: 17 (ref 10–24)
BUN: 18 mg/dL (ref 8–27)
CO2: 22 mmol/L (ref 20–29)
Calcium: 10 mg/dL (ref 8.6–10.2)
Chloride: 100 mmol/L (ref 96–106)
Creatinine, Ser: 1.04 mg/dL (ref 0.76–1.27)
Glucose: 126 mg/dL — ABNORMAL HIGH (ref 70–99)
Potassium: 5 mmol/L (ref 3.5–5.2)
Sodium: 140 mmol/L (ref 134–144)
eGFR: 81 mL/min/{1.73_m2} (ref >59)

## 2023-09-14 LAB — HGB A1C W/O EAG: Hgb A1c MFr Bld: 6.2 % — ABNORMAL HIGH (ref 4.8–5.6)

## 2023-09-14 LAB — T4, FREE: Free T4: 1.64 ng/dL (ref 0.82–1.77)

## 2023-09-14 LAB — TSH: TSH: 1.97 u[IU]/mL (ref 0.450–4.500)

## 2023-09-14 LAB — VITAMIN D 25 HYDROXY (VIT D DEFICIENCY, FRACTURES): Vit D, 25-Hydroxy: 41.5 ng/mL (ref 30.0–100.0)

## 2023-09-14 MED ORDER — ICOSAPENT ETHYL 1 G PO CAPS: ORAL_CAPSULE | ORAL | 3 refills | Status: AC

## 2023-09-14 NOTE — Telephone Encounter (Signed)
 Pharmacy Patient Advocate Encounter   Received notification from CoverMyMeds that prior authorization for Icosapent Ethyl 1GM capsules is required/requested.   Insurance verification completed.   The patient is insured through [facility] .   Per test claim: PA required and submitted KEY/EOC/Request #: [id number] APPROVED from 09/14/2023 to 09/13/2024. Ran test claim, Copay is $10.00. This test claim was processed through [facility]- copay amounts may vary at other pharmacies due to pharmacy/plan contracts, or as the patient moves through the different stages of their insurance plan.

## 2023-09-15 ENCOUNTER — Telehealth: Payer: Self-pay

## 2023-09-15 NOTE — Telephone Encounter (Signed)
 Pharmacy Patient Advocate Encounter   Received notification from CoverMyMeds that prior authorization for Icosapent Ethyl 1GM capsules is required/requested.   Insurance verification completed.   The patient is insured through [facility] .   Per test claim: PA required and submitted KEY/EOC/Request #: [id number] APPROVED from 09/14/2023 to 09/13/2024. Ran test claim, Copay is $10.00. This test claim was processed through [facility]- copay amounts may vary at other pharmacies due to pharmacy/plan contracts, or as the patient moves through the different stages of their insurance plan.

## 2023-09-15 NOTE — Telephone Encounter (Signed)
 Office received a PA from [employer] for Vascepa 1mg .

## 2023-09-19 ENCOUNTER — Encounter: Payer: Self-pay | Admitting: Endocrinology

## 2023-09-19 ENCOUNTER — Ambulatory Visit: Payer: 59 | Admitting: Endocrinology

## 2023-09-19 VITALS — BP 120/62 | HR 82 | Resp 18 | Ht 73.0 in | Wt 226.6 lb

## 2023-09-19 DIAGNOSIS — E119 Type 2 diabetes mellitus without complications: Secondary | ICD-10-CM | POA: Diagnosis not present

## 2023-09-19 DIAGNOSIS — Z7984 Long term (current) use of oral hypoglycemic drugs: Secondary | ICD-10-CM

## 2023-09-19 DIAGNOSIS — E559 Vitamin D deficiency, unspecified: Secondary | ICD-10-CM

## 2023-09-19 DIAGNOSIS — E063 Autoimmune thyroiditis: Secondary | ICD-10-CM

## 2023-09-19 DIAGNOSIS — Z7985 Long-term (current) use of injectable non-insulin antidiabetic drugs: Secondary | ICD-10-CM

## 2023-09-19 NOTE — Progress Notes (Signed)
 Outpatient Endocrinology Note [place] Beatric Fulop, MD   Patient's Name: Colin Wood    DOB: 02/19/1961    MRN: [id number]                                                    REASON OF VISIT: Follow up for type 2 diabetes mellitus  PCP: Ezell Hollow, MD  HISTORY OF PRESENT ILLNESS:   Colin Wood is a 63 y.o. old male with past medical history listed below, is here for follow up for type 2 diabetes mellitus.   Pertinent Diabetes History: Patient was previously seen by Sharion Davidson and was last time seen in August 2024.  Patient was diagnosed with type 2 diabetes mellitus around 2011.  Chronic Diabetes Complications : Retinopathy: no. Last ophthalmology exam was done on annually, following with ophthalmology regularly.  Nephropathy: no, on ACE/ARB / lisinopril  Peripheral neuropathy: no Coronary artery disease: no Stroke: no  Relevant comorbidities and cardiovascular risk factors: Obesity: yes Body mass index is 29.9 kg/m.  Hypertension: Yes  Hyperlipidemia : Yes, on statin   Current / Home Diabetic regimen includes:  Oral hypoglycemic drugs the patient is taking are: metformin  1 g twice a day, Ozempic  2 mg weekly, Jardiance  25 mg daily       Prior diabetic medications: Amaryl , Tradjenta  in the past.  Trulicity  was changed to Ozempic  because of lack of availability of Trulicity  in the past.  Glycemic data:    CONTINUOUS GLUCOSE MONITORING SYSTEM (CGMS) INTERPRETATION: At today's visit, we reviewed CGM downloads. The full report is scanned in the media. Reviewing the CGM trends, blood glucose are as follows:  Dexcom G7 CGM-  Sensor Download (Sensor download was reviewed and summarized below.) Dates: April 8 to September 19, 2023, 14 days  Glucose Management Indicator: 7.1%  Sensor usage : 83 %    Interpretation: Mostly acceptable blood sugar with random occasional mild hyperglycemia with blood sugar up to 200 range postprandially usually with breakfast and sometime with  supper and at bedtime.  No hypoglycemia.  Blood sugar overnight and in between the meals are acceptable.  No concerning hypoglycemia.  Hypoglycemia: Patient has no hypoglycemic episodes. Patient has hypoglycemia awareness.  Factors modifying glucose control: 1.  Diabetic diet assessment: 3 meals a day.  2.  Staying active or exercising:   3.  Medication compliance: compliant all of the time.  # Primary hypothyroidism -Diagnosed in 1994.  He has been taking levothyroxine  137 mcg daily.  # Vitamin D  deficiency currently taking vitamin D3 4000 international unit daily.  # HYPERCALCEMIA:  For quite some time calcium  is slightly high or upper normal, also note that his albumin  level is slightly high PTH level has been normal, last 26. No history of kidney stones or known osteopenia  Interval history  Recent laboratory results reviewed.  Normal electrolytes, serum calcium , renal function.  Lustral level acceptable.  Thyroid  function test normal.  Vitamin D  level is normal.  Hemoglobin A1c 6.2%.  Urine microalbumin creatinine ratio normal.  Diabetes regimen as reviewed and noted above.  CGM data as reviewed above.  Mostly acceptable blood sugar.  He has no hypo and hyperthyroid symptoms.  He has been taking levothyroxine  137 mcg daily.  Recent thyroid  function test normal.    He has no numbness and ting of the feet.  No  other complaints today.  REVIEW OF SYSTEMS As per history of present illness.   PAST MEDICAL HISTORY: Past Medical History:  Diagnosis Date   Anxiety    on meds   Atrial flutter (HCC)    typical appearing; s/p ablation 12-2012   Constipation    Depression    hx of   Diabetes mellitus    TYPE 2-on meds   Fatty liver    GERD (gastroesophageal reflux disease)    Gout    Hyperlipidemia    Hypothyroidism    on meds   Leg edema    Obesity    Persistent atrial fibrillation (HCC)    PVI 12/2012   Prediabetes    Rhinitis    vasomotor   S/P Minimally invasive  maze operation for atrial fibrillation 03/12/2016   Complete bilateral atrial lesion set using cryothermy and bipolar radiofrequency ablation with clipping of LA appendage via right mini thoracotomy approach   S/P patent foramen ovale closure 03/12/2016   Sleep apnea    mild, now treated with CPAP since ~9-12    PAST SURGICAL HISTORY: Past Surgical History:  Procedure Laterality Date   ABLATION OF DYSRHYTHMIC FOCUS  01/05/2013   PVI and flutter ablation by Dr Nunzio Belch   ATRIAL FIBRILLATION ABLATION N/A 01/05/2013   Procedure: ATRIAL FIBRILLATION ABLATION;  Surgeon: Jolly Needle, MD;  Location: [facility] CATH LAB;  Service: Cardiovascular;  Laterality: N/A;   CARDIAC CATHETERIZATION N/A 01/12/2016   Procedure: Right/Left Heart Cath and Coronary Angiography;  Surgeon: Sammy Crisp, MD;  Location: [facility] INVASIVE CV LAB;  Service: Cardiovascular;  Laterality: N/A;   CARDIOVERSION  03/01/2011   CLIPPING OF ATRIAL APPENDAGE Left 03/12/2016   Procedure: CLIPPING OF ATRIAL APPENDAGE;  Surgeon: Gardenia Jump, MD;  Location: [facility] OR;  Service: Open Heart Surgery;  Laterality: Left;   COLONOSCOPY  2020   HD-suprep(good)   MINIMALLY INVASIVE MAZE PROCEDURE N/A 03/12/2016   Procedure: MINIMALLY INVASIVE MAZE PROCEDURE;  Surgeon: Gardenia Jump, MD;  Location: [facility] OR;  Service: Open Heart Surgery;  Laterality: N/A;   NASAL SINUS SURGERY     TEE WITHOUT CARDIOVERSION N/A 01/04/2013   Procedure: TRANSESOPHAGEAL ECHOCARDIOGRAM (TEE);  Surgeon: Elmyra Haggard, MD;  Location: [facility] ENDOSCOPY;  Service: Cardiovascular;  Laterality: N/A;   TEE WITHOUT CARDIOVERSION N/A 03/12/2016   Procedure: TRANSESOPHAGEAL ECHOCARDIOGRAM (TEE);  Surgeon: Gardenia Jump, MD;  Location: [facility] OR;  Service: Open Heart Surgery;  Laterality: N/A;   TONSILLECTOMY     VASECTOMY     WISDOM TOOTH EXTRACTION      ALLERGIES: No Known Allergies  FAMILY HISTORY:  Family History  Problem Relation Age of Onset   Asthma Mother    Breast cancer  Mother    Dementia Mother    Colon polyps Father 65   Diabetes Father    Hyperlipidemia Father    Obesity Father    Atrial fibrillation Father    Hypertension Brother    Melanoma Brother    Throat cancer Maternal Uncle    Colon polyps Paternal Grandfather 42   Colon cancer Paternal Grandfather 36   Allergies Daughter    Prostate cancer Neg Hx    Esophageal cancer Neg Hx    Stomach cancer Neg Hx    Rectal cancer Neg Hx     SOCIAL HISTORY: Social History   Socioeconomic History   Marital status: Married    Spouse name: Devra Fontana   Number of children: 2   Years of education: Not on file  Highest education level: Not on file  Occupational History   Occupation: [employer]- [employer]  Tobacco Use   Smoking status: Never   Smokeless tobacco: Never  Vaping Use   Vaping status: Never Used  Substance and Sexual Activity   Alcohol use: No   Drug use: No   Sexual activity: Not on file  Other Topics Concern   Not on file  Social History Narrative   Pt lives in [place] with spouse and 1 child   2 children  1988, 2000 .     Works in Consulting civil engineer at [employer].   Social Drivers of Corporate investment banker Strain: Not on [employer] Insecurity: Not on file  Transportation Needs: Not on file  Physical Activity: Not on file  Stress: Not on file  Social Connections: Not on file    MEDICATIONS:  Current Outpatient Medications  Medication Sig Dispense Refill   allopurinol  (ZYLOPRIM ) 100 MG tablet Take 2 tablets (200 mg total) by mouth daily. 180 tablet 1   Ascorbic Acid (VITAMIN C PO) Take 2 tablets by mouth 2 (two) times daily.     atorvastatin  (LIPITOR) 20 MG tablet TAKE 1 TABLET BY MOUTH DAILY 90 tablet 2   azelastine  (ASTELIN ) 0.1 % nasal spray Place 2 sprays into both nostrils at bedtime as needed for rhinitis or allergies. 30 mL 3   Cholecalciferol 50 MCG (2000 UT) CAPS Take 3 capsules by mouth daily at 6 (six) AM.     Continuous Glucose Sensor (DEXCOM G7 SENSOR) MISC Change every 10  days 9 each 3   empagliflozin  (JARDIANCE ) 25 MG TABS tablet Take 1 tablet (25 mg total) by mouth daily. 90 tablet 3   famotidine  (PEPCID ) 20 MG tablet Take 1 tablet (20 mg total) by mouth at bedtime. 90 tablet 2   fenofibrate  160 MG tablet TAKE 1 TABLET(160 MG) BY MOUTH DAILY 90 tablet 2   fluticasone  (FLONASE ) 50 MCG/ACT nasal spray Place 2 sprays into both nostrils daily. (Patient taking differently: Place 2 sprays into both nostrils daily as needed.) 16 mL 0   glucose blood (CONTOUR NEXT TEST) test strip CHECK BLOOD SUGAR  DAILY AS DIRECTED 50 strip 3   icosapent  Ethyl (VASCEPA ) 1 g capsule TAKE 2 CAPSULES(2 GRAMS) BY MOUTH TWICE DAILY 360 capsule 3   levothyroxine  (SYNTHROID ) 137 MCG tablet Take 1 tablet (137 mcg total) by mouth daily before breakfast. 90 tablet 3   lisinopril  (ZESTRIL ) 5 MG tablet Take 1 tablet (5 mg total) by mouth daily. 90 tablet 2   Magnesium  500 MG TABS Take 1 tablet by mouth daily in the afternoon.     metFORMIN  (GLUCOPHAGE ) 1000 MG tablet TAKE 1 TABLET(1000 MG) BY MOUTH TWICE DAILY WITH A MEAL 180 tablet 3   Microlet Lancets MISC USE TO CHECK BLOOD SUGAR THREE TIMES DAILY 100 each 3   pantoprazole  (PROTONIX ) 40 MG tablet TAKE 1 TABLET(40 MG) BY MOUTH DAILY BEFORE BREAKFAST 90 tablet 1   Semaglutide , 2 MG/DOSE, 8 MG/3ML SOPN Inject 2 mg as directed once a week. 9 mL 0   Sennosides-Docusate Sodium  (SENNA-DOCUSATE SODIUM  PO) Take 1 tablet by mouth as needed.     Zinc 50 MG CAPS Take 1 capsule by mouth daily at 6 (six) AM.     No current facility-administered medications for this visit.    PHYSICAL EXAM: Vitals:   09/19/23 0814  BP: 120/62  Pulse: 82  Resp: 18  SpO2: 98%  Weight: 226 lb 9.6 oz (102.8  kg)  Height: 6\' 1"  (1.854 m)   Body mass index is 29.9 kg/m.  Wt Readings from Last 3 Encounters:  09/19/23 226 lb 9.6 oz (102.8 kg)  05/30/23 227 lb 3.2 oz (103.1 kg)  05/18/23 229 lb (103.9 kg)    General: Well developed, well nourished male in no apparent  distress.  HEENT: AT/[place], no external lesions.  Eyes: Conjunctiva clear and no icterus. Neck: Neck supple  Lungs: Respirations not labored Neurologic: Alert, oriented, normal speech Extremities / Skin: Dry.  Psychiatric: Does not appear depressed or anxious  Diabetic Foot Exam - Simple   Simple Foot Form Diabetic Foot exam was performed with the following findings: Yes 09/19/2023  8:34 AM  Visual Inspection No deformities, no ulcerations, no other skin breakdown bilaterally: Yes Sensation Testing Intact to touch and monofilament testing bilaterally: Yes Pulse Check Comments     LABS Reviewed Lab Results  Component Value Date   HGBA1C 6.2 (H) 09/13/2023   HGBA1C 6.2 (A) 05/18/2023   HGBA1C 6.2 (H) 01/03/2023   No results found for: "FRUCTOSAMINE" Lab Results  Component Value Date   CHOL 169 09/13/2023   HDL 46 09/13/2023   LDLCALC 96 09/13/2023   TRIG 156 (H) 09/13/2023   CHOLHDL 3.6 09/17/2022   Lab Results  Component Value Date   MICRALBCREAT 4 09/13/2023   MICRALBCREAT <4 09/24/2022   Lab Results  Component Value Date   CREATININE 1.04 09/13/2023   No results found for: "GFR"  ASSESSMENT / PLAN  1. Controlled type 2 diabetes mellitus without complication, without long-term current use of insulin  (HCC)   2. Acquired autoimmune hypothyroidism   3. Vitamin D  deficiency      Diabetes Mellitus type 2, complicated by no known complications. - Diabetic status / severity: Controlled.  Lab Results  Component Value Date   HGBA1C 6.2 (H) 09/13/2023    - Hemoglobin A1c goal : <6.5%  - Medications: See below.  No change.  I) continue Jardiance  25 mg daily. II) continue Ozempic  2 mg weekly. III) continue metformin  1000 mg 2 times a day.  - Home glucose testing: CGM and check as needed, Dexcom G7. - Discussed/ Gave Hypoglycemia treatment plan.  # Consult : not required at this time.   # Annual urine for microalbuminuria/ creatinine ratio, no  microalbuminuria currently. Last  Lab Results  Component Value Date   MICRALBCREAT 4 09/13/2023    # Foot check nightly.  # Annual dilated diabetic eye exams.   - Diet: Make healthy diabetic food choices - Life style / activity / exercise: Discussed.  2. Blood pressure  -  BP Readings from Last 1 Encounters:  09/19/23 120/62    - Control is in target.  - No change in current plans.  3. Lipid status / Hyperlipidemia - Last  Lab Results  Component Value Date   LDLCALC 96 09/13/2023   - Continue atorvastatin  20 mg daily.  Continue fenofibrate  and Vascepa .    # Hypothyroidism : Continue levothyroxine  137 mcg daily.  Thyroid  function test in every 6 to 12 months.  # Vitamin D  deficiency, continue vitamin D3 4000 international units daily.  Okay to take increased dose of vitamin D3 during winter months.  Recent vitamin D  level normal.  Annual vitamin D  level checking.  Diagnoses and all orders for this visit:  Controlled type 2 diabetes mellitus without complication, without long-term current use of insulin  (HCC)  Acquired autoimmune hypothyroidism  Vitamin D  deficiency   He prefers to do  labs at [facility] when needed.  DISPOSITION Follow up in clinic in 4  months suggested.   All questions answered and patient verbalized understanding of the plan.  [place] Deyani Hegarty, MD [facility] Endocrinology [facility] [address] [place], [address] [place], [place] [address] Phone # [phone]  At least part of this note was generated using voice recognition software. Inadvertent word errors may have occurred, which were not recognized during the proofreading process.

## 2023-09-21 LAB — HM DIABETES EYE EXAM

## 2023-09-22 ENCOUNTER — Encounter: Payer: Self-pay | Admitting: Endocrinology

## 2023-09-26 ENCOUNTER — Other Ambulatory Visit: Payer: Self-pay | Admitting: Internal Medicine

## 2023-09-30 ENCOUNTER — Encounter: Payer: Self-pay | Admitting: Internal Medicine

## 2023-09-30 ENCOUNTER — Ambulatory Visit (INDEPENDENT_AMBULATORY_CARE_PROVIDER_SITE_OTHER): Payer: 59 | Admitting: Internal Medicine

## 2023-09-30 VITALS — BP 122/76 | HR 86 | Temp 98.3°F | Resp 16 | Ht 73.0 in | Wt 221.4 lb

## 2023-09-30 DIAGNOSIS — E119 Type 2 diabetes mellitus without complications: Secondary | ICD-10-CM

## 2023-09-30 DIAGNOSIS — I10 Essential (primary) hypertension: Secondary | ICD-10-CM | POA: Diagnosis not present

## 2023-09-30 DIAGNOSIS — E785 Hyperlipidemia, unspecified: Secondary | ICD-10-CM | POA: Diagnosis not present

## 2023-09-30 DIAGNOSIS — R972 Elevated prostate specific antigen [PSA]: Secondary | ICD-10-CM

## 2023-09-30 DIAGNOSIS — Z0000 Encounter for general adult medical examination without abnormal findings: Secondary | ICD-10-CM | POA: Diagnosis not present

## 2023-09-30 DIAGNOSIS — M109 Gout, unspecified: Secondary | ICD-10-CM

## 2023-09-30 DIAGNOSIS — E038 Other specified hypothyroidism: Secondary | ICD-10-CM

## 2023-09-30 MED ORDER — AZELASTINE HCL 0.1 % NA SOLN: 2.0000 | Freq: Every evening | NASAL | 3 refills | Status: AC | PRN

## 2023-09-30 MED ORDER — COLCHICINE 0.6 MG PO TABS: 0.6000 mg | ORAL_TABLET | Freq: Two times a day (BID) | ORAL | 1 refills | Status: AC | PRN

## 2023-09-30 NOTE — Patient Instructions (Signed)
 INSTRUCTIONS  FOR TODAY  Vaccines to consider Flu shot every fall COVID-vaccine if you have not received the 1 4 01-2023  Gout: We are checking your uric acid, we might be able to decrease the allopurinol  dose. If that is the case, they colchicine  preemptively for few days.   GO TO THE LAB : Get the blood work     Next office visit for a physical exam in 1 year Please make an appointment before you leave today       "Health Care Power of attorney" (Also know as a  "Living will" or  Advance care planning documents)  If you already have a living will or healthcare power of attorney, is recommended you bring the copy to be scanned in your chart.   The document will be available to all the doctors you see in the system.  If you are over 64 y/o and don't have the document, please read:  Advance care planning is a process that supports adults in  understanding and sharing their preferences regarding future medical care.  The patient's preferences are recorded in documents called Advance Directives and the can be modified at any time while the patient is in full mental capacity.     More information at: [email or web address]

## 2023-09-30 NOTE — Progress Notes (Unsigned)
 Subjective:    Patient ID: Colin Wood, male    DOB: [birth year], 63 y.o.   MRN: [id number]  DOS:  09/30/2023 Type of visit - description: CPX  Here for CPX. Feeling great.  Wt Readings from Last 3 Encounters:  09/30/23 221 lb 6 oz (100.4 kg)  09/19/23 226 lb 9.6 oz (102.8 kg)  05/30/23 227 lb 3.2 oz (103.1 kg)   Review of Systems  A 14 point review of systems is negative     Past Medical History:  Diagnosis Date   Anxiety    on meds   Atrial flutter (HCC)    typical appearing; s/p ablation 12-2012   Constipation    Depression    hx of   Diabetes mellitus    TYPE 2-on meds   Fatty liver    GERD (gastroesophageal reflux disease)    Gout    Hyperlipidemia    Hypothyroidism    on meds   Leg edema    Obesity    Persistent atrial fibrillation (HCC)    PVI 12/2012   Prediabetes    Rhinitis    vasomotor   S/P Minimally invasive maze operation for atrial fibrillation 03/12/2016   Complete bilateral atrial lesion set using cryothermy and bipolar radiofrequency ablation with clipping of LA appendage via right mini thoracotomy approach   S/P patent foramen ovale closure 03/12/2016   Sleep apnea    mild, now treated with CPAP since ~9-12    Past Surgical History:  Procedure Laterality Date   ABLATION OF DYSRHYTHMIC FOCUS  01/05/2013   PVI and flutter ablation by Dr Nunzio Belch   ATRIAL FIBRILLATION ABLATION N/A 01/05/2013   Procedure: ATRIAL FIBRILLATION ABLATION;  Surgeon: Jolly Needle, MD;  Location: [facility] CATH LAB;  Service: Cardiovascular;  Laterality: N/A;   CARDIAC CATHETERIZATION N/A 01/12/2016   Procedure: Right/Left Heart Cath and Coronary Angiography;  Surgeon: Sammy Crisp, MD;  Location: [facility] INVASIVE CV LAB;  Service: Cardiovascular;  Laterality: N/A;   CARDIOVERSION  03/01/2011   CLIPPING OF ATRIAL APPENDAGE Left 03/12/2016   Procedure: CLIPPING OF ATRIAL APPENDAGE;  Surgeon: Gardenia Jump, MD;  Location: [facility] OR;  Service: Open Heart Surgery;  Laterality:  Left;   COLONOSCOPY  2020   HD-suprep(good)   MINIMALLY INVASIVE MAZE PROCEDURE N/A 03/12/2016   Procedure: MINIMALLY INVASIVE MAZE PROCEDURE;  Surgeon: Gardenia Jump, MD;  Location: [facility] OR;  Service: Open Heart Surgery;  Laterality: N/A;   NASAL SINUS SURGERY     TEE WITHOUT CARDIOVERSION N/A 01/04/2013   Procedure: TRANSESOPHAGEAL ECHOCARDIOGRAM (TEE);  Surgeon: Elmyra Haggard, MD;  Location: [facility] ENDOSCOPY;  Service: Cardiovascular;  Laterality: N/A;   TEE WITHOUT CARDIOVERSION N/A 03/12/2016   Procedure: TRANSESOPHAGEAL ECHOCARDIOGRAM (TEE);  Surgeon: Gardenia Jump, MD;  Location: [facility] OR;  Service: Open Heart Surgery;  Laterality: N/A;   TONSILLECTOMY     VASECTOMY     WISDOM TOOTH EXTRACTION     Social History   Socioeconomic History   Marital status: Married    Spouse name: Devra Fontana   Number of children: 2   Years of education: Not on file   Highest education level: Not on file  Occupational History   Occupation: [employer]- [employer]  Tobacco Use   Smoking status: Never   Smokeless tobacco: Never  Vaping Use   Vaping status: Never Used  Substance and Sexual Activity   Alcohol use: No   Drug use: No   Sexual activity: Not on file  Other Topics  Concern   Not on file  Social History Narrative   Pt lives in [place] with spouse and 1 child   2 children  1988, 2000 .     Works in Consulting civil engineer at [employer].   Social Drivers of Corporate investment banker Strain: Not on [employer] Insecurity: Not on file  Transportation Needs: Not on file  Physical Activity: Not on file  Stress: Not on file  Social Connections: Not on file  Intimate Partner Violence: Not on file     Current Outpatient Medications  Medication Instructions   allopurinol  (ZYLOPRIM ) 200 mg, Oral, Daily   Ascorbic Acid (VITAMIN C PO) 2 tablets, 2 times daily   atorvastatin  (LIPITOR) 20 MG tablet TAKE 1 TABLET BY MOUTH DAILY   azelastine  (ASTELIN ) 0.1 % nasal spray 2 sprays, Each Nare, At bedtime PRN   Cholecalciferol  50 MCG (2000 UT) CAPS 3 capsules, Daily   colchicine  0.6 mg, Oral, 2 times daily PRN   Continuous Glucose Sensor (DEXCOM G7 SENSOR) MISC Change every 10 days   empagliflozin  (JARDIANCE ) 25 mg, Oral, Daily   famotidine  (PEPCID ) 20 mg, Oral, Daily at bedtime   fenofibrate  160 MG tablet TAKE 1 TABLET(160 MG) BY MOUTH DAILY   fluticasone  (FLONASE ) 50 MCG/ACT nasal spray 2 sprays, Each Nare, Daily   glucose blood (CONTOUR NEXT TEST) test strip CHECK BLOOD SUGAR  DAILY AS DIRECTED   icosapent  Ethyl (VASCEPA ) 1 g capsule TAKE 2 CAPSULES(2 GRAMS) BY MOUTH TWICE DAILY   levothyroxine  (SYNTHROID ) 137 mcg, Oral, Daily before breakfast   lisinopril  (ZESTRIL ) 5 mg, Oral, Daily   Magnesium  500 MG TABS 1 tablet, Daily   metFORMIN  (GLUCOPHAGE ) 1000 MG tablet TAKE 1 TABLET(1000 MG) BY MOUTH TWICE DAILY WITH A MEAL   Microlet Lancets MISC USE TO CHECK BLOOD SUGAR THREE TIMES DAILY   pantoprazole  (PROTONIX ) 40 mg, Oral, Daily before breakfast   Semaglutide  (2 MG/DOSE) 2 mg, Injection, Weekly   Sennosides-Docusate Sodium  (SENNA-DOCUSATE SODIUM  PO) 1 tablet, As needed   Zinc 50 MG CAPS 1 capsule, Daily       Objective:   Physical Exam BP 122/76   Pulse 86   Temp 98.3 F (36.8 C) (Oral)   Resp 16   Ht 6\' 1"  (1.854 m)   Wt 221 lb 6 oz (100.4 kg)   SpO2 97%   BMI 29.21 kg/m  General: Well developed, NAD, BMI noted Neck: No  thyromegaly  HEENT:  Normocephalic . Face symmetric, atraumatic Lungs:  CTA B Normal respiratory effort, no intercostal retractions, no accessory muscle use. Heart: RRR,  no murmur.  Abdomen:  Not distended, soft, non-tender. No rebound or rigidity.   Lower extremities: no pretibial edema bilaterally  Skin: Exposed areas without rash. Not pale. Not jaundice Neurologic:  alert & oriented X3.  Speech normal, gait appropriate for age and unassisted Strength symmetric and appropriate for age.  Psych: Cognition and judgment appear intact.  Cooperative with normal attention  span and concentration.  Behavior appropriate. No anxious or depressed appearing.     Assessment    Problem list: DM Dr. Hubert Madden since 07-2014 Hyperlipidemia Hypothyroidism Hyperparathyroidism A flutter, ablation 12-2012, then  Paroxysmal Afib, s/p Maze-PFO closure-[place] 02-2016, not anticoagulated GERD Depression Obesity Gout [name], CPAP since 2012; d/c after wt loss 2019  PLAN Here for CPX -Td  2018 -pnm 23: 2015; prevnar 2016 -shingrex x2 -Vaccines are recommended  Covid booster,  flu shot every fall   -CCS: Cscope 08-2005: int.  hemorrhoids,  Cscope 05/2018, cscope 05/2021, no polyps next 2028 per Cscope report -Prostate cancer screening: no sxs , check PSA -Labs reviewed: Will get AST ALT CBC uric acid PSA -Lifestyle: Has not been able to exercise lately due to his busy schedule, thinks he is losing muscle mass.  Rec some lifting to keep his muscle mass. Healthcare POA: Information provided Also discussed the following: DM: Per Endo A-fib: Saw cardiology 05/30/2023, no changes made Gout: Check a uric acid, we could decrease allopurinol  dose depending on results( will rec colchicine  preemptively during the transition time) RTC 1 year

## 2023-10-01 ENCOUNTER — Encounter: Payer: Self-pay | Admitting: Internal Medicine

## 2023-10-01 LAB — URIC ACID: Uric Acid: 4.9 mg/dL (ref 3.8–8.4)

## 2023-10-01 LAB — CBC WITH DIFFERENTIAL/PLATELET
Basophils Absolute: 0 10*3/uL (ref 0.0–0.2)
Basos: 1 %
EOS (ABSOLUTE): 0.1 10*3/uL (ref 0.0–0.4)
Eos: 2 %
Hematocrit: 45.1 % (ref 37.5–51.0)
Hemoglobin: 15.1 g/dL (ref 13.0–17.7)
Immature Grans (Abs): 0 10*3/uL (ref 0.0–0.1)
Immature Granulocytes: 0 %
Lymphocytes Absolute: 1.5 10*3/uL (ref 0.7–3.1)
Lymphs: 24 %
MCH: 29.3 pg (ref 26.6–33.0)
MCHC: 33.5 g/dL (ref 31.5–35.7)
MCV: 88 fL (ref 79–97)
Monocytes Absolute: 0.6 10*3/uL (ref 0.1–0.9)
Monocytes: 10 %
Neutrophils Absolute: 3.9 10*3/uL (ref 1.4–7.0)
Neutrophils: 63 %
Platelets: 229 10*3/uL (ref 150–450)
RBC: 5.15 x10E6/uL (ref 4.14–5.80)
RDW: 12.9 % (ref 11.6–15.4)
WBC: 6.2 10*3/uL (ref 3.4–10.8)

## 2023-10-01 LAB — AST: AST: 14 IU/L (ref 0–40)

## 2023-10-01 LAB — PSA: Prostate Specific Ag, Serum: 3.6 ng/mL (ref 0.0–4.0)

## 2023-10-01 LAB — ALT: ALT: 19 IU/L (ref 0–44)

## 2023-10-01 NOTE — Assessment & Plan Note (Signed)
 Here for CPX -Td  2018 -pnm 23: 2015; prevnar 2016 -shingrex x2 -Vaccines are recommended  Covid booster,  flu shot every fall   -CCS: Cscope 08-2005: int.  hemorrhoids, Cscope 05/2018, cscope 05/2021, no polyps next 2028 per Cscope report -Prostate cancer screening: no sxs , check PSA -Labs reviewed: Will get AST ALT CBC uric acid PSA -Lifestyle: Has not been able to exercise lately due to his busy schedule, thinks he is losing muscle mass.  Rec some lifting to keep his muscle mass. Healthcare POA: Information provided

## 2023-10-01 NOTE — Assessment & Plan Note (Signed)
 Here for CPX  Also discussed the following: DM: Per Endo A-fib: Saw cardiology 05/30/2023, no changes made Gout: Check a uric acid, we could decrease allopurinol  dose depending on results( will rec colchicine  preemptively during the transition time) RTC 1 year

## 2023-10-03 ENCOUNTER — Encounter: Payer: Self-pay | Admitting: Internal Medicine

## 2023-10-03 NOTE — Addendum Note (Signed)
 Addended by: Olyn Landstrom D on: 10/03/2023 01:35 PM   Modules accepted: Orders

## 2023-10-31 ENCOUNTER — Encounter: Payer: Self-pay | Admitting: Endocrinology

## 2023-11-01 ENCOUNTER — Other Ambulatory Visit: Payer: Self-pay

## 2023-11-01 DIAGNOSIS — E119 Type 2 diabetes mellitus without complications: Secondary | ICD-10-CM

## 2023-11-01 MED ORDER — SEMAGLUTIDE (2 MG/DOSE) 8 MG/3ML ~~LOC~~ SOPN: 2.0000 mg | PEN_INJECTOR | SUBCUTANEOUS | 0 refills | Status: DC

## 2023-11-07 ENCOUNTER — Other Ambulatory Visit (HOSPITAL_COMMUNITY): Payer: Self-pay

## 2023-11-08 ENCOUNTER — Other Ambulatory Visit (HOSPITAL_COMMUNITY): Payer: Self-pay

## 2023-11-08 ENCOUNTER — Telehealth: Payer: Self-pay | Admitting: Pharmacy Technician

## 2023-11-08 NOTE — Telephone Encounter (Signed)
 Pharmacy Patient Advocate Encounter   Received notification from CoverMyMeds that prior authorization for Ozempic  (2 MG/DOSE) 8MG /3ML pen-injectors is required/requested.   Insurance verification completed.   The patient is insured through [name] .   Per test claim: Refill too soon. PA is not needed at this time. Medication was filled 06/0/2025. Next eligible fill date is 11/25/2023.

## 2023-11-15 ENCOUNTER — Other Ambulatory Visit: Payer: Self-pay | Admitting: Internal Medicine

## 2023-12-09 ENCOUNTER — Other Ambulatory Visit: Payer: Self-pay | Admitting: Internal Medicine

## 2023-12-26 ENCOUNTER — Other Ambulatory Visit: Payer: Self-pay

## 2023-12-26 DIAGNOSIS — E063 Autoimmune thyroiditis: Secondary | ICD-10-CM

## 2023-12-26 MED ORDER — LEVOTHYROXINE SODIUM 137 MCG PO TABS: 137.0000 ug | ORAL_TABLET | Freq: Every day | ORAL | 3 refills | Status: DC

## 2023-12-27 ENCOUNTER — Other Ambulatory Visit: Payer: Self-pay | Admitting: Internal Medicine

## 2024-01-11 ENCOUNTER — Other Ambulatory Visit: Payer: Self-pay | Admitting: Internal Medicine

## 2024-01-13 ENCOUNTER — Encounter: Payer: Self-pay | Admitting: Endocrinology

## 2024-01-14 LAB — LAB REPORT - SCANNED: A1c: 6.2

## 2024-01-19 ENCOUNTER — Encounter: Payer: Self-pay | Admitting: Endocrinology

## 2024-01-19 ENCOUNTER — Ambulatory Visit: Admitting: Endocrinology

## 2024-01-19 VITALS — BP 116/70 | HR 81 | Resp 20 | Ht 73.0 in | Wt 220.4 lb

## 2024-01-19 DIAGNOSIS — E063 Autoimmune thyroiditis: Secondary | ICD-10-CM

## 2024-01-19 DIAGNOSIS — E119 Type 2 diabetes mellitus without complications: Secondary | ICD-10-CM | POA: Diagnosis not present

## 2024-01-19 DIAGNOSIS — Z7985 Long-term (current) use of injectable non-insulin antidiabetic drugs: Secondary | ICD-10-CM

## 2024-01-19 DIAGNOSIS — E782 Mixed hyperlipidemia: Secondary | ICD-10-CM | POA: Diagnosis not present

## 2024-01-19 DIAGNOSIS — E559 Vitamin D deficiency, unspecified: Secondary | ICD-10-CM

## 2024-01-19 DIAGNOSIS — Z7984 Long term (current) use of oral hypoglycemic drugs: Secondary | ICD-10-CM

## 2024-01-19 NOTE — Progress Notes (Signed)
 Outpatient Endocrinology Note [place] Gaia Gullikson, MD   Patient's Name: Colin Wood    DOB: 27-Jul-1960    MRN: [id number]                                                    REASON OF VISIT: Follow up for type 2 diabetes mellitus  PCP: Amon Aloysius BRAVO, MD  HISTORY OF PRESENT ILLNESS:   Colin Wood is a 63 y.o. old male with past medical history listed below, is here for follow up for type 2 diabetes mellitus.   Pertinent Diabetes History: Patient was previously seen by Deitra Sor and was last time seen in August 2024.  Patient was diagnosed with type 2 diabetes mellitus around 2011.  Chronic Diabetes Complications : Retinopathy: no. Last ophthalmology exam was done on annually, following with ophthalmology regularly.  Nephropathy: no, on ACE/ARB / lisinopril  Peripheral neuropathy: no Coronary artery disease: no Stroke: no  Relevant comorbidities and cardiovascular risk factors: Obesity: yes Body mass index is 29.08 kg/m.  Hypertension: Yes  Hyperlipidemia : Yes, on statin   Current / Home Diabetic regimen includes:  Oral hypoglycemic drugs the patient is taking are: metformin  1 g twice a day, Ozempic  2 mg weekly, Jardiance  25 mg daily       Prior diabetic medications: Amaryl , Tradjenta  in the past.  Trulicity  was changed to Ozempic  because of lack of availability of Trulicity  in the past.  Glycemic data:    CONTINUOUS GLUCOSE MONITORING SYSTEM (CGMS) INTERPRETATION: At today's visit, we reviewed CGM downloads. The full report is scanned in the media. Reviewing the CGM trends, blood glucose are as follows:  Dexcom G7 CGM-  Sensor Download (Sensor download was reviewed and summarized below.) Dates: August 8 to August 21 , 2025, 14 days  Glucose Management Indicator: 7.3%  Sensor usage : 85 %     Interpretation: Frequent postprandial mild hyperglycemia with blood sugars in low 200 range postprandially.  Most of the blood sugars overnight and in between the meals  are acceptable.  No hypoglycemia.  Hypoglycemia: Patient has no hypoglycemic episodes. Patient has hypoglycemia awareness.  Factors modifying glucose control: 1.  Diabetic diet assessment: 3 meals a day.  2.  Staying active or exercising:   3.  Medication compliance: compliant all of the time.  # Primary hypothyroidism -Diagnosed in 1994.  He has been taking levothyroxine  137 mcg daily.  # Vitamin D  deficiency currently taking vitamin D3 2000 international unit daily.  # HYPERCALCEMIA:  For quite some time calcium  is slightly high or upper normal, also note that his albumin  level is slightly high PTH level has been normal, last 26 in 2021. No history of kidney stones or known osteopenia  Interval history  Dexcom data as reviewed above.  Postprandial mild hyperglycemia.  Patient reports he had hemoglobin A1c of 6.2% 1 week ago, checked at [facility].  He will fax result to our clinic.  He reports his cholesterol level was also slightly elevated, no reports available to review.  GMI on CGM 7.3%.  He has been taking levothyroxine  137 mcg daily.  No hypo and hyperthyroid symptoms.  He denies numbness and tingling of the feet.  No vision problem.  No other complaints today.  He works for [employer] and likes to do lab at [employer].  REVIEW OF SYSTEMS As per  history of present illness.   PAST MEDICAL HISTORY: Past Medical History:  Diagnosis Date   Anxiety    on meds   Atrial flutter (HCC)    typical appearing; s/p ablation 12-2012   Constipation    Depression    hx of   Diabetes mellitus    TYPE 2-on meds   Fatty liver    GERD (gastroesophageal reflux disease)    Gout    Hyperlipidemia    Hypothyroidism    on meds   Leg edema    Obesity    Persistent atrial fibrillation (HCC)    PVI 12/2012   Prediabetes    Rhinitis    vasomotor   S/P Minimally invasive maze operation for atrial fibrillation 03/12/2016   Complete bilateral atrial lesion set using cryothermy and bipolar  radiofrequency ablation with clipping of LA appendage via right mini thoracotomy approach   S/P patent foramen ovale closure 03/12/2016   Sleep apnea    mild, now treated with CPAP since ~9-12    PAST SURGICAL HISTORY: Past Surgical History:  Procedure Laterality Date   ABLATION OF DYSRHYTHMIC FOCUS  01/05/2013   PVI and flutter ablation by Dr Kelsie   ATRIAL FIBRILLATION ABLATION N/A 01/05/2013   Procedure: ATRIAL FIBRILLATION ABLATION;  Surgeon: Lynwood Kelsie, MD;  Location: [facility] CATH LAB;  Service: Cardiovascular;  Laterality: N/A;   CARDIAC CATHETERIZATION N/A 01/12/2016   Procedure: Right/Left Heart Cath and Coronary Angiography;  Surgeon: Lonni Hanson, MD;  Location: [facility] INVASIVE CV LAB;  Service: Cardiovascular;  Laterality: N/A;   CARDIOVERSION  03/01/2011   CLIPPING OF ATRIAL APPENDAGE Left 03/12/2016   Procedure: CLIPPING OF ATRIAL APPENDAGE;  Surgeon: Sudie VEAR Laine, MD;  Location: [facility] OR;  Service: Open Heart Surgery;  Laterality: Left;   COLONOSCOPY  2020   HD-suprep(good)   MINIMALLY INVASIVE MAZE PROCEDURE N/A 03/12/2016   Procedure: MINIMALLY INVASIVE MAZE PROCEDURE;  Surgeon: Sudie VEAR Laine, MD;  Location: [facility] OR;  Service: Open Heart Surgery;  Laterality: N/A;   NASAL SINUS SURGERY     TEE WITHOUT CARDIOVERSION N/A 01/04/2013   Procedure: TRANSESOPHAGEAL ECHOCARDIOGRAM (TEE);  Surgeon: Vina LULLA Gull, MD;  Location: [facility] ENDOSCOPY;  Service: Cardiovascular;  Laterality: N/A;   TEE WITHOUT CARDIOVERSION N/A 03/12/2016   Procedure: TRANSESOPHAGEAL ECHOCARDIOGRAM (TEE);  Surgeon: Sudie VEAR Laine, MD;  Location: [facility] OR;  Service: Open Heart Surgery;  Laterality: N/A;   TONSILLECTOMY     VASECTOMY     WISDOM TOOTH EXTRACTION      ALLERGIES: No Known Allergies  FAMILY HISTORY:  Family History  Problem Relation Age of Onset   Asthma Mother    Breast cancer Mother    Dementia Mother    Colon polyps Father 66   Diabetes Father    Hyperlipidemia Father    Obesity Father     Atrial fibrillation Father    Hypertension Brother    Melanoma Brother    Throat cancer Maternal Uncle    Colon polyps Paternal Grandfather 64   Colon cancer Paternal Grandfather 36   Allergies Daughter    Prostate cancer Neg Hx    Esophageal cancer Neg Hx    Stomach cancer Neg Hx    Rectal cancer Neg Hx     SOCIAL HISTORY: Social History   Socioeconomic History   Marital status: Married    Spouse name: Avelina   Number of children: 2   Years of education: Not on file   Highest education level: Not on file  Occupational History   Occupation: [employer]  Tobacco Use   Smoking status: Never   Smokeless tobacco: Never  Vaping Use   Vaping status: Never Used  Substance and Sexual Activity   Alcohol use: No   Drug use: No   Sexual activity: Not on file  Other Topics Concern   Not on file  Social History Narrative   Pt lives in [place] with spouse and 1 child   2 children  1988, 2000 .     Works in Consulting civil engineer at [employer].   Social Drivers of Corporate investment banker Strain: Not on [employer] Insecurity: Not on file  Transportation Needs: Not on file  Physical Activity: Not on file  Stress: Not on file  Social Connections: Not on file    MEDICATIONS:  Current Outpatient Medications  Medication Sig Dispense Refill   allopurinol  (ZYLOPRIM ) 100 MG tablet Take 2 tablets (200 mg total) by mouth daily. (Patient taking differently: Take 100 mg by mouth daily.) 180 tablet 2   Ascorbic Acid (VITAMIN C PO) Take 2 tablets by mouth 2 (two) times daily.     atorvastatin  (LIPITOR) 20 MG tablet TAKE 1 TABLET BY MOUTH DAILY 90 tablet 2   azelastine  (ASTELIN ) 0.1 % nasal spray Place 2 sprays into both nostrils at bedtime as needed for rhinitis or allergies. 30 mL 3   Cholecalciferol 50 MCG (2000 UT) CAPS Take 3 capsules by mouth daily at 6 (six) AM.     colchicine  0.6 MG tablet Take 1 tablet (0.6 mg total) by mouth 2 (two) times daily as needed. 60 tablet 1   Continuous Glucose  Sensor (DEXCOM G7 SENSOR) MISC Change every 10 days 9 each 3   empagliflozin  (JARDIANCE ) 25 MG TABS tablet Take 1 tablet (25 mg total) by mouth daily. 90 tablet 3   famotidine  (PEPCID ) 20 MG tablet Take 1 tablet (20 mg total) by mouth at bedtime. 90 tablet 2   fenofibrate  160 MG tablet TAKE 1 TABLET(160 MG) BY MOUTH DAILY 90 tablet 2   fluticasone  (FLONASE ) 50 MCG/ACT nasal spray Place 2 sprays into both nostrils daily. 16 mL 0   glucose blood (CONTOUR NEXT TEST) test strip CHECK BLOOD SUGAR  DAILY AS DIRECTED 50 strip 3   icosapent  Ethyl (VASCEPA ) 1 g capsule TAKE 2 CAPSULES(2 GRAMS) BY MOUTH TWICE DAILY 360 capsule 3   levothyroxine  (SYNTHROID ) 137 MCG tablet Take 1 tablet (137 mcg total) by mouth daily before breakfast. 90 tablet 3   lisinopril  (ZESTRIL ) 5 MG tablet Take 1 tablet (5 mg total) by mouth daily. 90 tablet 2   Magnesium  500 MG TABS Take 1 tablet by mouth daily in the afternoon.     metFORMIN  (GLUCOPHAGE ) 1000 MG tablet TAKE 1 TABLET(1000 MG) BY MOUTH TWICE DAILY WITH A MEAL 180 tablet 3   Microlet Lancets MISC USE TO CHECK BLOOD SUGAR THREE TIMES DAILY 100 each 3   pantoprazole  (PROTONIX ) 40 MG tablet Take 1 tablet (40 mg total) by mouth daily before breakfast. 90 tablet 2   Semaglutide , 2 MG/DOSE, 8 MG/3ML SOPN Inject 2 mg as directed once a week. 12 mL 0   Sennosides-Docusate Sodium  (SENNA-DOCUSATE SODIUM  PO) Take 1 tablet by mouth as needed.     No current facility-administered medications for this visit.    PHYSICAL EXAM: Vitals:   01/19/24 0812  BP: 116/70  Pulse: 81  Resp: 20  SpO2: 96%  Weight: 220 lb 6.4 oz (100 kg)  Height:  6' 1 (1.854 m)    Body mass index is 29.08 kg/m.  Wt Readings from Last 3 Encounters:  01/19/24 220 lb 6.4 oz (100 kg)  09/30/23 221 lb 6 oz (100.4 kg)  09/19/23 226 lb 9.6 oz (102.8 kg)    General: Well developed, well nourished male in no apparent distress.  HEENT: AT/[place], no external lesions.  Eyes: Conjunctiva clear and no  icterus. Neck: Neck supple  Lungs: Respirations not labored Neurologic: Alert, oriented, normal speech Extremities / Skin: Dry.  Psychiatric: Does not appear depressed or anxious  Diabetic Foot Exam - Simple   No data filed     LABS Reviewed Lab Results  Component Value Date   HGBA1C 6.2 (H) 09/13/2023   HGBA1C 6.2 (A) 05/18/2023   HGBA1C 6.2 (H) 01/03/2023   No results found for: FRUCTOSAMINE Lab Results  Component Value Date   CHOL 169 09/13/2023   HDL 46 09/13/2023   LDLCALC 96 09/13/2023   TRIG 156 (H) 09/13/2023   CHOLHDL 3.6 09/17/2022   Lab Results  Component Value Date   MICRALBCREAT 4 09/13/2023   MICRALBCREAT <4 09/24/2022   Lab Results  Component Value Date   CREATININE 1.04 09/13/2023   No results found for: GFR  ASSESSMENT / PLAN  1. Controlled type 2 diabetes mellitus without complication, without long-term current use of insulin  (HCC)   2. Acquired autoimmune hypothyroidism   3. Vitamin D  deficiency   4. Mixed hyperlipidemia     Diabetes Mellitus type 2, complicated by no known complications. - Diabetic status / severity: Controlled.  Lab Results  Component Value Date   HGBA1C 6.2 (H) 09/13/2023    - Hemoglobin A1c goal : <6.5%  Reportedly hemoglobin A1c was 6.2% 1 week ago.  Discussed that his current diabetic medications are at max dose.  Discussed about limiting carbohydrates and portion control to avoid hyperglycemia postprandially.  - Medications: See below.  No change.  I) continue Jardiance  25 mg daily. II) continue Ozempic  2 mg weekly. III) continue metformin  1000 mg 2 times a day.  - Home glucose testing: CGM and check as needed, Dexcom G7.  - Discussed/ Gave Hypoglycemia treatment plan.  # Consult : not required at this time.   # Annual urine for microalbuminuria/ creatinine ratio, no microalbuminuria currently. Last  Lab Results  Component Value Date   MICRALBCREAT 4 09/13/2023    # Foot check nightly.  #  Annual dilated diabetic eye exams.   - Diet: Make healthy diabetic food choices - Life style / activity / exercise: Discussed.  2. Blood pressure  -  BP Readings from Last 1 Encounters:  01/19/24 116/70    - Control is in target.  - No change in current plans.  3. Lipid status / Hyperlipidemia - Last  Lab Results  Component Value Date   LDLCALC 96 09/13/2023   - Continue atorvastatin  20 mg daily.  Continue fenofibrate  and Vascepa .    # Hypothyroidism : Continue levothyroxine  137 mcg daily.  Thyroid  function test in every 6 to 12 months.  # Vitamin D  deficiency, continue vitamin D3 2000 international units daily.  Okay to take increased dose of vitamin D3 during winter months.   Diagnoses and all orders for this visit:  Controlled type 2 diabetes mellitus without complication, without long-term current use of insulin  (HCC) -     Hemoglobin A1c -     Comprehensive metabolic panel with GFR  Acquired autoimmune hypothyroidism -     T4, free -  TSH  Vitamin D  deficiency -     VITAMIN D  25 Hydroxy (Vit-D Deficiency, Fractures)  Mixed hyperlipidemia -     Lipid panel    He prefers to do labs at [facility] when needed.  Labs prior to follow-up visit as ordered.  Will complete lab prior to follow-up visit and bring the results in the time of visit.  DISPOSITION Follow up in clinic in 4  months suggested.   All questions answered and patient verbalized understanding of the plan.  [place] Jameria Bradway, MD [facility] Endocrinology [facility] [address] [place], [address] [place], [place] [address] Phone # [phone]  At least part of this note was generated using voice recognition software. Inadvertent word errors may have occurred, which were not recognized during the proofreading process.  Addendum: Outside lab results completed on January 13, 2024 received and reviewed as follows.  Hemoglobin A1c 6.2%.  Lipids level LDL 115, total cholesterol 194, triglyceride 191,  HDL 46, VLDL 33.

## 2024-02-02 ENCOUNTER — Other Ambulatory Visit: Payer: Self-pay | Admitting: Endocrinology

## 2024-02-02 DIAGNOSIS — E1169 Type 2 diabetes mellitus with other specified complication: Secondary | ICD-10-CM

## 2024-02-02 NOTE — Telephone Encounter (Signed)
 Refill request complete

## 2024-02-22 ENCOUNTER — Other Ambulatory Visit: Payer: Self-pay | Admitting: Endocrinology

## 2024-02-22 DIAGNOSIS — E669 Obesity, unspecified: Secondary | ICD-10-CM

## 2024-02-29 ENCOUNTER — Encounter: Payer: Self-pay | Admitting: Endocrinology

## 2024-02-29 ENCOUNTER — Other Ambulatory Visit: Payer: Self-pay

## 2024-02-29 DIAGNOSIS — E119 Type 2 diabetes mellitus without complications: Secondary | ICD-10-CM

## 2024-02-29 MED ORDER — SEMAGLUTIDE (2 MG/DOSE) 8 MG/3ML ~~LOC~~ SOPN: 2.0000 mg | PEN_INJECTOR | SUBCUTANEOUS | 0 refills | Status: DC

## 2024-03-27 ENCOUNTER — Encounter: Payer: Self-pay | Admitting: Internal Medicine

## 2024-03-27 DIAGNOSIS — G4733 Obstructive sleep apnea (adult) (pediatric): Secondary | ICD-10-CM

## 2024-04-24 ENCOUNTER — Encounter: Payer: Self-pay | Admitting: Neurology

## 2024-04-24 ENCOUNTER — Ambulatory Visit: Admitting: Neurology

## 2024-04-24 VITALS — BP 133/74 | HR 87 | Ht 73.0 in | Wt 218.0 lb

## 2024-04-24 DIAGNOSIS — G4719 Other hypersomnia: Secondary | ICD-10-CM

## 2024-04-24 DIAGNOSIS — G4733 Obstructive sleep apnea (adult) (pediatric): Secondary | ICD-10-CM

## 2024-04-24 DIAGNOSIS — E663 Overweight: Secondary | ICD-10-CM

## 2024-04-24 DIAGNOSIS — R351 Nocturia: Secondary | ICD-10-CM

## 2024-04-24 DIAGNOSIS — I4891 Unspecified atrial fibrillation: Secondary | ICD-10-CM

## 2024-04-24 DIAGNOSIS — R519 Headache, unspecified: Secondary | ICD-10-CM | POA: Diagnosis not present

## 2024-04-24 DIAGNOSIS — I4892 Unspecified atrial flutter: Secondary | ICD-10-CM

## 2024-04-24 NOTE — Patient Instructions (Signed)
Thank you for choosing [facility] for your sleep related care! It was nice to meet you today!   Here is what we discussed today:    Based on your symptoms and your exam I believe you may still be at some risk for obstructive sleep apnea (aka [name]). We should proceed with a sleep study to determine whether you do or do not have [name] and how severe it is. Even, if you have mild [name], I may want you to consider treatment with CPAP, as treatment of even borderline or mild sleep apnea can result and improvement of symptoms such as sleep disruption, daytime sleepiness, nighttime bathroom breaks, restless leg symptoms, improvement of headache syndromes, even improved mood disorder.   As explained, an attended sleep study (meaning you get to stay overnight in the sleep lab), lets [place] monitor sleep-related behaviors such as sleep talking and leg movements in sleep, in addition to monitoring for sleep apnea.  A home sleep test is a screening tool for sleep apnea diagnosis only, but unfortunately, does not help with any other sleep-related diagnoses.  Please remember, the long-term risks and ramifications of untreated moderate to severe obstructive sleep apnea may include (but are not limited to): increased risk for cardiovascular disease, including congestive heart failure, stroke, difficult to control hypertension, treatment resistant obesity, arrhythmias, especially irregular heartbeat commonly known as A. Fib. (atrial fibrillation); even type 2 diabetes has been linked to untreated [name].   Other correlations that untreated obstructive sleep apnea include macular edema which is swelling of the retina in the eyes, droopy eyelid syndrome, and elevated hemoglobin and hematocrit levels (often referred to as polycythemia).  Sleep apnea can cause disruption of sleep and sleep deprivation in most cases, which, in turn, can cause recurrent headaches, problems with memory, mood, concentration, focus, and  vigilance. Most people with untreated sleep apnea report excessive daytime sleepiness, which can affect their ability to drive. Please do not drive or use heavy equipment or machinery, if you feel sleepy! Patients with sleep apnea can also develop difficulty initiating and maintaining sleep (aka insomnia).   Having sleep apnea may increase your risk for other sleep disorders, including involuntary behaviors sleep such as sleep terrors, sleep talking, sleepwalking.    Having sleep apnea can also increase your risk for restless leg syndrome and leg movements at night.   Please note that untreated obstructive sleep apnea may carry additional perioperative morbidity. Patients with significant obstructive sleep apnea (typically, in the moderate to severe degree) should receive, if possible, perioperative PAP (positive airway pressure) therapy and the surgeons and particularly the anesthesiologists should be informed of the diagnosis and the severity of the sleep disordered breathing.   We will call you or email you through [place] with regards to your test results and plan a follow-up in sleep clinic accordingly. Most likely, you will hear from one of our nurses.   Our sleep lab administrative assistant will call you to schedule your sleep study and give you further instructions, regarding the check in process for the sleep study, arrival time, what to bring, when you can expect to leave after the study, etc., and to answer any other logistical questions you may have. If you don't hear back from her by about 2 weeks from now, please feel free to call her direct line at [phone] or you can call our general clinic number, or email [place] through My Chart.

## 2024-04-24 NOTE — Progress Notes (Signed)
 Subjective:    Patient ID: Colin Wood is a 63 y.o. male.  HPI    True Mar, MD, PhD [facility] [address] [address] [place], [place] [address]  Dear Dr. Amon,  I saw your patient, Colin Wood, upon your kind request in my sleep clinic today for initial consultation of his sleep disorder, in particular, concern for underlying obstructive sleep apnea.  The patient is unaccompanied today.  As you know, Colin Wood is a 63 year old male with an underlying medical history of hypertension, leg edema, anxiety, diabetes, hyperlipidemia, atrial fibrillation, history of atrial flutter, vitamin D  deficiency, reflux disease, hypothyroidism, status post PFO closure, status post cardioversion, status post cardiac ablation, and overweight state, who was previously diagnosed with obstructive sleep apnea and placed on PAP therapy.  I was able to review his sleep study report from 01/10/2011.  He had a in-lab sleep study at [facility] and study was interpreted by Dr. Bedford.  His total AHI was 6.8/h at the time, O2 nadir was 89%.  He stopped using a CPAP machine several years ago due to significant weight loss achieved.  At the time of his prior sleep study his weight was 270 pounds, currently 218.  He reports snoring and symptoms of gasping for air at night.  His Epworth sleepiness score is 14 out of 24, fatigue severity score is 49 out of 63.  He had trouble tolerating CPAP in the past.  He would be willing to get reevaluated.  He has snoring, he is a restless sleeper and tosses and turns a lot, is a side sleeper.  He starts off in a recliner for a few hours and then goes to bed.  He takes Benadryl as needed for allergy symptoms and nasal congestion.  He has rare morning headaches and nocturia about twice per average night.  He is not aware of any family history of sleep apnea.  His wife has a CPAP machine.  A couple of times he has woken up  with a sense of gasping for air in the past couple of months.  He lives with his wife and his younger daughter, age 65.  His older daughter is 67 and is on her own.  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  He does take a nap almost daily at lunch.  Bedtime is generally between 9 and 11 and rise time between 6 and 6:30 AM.  He is a non-smoker and does not drink any alcohol and drinks quite a bit of caffeine in the form of diet soda, 6 to 12 cans/day, as late as bedtime.  He has a history of motor tics since he was a teenager.  His Past Medical History Is Significant For: Past Medical History:  Diagnosis Date   Anxiety    on meds   Atrial flutter (HCC)    typical appearing; s/p ablation 12-2012   Constipation    Depression    hx of   Diabetes mellitus    TYPE 2-on meds   Fatty liver    GERD (gastroesophageal reflux disease)    Gout    Hyperlipidemia    Hypothyroidism    on meds   Leg edema    Obesity    Persistent atrial fibrillation (HCC)    PVI 12/2012   Prediabetes    Rhinitis    vasomotor   S/P Minimally invasive maze operation for atrial fibrillation 03/12/2016   Complete bilateral atrial lesion  set using cryothermy and bipolar radiofrequency ablation with clipping of LA appendage via right mini thoracotomy approach   S/P patent foramen ovale closure 03/12/2016   Sleep apnea    mild, now treated with CPAP since ~9-12    His Past Surgical History Is Significant For: Past Surgical History:  Procedure Laterality Date   ABLATION OF DYSRHYTHMIC FOCUS  01/05/2013   PVI and flutter ablation by Dr Kelsie   ATRIAL FIBRILLATION ABLATION N/A 01/05/2013   Procedure: ATRIAL FIBRILLATION ABLATION;  Surgeon: Lynwood Kelsie, MD;  Location: [facility] CATH LAB;  Service: Cardiovascular;  Laterality: N/A;   CARDIAC CATHETERIZATION N/A 01/12/2016   Procedure: Right/Left Heart Cath and Coronary Angiography;  Surgeon: Lonni Hanson, MD;  Location: [facility] INVASIVE CV LAB;  Service: Cardiovascular;  Laterality:  N/A;   CARDIOVERSION  03/01/2011   CLIPPING OF ATRIAL APPENDAGE Left 03/12/2016   Procedure: CLIPPING OF ATRIAL APPENDAGE;  Surgeon: Sudie VEAR Laine, MD;  Location: [facility] OR;  Service: Open Heart Surgery;  Laterality: Left;   COLONOSCOPY  2020   HD-suprep(good)   MINIMALLY INVASIVE MAZE PROCEDURE N/A 03/12/2016   Procedure: MINIMALLY INVASIVE MAZE PROCEDURE;  Surgeon: Sudie VEAR Laine, MD;  Location: [facility] OR;  Service: Open Heart Surgery;  Laterality: N/A;   NASAL SINUS SURGERY     TEE WITHOUT CARDIOVERSION N/A 01/04/2013   Procedure: TRANSESOPHAGEAL ECHOCARDIOGRAM (TEE);  Surgeon: Vina LULLA Gull, MD;  Location: [facility] ENDOSCOPY;  Service: Cardiovascular;  Laterality: N/A;   TEE WITHOUT CARDIOVERSION N/A 03/12/2016   Procedure: TRANSESOPHAGEAL ECHOCARDIOGRAM (TEE);  Surgeon: Sudie VEAR Laine, MD;  Location: [facility] OR;  Service: Open Heart Surgery;  Laterality: N/A;   TONSILLECTOMY     VASECTOMY     WISDOM TOOTH EXTRACTION      His Family History Is Significant For: Family History  Problem Relation Age of Onset   Asthma Mother    Breast cancer Mother    Dementia Mother    Colon polyps Father 28   Diabetes Father    Hyperlipidemia Father    Obesity Father    Atrial fibrillation Father    Hypertension Brother    Melanoma Brother    Throat cancer Maternal Uncle    Colon polyps Paternal Grandfather 38   Colon cancer Paternal Grandfather 11   Allergies Daughter    Prostate cancer Neg Hx    Esophageal cancer Neg Hx    Stomach cancer Neg Hx    Rectal cancer Neg Hx     His Social History Is Significant For: Social History   Socioeconomic History   Marital status: Married    Spouse name: Colin Wood   Number of children: 2   Years of education: Not on file   Highest education level: Not on file  Occupational History   Occupation: [employer]  Tobacco Use   Smoking status: Never   Smokeless tobacco: Never  Vaping Use   Vaping status: Never Used  Substance and Sexual Activity   Alcohol  use: No   Drug use: No   Sexual activity: Not on file  Other Topics Concern   Not on file  Social History Narrative   Pt lives in [place] with spouse and 1 child   2 children  1988, 2000 .     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   Social Drivers of Corporate Investment Banker Strain: Not on [employer] Insecurity: Not on file  Transportation Needs: Not on file  Physical Activity: Not on file  Stress:  Not on file  Social Connections: Not on file    His Allergies Are:  No Known Allergies:   His Current Medications Are:  Outpatient Encounter Medications as of 04/24/2024  Medication Sig   allopurinol  (ZYLOPRIM ) 100 MG tablet Take 2 tablets (200 mg total) by mouth daily. (Patient taking differently: Take 100 mg by mouth daily.)   Ascorbic Acid (VITAMIN C PO) Take 2 tablets by mouth 2 (two) times daily.   atorvastatin  (LIPITOR) 20 MG tablet TAKE 1 TABLET BY MOUTH DAILY   azelastine  (ASTELIN ) 0.1 % nasal spray Place 2 sprays into both nostrils at bedtime as needed for rhinitis or allergies.   Cholecalciferol 50 MCG (2000 UT) CAPS Take 3 capsules by mouth daily at 6 (six) AM.   colchicine  0.6 MG tablet Take 1 tablet (0.6 mg total) by mouth 2 (two) times daily as needed.   Continuous Glucose Sensor (DEXCOM G7 SENSOR) MISC Change every 10 days   famotidine  (PEPCID ) 20 MG tablet Take 1 tablet (20 mg total) by mouth at bedtime.   fenofibrate  160 MG tablet TAKE 1 TABLET(160 MG) BY MOUTH DAILY   fluticasone  (FLONASE ) 50 MCG/ACT nasal spray Place 2 sprays into both nostrils daily.   glucose blood (CONTOUR NEXT TEST) test strip CHECK BLOOD SUGAR  DAILY AS DIRECTED   icosapent  Ethyl (VASCEPA ) 1 g capsule TAKE 2 CAPSULES(2 GRAMS) BY MOUTH TWICE DAILY   JARDIANCE  25 MG TABS tablet TAKE 1 TABLET(25 MG) BY MOUTH DAILY   levothyroxine  (SYNTHROID ) 137 MCG tablet Take 1 tablet (137 mcg total) by mouth daily before breakfast.   lisinopril  (ZESTRIL ) 5 MG tablet Take 1 tablet (5 mg total) by mouth daily.    Magnesium  500 MG TABS Take 1 tablet by mouth daily in the afternoon.   metFORMIN  (GLUCOPHAGE ) 1000 MG tablet TAKE 1 TABLET(1000 MG) BY MOUTH TWICE DAILY WITH A MEAL   Microlet Lancets MISC USE TO CHECK BLOOD SUGAR THREE TIMES DAILY   pantoprazole  (PROTONIX ) 40 MG tablet Take 1 tablet (40 mg total) by mouth daily before breakfast.   Semaglutide , 2 MG/DOSE, 8 MG/3ML SOPN Inject 2 mg as directed once a week.   Sennosides-Docusate Sodium  (SENNA-DOCUSATE SODIUM  PO) Take 1 tablet by mouth as needed.   No facility-administered encounter medications on file as of 04/24/2024.  :   Review of Systems:  Out of a complete 14 point review of systems, all are reviewed and negative with the exception of these symptoms as listed below:   Review of Systems  Objective:  Neurological Exam  Physical Exam Physical Examination:   Vitals:   04/24/24 1246  BP: 133/74  Pulse: 87    General Examination: The patient is a very pleasant 63 y.o. male in no acute distress. He appears well-developed and well-nourished and well groomed.   HEENT: Normocephalic, atraumatic, pupils are equal, round and reactive to light, extraocular tracking is good without limitation to gaze excursion or nystagmus noted. No photophobia.  Corrective eye glasses in place. Hearing is grossly intact.  Face is symmetric with normal facial animation. Speech is clear without dysarthria. There is no hypophonia. There is no lip, neck/head, jaw or voice tremor. Neck is supple with full range of passive and active motion. There are no carotid bruits on auscultation.  Airway/Oropharynx exam reveals: mild mouth dryness, adequate dental hygiene and moderate airway crowding, due to small airway entry, redundant soft palate, tonsils are absent, Mallampati class III.  Neck circumference 17-1/4 inches, mild overbite noted.  Tongue protrudes centrally and  palate elevates symmetrically.  Chest: Clear to auscultation without wheezing, rhonchi or crackles  noted.  Heart: S1+S2+0, regular and normal without murmurs, rubs or gallops noted.   Abdomen: Soft, non-tender and non-distended.  Extremities: There is no pitting edema in the distal lower extremities bilaterally.  Hyperpigmentation noted in the distal lower extremities bilaterally.  Skin: Warm and dry without trophic changes noted.   Musculoskeletal: exam reveals no obvious joint deformities.   Neurologically:  Mental status: The patient is awake, alert and oriented in all 4 spheres. His immediate and remote memory, attention, language skills and fund of knowledge are appropriate. There is no evidence of aphasia, agnosia, apraxia or anomia. Speech is clear with normal prosody and enunciation. Thought process is linear. Mood is normal and affect is normal.  Cranial nerves II - XII are as described above under HEENT exam.  Motor exam: Normal bulk, moving all 4 extremities without restriction, intermittent left shoulder take noted.  There is no obvious action or resting tremor.  Fine motor skills and coordination: Intact grossly.  Cerebellar testing: No dysmetria or intention tremor. There is no truncal or gait ataxia.  Sensory exam: intact to light touch in the upper and lower extremities.  Gait, station and balance: He stands easily. No veering to one side is noted. No leaning to one side is noted. Posture is age-appropriate and stance is narrow based. Gait shows normal stride length and normal pace. No problems turning are noted.   Assessment and plan:  In summary, Colin Wood is a very pleasant 63 y.o.-year old male with an underlying medical history of hypertension, leg edema, anxiety, diabetes, hyperlipidemia, atrial fibrillation, history of atrial flutter, vitamin D  deficiency, reflux disease, hypothyroidism, status post PFO closure, status post cardioversion, status post cardiac ablation, and overweight state, whose history and physical exam are concerning for sleep disordered  breathing, particularly obstructive sleep apnea ([name]). A laboratory attended sleep study is typically considered gold standard for evaluation of sleep disordered breathing.   I had a long chat with the patient about my findings and the diagnosis of sleep apnea, particularly [name], its prognosis and treatment options. We talked about medical/conservative treatments, surgical interventions and non-pharmacological approaches for symptom control. I explained, in particular, the risks and ramifications of untreated moderate to severe [name], especially with respect to developing cardiovascular disease down the road, including congestive heart failure (CHF), difficult to treat hypertension, cardiac arrhythmias (particularly A-fib), neurovascular complications including TIA, stroke and dementia. Even type 2 diabetes has, in part, been linked to untreated [name]. Symptoms of untreated [name] may include (but may not be limited to) daytime sleepiness, nocturia (i.e. frequent nighttime urination), memory problems, mood irritability and suboptimally controlled or worsening mood disorder such as depression and/or anxiety, lack of energy, lack of motivation, physical discomfort, as well as recurrent headaches, especially morning or nocturnal headaches. We talked about the importance of maintaining a healthy lifestyle and striving for healthy weight.  In addition, we talked about the importance of striving for and maintaining good sleep hygiene.  Furthermore, he is advised to reduce his caffeine intake as caffeine can trigger worsening anxiety, trigger atrial flutter and atrial fibrillation, cause sleep disturbance and even make his tics worse. I recommended a sleep study at this time. I outlined the differences between a laboratory attended sleep study which is considered more comprehensive and accurate over the option of a home sleep test (HST); the latter may lead to underestimation of sleep disordered breathing in some instances and  does not help with diagnosing upper airway resistance syndrome and is not accurate enough to diagnose primary central sleep apnea typically. I outlined possible surgical and non-surgical treatment options of [name], including the use of a positive airway pressure (PAP) device (i.e. CPAP, AutoPAP/APAP or BiPAP in certain circumstances), a custom-made dental device (aka oral appliance, which would require a referral to a specialist dentist or orthodontist typically, and is generally speaking not considered for patients with full dentures or edentulous state), upper airway surgical options, such as traditional UPPP (which is not considered a first-line treatment) or the Inspire device (hypoglossal nerve stimulator, which would involve a referral for consultation with an ENT surgeon, after careful selection, following inclusion criteria - also not first-line treatment). I explained the PAP treatment option to the patient in detail, as this is generally considered first-line treatment.  The patient indicated that he would be willing to try PAP therapy, if the need arises. I explained the importance of being compliant with PAP treatment, not only for insurance purposes but primarily to improve patient's symptoms symptoms, and for the patient's long term health benefit, including to reduce His cardiovascular risks longer-term.    We will pick up our discussion about the next steps and treatment options after testing.  We will keep him posted as to the test results by phone call and/or MyChart messaging where possible.  We will plan to follow-up in sleep clinic accordingly as well.  I answered all his questions today and the patient was in agreement.   I encouraged him to call with any interim questions, concerns, problems or updates or email us  through MyChart.  Generally speaking, sleep test authorizations may take up to 2 weeks, sometimes less, sometimes longer, the patient is encouraged to get in touch with us  if they  do not hear back from the sleep lab staff directly within the next 2 weeks.  Thank you very much for allowing me to participate in the care of this nice patient. If I can be of any further assistance to you please do not hesitate to call me at [phone].  Sincerely,   True Mar, MD, PhD

## 2024-05-02 ENCOUNTER — Other Ambulatory Visit: Payer: Self-pay

## 2024-05-02 DIAGNOSIS — E785 Hyperlipidemia, unspecified: Secondary | ICD-10-CM

## 2024-05-02 MED ORDER — FENOFIBRATE 160 MG PO TABS: ORAL_TABLET | ORAL | 2 refills | Status: AC

## 2024-05-03 ENCOUNTER — Other Ambulatory Visit: Payer: Self-pay | Admitting: Endocrinology

## 2024-05-03 DIAGNOSIS — E119 Type 2 diabetes mellitus without complications: Secondary | ICD-10-CM

## 2024-05-04 ENCOUNTER — Encounter: Payer: Self-pay | Admitting: Endocrinology

## 2024-05-04 ENCOUNTER — Other Ambulatory Visit: Payer: Self-pay

## 2024-05-04 ENCOUNTER — Ambulatory Visit

## 2024-05-04 DIAGNOSIS — R519 Headache, unspecified: Secondary | ICD-10-CM

## 2024-05-04 DIAGNOSIS — I4891 Unspecified atrial fibrillation: Secondary | ICD-10-CM

## 2024-05-04 DIAGNOSIS — I4892 Unspecified atrial flutter: Secondary | ICD-10-CM

## 2024-05-04 DIAGNOSIS — G4733 Obstructive sleep apnea (adult) (pediatric): Secondary | ICD-10-CM

## 2024-05-04 DIAGNOSIS — G4719 Other hypersomnia: Secondary | ICD-10-CM

## 2024-05-04 DIAGNOSIS — E663 Overweight: Secondary | ICD-10-CM

## 2024-05-04 DIAGNOSIS — R351 Nocturia: Secondary | ICD-10-CM

## 2024-05-17 NOTE — Progress Notes (Unsigned)
 Colin Wood

## 2024-05-22 ENCOUNTER — Ambulatory Visit: Payer: Self-pay | Admitting: Neurology

## 2024-05-22 ENCOUNTER — Ambulatory Visit: Payer: Self-pay | Admitting: Endocrinology

## 2024-05-22 DIAGNOSIS — G4733 Obstructive sleep apnea (adult) (pediatric): Secondary | ICD-10-CM

## 2024-05-22 LAB — COMPREHENSIVE METABOLIC PANEL WITH GFR
ALT: 15 IU/L (ref 0–44)
AST: 20 IU/L (ref 0–40)
Albumin: 4.9 g/dL (ref 3.9–4.9)
Alkaline Phosphatase: 56 IU/L (ref 47–123)
BUN/Creatinine Ratio: 17 (ref 10–24)
BUN: 17 mg/dL (ref 8–27)
Bilirubin Total: 0.5 mg/dL (ref 0.0–1.2)
CO2: 24 mmol/L (ref 20–29)
Calcium: 10.4 mg/dL — ABNORMAL HIGH (ref 8.6–10.2)
Chloride: 101 mmol/L (ref 96–106)
Creatinine, Ser: 0.98 mg/dL (ref 0.76–1.27)
Globulin, Total: 2 g/dL (ref 1.5–4.5)
Glucose: 120 mg/dL — ABNORMAL HIGH (ref 70–99)
Potassium: 5 mmol/L (ref 3.5–5.2)
Sodium: 141 mmol/L (ref 134–144)
Total Protein: 6.9 g/dL (ref 6.0–8.5)
eGFR: 86 mL/min/1.73

## 2024-05-22 LAB — T4, FREE: Free T4: 1.93 ng/dL — ABNORMAL HIGH (ref 0.82–1.77)

## 2024-05-22 LAB — HEMOGLOBIN A1C
Est. average glucose Bld gHb Est-mCnc: 128 mg/dL
Hgb A1c MFr Bld: 6.1 % — ABNORMAL HIGH (ref 4.8–5.6)

## 2024-05-22 LAB — LIPID PANEL
Chol/HDL Ratio: 3.3 ratio (ref 0.0–5.0)
Cholesterol, Total: 162 mg/dL (ref 100–199)
HDL: 49 mg/dL
LDL Chol Calc (NIH): 87 mg/dL (ref 0–99)
Triglycerides: 147 mg/dL (ref 0–149)
VLDL Cholesterol Cal: 26 mg/dL (ref 5–40)

## 2024-05-22 LAB — VITAMIN D 25 HYDROXY (VIT D DEFICIENCY, FRACTURES): Vit D, 25-Hydroxy: 42.8 ng/mL (ref 30.0–100.0)

## 2024-05-22 LAB — TSH: TSH: 1.22 u[IU]/mL (ref 0.450–4.500)

## 2024-05-22 NOTE — Telephone Encounter (Signed)
 Spoke to patient gave sleep study results Pt chose [facility] as DME . Gave [employer] # to patient . Made f/u visit with Lauraine 07/2024 sent orders to [facility] this afternoon and forward sleep study results to PCP . Pt expressed understanding and thanked me for calling

## 2024-05-22 NOTE — Telephone Encounter (Signed)
-----   Message from True Mar, MD sent at 05/22/2024 11:54 AM EST ----- Patient referred by PCP, seen by me on 04/24/2024, patient had HST on 05/07/2024.    Please call and notify the patient that the recent home sleep test showed obstructive sleep apnea. [name] is overall mild, but worth treating to see if he feels better after treatment. To that end I  recommend treatment for this in the form of autoPAP, which means, that we don't have to bring him in for a sleep study with CPAP, but will let him try an autoPAP machine at home, through a DME  company (of his choice, or as per insurance requirement). The DME representative will educate him on how to use the machine, how to put the mask on, etc. I have placed an order in the chart. Please  send referral, talk to patient, send report to referring MD. We will need a FU in sleep clinic in about 2.-3 months post-PAP set up (which is usually an insurance-mandated appointment to monitor  compliance), please arrange that with me or one of our NPs. Please also go over the need for compliance with treatment (including the insurance-imposed minimum compliance percentage). Thanks,   True Mar, MD, PhD [facility])

## 2024-05-22 NOTE — Procedures (Signed)
 "   [facility]  HOME SLEEP TEST (SANSA) REPORT (Mail-Out Device):   STUDY DATE: 05/07/2024  DOB: November 18, 1960  MRN: [id number]  ORDERING CLINICIAN: True Mar, MD, PhD   REFERRING CLINICIAN: Amon Aloysius BRAVO, MD   CLINICAL INFORMATION/HISTORY (obtained from visit note dated 04/24/2024): 63 year old male with an underlying medical history of hypertension, leg edema, anxiety, diabetes, hyperlipidemia, atrial fibrillation, history of atrial flutter, vitamin D  deficiency, reflux disease, hypothyroidism, status post PFO closure, status post cardioversion, status post cardiac ablation, and overweight state, who was previously diagnosed with obstructive sleep apnea and placed on PAP therapy.  He is no longer on PAP therapy for years, he had significant weight loss since his original sleep apnea diagnosis.  PATIENT'S LAST REPORTED EPWORTH SLEEPINESS SCORE (ESS): 14/24.  BMI (at the time of sleep clinic visit and/or test date): 28.8 kg/m  FINDINGS:   Study Protocol:    The SANSA single-point-of-skin-contact chest-worn sensor - an FDA cleared and DOT approved type 4 home sleep test device - measures eight physiological channels,  including blood oxygen saturation (measured via PPG [photoplethysmography]), EKG-derived heart rate, respiratory effort, chest movement (measured via accelerometer), snoring, body position, and actigraphy. The device is designed to be worn for up to 10 hours per study.   Sleep Summary:   Total Recording Time (hours, min): 8 hours, 16 min  Total Effective Sleep Time (hours, min):  7 hours, 12 min  Sleep Efficiency (%):    87%   Respiratory Indices:   Calculated sAHI (per hour):  7.6/hour        Calculated sAHIc (central AHI per hour):  1/hour  Oxygen Saturation Statistics:    Oxygen Saturation (%) Mean: 94%   Minimum oxygen saturation (%):                 69.6%   O2 Saturation Range (%): 69.6-100%   Time below or at 88% saturation: 2  min   Pulse Rate Statistics:   Pulse Mean (bpm):    72/min    Pulse Range (66-89/min)   Snoring: Intermittent, mild  IMPRESSION/DIAGNOSES:   [name] (obstructive sleep apnea), mild   RECOMMENDATIONS:   This home sleep test demonstrates overall mild obstructive sleep apnea with a total AHI of 7.6/hour and O2 nadir of 79% (briefly) without any significant time below or at 88% saturation of less than 2 minutes for the night, O2 nadir of 69.6% appears to be an error. Snoring was detected, intermittently, in the mild range. Given the patient's medical history and sleep related complaints, therapy with a positive airway pressure device such as AutoPap can be considered.  A full night, in-lab PAP titration study may aid in improving proper treatment settings and with mask fit, if needed, down the road.  Alternative treatments may include weight loss (where appropriate) along with avoidance of the supine sleep position (if possible), or an oral appliance in appropriate candidates.   Please note that untreated obstructive sleep apnea may carry additional perioperative morbidity. Patients with significant obstructive sleep apnea should receive perioperative PAP therapy and the surgeons and particularly the anesthesiologist should be informed of the diagnosis and the severity of the sleep disordered breathing. The patient should be cautioned not to drive, work at heights, or operate dangerous or heavy equipment when tired or sleepy. Review and reiteration of good sleep hygiene measures should be pursued with any patient. Other causes of the patient's symptoms, including circadian rhythm disturbances, an underlying mood disorder, medication effect and/or an underlying medical  problem cannot be ruled out based on this test. Clinical correlation is recommended.  The patient and his referring provider will be notified of the test results. The patient will be seen in follow up in sleep clinic at [facility], as  necessary.  I certify that I have reviewed the raw data recording prior to the issuance of this report in accordance with the standards of the American Academy of Sleep Medicine (AASM).    INTERPRETING PHYSICIAN:   True Mar, MD, PhD Medical Director, [facility]) Diplomat, ABPN (Neurology and Sleep)   [facility] [address], [address] [place], [place] [address] [phone]          "

## 2024-05-28 ENCOUNTER — Ambulatory Visit: Admitting: Endocrinology

## 2024-05-28 ENCOUNTER — Encounter: Payer: Self-pay | Admitting: Endocrinology

## 2024-05-28 ENCOUNTER — Encounter: Payer: Self-pay | Admitting: Student

## 2024-05-28 ENCOUNTER — Ambulatory Visit: Attending: Student | Admitting: Student

## 2024-05-28 VITALS — BP 124/58 | HR 90 | Ht 73.0 in | Wt 216.0 lb

## 2024-05-28 VITALS — BP 108/50 | HR 76 | Resp 16 | Ht 73.0 in | Wt 215.2 lb

## 2024-05-28 DIAGNOSIS — I4891 Unspecified atrial fibrillation: Secondary | ICD-10-CM

## 2024-05-28 DIAGNOSIS — Z7985 Long-term (current) use of injectable non-insulin antidiabetic drugs: Secondary | ICD-10-CM | POA: Diagnosis not present

## 2024-05-28 DIAGNOSIS — Z7984 Long term (current) use of oral hypoglycemic drugs: Secondary | ICD-10-CM | POA: Diagnosis not present

## 2024-05-28 DIAGNOSIS — E063 Autoimmune thyroiditis: Secondary | ICD-10-CM | POA: Diagnosis not present

## 2024-05-28 DIAGNOSIS — E559 Vitamin D deficiency, unspecified: Secondary | ICD-10-CM | POA: Diagnosis not present

## 2024-05-28 DIAGNOSIS — E785 Hyperlipidemia, unspecified: Secondary | ICD-10-CM | POA: Diagnosis not present

## 2024-05-28 DIAGNOSIS — I4892 Unspecified atrial flutter: Secondary | ICD-10-CM | POA: Diagnosis not present

## 2024-05-28 DIAGNOSIS — D6869 Other thrombophilia: Secondary | ICD-10-CM | POA: Diagnosis not present

## 2024-05-28 DIAGNOSIS — E119 Type 2 diabetes mellitus without complications: Secondary | ICD-10-CM | POA: Diagnosis not present

## 2024-05-28 NOTE — Patient Instructions (Signed)
 Medication Instructions:  No medication changes today. *If you need a refill on your cardiac medications before your next appointment, please call your pharmacy*  Lab Work: No labwork ordered today. If you have labs (blood work) drawn today and your tests are completely normal, you will receive your results only by: MyChart Message (if you have MyChart) OR A paper copy in the mail If you have any lab test that is abnormal or we need to change your treatment, we will call you to review the results.  Testing/Procedures: No testing ordered today  Follow-Up: At [facility], you and your health needs are our priority.  As part of our continuing mission to provide you with exceptional heart care, our providers are all part of one team.  This team includes your primary Cardiologist (physician) and Advanced Practice Providers or APPs (Physician Assistants and Nurse Practitioners) who all work together to provide you with the care you need, when you need it.  Your next appointment:   12 month(s)  Provider:   You may see Efraim Grange, MD or one of the following Advanced Practice Providers on your designated Care Team:   Mertha Abrahams, [place] [address] [place], PA-C Suzann Riddle, NP Creighton Doffing, NP    We recommend signing up for the patient portal called "MyChart".  Sign up information is provided on this After Visit Summary.  MyChart is used to connect with patients for Virtual Visits (Telemedicine).  Patients are able to view lab/test results, encounter notes, upcoming appointments, etc.  Non-urgent messages can be sent to your provider as well.   To learn more about what you can do with MyChart, go to [email or web address].

## 2024-05-28 NOTE — Progress Notes (Signed)
" °  Electrophysiology Office Note:   Date:  05/28/2024  ID:  AVERILL PONS, DOB [birth year], MRN [id number]  Primary Cardiologist: None Electrophysiologist: Eulas FORBES Furbish, MD   Electrophysiologist:  Eulas FORBES Furbish, MD      History of Present Illness:   Colin Wood is a 63 y.o. male with h/o hypothyroidism, DM2, persistent AF, and typical appearing AFL s/p ablation 2014 and MAZE 2019 seen today for routine electrophysiology followup.   Since last being seen in our clinic the patient reports doing well overall. Currently, he denies chest pain, palpitations, dyspnea, PND, orthopnea, nausea, vomiting, dizziness, syncope, edema, weight gain, or early satiety.   Review of systems complete and found to be negative unless listed in HPI.   EP Information / Studies Reviewed:    EKG is ordered today. Personal review as below.  EKG Interpretation Date/Time:  Monday May 28 2024 10:30:29 EST Ventricular Rate:  90 PR Interval:    QRS Duration:  130 QT Interval:  384 QTC Calculation: 469 R Axis:   -47  Text Interpretation: NSR with wide QRS Right bundle branch block Left anterior fascicular block Bifascicular block Confirmed by Lesia Sharper [phone]) on 05/28/2024 10:33:24 AM    Arrhythmia/Device History S/p Ablation 2014 S/p MAZE + LAA clip 2019    Physical Exam:   VS:  BP (!) 124/58   Pulse 90   Ht 6' 1 (1.854 m)   Wt 216 lb (98 kg)   SpO2 96%   BMI 28.50 kg/m    Wt Readings from Last 3 Encounters:  05/28/24 216 lb (98 kg)  05/28/24 215 lb 3.2 oz (97.6 kg)  04/24/24 218 lb (98.9 kg)     GEN: No acute distress NECK: No JVD; No carotid bruits CARDIAC: Regular rate and rhythm, no murmurs, rubs, gallops RESPIRATORY:  Clear to auscultation without rales, wheezing or rhonchi  ABDOMEN: Soft, non-tender, non-distended EXTREMITIES:  No edema; No deformity   ASSESSMENT AND PLAN:    Persistent Atrial fibrillation Typical Atrial flutter Secondary  hypercoagulable state EKG today shows NSR Not on OAC s/p LAA clipping and CHA2DS2VASc of only 1.  Normal cMRI 02/2020 No symptoms or concerns that warrant updating Echo at this time.   Follow up with Dr. Furbish in 12 months  Signed, Sharper Prentice Lesia, PA-C  "

## 2024-05-28 NOTE — Progress Notes (Signed)
 "  Outpatient Endocrinology Note Colin Maynor, MD   Patient's Name: Colin Wood    DOB: [birth year]    MRN: [id number]                                                    REASON OF VISIT: Follow up for type 2 diabetes mellitus  PCP: Amon Aloysius BRAVO, MD  HISTORY OF PRESENT ILLNESS:   Colin Wood is a 63 y.o. old male with past medical history listed below, is here for follow up for type 2 diabetes mellitus.   Pertinent Diabetes History: Patient was previously seen by Dr Von and was last time seen in August 2024.  Patient was diagnosed with type 2 diabetes mellitus around 2011.  Chronic Diabetes Complications : Retinopathy: no. Last ophthalmology exam was done on annually, following with ophthalmology regularly.  Nephropathy: no, on ACE/ARB / lisinopril  Peripheral neuropathy: no Coronary artery disease: no Stroke: no  Relevant comorbidities and cardiovascular risk factors: Obesity: yes Body mass index is 28.39 kg/m.  Hypertension: Yes  Hyperlipidemia : Yes, on statin   Current / Home Diabetic regimen includes:  Oral hypoglycemic drugs the patient is taking are: metformin  1 g twice a day, Ozempic  2 mg weekly, Jardiance  25 mg daily       Prior diabetic medications: Amaryl , Tradjenta  in the past.  Trulicity  was changed to Ozempic  because of lack of availability of Trulicity  in the past.  Glycemic data:    CONTINUOUS GLUCOSE MONITORING SYSTEM (CGMS) INTERPRETATION: At today's visit, we reviewed CGM downloads. The full report is scanned in the media. Reviewing the CGM trends, blood glucose are as follows:  Dexcom G7 CGM-  Sensor Download (Sensor download was reviewed and summarized below.) Dates: December 16 to May 28, 2024, 14 days  Glucose Management Indicator: 7.2%  Sensor usage :90 %      Interpretation: Mostly acceptable blood sugar with mild hyperglycemic blood sugar 180-200 range especially with breakfast and some time with supper.  No  hypoglycemia.  Hypoglycemia: Patient has no hypoglycemic episodes. Patient has hypoglycemia awareness.  Factors modifying glucose control: 1.  Diabetic diet assessment: 3 meals a day.  2.  Staying active or exercising:   3.  Medication compliance: compliant all of the time.  # Primary hypothyroidism -Diagnosed in 1994.  He has been taking levothyroxine  137 mcg daily.  # Vitamin D  deficiency currently taking vitamin D3 2000 international unit daily.  # HYPERCALCEMIA:  For quite some time calcium  is slightly high or upper normal, also note that his albumin  level is slightly high.  Corrected serum calcium  normal. PTH level has been normal, last 26 in 2021. No history of kidney stones or known osteopenia  Interval history  CGM data as reviewed above.  Recent laboratory results reviewed, acceptable lipids level, electrolytes, renal function, liver function.  Normal vitamin D .  Hemoglobin A1c 6.1%.  Thyroid  function test normal TSH and mildly elevated free T4 likely related to taking levothyroxine  that morning prior to lab.  No hyperthyroid symptoms.  He has been taking levothyroxine  137 mcg daily in the early morning fasting.  No numbness and tingling of the feet.  No vision problem.  No other complaints today.  Recent lab with serum calcium  10.4, albumin  4.9 and corrected serum calcium  of 9.7 normal.  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  REVIEW OF SYSTEMS As per history of present illness.   PAST MEDICAL HISTORY: Past Medical History:  Diagnosis Date   Anxiety    on meds   Atrial flutter (HCC)    typical appearing; s/p ablation 12-2012   Constipation    Depression    hx of   Diabetes mellitus    TYPE 2-on meds   Fatty liver    GERD (gastroesophageal reflux disease)    Gout    Hyperlipidemia    Hypothyroidism    on meds   Leg edema    Obesity    Persistent atrial fibrillation (HCC)    PVI 12/2012   Prediabetes    Rhinitis    vasomotor   S/P  Minimally invasive maze operation for atrial fibrillation 03/12/2016   Complete bilateral atrial lesion set using cryothermy and bipolar radiofrequency ablation with clipping of LA appendage via right mini thoracotomy approach   S/P patent foramen ovale closure 03/12/2016   Sleep apnea    mild, now treated with CPAP since ~9-12    PAST SURGICAL HISTORY: Past Surgical History:  Procedure Laterality Date   ABLATION OF DYSRHYTHMIC FOCUS  01/05/2013   PVI and flutter ablation by Dr Kelsie   ATRIAL FIBRILLATION ABLATION N/A 01/05/2013   Procedure: ATRIAL FIBRILLATION ABLATION;  Surgeon: Lynwood Kelsie, MD;  Location: [facility] CATH LAB;  Service: Cardiovascular;  Laterality: N/A;   CARDIAC CATHETERIZATION N/A 01/12/2016   Procedure: Right/Left Heart Cath and Coronary Angiography;  Surgeon: Lonni Hanson, MD;  Location: [facility] INVASIVE CV LAB;  Service: Cardiovascular;  Laterality: N/A;   CARDIOVERSION  03/01/2011   CLIPPING OF ATRIAL APPENDAGE Left 03/12/2016   Procedure: CLIPPING OF ATRIAL APPENDAGE;  Surgeon: Sudie VEAR Laine, MD;  Location: [facility] OR;  Service: Open Heart Surgery;  Laterality: Left;   COLONOSCOPY  2020   HD-suprep(good)   MINIMALLY INVASIVE MAZE PROCEDURE N/A 03/12/2016   Procedure: MINIMALLY INVASIVE MAZE PROCEDURE;  Surgeon: Sudie VEAR Laine, MD;  Location: [facility] OR;  Service: Open Heart Surgery;  Laterality: N/A;   NASAL SINUS SURGERY     TEE WITHOUT CARDIOVERSION N/A 01/04/2013   Procedure: TRANSESOPHAGEAL ECHOCARDIOGRAM (TEE);  Surgeon: Vina LULLA Gull, MD;  Location: [facility] ENDOSCOPY;  Service: Cardiovascular;  Laterality: N/A;   TEE WITHOUT CARDIOVERSION N/A 03/12/2016   Procedure: TRANSESOPHAGEAL ECHOCARDIOGRAM (TEE);  Surgeon: Sudie VEAR Laine, MD;  Location: [facility] OR;  Service: Open Heart Surgery;  Laterality: N/A;   TONSILLECTOMY     VASECTOMY     WISDOM TOOTH EXTRACTION      ALLERGIES: No Known Allergies  FAMILY HISTORY:  Family History  Problem Relation Age of Onset   Asthma  Mother    Breast cancer Mother    Dementia Mother    Colon polyps Father 63   Diabetes Father    Hyperlipidemia Father    Obesity Father    Atrial fibrillation Father    Hypertension Brother    Melanoma Brother    Throat cancer Maternal Uncle    Colon polyps Paternal Grandfather 18   Colon cancer Paternal Grandfather 35   Allergies Daughter    Prostate cancer Neg Hx    Esophageal cancer Neg Hx    Stomach cancer Neg Hx    Rectal cancer Neg Hx     SOCIAL HISTORY: Social History   Socioeconomic History   Marital status: Married    Spouse name: Avelina   Number of children: 2   Years of education: Not  on file   Highest education level: Not on file  Occupational History   Occupation: [employer]  Tobacco Use   Smoking status: Never   Smokeless tobacco: Never  Vaping Use   Vaping status: Never Used  Substance and Sexual Activity   Alcohol use: No   Drug use: No   Sexual activity: Not on file  Other Topics Concern   Not on file  Social History Narrative   Pt lives in [place] with spouse and 1 child   2 children  1988, 2000 .     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   Social Drivers of Health   Tobacco Use: Low Risk (05/28/2024)   Patient History    Smoking Tobacco Use: Never    Smokeless Tobacco Use: Never    Passive Exposure: Not on file  Financial Resource Strain: Not on file  Food Insecurity: Not on file  Transportation Needs: Not on file  Physical Activity: Not on file  Stress: Not on file  Social Connections: Not on file  Depression (PHQ2-9): Low Risk (09/30/2023)   Depression (PHQ2-9)    PHQ-2 Score: 0  Alcohol Screen: Not on file  Housing: Not on file  Utilities: Not on file  Health Literacy: Not on file    MEDICATIONS:  Current Outpatient Medications  Medication Sig Dispense Refill   allopurinol  (ZYLOPRIM ) 100 MG tablet Take 2 tablets (200 mg total) by mouth daily. (Patient taking differently: Take 100 mg by mouth daily.) 180 tablet 2   Ascorbic Acid  (VITAMIN C PO) Take 2 tablets by mouth 2 (two) times daily.     atorvastatin  (LIPITOR) 20 MG tablet TAKE 1 TABLET BY MOUTH DAILY 90 tablet 2   azelastine  (ASTELIN ) 0.1 % nasal spray Place 2 sprays into both nostrils at bedtime as needed for rhinitis or allergies. 30 mL 3   Cholecalciferol 50 MCG (2000 UT) CAPS Take 3 capsules by mouth daily at 6 (six) AM.     colchicine  0.6 MG tablet Take 1 tablet (0.6 mg total) by mouth 2 (two) times daily as needed. 60 tablet 1   Continuous Glucose Sensor (DEXCOM G7 SENSOR) MISC Change every 10 days 9 each 3   famotidine  (PEPCID ) 20 MG tablet Take 1 tablet (20 mg total) by mouth at bedtime. 90 tablet 2   fenofibrate  160 MG tablet TAKE 1 TABLET(160 MG) BY MOUTH DAILY 90 tablet 2   fluticasone  (FLONASE ) 50 MCG/ACT nasal spray Place 2 sprays into both nostrils daily. 16 mL 0   glucose blood (CONTOUR NEXT TEST) test strip CHECK BLOOD SUGAR  DAILY AS DIRECTED 50 strip 3   icosapent  Ethyl (VASCEPA ) 1 g capsule TAKE 2 CAPSULES(2 GRAMS) BY MOUTH TWICE DAILY 360 capsule 3   JARDIANCE  25 MG TABS tablet TAKE 1 TABLET(25 MG) BY MOUTH DAILY 90 tablet 3   levothyroxine  (SYNTHROID ) 137 MCG tablet Take 1 tablet (137 mcg total) by mouth daily before breakfast. 90 tablet 3   lisinopril  (ZESTRIL ) 5 MG tablet Take 1 tablet (5 mg total) by mouth daily. 90 tablet 2   Magnesium  500 MG TABS Take 1 tablet by mouth daily in the afternoon.     metFORMIN  (GLUCOPHAGE ) 1000 MG tablet TAKE 1 TABLET(1000 MG) BY MOUTH TWICE DAILY WITH A MEAL 180 tablet 3   Microlet Lancets MISC USE TO CHECK BLOOD SUGAR THREE TIMES DAILY 100 each 3   pantoprazole  (PROTONIX ) 40 MG tablet Take 1 tablet (40 mg total) by mouth daily before breakfast. 90  tablet 2   Semaglutide , 2 MG/DOSE, (OZEMPIC , 2 MG/DOSE,) 8 MG/3ML SOPN INJECT 2 MG UNDER THE SKIN ONCE WEEKLY 9 mL 3   Sennosides-Docusate Sodium  (SENNA-DOCUSATE SODIUM  PO) Take 1 tablet by mouth as needed.     No current facility-administered medications for this  visit.    PHYSICAL EXAM: Vitals:   05/28/24 0810  BP: (!) 108/50  Pulse: 76  Resp: 16  SpO2: 97%  Weight: 215 lb 3.2 oz (97.6 kg)  Height: 6' 1 (1.854 m)     Body mass index is 28.39 kg/m.  Wt Readings from Last 3 Encounters:  05/28/24 215 lb 3.2 oz (97.6 kg)  04/24/24 218 lb (98.9 kg)  01/19/24 220 lb 6.4 oz (100 kg)    General: Well developed, well nourished male in no apparent distress.  HEENT: AT/[place], no external lesions.  Eyes: Conjunctiva clear and no icterus. Neck: Neck supple  Lungs: Respirations not labored Neurologic: Alert, oriented, normal speech Extremities / Skin: Dry.  Psychiatric: Does not appear depressed or anxious  Diabetic Foot Exam - Simple   No data filed     LABS Reviewed Lab Results  Component Value Date   HGBA1C 6.1 (H) 05/21/2024   HGBA1C 6.2 (H) 09/13/2023   HGBA1C 6.2 (A) 05/18/2023   No results found for: FRUCTOSAMINE Lab Results  Component Value Date   CHOL 162 05/21/2024   HDL 49 05/21/2024   LDLCALC 87 05/21/2024   TRIG 147 05/21/2024   CHOLHDL 3.3 05/21/2024   Lab Results  Component Value Date   MICRALBCREAT 4 09/13/2023   MICRALBCREAT <4 09/24/2022   Lab Results  Component Value Date   CREATININE 0.98 05/21/2024   No results found for: GFR  ASSESSMENT / PLAN  1. Controlled type 2 diabetes mellitus without complication, without long-term current use of insulin  (HCC)   2. Hyperlipidemia, unspecified hyperlipidemia type   3. Vitamin D  deficiency   4. Acquired autoimmune hypothyroidism     Diabetes Mellitus type 2, complicated by no known complications. - Diabetic status / severity: Controlled.  Lab Results  Component Value Date   HGBA1C 6.1 (H) 05/21/2024    - Hemoglobin A1c goal : <6.5%  Reasonable control of diabetes.  He lost about 5 pounds of weight since last visit.  He is concerned about muscle mass loss, discussed about weight lifting and resistance training for muscles building.  -  Medications: See below.  No change.  I) continue Jardiance  25 mg daily. II) continue Ozempic  2 mg weekly. III) continue metformin  1000 mg 2 times a day.  - Home glucose testing: CGM and check as needed, Dexcom G7.  - Discussed/ Gave Hypoglycemia treatment plan.  # Consult : not required at this time.   # Annual urine for microalbuminuria/ creatinine ratio, no microalbuminuria currently. Last  Lab Results  Component Value Date   MICRALBCREAT 4 09/13/2023    # Foot check nightly.  # Annual dilated diabetic eye exams.   - Diet: Make healthy diabetic food choices - Life style / activity / exercise: Discussed.  2. Blood pressure  -  BP Readings from Last 1 Encounters:  05/28/24 (!) 108/50    - Control is in target.  - No change in current plans.  3. Lipid status / Hyperlipidemia - Last  Lab Results  Component Value Date   LDLCALC 87 05/21/2024   - Continue atorvastatin  20 mg daily.  Continue fenofibrate  and Vascepa .    # Hypothyroidism : Continue levothyroxine  137 mcg daily.  Will check thyroid  function test prior to next follow-up visit.  # Vitamin D  deficiency, continue vitamin D3 2000 international units daily.  Okay to take increased dose of vitamin D3 during winter months.  Recent vitamin D  level normal.  Diagnoses and all orders for this visit:  Controlled type 2 diabetes mellitus without complication, without long-term current use of insulin  (HCC) -     Microalbumin / creatinine urine ratio -     Basic metabolic panel with GFR -     Hemoglobin A1c  Hyperlipidemia, unspecified hyperlipidemia type  Vitamin D  deficiency  Acquired autoimmune hypothyroidism -     T4, free -     TSH     He prefers to do labs at [facility] when needed.  Labs prior to follow-up visit as ordered.    DISPOSITION Follow up in clinic in 4  months suggested.   All questions answered and patient verbalized understanding of the plan.  Navi Erber, MD [facility]  Endocrinology [facility] [address] [place], [address] [place], [place] [address] Phone # [phone]  At least part of this note was generated using voice recognition software. Inadvertent word errors may have occurred, which were not recognized during the proofreading process.  Addendum: Outside lab results completed on January 13, 2024 received and reviewed as follows.  Hemoglobin A1c 6.2%.  Lipids level LDL 115, total cholesterol 194, triglyceride 191, HDL 46, VLDL 33. "

## 2024-06-25 ENCOUNTER — Other Ambulatory Visit: Payer: Self-pay | Admitting: Endocrinology

## 2024-06-25 DIAGNOSIS — E063 Autoimmune thyroiditis: Secondary | ICD-10-CM

## 2024-07-03 ENCOUNTER — Encounter: Admitting: Neurology

## 2024-07-03 NOTE — Progress Notes (Unsigned)
 SABRA

## 2024-07-04 ENCOUNTER — Ambulatory Visit: Admitting: Neurology

## 2024-07-04 ENCOUNTER — Encounter: Payer: Self-pay | Admitting: Neurology

## 2024-07-04 VITALS — BP 138/80 | HR 76 | Ht 73.0 in | Wt 219.0 lb

## 2024-07-04 DIAGNOSIS — G4733 Obstructive sleep apnea (adult) (pediatric): Secondary | ICD-10-CM | POA: Diagnosis not present

## 2024-07-04 NOTE — Patient Instructions (Signed)
 Great to see you today! Continue CPAP nightly minimum 4 hours I will turn the RAMP off  Reach out if any issues! I will reach out in 1 month with updated CPAP data Otherwise plan to follow-up in 1 year virtually.  Thanks!!

## 2024-07-04 NOTE — Progress Notes (Signed)
 "  Patient: Colin Wood Date of Birth: 11-06-1960  Reason for Visit: Follow up History from: Patient Primary Neurologist: Buck   ASSESSMENT AND PLAN 64 y.o. year old male   1.  Mild [name] on CPAP -HST 05/07/24 overall mild obstructive sleep apnea with a total AHI of 7.6/hour and O2 nadir of 79% (briefly) without any significant time below or at 88% saturation of less than 2 minutes for the night, O2 nadir of 69.6% appears to be an error.  -CPAP setup 06/18/24  - Turn off RAMP setting, continue pressure 6-12 cmH2O - I will pull a download in 1 month, he is a little early for his 30 to 90-day initial compliance. He does already have excellent compliance, AHI well treated at 0.9 and experiencing good benefit - Continue nightly usage minimum 4 hours - Follow-up in 1 year virtually  HISTORY OF PRESENT ILLNESS: Today 07/04/24 Update 07/04/24 SS: HST 05/07/24 overall mild obstructive sleep apnea with a total AHI of 7.6/hour and O2 nadir of 79% (briefly) without any significant time below or at 88% saturation of less than 2 minutes for the night, O2 nadir of 69.6% appears to be an error. CPAP setup 06/18/24. Likes CPAP better than he thought he would. Using nasal pillow mask. He wanted to pursue sleep study due to snoring and being restless at night. He is no longer snoring according to his wife. He feels more rested, has changed his habits to go to bed earlier, avoid caffeine in the evening. He does not like the RAMP setting, feeling like not getting enough air when he first applies CPAP. Current AHI 0.9, leak 11.5.  HISTORY  04/24/24 Dr. Buck: I saw your patient, Colin Wood, upon your kind request in my sleep clinic today for initial consultation of his sleep disorder, in particular, concern for underlying obstructive sleep apnea.  The patient is unaccompanied today.  As you know, Colin Wood is a 64 year old male with an underlying medical history of hypertension, leg edema, anxiety,  diabetes, hyperlipidemia, atrial fibrillation, history of atrial flutter, vitamin D  deficiency, reflux disease, hypothyroidism, status post PFO closure, status post cardioversion, status post cardiac ablation, and overweight state, who was previously diagnosed with obstructive sleep apnea and placed on PAP therapy.  I was able to review his sleep study report from 01/10/2011.  He had a in-lab sleep study at [facility] and study was interpreted by Dr. Bedford.  His total AHI was 6.8/h at the time, O2 nadir was 89%.  He stopped using a CPAP machine several years ago due to significant weight loss achieved.  At the time of his prior sleep study his weight was 270 pounds, currently 218.  He reports snoring and symptoms of gasping for air at night.  His Epworth sleepiness score is 14 out of 24, fatigue severity score is 49 out of 63.  He had trouble tolerating CPAP in the past.  He would be willing to get reevaluated.  He has snoring, he is a restless sleeper and tosses and turns a lot, is a side sleeper.  He starts off in a recliner for a few hours and then goes to bed.  He takes Benadryl as needed for allergy symptoms and nasal congestion.  He has rare morning headaches and nocturia about twice per average night.  He is not aware of any family history of sleep apnea.  His wife has a CPAP machine.  A couple of times he has woken up with a  sense of gasping for air in the past couple of months.  He lives with his wife and his younger daughter, age 68.  His older daughter is 4 and is on her own.  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  He does take a nap almost daily at lunch.  Bedtime is generally between 9 and 11 and rise time between 6 and 6:30 AM.  He is a non-smoker and does not drink any alcohol and drinks quite a bit of caffeine in the form of diet soda, 6 to 12 cans/day, as late as bedtime.  He has a history of motor tics since he was a teenager.   REVIEW OF SYSTEMS: Out of a complete 14 system  review of symptoms, the patient complains only of the following symptoms, and all other reviewed systems are negative.  See HPI  ALLERGIES: Allergies[1]  HOME MEDICATIONS: Outpatient Medications Prior to Visit  Medication Sig Dispense Refill   allopurinol  (ZYLOPRIM ) 100 MG tablet Take 2 tablets (200 mg total) by mouth daily. (Patient taking differently: Take 100 mg by mouth daily.) 180 tablet 2   Ascorbic Acid (VITAMIN C PO) Take 2 tablets by mouth 2 (two) times daily.     atorvastatin  (LIPITOR) 20 MG tablet TAKE 1 TABLET BY MOUTH DAILY 90 tablet 2   azelastine  (ASTELIN ) 0.1 % nasal spray Place 2 sprays into both nostrils at bedtime as needed for rhinitis or allergies. 30 mL 3   Cholecalciferol 50 MCG (2000 UT) CAPS Take 3 capsules by mouth daily at 6 (six) AM.     colchicine  0.6 MG tablet Take 1 tablet (0.6 mg total) by mouth 2 (two) times daily as needed. 60 tablet 1   Continuous Glucose Sensor (DEXCOM G7 SENSOR) MISC Change every 10 days 9 each 3   famotidine  (PEPCID ) 20 MG tablet Take 1 tablet (20 mg total) by mouth at bedtime. 90 tablet 2   fenofibrate  160 MG tablet TAKE 1 TABLET(160 MG) BY MOUTH DAILY 90 tablet 2   fluticasone  (FLONASE ) 50 MCG/ACT nasal spray Place 2 sprays into both nostrils daily. 16 mL 0   glucose blood (CONTOUR NEXT TEST) test strip CHECK BLOOD SUGAR  DAILY AS DIRECTED 50 strip 3   icosapent  Ethyl (VASCEPA ) 1 g capsule TAKE 2 CAPSULES(2 GRAMS) BY MOUTH TWICE DAILY 360 capsule 3   JARDIANCE  25 MG TABS tablet TAKE 1 TABLET(25 MG) BY MOUTH DAILY 90 tablet 3   levothyroxine  (SYNTHROID ) 137 MCG tablet TAKE 1 TABLET(137 MCG) BY MOUTH DAILY BEFORE BREAKFAST 90 tablet 3   lisinopril  (ZESTRIL ) 5 MG tablet Take 1 tablet (5 mg total) by mouth daily. 90 tablet 2   Magnesium  500 MG TABS Take 1 tablet by mouth daily in the afternoon.     metFORMIN  (GLUCOPHAGE ) 1000 MG tablet TAKE 1 TABLET(1000 MG) BY MOUTH TWICE DAILY WITH A MEAL 180 tablet 3   Microlet Lancets MISC USE TO  CHECK BLOOD SUGAR THREE TIMES DAILY 100 each 3   pantoprazole  (PROTONIX ) 40 MG tablet Take 1 tablet (40 mg total) by mouth daily before breakfast. 90 tablet 2   Semaglutide , 2 MG/DOSE, (OZEMPIC , 2 MG/DOSE,) 8 MG/3ML SOPN INJECT 2 MG UNDER THE SKIN ONCE WEEKLY 9 mL 3   Sennosides-Docusate Sodium  (SENNA-DOCUSATE SODIUM  PO) Take 1 tablet by mouth as needed.     No facility-administered medications prior to visit.    PAST MEDICAL HISTORY: Past Medical History:  Diagnosis Date   Anxiety    on meds   Atrial  flutter (HCC)    typical appearing; s/p ablation 12-2012   Constipation    Depression    hx of   Diabetes mellitus    TYPE 2-on meds   Fatty liver    GERD (gastroesophageal reflux disease)    Gout    Hyperlipidemia    Hypothyroidism    on meds   Leg edema    Obesity    Persistent atrial fibrillation (HCC)    PVI 12/2012   Prediabetes    Rhinitis    vasomotor   S/P Minimally invasive maze operation for atrial fibrillation 03/12/2016   Complete bilateral atrial lesion set using cryothermy and bipolar radiofrequency ablation with clipping of LA appendage via right mini thoracotomy approach   S/P patent foramen ovale closure 03/12/2016   Sleep apnea    mild, now treated with CPAP since ~9-12    PAST SURGICAL HISTORY: Past Surgical History:  Procedure Laterality Date   ABLATION OF DYSRHYTHMIC FOCUS  01/05/2013   PVI and flutter ablation by Dr Kelsie   ATRIAL FIBRILLATION ABLATION N/A 01/05/2013   Procedure: ATRIAL FIBRILLATION ABLATION;  Surgeon: Lynwood Kelsie, MD;  Location: [facility] CATH LAB;  Service: Cardiovascular;  Laterality: N/A;   CARDIAC CATHETERIZATION N/A 01/12/2016   Procedure: Right/Left Heart Cath and Coronary Angiography;  Surgeon: Lonni Hanson, MD;  Location: [facility] INVASIVE CV LAB;  Service: Cardiovascular;  Laterality: N/A;   CARDIOVERSION  03/01/2011   CLIPPING OF ATRIAL APPENDAGE Left 03/12/2016   Procedure: CLIPPING OF ATRIAL APPENDAGE;  Surgeon: Sudie VEAR Laine, MD;  Location: [facility] OR;  Service: Open Heart Surgery;  Laterality: Left;   COLONOSCOPY  2020   HD-suprep(good)   MINIMALLY INVASIVE MAZE PROCEDURE N/A 03/12/2016   Procedure: MINIMALLY INVASIVE MAZE PROCEDURE;  Surgeon: Sudie VEAR Laine, MD;  Location: [facility] OR;  Service: Open Heart Surgery;  Laterality: N/A;   NASAL SINUS SURGERY     TEE WITHOUT CARDIOVERSION N/A 01/04/2013   Procedure: TRANSESOPHAGEAL ECHOCARDIOGRAM (TEE);  Surgeon: Vina LULLA Gull, MD;  Location: [facility] ENDOSCOPY;  Service: Cardiovascular;  Laterality: N/A;   TEE WITHOUT CARDIOVERSION N/A 03/12/2016   Procedure: TRANSESOPHAGEAL ECHOCARDIOGRAM (TEE);  Surgeon: Sudie VEAR Laine, MD;  Location: [facility] OR;  Service: Open Heart Surgery;  Laterality: N/A;   TONSILLECTOMY     VASECTOMY     WISDOM TOOTH EXTRACTION      FAMILY HISTORY: Family History  Problem Relation Age of Onset   Asthma Mother    Breast cancer Mother    Dementia Mother    Colon polyps Father 77   Diabetes Father    Hyperlipidemia Father    Obesity Father    Atrial fibrillation Father    Hypertension Brother    Melanoma Brother    Throat cancer Maternal Uncle    Colon polyps Paternal Grandfather 70   Colon cancer Paternal Grandfather 65   Allergies Daughter    Prostate cancer Neg Hx    Esophageal cancer Neg Hx    Stomach cancer Neg Hx    Rectal cancer Neg Hx     SOCIAL HISTORY: Social History   Socioeconomic History   Marital status: Married    Spouse name: Avelina   Number of children: 2   Years of education: Not on file   Highest education level: Not on file  Occupational History   Occupation: [employer]  Tobacco Use   Smoking status: Never   Smokeless tobacco: Never  Vaping Use   Vaping status: Never Used  Substance and  Sexual Activity   Alcohol use: No   Drug use: No   Sexual activity: Not on file  Other Topics Concern   Not on file  Social History Narrative   Pt lives in [place] with spouse and 1 child   2 children  1988, 2000  .     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   Social Drivers of Health   Tobacco Use: Low Risk (07/04/2024)   Patient History    Smoking Tobacco Use: Never    Smokeless Tobacco Use: Never    Passive Exposure: Not on file  Financial Resource Strain: Not on file  Food Insecurity: Not on file  Transportation Needs: Not on file  Physical Activity: Not on file  Stress: Not on file  Social Connections: Not on file  Intimate Partner Violence: Not on file  Depression (PHQ2-9): Low Risk (09/30/2023)   Depression (PHQ2-9)    PHQ-2 Score: 0  Alcohol Screen: Not on file  Housing: Not on file  Utilities: Not on file  Health Literacy: Not on file    PHYSICAL EXAM  Vitals:   07/04/24 0801  BP: 138/80  Pulse: 76  SpO2: 95%  Weight: 219 lb (99.3 kg)  Height: 6' 1 (1.854 m)   Body mass index is 28.89 kg/m.  Generalized: Well developed, in no acute distress  Neurological examination  Mentation: Alert oriented to time, place, history taking. Follows all commands speech and language fluent Gait and station: Gait is normal.   DIAGNOSTIC DATA (LABS, IMAGING, TESTING) - I reviewed patient records, labs, notes, testing and imaging myself where available.  Lab Results  Component Value Date   WBC 6.2 09/30/2023   HGB 15.1 09/30/2023   HCT 45.1 09/30/2023   MCV 88 09/30/2023   PLT 229 09/30/2023      Component Value Date/Time   NA 141 05/21/2024 0726   K 5.0 05/21/2024 0726   CL 101 05/21/2024 0726   CO2 24 05/21/2024 0726   GLUCOSE 120 (H) 05/21/2024 0726   GLUCOSE 103 (H) 03/17/2016 0312   GLUCOSE 154 04/01/2009 0000   BUN 17 05/21/2024 0726   CREATININE 0.98 05/21/2024 0726   CALCIUM  10.4 (H) 05/21/2024 0726   PROT 6.9 05/21/2024 0726   ALBUMIN  4.9 05/21/2024 0726   AST 20 05/21/2024 0726   ALT 15 05/21/2024 0726   ALKPHOS 56 05/21/2024 0726   BILITOT 0.5 05/21/2024 0726   GFRNONAA 80 07/25/2020 0910   GFRAA 93 07/25/2020 0910   Lab Results  Component Value Date   CHOL 162  05/21/2024   HDL 49 05/21/2024   LDLCALC 87 05/21/2024   TRIG 147 05/21/2024   CHOLHDL 3.3 05/21/2024   Lab Results  Component Value Date   HGBA1C 6.1 (H) 05/21/2024   Lab Results  Component Value Date   VITAMINB12 697 04/03/2021   Lab Results  Component Value Date   TSH 1.220 05/21/2024    Lauraine Born, AGNP-C, DNP 07/04/2024, 8:28 AM [facility] [address] [place], [place] [address] [phone]     [1] No Known Allergies  "

## 2024-09-14 ENCOUNTER — Ambulatory Visit: Admitting: Endocrinology

## 2024-10-05 ENCOUNTER — Encounter: Admitting: Internal Medicine

## 2025-07-10 ENCOUNTER — Telehealth: Admitting: Neurology
